# Patient Record
Sex: Male | Born: 1945 | Race: Black or African American | Hispanic: No | Marital: Married | Smoking: Former smoker
Health system: Southern US, Community
[De-identification: ages and names within clinical notes are randomized; demographics above are authoritative.]

## PROBLEM LIST (undated history)

## (undated) DIAGNOSIS — I5189 Other ill-defined heart diseases: Secondary | ICD-10-CM

## (undated) DIAGNOSIS — I4891 Unspecified atrial fibrillation: Secondary | ICD-10-CM

## (undated) DIAGNOSIS — I440 Atrioventricular block, first degree: Secondary | ICD-10-CM

## (undated) DIAGNOSIS — I639 Cerebral infarction, unspecified: Secondary | ICD-10-CM

## (undated) DIAGNOSIS — I10 Essential (primary) hypertension: Secondary | ICD-10-CM

## (undated) DIAGNOSIS — R001 Bradycardia, unspecified: Secondary | ICD-10-CM

## (undated) DIAGNOSIS — I4510 Unspecified right bundle-branch block: Secondary | ICD-10-CM

## (undated) HISTORY — PX: FOOT SURGERY: SHX648

## (undated) HISTORY — PX: COLONOSCOPY: SHX174

## (undated) HISTORY — DX: Cerebral infarction, unspecified: I63.9

## (undated) HISTORY — DX: Other ill-defined heart diseases: I51.89

## (undated) HISTORY — DX: Atrioventricular block, first degree: I44.0

## (undated) HISTORY — DX: Unspecified atrial fibrillation: I48.91

## (undated) HISTORY — DX: Bradycardia, unspecified: R00.1

## (undated) HISTORY — DX: Unspecified right bundle-branch block: I45.10

## (undated) HISTORY — DX: Essential (primary) hypertension: I10

---

## 2002-05-22 ENCOUNTER — Ambulatory Visit (HOSPITAL_COMMUNITY): Admission: RE | Admit: 2002-05-22 | Discharge: 2002-05-22 | Payer: Self-pay | Admitting: Family Medicine

## 2002-05-22 ENCOUNTER — Encounter: Payer: Self-pay | Admitting: Family Medicine

## 2002-05-31 ENCOUNTER — Encounter: Payer: Self-pay | Admitting: Family Medicine

## 2002-05-31 ENCOUNTER — Ambulatory Visit (HOSPITAL_COMMUNITY): Admission: RE | Admit: 2002-05-31 | Discharge: 2002-05-31 | Payer: Self-pay | Admitting: Family Medicine

## 2003-08-28 ENCOUNTER — Ambulatory Visit (HOSPITAL_COMMUNITY): Admission: RE | Admit: 2003-08-28 | Discharge: 2003-08-28 | Payer: Self-pay | Admitting: Internal Medicine

## 2003-09-06 ENCOUNTER — Ambulatory Visit (HOSPITAL_COMMUNITY): Admission: RE | Admit: 2003-09-06 | Discharge: 2003-09-06 | Payer: Self-pay | Admitting: Internal Medicine

## 2003-09-12 ENCOUNTER — Ambulatory Visit (HOSPITAL_COMMUNITY): Admission: RE | Admit: 2003-09-12 | Discharge: 2003-09-12 | Payer: Self-pay | Admitting: Internal Medicine

## 2011-03-03 ENCOUNTER — Other Ambulatory Visit (HOSPITAL_COMMUNITY): Payer: Self-pay | Admitting: Family Medicine

## 2011-03-03 ENCOUNTER — Ambulatory Visit (HOSPITAL_COMMUNITY)
Admission: RE | Admit: 2011-03-03 | Discharge: 2011-03-03 | Disposition: A | Discharge status: Home / Self Care | Payer: BC Managed Care – PPO | Source: Ambulatory Visit | Attending: Family Medicine | Admitting: Family Medicine

## 2011-03-03 DIAGNOSIS — M542 Cervicalgia: Secondary | ICD-10-CM

## 2011-03-06 ENCOUNTER — Other Ambulatory Visit (HOSPITAL_COMMUNITY): Payer: Self-pay | Admitting: Family Medicine

## 2011-03-06 DIAGNOSIS — R200 Anesthesia of skin: Secondary | ICD-10-CM

## 2011-03-09 ENCOUNTER — Ambulatory Visit (HOSPITAL_COMMUNITY)
Admission: RE | Admit: 2011-03-09 | Discharge: 2011-03-09 | Disposition: A | Discharge status: Home / Self Care | Payer: BC Managed Care – PPO | Source: Ambulatory Visit | Attending: Family Medicine | Admitting: Family Medicine

## 2011-03-09 DIAGNOSIS — R209 Unspecified disturbances of skin sensation: Secondary | ICD-10-CM | POA: Insufficient documentation

## 2011-03-09 DIAGNOSIS — M6281 Muscle weakness (generalized): Secondary | ICD-10-CM | POA: Insufficient documentation

## 2011-03-09 DIAGNOSIS — R200 Anesthesia of skin: Secondary | ICD-10-CM

## 2011-03-09 LAB — CREATININE, SERUM
Creatinine, Ser: 1.2 mg/dL (ref 0.50–1.35)
GFR calc Af Amer: >60 mL/min (ref >60)
GFR calc non Af Amer: >60 mL/min (ref >60)

## 2011-03-09 MED ORDER — GADOBENATE DIMEGLUMINE 529 MG/ML IV SOLN
20.0000 mL | Freq: Once | INTRAVENOUS | Status: AC | PRN
  Administered 2011-03-09: 20 mL via INTRAVENOUS

## 2011-09-11 LAB — HEPATIC FUNCTION PANEL: AST: 24 U/L

## 2011-10-12 DIAGNOSIS — I5189 Other ill-defined heart diseases: Secondary | ICD-10-CM

## 2011-10-12 HISTORY — DX: Other ill-defined heart diseases: I51.89

## 2011-10-22 LAB — BASIC METABOLIC PANEL
Chloride: 106 mmol/L
Creat: 0.97
Hemoglobin A - HGBRFX: 97.4
Hemoglobin A2 - HGBRFX: 2.6
Potassium: 4.2 mmol/L

## 2011-10-22 LAB — CBC WITH DIFFERENTIAL/PLATELET
%SAT: 21
TIBC: 289
platelet count: 224

## 2011-12-29 ENCOUNTER — Ambulatory Visit: Payer: BC Managed Care – PPO | Admitting: Internal Medicine

## 2011-12-29 ENCOUNTER — Encounter: Payer: Self-pay | Admitting: Urgent Care

## 2011-12-29 ENCOUNTER — Ambulatory Visit (INDEPENDENT_AMBULATORY_CARE_PROVIDER_SITE_OTHER): Payer: BC Managed Care – PPO | Admitting: Urgent Care

## 2011-12-29 VITALS — BP 128/72 | HR 66 | Temp 98.3°F | Ht 69.0 in | Wt 233.6 lb

## 2011-12-29 DIAGNOSIS — K921 Melena: Secondary | ICD-10-CM

## 2011-12-29 MED ORDER — PEG 3350-KCL-NA BICARB-NACL 420 G PO SOLR: ORAL | Status: AC

## 2011-12-29 NOTE — Patient Instructions (Addendum)
Colonoscopy w/ Dr Jena Gauss To ER if severe bleeding or pain  No Antiinflammatories, or BC powders, Goodys, Ibuprofen, aleve, advil, motrin or aspirin products  Rectal Bleeding Rectal bleeding is when blood passes out of the anus. It is usually a sign that something is wrong. It may not be serious, but it should always be evaluated. Rectal bleeding may present as bright red blood or extremely dark stools. The color may range from dark red or maroon to black (like tar). It is important that the cause of rectal bleeding be identified so treatment can be started and the problem corrected. CAUSES   Hemorrhoids. These are enlarged (dilated) blood vessels or veins in the anal or rectal area.   Fistulas. Theseare abnormal, burrowing channels that usually run from inside the rectum to the skin around the anus. They can bleed.   Anal fissures. This is a tear in the tissue of the anus. Bleeding occurs with bowel movements.   Diverticulosis. This is a condition in which pockets or sacs project from the bowel wall. Occasionally, the sacs can bleed.   Diverticulitis. Thisis an infection involving diverticulosis of the colon.   Proctitis and colitis. These are conditions in which the rectum, colon, or both, can become inflamed and pitted (ulcerated).   Polyps and cancer. Polyps are non-cancerous (benign) growths in the colon that may bleed. Certain types of polyps turn into cancer.   Protrusion of the rectum. Part of the rectum can project from the anus and bleed.   Certain medicines.   Intestinal infections.   Blood vessel abnormalities.  HOME CARE INSTRUCTIONS  Eat a high-fiber diet to keep your stool soft.   Limit activity.   Drink enough fluids to keep your urine clear or pale yellow.   Warm baths may be useful to soothe rectal pain.   Follow up with your caregiver as directed.  SEEK IMMEDIATE MEDICAL CARE IF:  You develop increased bleeding.   You have black or dark red stools.    You vomit blood or material that looks like coffee grounds.   You have abdominal pain or tenderness.   You have a fever.   You feel weak, nauseous, or you faint.   You have severe rectal pain or you are unable to have a bowel movement.  MAKE SURE YOU:  Understand these instructions.   Will watch your condition.   Will get help right away if you are not doing well or get worse.  Document Released: 12/19/2001 Document Revised: 06/18/2011 Document Reviewed: 12/14/2010 [facility] Patient Information 2012 [place], [place].

## 2011-12-29 NOTE — Assessment & Plan Note (Addendum)
Joseph [name] is a pleasant 66 y.o. male with acute onset of abdominal cramps and hematochezia. Symptoms are suspicious for ischemic colitis. He has also been taking anti-inflammatories in the way of ibuprofen the last week and therefore he could have NSAID-induced bleeding as well. Benign anorectal bleeding, diverticular bleeding, colon polyps & carcinoma remain in the differential as well.  Colonoscopy for further evaluation your future with Dr. Jena Gauss.  I have discussed risks & benefits which include, but are not limited to, bleeding, infection, perforation & drug reaction.  The patient agrees with this plan & written consent will be obtained.    CBC now To ER if severe bleeding or pain  No Antiinflammatories, or BC powders, Goodys, Ibuprofen, aleve, advil, motrin or aspirin products

## 2011-12-29 NOTE — Progress Notes (Signed)
Referring Provider: Dr. Thomas Kelly (Cardiologist) Primary Care Physician:  KNOWLTON,STEPHEN D, MD Primary Gastroenterologist:  Dr. Rourk  Chief Complaint  Patient presents with  . Rectal Bleeding    HPI:  Joseph [name] is a 65 y.o. male here as a referral from Dr. Kelly for hematochezia & abdominal pain.   He reports 2 days ago ,he began to have "gas & griping cramps in his stomach".  C/o lots of gas, then he went to the bathroom & noticed a pink tinge to his semi-formed stool.  Yesterday, he looked at tissue after small amt of brown stool & saw a moderate amount of bright red blood on tissue.  He noticed this again this AM.  He had pain in his lower abdomen, grumbling & growling.  4/10 at worst.  Now 0/10.  Pain is intermittent.  Looser BMs earlier, now feels constipated.  He is been taking 200 mg of ibuprofen approximately 8 pills in the last week.   Denies any upper GI symptoms including heartburn, indigestion, nausea, vomiting, dysphagia, odynophagia or anorexia. Last colonoscopy was in 2005 and report is unavailable at this time.  Labs from 10/22/11 show normal iron, TIBC, TIBC, percent saturation, CBC, and ferritin.  TSH is normal.  Past Medical History  Diagnosis Date  . Hypertension     No past surgical history on file.  Current Outpatient Prescriptions  Medication Sig Dispense Refill  . amLODipine (NORVASC) 5 MG tablet Take 5 mg by mouth daily.      . LORazepam (ATIVAN) 1 MG tablet Take 1 mg by mouth every 8 (eight) hours.      . losartan-hydrochlorothiazide (HYZAAR) 50-12.5 MG per tablet Take 1 tablet by mouth daily.      . saw palmetto 160 MG capsule Take 160 mg by mouth 2 (two) times daily.      . tadalafil (CIALIS) 20 MG tablet Take 20 mg by mouth as needed.      . polyethylene glycol-electrolytes (TRILYTE) 420 G solution Use as directed Also buy 1 fleet enema & 4 dulcolax tablets to use as directed  4000 mL  0    Allergies as of 12/29/2011  . (No Known Allergies)     Family history:There is no known family history of colorectal carcinoma , liver disease, or inflammatory bowel disease.  History   Social History  . Marital Status: Single    Spouse Name: N/A    Number of Children: N/A  . Years of Education: N/A   Occupational History  . Not on file.   Social History Main Topics  . Smoking status: Former Smoker -- 0.5 packs/day    Types: Cigarettes  . Smokeless tobacco: Not on file   Comment: quit about 40 yrs ago  . Alcohol Use: Yes     6 pack of beer in a week  . Drug Use: No  . Sexually Active: Not on file  Review of Systems: Gen: Denies any fever, chills, sweats, anorexia, fatigue, weakness, malaise, weight loss, and sleep disorder CV: Denies chest pain, angina, palpitations, syncope, orthopnea, PND, peripheral edema, and claudication. Resp: Denies dyspnea at rest, dyspnea with exercise, cough, sputum, wheezing, coughing up blood, and pleurisy. GI: Denies vomiting blood, jaundice, and fecal incontinence.    GU : Denies urinary burning, blood in urine, urinary frequency, urinary hesitancy, nocturnal urination, and urinary incontinence. MS: Denies joint pain, limitation of movement, and swelling, stiffness, low back pain, extremity pain. Denies muscle weakness, cramps, atrophy.  Derm: Denies rash, itching,   dry skin, hives, moles, warts, or unhealing ulcers.  Psych: Denies depression, anxiety, memory loss, suicidal ideation, hallucinations, paranoia, and confusion. Heme: Denies bruising and enlarged lymph nodes. Neuro:  Denies any headaches, dizziness, paresthesias. Endo:  Denies any problems with DM, thyroid, adrenal function.  Physical Exam: BP 128/72  Pulse 66  Temp 98.3 F (36.8 C) (Temporal)  Ht 5' 9" (1.753 m)  Wt 233 lb 9.6 oz (105.96 kg)  BMI 34.50 kg/m2 General:   Alert,  Well-developed, well-nourished, pleasant and cooperative in NAD. Head:  Normocephalic and atraumatic. Eyes:  Sclera clear, no icterus.   Conjunctiva  pink. Ears:  Normal auditory acuity. Nose:  No deformity, discharge, or lesions. Mouth:  No deformity or lesions,oropharynx pink & moist. Neck:  Supple; no masses or thyromegaly. Lungs:  Clear throughout to auscultation.   No wheezes, crackles, or rhonchi. No acute distress. Heart:  Regular rate and rhythm; no murmurs, clicks, rubs,  or gallops. Abdomen:  Normal bowel sounds.  No bruits.  Soft, non-tender and non-distended without masses, hepatosplenomegaly or hernias noted.  No guarding or rebound tenderness.   Rectal:  No internal or external lesions noted. Msk:  Symmetrical without gross deformities. Normal posture. Pulses:  Normal pulses noted. Extremities:  No clubbing or edema. Neurologic:  Alert and oriented x4;  grossly normal neurologically. Skin:  Intact without significant lesions or rashes. Lymph Nodes:  No significant cervical adenopathy. Psych:  Alert and cooperative. Normal mood and affect.  

## 2011-12-30 ENCOUNTER — Encounter (HOSPITAL_COMMUNITY)
Admission: RE | Disposition: A | Discharge status: Home / Self Care | Payer: Self-pay | Source: Ambulatory Visit | Attending: Internal Medicine

## 2011-12-30 ENCOUNTER — Encounter (HOSPITAL_COMMUNITY): Payer: Self-pay | Admitting: *Deleted

## 2011-12-30 ENCOUNTER — Ambulatory Visit (HOSPITAL_COMMUNITY)
Admission: RE | Admit: 2011-12-30 | Discharge: 2011-12-30 | Disposition: A | Discharge status: Home / Self Care | Payer: BC Managed Care – PPO | Source: Ambulatory Visit | Attending: Internal Medicine | Admitting: Internal Medicine

## 2011-12-30 DIAGNOSIS — R109 Unspecified abdominal pain: Secondary | ICD-10-CM | POA: Insufficient documentation

## 2011-12-30 DIAGNOSIS — K5730 Diverticulosis of large intestine without perforation or abscess without bleeding: Secondary | ICD-10-CM

## 2011-12-30 DIAGNOSIS — K921 Melena: Secondary | ICD-10-CM | POA: Insufficient documentation

## 2011-12-30 DIAGNOSIS — K5289 Other specified noninfective gastroenteritis and colitis: Secondary | ICD-10-CM | POA: Insufficient documentation

## 2011-12-30 DIAGNOSIS — I10 Essential (primary) hypertension: Secondary | ICD-10-CM | POA: Insufficient documentation

## 2011-12-30 DIAGNOSIS — D126 Benign neoplasm of colon, unspecified: Secondary | ICD-10-CM

## 2011-12-30 DIAGNOSIS — Z79899 Other long term (current) drug therapy: Secondary | ICD-10-CM | POA: Insufficient documentation

## 2011-12-30 HISTORY — PX: COLONOSCOPY: SHX5424

## 2011-12-30 LAB — CBC WITH DIFFERENTIAL/PLATELET
Basophils Absolute: 0 10*3/uL (ref 0.0–0.1)
Eosinophils Absolute: 0.2 10*3/uL (ref 0.0–0.7)
Eosinophils Relative: 2 % (ref 0–5)
MCH: 21.2 pg — ABNORMAL LOW (ref 26.0–34.0)
MCHC: 32.6 g/dL (ref 30.0–36.0)
MCV: 65 fL — ABNORMAL LOW (ref 78.0–100.0)
Platelets: 256 10*3/uL (ref 150–400)
RDW: 15.4 % (ref 11.5–15.5)

## 2011-12-30 SURGERY — COLONOSCOPY
Anesthesia: Moderate Sedation

## 2011-12-30 MED ORDER — MEPERIDINE HCL 100 MG/ML IJ SOLN
INTRAMUSCULAR | Status: AC
  Filled 2011-12-30: qty 2

## 2011-12-30 MED ORDER — SODIUM CHLORIDE 0.45 % IV SOLN: Freq: Once | INTRAVENOUS | Status: DC

## 2011-12-30 MED ORDER — MEPERIDINE HCL 100 MG/ML IJ SOLN
INTRAMUSCULAR | Status: DC | PRN
  Administered 2011-12-30: 25 mg via INTRAVENOUS
  Administered 2011-12-30: 50 mg via INTRAVENOUS

## 2011-12-30 MED ORDER — MIDAZOLAM HCL 5 MG/5ML IJ SOLN
INTRAMUSCULAR | Status: AC
  Filled 2011-12-30: qty 10

## 2011-12-30 MED ORDER — MIDAZOLAM HCL 5 MG/5ML IJ SOLN
INTRAMUSCULAR | Status: DC | PRN
  Administered 2011-12-30: 1 mg via INTRAVENOUS
  Administered 2011-12-30 (×2): 2 mg via INTRAVENOUS

## 2011-12-30 NOTE — Discharge Instructions (Addendum)
Colonoscopy Discharge Instructions  Read the instructions outlined below and refer to this sheet in the next few weeks. These discharge instructions provide you with general information on caring for yourself after you leave the hospital. Your doctor may also give you specific instructions. While your treatment has been planned according to the most current medical practices available, unavoidable complications occasionally occur. If you have any problems or questions after discharge, call Dr. Jena Gauss at [phone]. ACTIVITY  You may resume your regular activity, but move at a slower pace for the next 24 hours.   Take frequent rest periods for the next 24 hours.   Walking will help get rid of the air and reduce the bloated feeling in your belly (abdomen).   No driving for 24 hours (because of the medicine (anesthesia) used during the test).    Do not sign any important legal documents or operate any machinery for 24 hours (because of the anesthesia used during the test).  NUTRITION  Drink plenty of fluids.   You may resume your normal diet as instructed by your doctor.   Begin with a light meal and progress to your normal diet. Heavy or fried foods are harder to digest and may make you feel sick to your stomach (nauseated).   Avoid alcoholic beverages for 24 hours or as instructed.  MEDICATIONS  You may resume your normal medications unless your doctor tells you otherwise.  WHAT YOU CAN EXPECT TODAY  Some feelings of bloating in the abdomen.   Passage of more gas than usual.   Spotting of blood in your stool or on the toilet paper.  IF YOU HAD POLYPS REMOVED DURING THE COLONOSCOPY:  No aspirin products for 7 days or as instructed.   No alcohol for 7 days or as instructed.   Eat a soft diet for the next 24 hours.  FINDING OUT THE RESULTS OF YOUR TEST Not all test results are available during your visit. If your test results are not back during the visit, make an appointment  with your caregiver to find out the results. Do not assume everything is normal if you have not heard from your caregiver or the medical facility. It is important for you to follow up on all of your test results.  SEEK IMMEDIATE MEDICAL ATTENTION IF:  You have more than a spotting of blood in your stool.   Your belly is swollen (abdominal distention).   You are nauseated or vomiting.   You have a temperature over 101.   You have abdominal pain or discomfort that is severe or gets worse throughout the day.    Polyp information provided.  You have ischemic colitis. This should resolve without anything else needing to be done.  Diverticulosis information provided.  Further recommendations to follow pending review of pathology report.  Colon Polyps A polyp is extra tissue that grows inside your body. Colon polyps grow in the large intestine. The large intestine, also called the colon, is part of your digestive system. It is a long, hollow tube at the end of your digestive tract where your body makes and stores stool. Most polyps are not dangerous. They are benign. This means they are not cancerous. But over time, some types of polyps can turn into cancer. Polyps that are smaller than a pea are usually not harmful. But larger polyps could someday become or may already be cancerous. To be safe, doctors remove all polyps and test them.  WHO GETS POLYPS? Anyone can get polyps,  but certain people are more likely than others. You may have a greater chance of getting polyps if:  You are over 50.   You have had polyps before.   Someone in your family has had polyps.   Someone in your family has had cancer of the large intestine.   Find out if someone in your family has had polyps. You may also be more likely to get polyps if you:   Eat a lot of fatty foods.   Smoke.   Drink alcohol.   Do not exercise.   Eat too much.  SYMPTOMS  Most small polyps do not cause symptoms. People often  do not know they have one until their caregiver finds it during a regular checkup or while testing them for something else. Some people do have symptoms like these:  Bleeding from the anus. You might notice blood on your underwear or on toilet paper after you have had a bowel movement.   Constipation or diarrhea that lasts more than a week.   Blood in the stool. Blood can make stool look black or it can show up as red streaks in the stool.  If you have any of these symptoms, see your caregiver. HOW DOES THE DOCTOR TEST FOR POLYPS? The doctor can use four tests to check for polyps:  Digital rectal exam. The caregiver wears gloves and checks your rectum (the last part of the large intestine) to see if it feels normal. This test would find polyps only in the rectum. Your caregiver may need to do one of the other tests listed below to find polyps higher up in the intestine.   Barium enema. The caregiver puts a liquid called barium into your rectum before taking x-rays of your large intestine. Barium makes your intestine look white in the pictures. Polyps are dark, so they are easy to see.   Sigmoidoscopy. With this test, the caregiver can see inside your large intestine. A thin flexible tube is placed into your rectum. The device is called a sigmoidoscope, which has a light and a tiny video camera in it. The caregiver uses the sigmoidoscope to look at the last third of your large intestine.   Colonoscopy. This test is like sigmoidoscopy, but the caregiver looks at all of the large intestine. It usually requires sedation. This is the most common method for finding and removing polyps.  TREATMENT   The caregiver will remove the polyp during sigmoidoscopy or colonoscopy. The polyp is then tested for cancer.   If you have had polyps, your caregiver may want you to get tested regularly in the future.  PREVENTION  There is not one sure way to prevent polyps. You might be able to lower your risk of  getting them if you:  Eat more fruits and vegetables and less fatty food.   Do not smoke.   Avoid alcohol.   Exercise every day.   Lose weight if you are overweight.   Eating more calcium and folate can also lower your risk of getting polyps. Some foods that are rich in calcium are milk, cheese, and broccoli. Some foods that are rich in folate are chickpeas, kidney beans, and spinach.   Aspirin might help prevent polyps. Studies are under way.  Document Released: 03/25/2004 Document Revised: 06/18/2011 Document Reviewed: 08/31/2007 [facility] Patient Information 2012 [place], [place].  Diverticulosis Diverticulosis is a common condition that develops when small pouches (diverticula) form in the wall of the colon. The risk of diverticulosis increases with  age. It happens more often in people who eat a low-fiber diet. Most individuals with diverticulosis have no symptoms. Those individuals with symptoms usually experience abdominal pain, constipation, or loose stools (diarrhea). HOME CARE INSTRUCTIONS   Increase the amount of fiber in your diet as directed by your caregiver or dietician. This may reduce symptoms of diverticulosis.   Your caregiver may recommend taking a dietary fiber supplement.   Drink at least 6 to 8 glasses of water each day to prevent constipation.   Try not to strain when you have a bowel movement.   Your caregiver may recommend avoiding nuts and seeds to prevent complications, although this is still an uncertain benefit.   Only take over-the-counter or prescription medicines for pain, discomfort, or fever as directed by your caregiver.  FOODS WITH HIGH FIBER CONTENT INCLUDE:  Fruits. Apple, peach, pear, tangerine, raisins, prunes.   Vegetables. Brussels sprouts, asparagus, broccoli, cabbage, carrot, cauliflower, romaine lettuce, spinach, summer squash, tomato, winter squash, zucchini.   Starchy Vegetables. Baked beans, kidney beans, lima beans, split peas,  lentils, potatoes (with skin).   Grains. Whole wheat bread, brown rice, bran flake cereal, plain oatmeal, white rice, shredded wheat, bran muffins.  SEEK IMMEDIATE MEDICAL CARE IF:   You develop increasing pain or severe bloating.   You have an oral temperature above 102 F (38.9 C), not controlled by medicine.   You develop vomiting or bowel movements that are bloody or black.  Document Released: 03/26/2004 Document Revised: 06/18/2011 Document Reviewed: 11/27/2009 [facility] Patient Information 2012 [place], [place].

## 2011-12-30 NOTE — H&P (View-Only) (Signed)
Referring Provider: Dr. Nicki Guadalajara (Cardiologist) Primary Care Physician:  Milana Obey, MD Primary Gastroenterologist:  Dr. Jena Gauss  Chief Complaint  Patient presents with  . Rectal Bleeding    HPI:  Joseph [name] is a 66 y.o. male here as a referral from Dr. Tresa Endo for hematochezia & abdominal pain.   He reports 2 days ago ,he began to have "gas & griping cramps in his stomach".  C/o lots of gas, then he went to the bathroom & noticed a pink tinge to his semi-formed stool.  Yesterday, he looked at tissue after small amt of brown stool & saw a moderate amount of bright red blood on tissue.  He noticed this again this AM.  He had pain in his lower abdomen, grumbling & growling.  4/10 at worst.  Now 0/10.  Pain is intermittent.  Looser BMs earlier, now feels constipated.  He is been taking 200 mg of ibuprofen approximately 8 pills in the last week.   Denies any upper GI symptoms including heartburn, indigestion, nausea, vomiting, dysphagia, odynophagia or anorexia. Last colonoscopy was in 2005 and report is unavailable at this time.  Labs from 10/22/11 show normal iron, TIBC, TIBC, percent saturation, CBC, and ferritin.  TSH is normal.  Past Medical History  Diagnosis Date  . Hypertension     No past surgical history on file.  Current Outpatient Prescriptions  Medication Sig Dispense Refill  . amLODipine (NORVASC) 5 MG tablet Take 5 mg by mouth daily.      Marland Kitchen LORazepam (ATIVAN) 1 MG tablet Take 1 mg by mouth every 8 (eight) hours.      Marland Kitchen losartan-hydrochlorothiazide (HYZAAR) 50-12.5 MG per tablet Take 1 tablet by mouth daily.      . saw palmetto 160 MG capsule Take 160 mg by mouth 2 (two) times daily.      . tadalafil (CIALIS) 20 MG tablet Take 20 mg by mouth as needed.      . polyethylene glycol-electrolytes (TRILYTE) 420 G solution Use as directed Also buy 1 fleet enema & 4 dulcolax tablets to use as directed  4000 mL  0    Allergies as of 12/29/2011  . (No Known Allergies)     Family history:There is no known family history of colorectal carcinoma , liver disease, or inflammatory bowel disease.  History   Social History  . Marital Status: Single    Spouse Name: N/A    Number of Children: N/A  . Years of Education: N/A   Occupational History  . Not on file.   Social History Main Topics  . Smoking status: Former Smoker -- 0.5 packs/day    Types: Cigarettes  . Smokeless tobacco: Not on file   Comment: quit about 40 yrs ago  . Alcohol Use: Yes     6 pack of beer in a week  . Drug Use: No  . Sexually Active: Not on file  Review of Systems: Gen: Denies any fever, chills, sweats, anorexia, fatigue, weakness, malaise, weight loss, and sleep disorder CV: Denies chest pain, angina, palpitations, syncope, orthopnea, PND, peripheral edema, and claudication. Resp: Denies dyspnea at rest, dyspnea with exercise, cough, sputum, wheezing, coughing up blood, and pleurisy. GI: Denies vomiting blood, jaundice, and fecal incontinence.    GU : Denies urinary burning, blood in urine, urinary frequency, urinary hesitancy, nocturnal urination, and urinary incontinence. MS: Denies joint pain, limitation of movement, and swelling, stiffness, low back pain, extremity pain. Denies muscle weakness, cramps, atrophy.  Derm: Denies rash, itching,  dry skin, hives, moles, warts, or unhealing ulcers.  Psych: Denies depression, anxiety, memory loss, suicidal ideation, hallucinations, paranoia, and confusion. Heme: Denies bruising and enlarged lymph nodes. Neuro:  Denies any headaches, dizziness, paresthesias. Endo:  Denies any problems with DM, thyroid, adrenal function.  Physical Exam: BP 128/72  Pulse 66  Temp 98.3 F (36.8 C) (Temporal)  Ht 5\' 9"  (1.753 m)  Wt 233 lb 9.6 oz (105.96 kg)  BMI 34.50 kg/m2 General:   Alert,  Well-developed, well-nourished, pleasant and cooperative in NAD. Head:  Normocephalic and atraumatic. Eyes:  Sclera clear, no icterus.   Conjunctiva  pink. Ears:  Normal auditory acuity. Nose:  No deformity, discharge, or lesions. Mouth:  No deformity or lesions,oropharynx pink & moist. Neck:  Supple; no masses or thyromegaly. Lungs:  Clear throughout to auscultation.   No wheezes, crackles, or rhonchi. No acute distress. Heart:  Regular rate and rhythm; no murmurs, clicks, rubs,  or gallops. Abdomen:  Normal bowel sounds.  No bruits.  Soft, non-tender and non-distended without masses, hepatosplenomegaly or hernias noted.  No guarding or rebound tenderness.   Rectal:  No internal or external lesions noted. Msk:  Symmetrical without gross deformities. Normal posture. Pulses:  Normal pulses noted. Extremities:  No clubbing or edema. Neurologic:  Alert and oriented x4;  grossly normal neurologically. Skin:  Intact without significant lesions or rashes. Lymph Nodes:  No significant cervical adenopathy. Psych:  Alert and cooperative. Normal mood and affect.

## 2011-12-30 NOTE — Interval H&P Note (Signed)
History and Physical Interval Note:  12/30/2011 2:07 PM  Joseph [name]  has presented today for surgery, with the diagnosis of HEMATOCHEZIA,ABD CRAMPS  The various methods of treatment have been discussed with the patient and family. After consideration of risks, benefits and other options for treatment, the patient has consented to  Procedure(s) (LRB): COLONOSCOPY (N/A) as a surgical intervention .  The patient's history has been reviewed, patient examined, no change in status, stable for surgery.  I have reviewed the patients' chart and labs.  Questions were answered to the patient's satisfaction.     [name]

## 2011-12-30 NOTE — Op Note (Signed)
[facility], [place] [address] [place], [place]  [address]  COLONOSCOPY PROCEDURE REPORT  PATIENT:  Joseph [name], Joseph [name]  MR#:  [id number] BIRTHDATE:  03-14-1946, 65 yrs. old  GENDER:  male ENDOSCOPIST:  R. Roetta Sessions, MD FACP [facility] REF. BY:  Lennette Bihari, M.D. Gareth Morgan, M.D. PROCEDURE DATE:  12/30/2011 PROCEDURE:  Colonoscopy with snare polypectomy and biopsy  INDICATIONS:  Hematochezia- now resolved:  H&H 12.0 and 36.8 yesterday. MCV 65. Iron studies normal in April of this year  INFORMED CONSENT:  The risks, benefits, alternatives and imponderables including but not limited to bleeding, perforation as well as the possibility of a missed lesion have been reviewed. The potential for biopsy, lesion removal, etc. have also been discussed.  Questions have been answered.  All parties agreeable. Please see the history and physical in the medical record for more information.  MEDICATIONS:  Versed 5 mg IV and Demerol 75 mg IV in divided doses.  DESCRIPTION OF PROCEDURE:  After a digital rectal exam was performed, the EC-[id number] ([id number]) colonoscope was advanced from the anus through the rectum and colon to the area of the cecum, ileocecal valve and appendiceal orifice.  The cecum was deeply intubated.  These structures were well-seen and photographed for the record.  From the level of the cecum and ileocecal valve, the scope was slowly and cautiously withdrawn.  The mucosal surfaces were carefully surveyed utilizing scope tip deflection to facilitate fold flattening as needed.  The scope was pulled down into the rectum where a thorough examination including retroflexion was performed. <<PROCEDUREIMAGES>>  FINDINGS: Adequate preparation. Normal rectum. Pancolonic diverticulosis. 20 cm band of markedly inflamed thrombotic ulcerated/eroded colonic mucosa involving the descending segment. See image 7. Remainder of colonic mucosa appeared entirely normal aside from a 6 mm  polyp in the base of the cecum THERAPEUTIC / DIAGNOSTIC MANEUVERS PERFORMED:  The abnormal descending segment was biopsied for histologic study. The cecal polyp was cold snared removed for the pathologist.  COMPLICATIONS:  None  CECAL WITHDRAWAL TIME: None  IMPRESSION:  Ischemic or segmental colitis-status post biopsy. Pancolonic diverticulosis. Cecal polyp-status post polypectomy  RECOMMENDATIONS:  Follow up on pathology. Recent acute illness should be a self-limiting phenomenon. If he has recurrent ischemic colitis,  then a study of his mesenteric vasculature would be need.  ______________________________ R. Roetta Sessions, MD Caleen Essex  CC:  Lennette Bihari, M.D. Gareth Morgan, M.D.  n. eSIGNED:   R. Roetta Sessions at 12/30/2011 02:44 PM  Florina Ou, [phone]

## 2011-12-31 NOTE — Progress Notes (Signed)
Quick Note:  Please call pt & let him know that his blood count is stable but still a bit low. Thanks EA:[id number] D, MD & Dr Tresa Endo  ______

## 2011-12-31 NOTE — Progress Notes (Signed)
Faxed to PCP

## 2012-01-04 ENCOUNTER — Encounter (HOSPITAL_COMMUNITY): Payer: Self-pay | Admitting: Internal Medicine

## 2012-01-05 ENCOUNTER — Encounter: Payer: Self-pay | Admitting: Internal Medicine

## 2012-07-15 ENCOUNTER — Other Ambulatory Visit (HOSPITAL_COMMUNITY): Payer: Self-pay | Admitting: Cardiovascular Disease

## 2012-07-15 DIAGNOSIS — I119 Hypertensive heart disease without heart failure: Secondary | ICD-10-CM

## 2012-07-18 ENCOUNTER — Ambulatory Visit (HOSPITAL_COMMUNITY)
Admission: RE | Admit: 2012-07-18 | Discharge: 2012-07-18 | Disposition: A | Discharge status: Home / Self Care | Payer: BC Managed Care – PPO | Source: Ambulatory Visit | Attending: Cardiovascular Disease | Admitting: Cardiovascular Disease

## 2012-07-18 DIAGNOSIS — I119 Hypertensive heart disease without heart failure: Secondary | ICD-10-CM | POA: Insufficient documentation

## 2012-07-18 NOTE — Progress Notes (Signed)
Renal duplex completed.  

## 2012-08-12 ENCOUNTER — Other Ambulatory Visit (HOSPITAL_COMMUNITY): Payer: Self-pay | Admitting: Cardiovascular Disease

## 2012-08-12 DIAGNOSIS — N281 Cyst of kidney, acquired: Secondary | ICD-10-CM

## 2012-08-16 ENCOUNTER — Ambulatory Visit (HOSPITAL_COMMUNITY)
Admission: RE | Admit: 2012-08-16 | Discharge: 2012-08-16 | Disposition: A | Discharge status: Home / Self Care | Payer: BC Managed Care – PPO | Source: Ambulatory Visit | Attending: Cardiovascular Disease | Admitting: Cardiovascular Disease

## 2012-08-16 ENCOUNTER — Other Ambulatory Visit (HOSPITAL_COMMUNITY): Payer: Self-pay | Admitting: Cardiovascular Disease

## 2012-08-16 DIAGNOSIS — Q619 Cystic kidney disease, unspecified: Secondary | ICD-10-CM | POA: Insufficient documentation

## 2012-08-16 DIAGNOSIS — N281 Cyst of kidney, acquired: Secondary | ICD-10-CM

## 2012-08-16 DIAGNOSIS — Z09 Encounter for follow-up examination after completed treatment for conditions other than malignant neoplasm: Secondary | ICD-10-CM | POA: Insufficient documentation

## 2012-08-16 MED ORDER — GADOBENATE DIMEGLUMINE 529 MG/ML IV SOLN
20.0000 mL | Freq: Once | INTRAVENOUS | Status: AC | PRN
  Administered 2012-08-16: 20 mL via INTRAVENOUS

## 2013-03-31 ENCOUNTER — Encounter: Payer: Self-pay | Admitting: Cardiology

## 2013-03-31 DIAGNOSIS — R001 Bradycardia, unspecified: Secondary | ICD-10-CM | POA: Insufficient documentation

## 2013-03-31 DIAGNOSIS — I5189 Other ill-defined heart diseases: Secondary | ICD-10-CM | POA: Insufficient documentation

## 2013-03-31 DIAGNOSIS — I10 Essential (primary) hypertension: Secondary | ICD-10-CM | POA: Insufficient documentation

## 2013-03-31 DIAGNOSIS — I639 Cerebral infarction, unspecified: Secondary | ICD-10-CM | POA: Insufficient documentation

## 2013-03-31 DIAGNOSIS — I440 Atrioventricular block, first degree: Secondary | ICD-10-CM | POA: Insufficient documentation

## 2013-03-31 DIAGNOSIS — I4510 Unspecified right bundle-branch block: Secondary | ICD-10-CM | POA: Insufficient documentation

## 2013-04-03 ENCOUNTER — Ambulatory Visit: Payer: BC Managed Care – PPO | Admitting: Cardiovascular Disease

## 2013-04-03 ENCOUNTER — Encounter: Payer: Self-pay | Admitting: Cardiovascular Disease

## 2013-04-04 ENCOUNTER — Ambulatory Visit: Payer: BC Managed Care – PPO | Admitting: Cardiovascular Disease

## 2013-04-04 ENCOUNTER — Ambulatory Visit (INDEPENDENT_AMBULATORY_CARE_PROVIDER_SITE_OTHER): Payer: BC Managed Care – PPO | Admitting: Cardiovascular Disease

## 2013-04-04 ENCOUNTER — Encounter: Payer: Self-pay | Admitting: Cardiovascular Disease

## 2013-04-04 VITALS — BP 160/68 | HR 46 | Ht 70.0 in | Wt 224.8 lb

## 2013-04-04 DIAGNOSIS — N281 Cyst of kidney, acquired: Secondary | ICD-10-CM

## 2013-04-04 DIAGNOSIS — I119 Hypertensive heart disease without heart failure: Secondary | ICD-10-CM

## 2013-04-04 DIAGNOSIS — I6350 Cerebral infarction due to unspecified occlusion or stenosis of unspecified cerebral artery: Secondary | ICD-10-CM

## 2013-04-04 DIAGNOSIS — I4510 Unspecified right bundle-branch block: Secondary | ICD-10-CM

## 2013-04-04 DIAGNOSIS — I10 Essential (primary) hypertension: Secondary | ICD-10-CM

## 2013-04-04 DIAGNOSIS — I440 Atrioventricular block, first degree: Secondary | ICD-10-CM

## 2013-04-04 DIAGNOSIS — Q619 Cystic kidney disease, unspecified: Secondary | ICD-10-CM

## 2013-04-04 DIAGNOSIS — I639 Cerebral infarction, unspecified: Secondary | ICD-10-CM

## 2013-04-04 NOTE — Progress Notes (Signed)
Patient ID: TRIG MCBRYAR, male   DOB: 06-05-1946, 67 y.o.   MRN: [id number]     HPI: Joseph [name], is a 67 y.o. male who presents to the office for 6 cardiology evaluation. Joseph [name] is now 89 years old. 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 Has a long-standing history of hypertension. A CT scan has suggested small lacunar infarctions most likely due to hypertension. I initially saw in April 2013 after this CT imaging study was done. He has documented normal systolic function with mild concentric LVH with diastolic dysfunction. His last echo Doppler study was done in April 2013 and at that time his mitral valve E/A ratio is 3.8 suggestive of restrictive physiology per he also has documented renal cysts the left kidney showing a large inferior pole cortical cyst is measured 4.5 x 4.7 x 4.4 cm. In addition there was a second area at the mid pole laterally which appeared multi-septated and measured 2.7 x 2.7 x 1.9. I did recommend that he undergo urologic evaluation which he did at [facility] and he was told that these were benign.  Joseph [name] has lost weight purposely from walking to states his weight had been as high as 250 in the past and now has been reduced to 224. He denies recent chest pain. At times his blood pressure may be in the 140 range when he has checked this appeared he denies significant leg edema but does note some mild swelling around his right knee intermittently for denies chest pressure.  Past Medical History  Diagnosis Date  . Hypertension   . 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 RBBB   . 1St degree AV block   . Sinus bradycardia   . 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 Colonoscopy    . Colonoscopy  12/30/2011    Procedure: COLONOSCOPY;  Surgeon: Corbin Ade, MD;  [facility];  Service: Endoscopy;  Laterality: N/A;  1:45PM    No Known  Allergies  Current Outpatient Prescriptions  Medication Sig Dispense Refill  . amLODipine (NORVASC) 10 MG tablet Take 10 mg by mouth daily.      . diazepam (VALIUM) 5 MG tablet Take 5 mg by mouth as needed for anxiety.      Joseph [name] doxazosin (CARDURA) 4 MG tablet Take 4 mg by mouth at bedtime. Takes 2 tablets      . losartan-hydrochlorothiazide (HYZAAR) 100-25 MG per tablet Take 1 tablet by mouth daily.      . minoxidil (LONITEN) 2.5 MG tablet Take 2.5 mg by mouth daily. Takes 2 tablets      . OVER THE COUNTER MEDICATION Take 1 tablet by mouth 2 (two) times daily. Prastamen      . LORazepam (ATIVAN) 1 MG tablet Take 1 mg by mouth every 8 (eight) hours.      . saw palmetto 160 MG capsule Take 160 mg by mouth 2 (two) times daily.      . tadalafil (CIALIS) 20 MG tablet Take 20 mg by mouth as needed.       No current facility-administered medications for this visit.    History   Social History  . Marital Status: Married    Spouse Name: N/A    Number of Children: N/A  . Years of Education: N/A   Occupational History  . Not on file.   Social History Main Topics  . Smoking  status: Former Smoker -- 0.50 packs/day for 4 years    Types: Cigarettes  . Smokeless tobacco: Not on file     Comment: quit about 40 yrs ago  . Alcohol Use: Yes     Comment: 6 pack of beer in a week  . Drug Use: No  . Sexual Activity: Not on file   Other Topics Concern  . Not on file   Social History Narrative  . No narrative on file    Family History  Problem Relation Age of Onset  . Colon cancer Neg Hx   . Cancer Brother   . CVA Mother   . Hypertension Father     ROS is negative for fevers, chills or night sweats.  He denies headaches. He denies visual symptoms. At times he does note transient facial numbness. He denies wheezing. He denies shortness of breath. There is a chest pressure. He denies change in bowel or bladder habits. He denies blood in the stool or urine. He denies significant ankle edema.  There are no myalgias pretty tells me Dr. Sudie Bailey will be checking laboratory this week Other system review is negative.  PE BP 160/68  Pulse 46  Ht 5\' 10"  (1.778 m)  Wt 224 lb 12.8 oz (101.969 kg)  BMI 32.26 kg/m2  General: Alert, oriented, no distress.  Skin: normal turgor, no rashes HEENT: Normocephalic, atraumatic. Pupils round and reactive; sclera anicteric;no lid lag.  Nose without nasal septal hypertrophy Mouth/Parynx benign; Mallinpatti scale 3 Neck: No JVD, no carotid briuts Lungs: clear to ausculatation and percussion; no wheezing or rales Heart: RRR, s1 s2 normal  Abdomen: No renal bruits. soft, nontender; no hepatosplenomehaly, BS+; abdominal aorta nontender and not dilated by palpation. Pulses 2+ Extremities: no clubbing cyanosis or edema, Homan's sign negative  Neurologic: grossly nonfocal  ECG: Sinus bradycardia with first-degree AV block. Heart rate 46 beats per minute. Right bundle branch block, unchanged.  LABS:  BMET    Component Value Date/Time   NA 141 10/22/2011 1131   K 4.2 10/22/2011 1131   CL 106 10/22/2011 1131   CO2 27 10/22/2011 1131   BUN 18 10/22/2011 1131   CREATININE 0.97 10/22/2011 1131   CREATININE 1.20 03/09/2011 1923   CALCIUM 8.9 10/22/2011 1131   GFRNONAA >60 03/09/2011 1923   GFRAA >60 03/09/2011 1923     Hepatic Function Panel     Component Value Date/Time   AST 24 09/11/2011 1134   ALT 24 09/11/2011 1134   ALKPHOS 97 09/11/2011 1134   BILITOT 0.4 09/11/2011 1134     CBC    Component Value Date/Time   WBC 6.9 12/29/2011 1650   RBC 5.66 12/29/2011 1650   HGB 12.0* 12/29/2011 1650   HCT 36.8* 12/29/2011 1650   HCT 39 10/22/2011 1130   PLT 256 12/29/2011 1650   MCV 65.0* 12/29/2011 1650   MCH 21.2* 12/29/2011 1650   MCHC 32.6 12/29/2011 1650   RDW 15.4 12/29/2011 1650   LYMPHSABS 2.2 12/29/2011 1650   MONOABS 0.5 12/29/2011 1650   EOSABS 0.2 12/29/2011 1650   BASOSABS 0.0 12/29/2011 1650     BNP No results found for this basename: probnp     Lipid Panel  No results found for this basename: chol, trig, hdl, cholhdl, vldl, ldlcalc     RADIOLOGY: No results found.    ASSESSMENT AND PLAN: [name] is now 68 year old African American male who has a history of hypertension and CT evidence for small lacunar infarctions  most likely of hypertensive etiology. There also is a remote history of rheumatic fever age 89. He had developed transient symptoms of right arm weakness with numbness in August 2012 which led to his MRI of his head which showed scattered small white matter hyper intensities bilaterally with also involvement in the right putamen, bilateral thalamus the also he has some additional changes in the pons, right and left inferior cerebellum. Presently, his blood pressure when taken by me was improved at 128/84 in contrast to when he arrived was 160/68. He did not have any orthostatic symptoms. His resting pulse is bradycardic. Presently, he will monitor his B;  if he consistently starts to notice his blood pressures are getting above 140 at that point it may be worthwhile to consider changing his losartan HCT to more potent edarbi chlorthalidone combination. Presently he is on 5 medications for blood pressure control consisting of amlodipine 10 mg, doxazosin 4 mg, Hyzaar 100/25, in addition to his minoxidil 5 mg. He will followup with laboratory with Dr. Sudie Bailey this week. His last echo Doppler study was in April 2013. In April 2015 I'm recommending he undergo a two-year followup echo Doppler study to further evaluate his LVH, systolic as well as diastolic function particularly with his suggestion of restrictive physiology on his last echo Doppler assessment. I'll see him back in the office in May in followup of the above study.     Lennette Bihari, MD, [facility]  04/04/2013 8:50 AM

## 2013-04-04 NOTE — Patient Instructions (Signed)
Your physician has requested that you have an echocardiogram. Echocardiography is a painless test that uses sound waves to create images of your heart. It provides your doctor with information about the size and shape of your heart and how well your heart's chambers and valves are working. This procedure takes approximately one hour. There are no restrictions for this procedure. This will be done in April 2015.  Your physician recommends that you schedule a follow-up appointment in: May 2015.

## 2013-06-29 ENCOUNTER — Other Ambulatory Visit: Payer: Self-pay | Admitting: Cardiovascular Disease

## 2013-06-30 NOTE — Telephone Encounter (Signed)
Rx was sent to pharmacy electronically. 

## 2014-08-19 ENCOUNTER — Other Ambulatory Visit: Payer: Self-pay | Admitting: Cardiovascular Disease

## 2014-08-20 NOTE — Telephone Encounter (Signed)
Rx(s) sent to pharmacy electronically. Message sent to Billie, Dr. Kelly's scheduler, to contact patient for OV 

## 2014-08-21 ENCOUNTER — Telehealth: Payer: Self-pay | Admitting: Cardiovascular Disease

## 2014-08-22 NOTE — Telephone Encounter (Signed)
Closed encounter °

## 2014-10-26 ENCOUNTER — Telehealth: Payer: Self-pay | Admitting: Cardiovascular Disease

## 2014-10-26 ENCOUNTER — Ambulatory Visit (INDEPENDENT_AMBULATORY_CARE_PROVIDER_SITE_OTHER): Payer: BC Managed Care – PPO | Admitting: Cardiovascular Disease

## 2014-10-26 ENCOUNTER — Encounter: Payer: Self-pay | Admitting: Cardiovascular Disease

## 2014-10-26 VITALS — BP 132/76 | HR 54 | Ht 70.0 in | Wt 222.1 lb

## 2014-10-26 DIAGNOSIS — I4510 Unspecified right bundle-branch block: Secondary | ICD-10-CM

## 2014-10-26 DIAGNOSIS — R001 Bradycardia, unspecified: Secondary | ICD-10-CM | POA: Diagnosis not present

## 2014-10-26 DIAGNOSIS — N281 Cyst of kidney, acquired: Secondary | ICD-10-CM

## 2014-10-26 DIAGNOSIS — I5189 Other ill-defined heart diseases: Secondary | ICD-10-CM | POA: Insufficient documentation

## 2014-10-26 DIAGNOSIS — I4430 Unspecified atrioventricular block: Secondary | ICD-10-CM

## 2014-10-26 DIAGNOSIS — I10 Essential (primary) hypertension: Secondary | ICD-10-CM | POA: Diagnosis not present

## 2014-10-26 DIAGNOSIS — Q6100 Congenital renal cyst, unspecified: Secondary | ICD-10-CM

## 2014-10-26 DIAGNOSIS — I519 Heart disease, unspecified: Secondary | ICD-10-CM

## 2014-10-26 NOTE — Telephone Encounter (Signed)
Pt wants to know if you receievd his fax,it was his blood test results.

## 2014-10-26 NOTE — Patient Instructions (Signed)
Your physician has requested that you have an echocardiogram. Echocardiography is a painless test that uses sound waves to create images of your heart. It provides your doctor with information about the size and shape of your heart and how well your heart's chambers and valves are working. This procedure takes approximately one hour. There are no restrictions for this procedure.   Your physician wants you to follow-up in: 1 YEAR OR SOONER IF NEEDED You will receive a reminder letter in the mail two months in advance. If you don't receive a letter, please call our office to schedule the follow-up appointment.

## 2014-10-26 NOTE — Progress Notes (Signed)
Patient ID: Joseph [name], male   DOB: 04/22/1946, 69 y.o.   MRN: [id number]   Primary M.D.: Dr. Steven Knowlton  HPI: Joseph [name] is a 69 y.o. male who presents to the office for an 18 month cardiology evaluation.   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 He has a long-standing history of hypertension. A CT scan in 2013 suggested small lacunar infarctions most likely due to hypertension. I initially saw in April 2013 after this CT imaging study was done. He has documented normal systolic function with mild concentric LVH with diastolic dysfunction. An echo Doppler study in April 2013 revealed his mitral valve E/A ratio is 3.8 suggestive of restrictive physiology. He has documented renal cysts involving the left kidney showing a large inferior pole cortical cyst is measured 4.5 x 4.7 x 4.4 cm. In addition there was a second area at the mid pole laterally which appeared multi-septated and measured 2.7 x 2.7 x 1.9. I  recommended urologic evaluation which he did at [facility] and he was told that these were benign.  [name] has a history of moderate obesity.  In the past.  He had weight as high as 250 pounds.  Over the past several years.  His weight has been in the 220s.  He is walking 3 miles at a time 3 days per week.  He denies any exercise-induced chest pain.  He denies any significant shortness of breath.  He is unaware of any palpitations.  Last week at school.  He had apparently a complete set of laboratory obtained.  He did not bring this with him today.  We'll try to get results of this laboratory to make certain adjustments to his medical therapy did not need to be made.  I have recommended a three-year follow-up echo Doppler study to reassess his previous LV function and particularly prior restrictive physiology to see if this has improved.  He tells me sees Dr. Knowlton for primary care typically sees him at six-month intervals.  Past  Medical History  Diagnosis Date  . Hypertension   . 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 RBBB   . 1St degree AV block   . Sinus bradycardia   . 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 Colonoscopy    . Colonoscopy  12/30/2011    Procedure: COLONOSCOPY;  Surgeon: Robert M Rourk, MD;  Location: [facility];  Service: Endoscopy;  Laterality: N/A;  1:45PM    No Known Allergies  Current Outpatient Prescriptions  Medication Sig Dispense Refill  . amLODipine (NORVASC) 10 MG tablet Take 1 tablet (10 mg total) by mouth daily. <please make appointment for refills> 30 tablet 0  . diazepam (VALIUM) 5 MG tablet Take 5 mg by mouth as needed for anxiety.    . doxazosin (CARDURA) 4 MG tablet Take 4 mg by mouth at bedtime. Takes 2 tablets    . LORazepam (ATIVAN) 1 MG tablet Take 1 mg by mouth every 8 (eight) hours.    . losartan (COZAAR) 100 MG tablet Take 100 mg by mouth daily.    . minoxidil (LONITEN) 2.5 MG tablet Take 2.5 mg by mouth daily. Takes 2 tablets    . OVER THE COUNTER MEDICATION Take 1 tablet by mouth 2 (two) times daily. Prastamen    . saw palmetto 160 MG capsule Take 160 mg by mouth 2 (two) times daily.    .   tadalafil (CIALIS) 20 MG tablet Take 20 mg by mouth as needed.     No current facility-administered medications for this visit.    History   Social History  . Marital Status: Married    Spouse Name: N/A  . Number of Children: N/A  . Years of Education: N/A   Occupational History  . Not on file.   Social History Main Topics  . Smoking status: Former Smoker -- 0.50 packs/day for 4 years    Types: Cigarettes  . Smokeless tobacco: Not on file     Comment: quit about 40 yrs ago  . Alcohol Use: 0.0 oz/week    0 Standard drinks or equivalent per week     Comment: 6 pack of beer in a week  . Drug Use: No  . Sexual Activity: Not on file   Other Topics Concern  . Not on file   Social History Narrative    Family  History  Problem Relation Age of Onset  . Colon cancer Neg Hx   . Cancer Brother   . CVA Mother   . Hypertension Father     ROS General: Negative; No fevers, chills, or night sweats;  HEENT: Negative; No changes in vision or hearing, sinus congestion, difficulty swallowing Pulmonary: Negative; No cough, wheezing, shortness of breath, hemoptysis Cardiovascular: Negative; No chest pain, presyncope, syncope, palpitations GI: Negative; No nausea, vomiting, diarrhea, or abdominal pain GU: History of renal cysts Musculoskeletal: Negative; no myalgias, joint pain, or weakness Hematologic/Oncology: Negative; no easy bruising, bleeding Endocrine: Negative; no heat/cold intolerance; no diabetes Neuro: Negative; no changes in balance, headaches Skin: Negative; No rashes or skin lesions Psychiatric: Negative; No behavioral problems, depression Sleep: Negative; No snoring, daytime sleepiness, hypersomnolence, bruxism, restless legs, hypnogognic hallucinations, no cataplexy Other comprehensive 14 point system review is negative.   PE BP 132/76 mmHg  Pulse 54  Ht 5' 10" (1.778 m)  Wt 222 lb 1.6 oz (100.744 kg)  BMI 31.87 kg/m2  General: Alert, oriented, no distress.  Skin: normal turgor, no rashes HEENT: Normocephalic, atraumatic. Pupils round and reactive; sclera anicteric;no lid lag.  Nose without nasal septal hypertrophy Mouth/Parynx benign; Mallinpatti scale 3 Neck: No JVD, no carotid bruits with normal carotid upstroke Chest wall: Nontender to palpation Lungs: clear to ausculatation and percussion; no wheezing or rales Heart: RRR, s1 s2 normal , 1/6 systolic murmur.  Occasional ectopy.  No rubs thrills or heaves Abdomen: No renal bruits. soft, nontender; no hepatosplenomehaly, BS+; abdominal aorta nontender and not dilated by palpation. Back: No CVA tenderness Pulses 2+ Extremities: no clubbing cyanosis or edema, Homan's sign negative  Neurologic: grossly nonfocal Psychological:  Normal affect and mood  ECG (independently read by me): Sinus bradycardia at 54 bpm with right bundle branch block.  PAC.  QTc interval 479 msec.  PR interval 196 ms.  September 2014 ECG: Sinus bradycardia with first-degree AV block. Heart rate 46 beats per minute. Right bundle branch block, unchanged.  LABS:  BMET  BMP Latest Ref Rng 10/22/2011 03/09/2011  BUN 4 - 21 mg/dL 18 -  Creatinine - 0.97 1.20  Sodium 137 - 147 mmol/L 141 -  Potassium - 4.2 -  Chloride - 106 -  CO2 - 27 -  Calcium - 8.9 -     Hepatic Function Panel   Hepatic Function Latest Ref Rng 09/11/2011  AST - 24  ALT 10 - 40 U/L 24  Alk Phosphatase - 97  Total Bilirubin - 0.4     CBC    CBC Latest Ref Rng 12/29/2011 10/22/2011  WBC 4.0 - 10.5 K/uL 6.9 5.1  Hemoglobin 13.0 - 17.0 g/dL 12.0(L) 12.1(A)  Hematocrit 39.0 - 52.0 % 36.8(L) 39  Platelets 150 - 400 K/uL 256 -   MCV 65 on 12/29/2011  BNP No results found for: PROBNP  Lipid Panel  No results found for: CHOL   RADIOLOGY: No results found.    ASSESSMENT AND PLAN: Mr. Buechler is 68 year old African American male who has a history of hypertension and CT evidence for small lacunar infarctions most likely of hypertensive etiology. There also is a remote history of rheumatic fever age 12. He had developed transient symptoms of right arm weakness with numbness in August 2012 which led to his MRI of his head which showed scattered small white matter hyper intensities bilaterally with also involvement in the right putamen, bilateral thalamus the also he has some additional changes in the pons, right and left inferior cerebellum.  His blood pressure today is fairly well-controlled on multiple medical regimen consisting of amlodipine 10 mg, Doxy Zosyn 8 mg at bedtime, losartan 100 mg daily, and minoxidil 5 mg.  He tells me last week at school.  He had complete set of blood work done.  He is not yet open.  This.  He will fax the results of his lab work to her  office for my review.  He was suppose have an echo Doppler study last year but did not have this done.  3 years ago.  He had restrictive physiology compatible with grade 3 diastolic dysfunction.  I am recommending a follow-up echo Doppler study to reassess systolic, diastolic function and valvular architecture.  I commended him on his weight loss.  However, he still mildly obese with a body mass index of 31.87, grams per meter squared.  I have sent a target weight of 205 pounds all possible, which will get him under the obesity threshold.  He has right bundle branch block, which is stable.  He continues to exercise 3 days per week.  I will contact him regarding his echo results and if additional laboratory is necessary following my review of his lab work done at his school last week.  Of note, in 2013 his MCV was 65.  I suspect this may be compatible with a hemoglobinopathy such as possible thalassemia minor versus sickle cell trait, but I'm not certain if he ever has been evaluated for this.  I will see him in one year for cardiology reevaluation.  He sees Dr. Knowlton at six-month intervals.  Time spent: 25 minutes   Thomas A. Kelly, MD, FACC  10/26/2014 8:44 AM    

## 2014-10-27 ENCOUNTER — Other Ambulatory Visit: Payer: Self-pay | Admitting: Cardiovascular Disease

## 2014-10-29 ENCOUNTER — Other Ambulatory Visit: Payer: Self-pay | Admitting: Cardiovascular Disease

## 2014-10-29 NOTE — Telephone Encounter (Signed)
Rx has been sent to the pharmacy electronically. ° °

## 2014-10-30 ENCOUNTER — Encounter: Payer: Self-pay | Admitting: Cardiovascular Disease

## 2014-11-07 ENCOUNTER — Ambulatory Visit (HOSPITAL_COMMUNITY)
Admission: RE | Admit: 2014-11-07 | Discharge: 2014-11-07 | Disposition: A | Discharge status: Home / Self Care | Payer: BC Managed Care – PPO | Source: Ambulatory Visit | Attending: Cardiology | Admitting: Cardiology

## 2014-11-07 DIAGNOSIS — I10 Essential (primary) hypertension: Secondary | ICD-10-CM | POA: Diagnosis not present

## 2014-11-07 DIAGNOSIS — I4430 Unspecified atrioventricular block: Secondary | ICD-10-CM | POA: Insufficient documentation

## 2014-11-07 NOTE — Progress Notes (Signed)
2D Echo Performed 11/07/2014    Marygrace Drought, RCS

## 2014-11-21 ENCOUNTER — Telehealth: Payer: Self-pay | Admitting: *Deleted

## 2014-11-21 ENCOUNTER — Encounter: Payer: Self-pay | Admitting: *Deleted

## 2014-11-21 NOTE — Telephone Encounter (Signed)
Opened in error

## 2015-06-13 DIAGNOSIS — I4891 Unspecified atrial fibrillation: Secondary | ICD-10-CM

## 2015-06-13 HISTORY — DX: Unspecified atrial fibrillation: I48.91

## 2015-06-27 ENCOUNTER — Encounter (HOSPITAL_COMMUNITY): Payer: Self-pay

## 2015-06-27 ENCOUNTER — Emergency Department (HOSPITAL_COMMUNITY): Payer: Medicare Other

## 2015-06-27 ENCOUNTER — Observation Stay (HOSPITAL_COMMUNITY)
Admission: EM | Admit: 2015-06-27 | Discharge: 2015-06-29 | Disposition: A | Discharge status: Home / Self Care | Payer: Medicare Other | Attending: Internal Medicine | Admitting: Internal Medicine

## 2015-06-27 DIAGNOSIS — Z79899 Other long term (current) drug therapy: Secondary | ICD-10-CM | POA: Diagnosis not present

## 2015-06-27 DIAGNOSIS — N400 Enlarged prostate without lower urinary tract symptoms: Secondary | ICD-10-CM | POA: Diagnosis not present

## 2015-06-27 DIAGNOSIS — N179 Acute kidney failure, unspecified: Secondary | ICD-10-CM | POA: Insufficient documentation

## 2015-06-27 DIAGNOSIS — R001 Bradycardia, unspecified: Secondary | ICD-10-CM | POA: Diagnosis not present

## 2015-06-27 DIAGNOSIS — R7989 Other specified abnormal findings of blood chemistry: Secondary | ICD-10-CM

## 2015-06-27 DIAGNOSIS — I10 Essential (primary) hypertension: Secondary | ICD-10-CM | POA: Diagnosis present

## 2015-06-27 DIAGNOSIS — R55 Syncope and collapse: Secondary | ICD-10-CM | POA: Diagnosis present

## 2015-06-27 DIAGNOSIS — I4891 Unspecified atrial fibrillation: Secondary | ICD-10-CM | POA: Insufficient documentation

## 2015-06-27 DIAGNOSIS — I519 Heart disease, unspecified: Secondary | ICD-10-CM | POA: Diagnosis present

## 2015-06-27 DIAGNOSIS — Z87891 Personal history of nicotine dependence: Secondary | ICD-10-CM | POA: Diagnosis not present

## 2015-06-27 DIAGNOSIS — Z8249 Family history of ischemic heart disease and other diseases of the circulatory system: Secondary | ICD-10-CM | POA: Insufficient documentation

## 2015-06-27 DIAGNOSIS — I5189 Other ill-defined heart diseases: Secondary | ICD-10-CM | POA: Diagnosis present

## 2015-06-27 DIAGNOSIS — I482 Chronic atrial fibrillation, unspecified: Secondary | ICD-10-CM | POA: Diagnosis present

## 2015-06-27 DIAGNOSIS — R778 Other specified abnormalities of plasma proteins: Secondary | ICD-10-CM

## 2015-06-27 DIAGNOSIS — Z8673 Personal history of transient ischemic attack (TIA), and cerebral infarction without residual deficits: Secondary | ICD-10-CM | POA: Insufficient documentation

## 2015-06-27 DIAGNOSIS — I099 Rheumatic heart disease, unspecified: Secondary | ICD-10-CM | POA: Diagnosis not present

## 2015-06-27 HISTORY — DX: Unspecified atrial fibrillation: I48.91

## 2015-06-27 LAB — CBC
HEMATOCRIT: 40.7 % (ref 39.0–52.0)
HEMOGLOBIN: 13.4 g/dL (ref 13.0–17.0)
MCH: 22.5 pg — AB (ref 26.0–34.0)
MCHC: 32.9 g/dL (ref 30.0–36.0)
MCV: 68.3 fL — ABNORMAL LOW (ref 78.0–100.0)
Platelets: 189 10*3/uL (ref 150–400)
RBC: 5.96 MIL/uL — ABNORMAL HIGH (ref 4.22–5.81)
RDW: 14.9 % (ref 11.5–15.5)
WBC: 5.3 10*3/uL (ref 4.0–10.5)

## 2015-06-27 LAB — BASIC METABOLIC PANEL
ANION GAP: 11 (ref 5–15)
BUN: 21 mg/dL — AB (ref 6–20)
CO2: 24 mmol/L (ref 22–32)
Calcium: 9.2 mg/dL (ref 8.9–10.3)
Chloride: 105 mmol/L (ref 101–111)
Creatinine, Ser: 1.41 mg/dL — ABNORMAL HIGH (ref 0.61–1.24)
GFR calc Af Amer: 58 mL/min — ABNORMAL LOW (ref >60)
GFR calc non Af Amer: 50 mL/min — ABNORMAL LOW (ref >60)
GLUCOSE: 96 mg/dL (ref 65–99)
POTASSIUM: 4.3 mmol/L (ref 3.5–5.1)
Sodium: 140 mmol/L (ref 135–145)

## 2015-06-27 LAB — I-STAT TROPONIN, ED: Troponin i, poc: 0.02 ng/mL (ref 0.00–0.08)

## 2015-06-27 LAB — CBG MONITORING, ED: Glucose-Capillary: 89 mg/dL (ref 65–99)

## 2015-06-27 MED ORDER — SODIUM CHLORIDE 0.9 % IV BOLUS (SEPSIS)
1000.0000 mL | Freq: Once | INTRAVENOUS | Status: AC
  Administered 2015-06-27: 1000 mL via INTRAVENOUS

## 2015-06-27 MED ORDER — DILTIAZEM HCL 100 MG IV SOLR
5.0000 mg/h | Freq: Once | INTRAVENOUS | Status: AC
  Administered 2015-06-27: 5 mg/h via INTRAVENOUS
  Filled 2015-06-27: qty 100

## 2015-06-27 NOTE — ED Notes (Signed)
Pt called out, Pt states that he is dizzy, nauseous and pt appears to be diaphoretic. Pt states "I want to eat something" Dr Canary Brim in room and ordered that the pt could eat.

## 2015-06-27 NOTE — ED Provider Notes (Addendum)
CSN: [id number]     Arrival date & time 06/27/15  2034 History   First MD Initiated Contact with Patient 06/27/15 2049     Chief Complaint  Patient presents with  . Atrial Fibrillation  . Near Syncope     (Consider location/radiation/quality/duration/timing/severity/associated sxs/prior Treatment) HPI  Pt presents due to feeling of generalized weakness and near syncope.  He was working on some cabinets in his home- was standing on a step ladder when symptoms began.  He felt hot and devleoped a cold sweat.  No chest pain or palpitations.  No syncope.  No difficulty breathing.  He got down off the step ladder.  On EMS arrival they stated he was clammy.  He states he feels improved at the time of ED evaluation.  Has not had vomiting or diarrhea.  States he did not have as much to eat and drink today as usual since he was working on the cabinet.  There are no other associated systemic symptoms, there are no other alleviating or modifying factors.   Past Medical History  Diagnosis Date  . Hypertension   . 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 RBBB   . 1St degree AV block   . Sinus bradycardia   . Diastolic dysfunction 123456    grade 1   Past Surgical History  Procedure Laterality Date  . Colonoscopy    . Colonoscopy  12/30/2011    Procedure: COLONOSCOPY;  Surgeon: [name], MD;  [facility]: [facility];  Service: Endoscopy;  Laterality: N/A;  1:45PM   Family History  Problem Relation Age of Onset  . Colon cancer Neg Hx   . Cancer Brother   . CVA Mother   . Hypertension Father    Social History  Substance Use Topics  . Smoking status: Former Smoker -- 0.00 packs/day for 4 years  . Smokeless tobacco: None     Comment: quit about 40 yrs ago  . Alcohol Use: 0.0 oz/week    0 Standard drinks or equivalent per week     Comment: 6 pack of beer in a week    Review of Systems  ROS reviewed and all otherwise negative except for mentioned in HPI    Allergies  Review of patient's  allergies indicates no known allergies.  Home Medications   Prior to Admission medications   Medication Sig Start Date End Date Taking? Authorizing Provider  amLODipine (NORVASC) 10 MG tablet TAKE 1 TABLET BY MOUTH DAILY 10/29/14  Yes Troy Sine, MD  doxazosin (CARDURA) 4 MG tablet Take 8 mg by mouth at bedtime.    Yes Historical Provider, MD  losartan (COZAAR) 100 MG tablet Take 100 mg by mouth daily.   Yes Historical Provider, MD  minoxidil (LONITEN) 2.5 MG tablet Take 5 mg by mouth daily.    Yes Historical Provider, MD  saw palmetto 160 MG capsule Take 160 mg by mouth 2 (two) times daily.   Yes Historical Provider, MD   BP 142/74 mmHg  Pulse 49  Temp(Src) 98.7 F (37.1 C) (Oral)  Resp 18  Ht 5\' 9"  (1.753 m)  Wt 223 lb 9.6 oz (101.424 kg)  BMI 33.00 kg/m2  SpO2 97%  Vitals reviewed Physical Exam  Physical Examination: General appearance - alert, well appearing, and in no distress Mental status - alert, oriented to person, place, and time Eyes - PERRL, no scleral icterus Mouth - mucous membranes moist, pharynx normal without lesions Chest - clear to auscultation, no wheezes, rales or  rhonchi, symmetric air entry Heart - normal rate, regular rhythm, normal S1, S2, no murmurs, rubs, clicks or gallops Abdomen - soft, nontender, nondistended, no masses or organomegaly Neurological - alert, oriented, normal speech, cranial nerves grossly intact, strength 5/5 in extremities x 4, sensation intact Extremities - peripheral pulses normal, no pedal edema, no clubbing or cyanosis Skin - normal coloration and turgor, no rashes  ED Course  Procedures (including critical care time)  CRITICAL CARE Performed by: Threasa Beards Total critical care time: 45 minutes Critical care time was exclusive of separately billable procedures and treating other patients. Critical care was necessary to treat or prevent imminent or life-threatening deterioration. Critical care was time spent personally  by me on the following activities: development of treatment plan with patient and/or surrogate as well as nursing, discussions with consultants, evaluation of patient's response to treatment, examination of patient, obtaining history from patient or surrogate, ordering and performing treatments and interventions, ordering and review of laboratory studies, ordering and review of radiographic studies, pulse oximetry and re-evaluation of patient's condition. Labs Review Labs Reviewed  BASIC METABOLIC PANEL - Abnormal; Notable for the following:    BUN 21 (*)    Creatinine, Ser 1.41 (*)    GFR calc non Af Amer 50 (*)    GFR calc Af Amer 58 (*)    All other components within normal limits  CBC - Abnormal; Notable for the following:    RBC 5.96 (*)    MCV 68.3 (*)    MCH 22.5 (*)    All other components within normal limits  TROPONIN I - Abnormal; Notable for the following:    Troponin I 0.21 (*)    All other components within normal limits  TROPONIN I - Abnormal; Notable for the following:    Troponin I 0.26 (*)    All other components within normal limits  TROPONIN I - Abnormal; Notable for the following:    Troponin I 0.18 (*)    All other components within normal limits  COMPREHENSIVE METABOLIC PANEL - Abnormal; Notable for the following:    Glucose, Bld 110 (*)    Calcium 8.2 (*)    Total Protein 5.3 (*)    Albumin 3.0 (*)    All other components within normal limits  CBC WITH DIFFERENTIAL/PLATELET - Abnormal; Notable for the following:    Hemoglobin 11.4 (*)    HCT 35.2 (*)    MCV 68.6 (*)    MCH 22.2 (*)    All other components within normal limits  MRSA PCR SCREENING  TSH  T4, FREE  HEPARIN LEVEL (UNFRACTIONATED)  URINE RAPID DRUG SCREEN, HOSP PERFORMED  HEPARIN LEVEL (UNFRACTIONATED)  T3, FREE  CBG MONITORING, ED  I-STAT TROPOININ, ED    Imaging Review Dg Chest 2 View  06/27/2015  CLINICAL DATA:  Weakness and tachycardia today. Hypertension. Nonsmoker. EXAM: CHEST   2 VIEW COMPARISON:  None. FINDINGS: Low lung volumes. Normal heart size and pulmonary vascularity. No focal airspace disease or consolidation in the lungs. No blunting of costophrenic angles. No pneumothorax. Mediastinal contours appear intact. Degenerative changes in the spine. IMPRESSION: No active cardiopulmonary disease. Electronically Signed   By: Lucienne Capers M.D.   On: 06/27/2015 22:46   I have personally reviewed and evaluated these images and lab results as part of my medical decision-making.   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  Maintaining blood pressure.  RBBB is old no other new ekg changes.  Troponin negative.    10:39 PM d/w Dr. Radford Pax, cardiology, she recommends IV diltiazem overnight, admit to hospitalist service.  Then tomorrow can switch his po norvasc to po diltiazem for rate control.    11:17 PM d/w Dr. Hal Hope, triad for admission.  Pt agreeable with plan.   Alfonzo Beers, MD 06/28/15 [place], MD 06/28/15 [phone]

## 2015-06-27 NOTE — ED Notes (Addendum)
Per EMS, called out for sycnopal episode. Pt alert and oriented x 4 upon ems arrival, pt stated that he "i don't feel right" pt became lethargic, clammy, diaphoretic and lasted for 10 minutes. Initial BP 90/60. 12 lead shows fast a-fib and Right BBB. Pt states he has no cardiac hx of than htn. Pt states that he took his BP medication earlier than he normally does. Pt has been alert and oriented x 4 since episode and is on arrival to the ED. Pt has no complaints of pain, just minor dizziness. EMS reported that pt's HR was very erratic with rate fluctuating from 50s to 150s

## 2015-06-28 ENCOUNTER — Encounter (HOSPITAL_COMMUNITY): Payer: Self-pay | Admitting: Internal Medicine

## 2015-06-28 ENCOUNTER — Inpatient Hospital Stay (HOSPITAL_COMMUNITY): Payer: Medicare Other

## 2015-06-28 DIAGNOSIS — R55 Syncope and collapse: Secondary | ICD-10-CM | POA: Diagnosis present

## 2015-06-28 DIAGNOSIS — R42 Dizziness and giddiness: Secondary | ICD-10-CM

## 2015-06-28 DIAGNOSIS — I4891 Unspecified atrial fibrillation: Secondary | ICD-10-CM

## 2015-06-28 DIAGNOSIS — R718 Other abnormality of red blood cells: Secondary | ICD-10-CM

## 2015-06-28 DIAGNOSIS — I10 Essential (primary) hypertension: Secondary | ICD-10-CM

## 2015-06-28 LAB — CBC WITH DIFFERENTIAL/PLATELET
Basophils Absolute: 0 K/uL (ref 0.0–0.1)
Basophils Relative: 0 %
Eosinophils Absolute: 0.1 K/uL (ref 0.0–0.7)
Eosinophils Relative: 2 %
HCT: 35.2 % — ABNORMAL LOW (ref 39.0–52.0)
Hemoglobin: 11.4 g/dL — ABNORMAL LOW (ref 13.0–17.0)
Lymphocytes Relative: 30 %
Lymphs Abs: 2 K/uL (ref 0.7–4.0)
MCH: 22.2 pg — ABNORMAL LOW (ref 26.0–34.0)
MCHC: 32.4 g/dL (ref 30.0–36.0)
MCV: 68.6 fL — ABNORMAL LOW (ref 78.0–100.0)
Monocytes Absolute: 0.4 K/uL (ref 0.1–1.0)
Monocytes Relative: 6 %
Neutro Abs: 4.2 K/uL (ref 1.7–7.7)
Neutrophils Relative %: 62 %
Platelets: 185 K/uL (ref 150–400)
RBC: 5.13 MIL/uL (ref 4.22–5.81)
RDW: 15.1 % (ref 11.5–15.5)
WBC: 6.7 K/uL (ref 4.0–10.5)

## 2015-06-28 LAB — COMPREHENSIVE METABOLIC PANEL
ALBUMIN: 3 g/dL — AB (ref 3.5–5.0)
ALT: 18 U/L (ref 17–63)
AST: 22 U/L (ref 15–41)
Alkaline Phosphatase: 69 U/L (ref 38–126)
Anion gap: 6 (ref 5–15)
BILIRUBIN TOTAL: 1.1 mg/dL (ref 0.3–1.2)
BUN: 19 mg/dL (ref 6–20)
CO2: 24 mmol/L (ref 22–32)
CREATININE: 1.15 mg/dL (ref 0.61–1.24)
Calcium: 8.2 mg/dL — ABNORMAL LOW (ref 8.9–10.3)
Chloride: 108 mmol/L (ref 101–111)
GFR calc Af Amer: >60 mL/min (ref >60)
GLUCOSE: 110 mg/dL — AB (ref 65–99)
POTASSIUM: 3.6 mmol/L (ref 3.5–5.1)
Sodium: 138 mmol/L (ref 135–145)
TOTAL PROTEIN: 5.3 g/dL — AB (ref 6.5–8.1)

## 2015-06-28 LAB — TROPONIN I
TROPONIN I: 0.21 ng/mL — AB (ref <0.031)
TROPONIN I: 0.26 ng/mL — AB (ref <0.031)
Troponin I: 0.18 ng/mL — ABNORMAL HIGH (ref <0.031)

## 2015-06-28 LAB — T4, FREE: Free T4: 1 ng/dL (ref 0.61–1.12)

## 2015-06-28 LAB — HEPARIN LEVEL (UNFRACTIONATED)
HEPARIN UNFRACTIONATED: 0.51 [IU]/mL (ref 0.30–0.70)
Heparin Unfractionated: 0.54 [IU]/mL (ref 0.30–0.70)

## 2015-06-28 LAB — MRSA PCR SCREENING: MRSA by PCR: NEGATIVE

## 2015-06-28 LAB — RAPID URINE DRUG SCREEN, HOSP PERFORMED
AMPHETAMINES: NOT DETECTED
BARBITURATES: NOT DETECTED
Benzodiazepines: NOT DETECTED
Cocaine: NOT DETECTED
Opiates: NOT DETECTED
TETRAHYDROCANNABINOL: NOT DETECTED

## 2015-06-28 LAB — TSH: TSH: 0.696 u[IU]/mL (ref 0.350–4.500)

## 2015-06-28 MED ORDER — ONDANSETRON HCL 4 MG PO TABS: 4.0000 mg | ORAL_TABLET | Freq: Four times a day (QID) | ORAL | Status: DC | PRN

## 2015-06-28 MED ORDER — DOXAZOSIN MESYLATE 4 MG PO TABS
8.0000 mg | ORAL_TABLET | Freq: Every day | ORAL | Status: DC
  Administered 2015-06-28: 8 mg via ORAL
  Filled 2015-06-28 (×2): qty 2

## 2015-06-28 MED ORDER — HEPARIN BOLUS VIA INFUSION
4000.0000 [IU] | Freq: Once | INTRAVENOUS | Status: AC
  Administered 2015-06-28: 4000 [IU] via INTRAVENOUS
  Filled 2015-06-28: qty 4000

## 2015-06-28 MED ORDER — MINOXIDIL 2.5 MG PO TABS
5.0000 mg | ORAL_TABLET | Freq: Every day | ORAL | Status: DC
  Administered 2015-06-28 – 2015-06-29 (×2): 5 mg via ORAL
  Filled 2015-06-28 (×2): qty 2

## 2015-06-28 MED ORDER — ACETAMINOPHEN 650 MG RE SUPP: 650.0000 mg | Freq: Four times a day (QID) | RECTAL | Status: DC | PRN

## 2015-06-28 MED ORDER — ACETAMINOPHEN 325 MG PO TABS: 650.0000 mg | ORAL_TABLET | Freq: Four times a day (QID) | ORAL | Status: DC | PRN

## 2015-06-28 MED ORDER — SODIUM CHLORIDE 0.9 % IV SOLN
INTRAVENOUS | Status: DC
  Administered 2015-06-28: 02:00:00 via INTRAVENOUS

## 2015-06-28 MED ORDER — ONDANSETRON HCL 4 MG/2ML IJ SOLN: 4.0000 mg | Freq: Four times a day (QID) | INTRAMUSCULAR | Status: DC | PRN

## 2015-06-28 MED ORDER — LOSARTAN POTASSIUM 50 MG PO TABS
100.0000 mg | ORAL_TABLET | Freq: Every day | ORAL | Status: DC
  Administered 2015-06-28 – 2015-06-29 (×2): 100 mg via ORAL
  Filled 2015-06-28 (×2): qty 2

## 2015-06-28 MED ORDER — HEPARIN (PORCINE) IN NACL 100-0.45 UNIT/ML-% IJ SOLN
1400.0000 [IU]/h | INTRAMUSCULAR | Status: AC
  Administered 2015-06-28 (×2): 1400 [IU]/h via INTRAVENOUS
  Filled 2015-06-28 (×3): qty 250

## 2015-06-28 NOTE — Consult Note (Signed)
CARDIOLOGY CONSULT NOTE   Patient ID: Joseph [name] MRN: [id number] DOB/AGE: April 05, 1946 69 y.o.  Admit date: 06/27/2015  Primary Physician   Robert Bellow, MD Primary Cardiologist   Dr. Claiborne Billings Reason for Consultation   afib  Referring physician Louellen Molder, MD  HPI: Joseph [name] is a 69 y.o. male with a history of HTN, ?TIA (A CT scan in 2013 suggested small lacunar infarctions most likely due to hypertension), who presented last night for evaluation of near syncope episode.  He has documented normal systolic function with mild concentric LVH with diastolic dysfunction. An echo Doppler study in April 2013 revealed his mitral valve E/A ratio is 3.8 suggestive of restrictive physiology. He has documented renal cysts involving the left kidney showing a large inferior pole cortical cyst is measured 4.5 x 4.7 x 4.4 cm. In addition there was a second area at the mid pole laterally which appeared multi-septated and measured 2.7 x 2.7 x 1.9.  Last echocardiogram 11/07/2014 showed normal LV size with mild LV hypertrophy. EF 55-60%. Moderate diastolic dysfunction. Normal RV size and systolic function. PA peak pressure: 30 mm Hg (S).  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 No fall or injury. His son helped him to lay down, however, he continues to felt dizzy and EMS was called. He was found to have A. fib with RVR. He was started on Cardizem infusion however discontinued due to bradycardia. Reviewing telemetry indicates that patient was in sinus rhythm initially when he came to the ED. Then went to A. fib RVR around 11 pm and converted to sinus rhythm about midnight. Initially his rate was in the mid 40s then gradually increased to 50s now with transient rate of 70s and PACs. He usually takes his Cardura 11 PM, however, he took it yesterday around 3 PM before going to work. The  patient denies nausea, vomiting, fever, chest pain, palpitations, shortness of breath, orthopnea, PND, syncope, cough, congestion, abdominal pain, hematochezia, melena, lower extremity edema.  EKG shows A. fib with RVR and right bundle branch block, LAFB, PVCs. Troponin trend 0.21->0.26->0.18. TSH normal. Chest x-ray without acute abnormality. Echocardiogram has been done/pending reading.  Past Medical History  Diagnosis Date  . Hypertension   . 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 RBBB   . 1St degree AV block   . Sinus bradycardia   . Diastolic dysfunction 123456    grade 1     Past Surgical History  Procedure Laterality Date  . Colonoscopy    . Colonoscopy  12/30/2011    Procedure: COLONOSCOPY;  Surgeon: [name], MD;  Location: [facility];  Service: Endoscopy;  Laterality: N/A;  1:45PM    No Known Allergies  I have reviewed the patient's current medications . losartan  100 mg Oral Daily  . minoxidil  5 mg Oral Daily   . sodium chloride 10 mL/hr at 06/28/15 0130  . heparin 1,400 Units/hr (06/28/15 0205)   acetaminophen **OR** acetaminophen, ondansetron **OR** ondansetron (ZOFRAN) IV  Prior to Admission medications   Medication Sig Start Date End Date Taking? Authorizing Provider  amLODipine (NORVASC) 10 MG tablet TAKE 1 TABLET BY MOUTH DAILY 10/29/14  Yes Troy Sine, MD  doxazosin (CARDURA) 4 MG tablet Take 8 mg by mouth at bedtime.    Yes Historical Provider, MD  losartan (COZAAR) 100 MG tablet Take 100 mg  by mouth daily.   Yes Historical Provider, MD  minoxidil (LONITEN) 2.5 MG tablet Take 5 mg by mouth daily.    Yes Historical Provider, MD  saw palmetto 160 MG capsule Take 160 mg by mouth 2 (two) times daily.   Yes Historical Provider, MD     Social History   Social History  . Marital Status: Married    Spouse Name: N/A  . Number of Children: N/A  . Years of Education: N/A   Occupational History  . Not on file.   Social History Main Topics  . Smoking  status: Former Smoker -- 0.00 packs/day for 4 years  . Smokeless tobacco: Not on file     Comment: quit about 40 yrs ago  . Alcohol Use: 0.0 oz/week    0 Standard drinks or equivalent per week     Comment: 6 pack of beer in a week  . Drug Use: No  . Sexual Activity: Not on file   Other Topics Concern  . Not on file   Social History Narrative    Family Status  Relation Status Death Age  . Brother Deceased   . Mother Deceased   . Father Deceased   . Sister Alive   . Brother Deceased   . Brother Deceased   . Brother Alive   . Brother Alive   . Brother Alive   . Brother Alive    Family History  Problem Relation Age of Onset  . Colon cancer Neg Hx   . Cancer Brother   . CVA Mother   . Hypertension Father      ROS:  Full 14 point review of systems complete and found to be negative unless listed above.  Physical Exam: Blood pressure 142/74, pulse 49, temperature 98.7 F (37.1 C), temperature source Oral, resp. rate 18, height 5\' 9"  (1.753 m), weight 223 lb 9.6 oz (101.424 kg), SpO2 97 %.  General: Well developed, well nourished, male in no acute distress Head: Eyes PERRLA, No xanthomas. Normocephalic and atraumatic, oropharynx without edema or exudate.  Lungs: Resp regular and unlabored, CTA. Heart: RRR no s3, s4, or murmurs..   Neck: No carotid bruits. No lymphadenopathy. No JVD. Abdomen: Bowel sounds present, abdomen soft and non-tender without masses or hernias noted. Msk:  No spine or cva tenderness. No weakness, no joint deformities or effusions. Extremities: No clubbing, cyanosis or edema. DP/PT/Radials 2+ and equal bilaterally. Neuro: Alert and oriented X 3. No focal deficits noted. Psych:  Good affect, responds appropriately Skin: No rashes or lesions noted.  Labs:   Lab Results  Component Value Date   WBC 6.7 06/28/2015   HGB 11.4* 06/28/2015   HCT 35.2* 06/28/2015   MCV 68.6* 06/28/2015   PLT 185 06/28/2015   No results for input(s): INR in the last 72  hours.  Recent Labs Lab 06/28/15 0715  NA 138  K 3.6  CL 108  CO2 24  BUN 19  CREATININE 1.15  CALCIUM 8.2*  PROT 5.3*  BILITOT 1.1  ALKPHOS 69  ALT 18  AST 22  GLUCOSE 110*  ALBUMIN 3.0*   No results found for: MG  Recent Labs  06/28/15 0316 06/28/15 0715 06/28/15 1318  TROPONINI 0.21* 0.26* 0.18*    Recent Labs  06/27/15 2110  TROPIPOC 0.02    Echo: 11/07/2014 LV EF: 55% -  60%  ------------------------------------------------------------------- Indications:   Hypertension - benign 401.1.  ------------------------------------------------------------------- History:  PMH: CVA, Rheumatic fever.  ------------------------------------------------------------------- Study Conclusions  -  Left ventricle: The cavity size was normal. Wall thickness was increased in a pattern of mild LVH. Systolic function was normal. The estimated ejection fraction was in the range of 55% to 60%. Although no diagnostic regional wall motion abnormality was identified, this possibility cannot be completely excluded on the basis of this study. Features are consistent with a pseudonormal left ventricular filling pattern, with concomitant abnormal relaxation and increased filling pressure (grade 2 diastolic dysfunction). - Aortic valve: There was no stenosis. - Mitral valve: Mildly calcified annulus. There was no significant regurgitation. - Left atrium: The atrium was mildly dilated. - Right ventricle: The cavity size was normal. Systolic function was normal. - Right atrium: The atrium was mildly dilated. - Tricuspid valve: Peak RV-RA gradient (S): 27 mm Hg. - Pulmonary arteries: PA peak pressure: 30 mm Hg (S). - Inferior vena cava: The vessel was normal in size. The respirophasic diameter changes were in the normal range (>= 50%), consistent with normal central venous pressure.  Impressions:  - Normal LV size with mild LV hypertrophy. EF 55-60%.  Moderate diastolic dysfunction. Normal RV size and systolic function.   ECG:   Vent. rate 95 BPM PR interval * ms QRS duration 169 ms QT/QTc 424/533 ms P-R-T axes -1 -87 66  Radiology:  Dg Chest 2 View  06/27/2015  CLINICAL DATA:  Weakness and tachycardia today. Hypertension. Nonsmoker. EXAM: CHEST  2 VIEW COMPARISON:  None. FINDINGS: Low lung volumes. Normal heart size and pulmonary vascularity. No focal airspace disease or consolidation in the lungs. No blunting of costophrenic angles. No pneumothorax. Mediastinal contours appear intact. Degenerative changes in the spine. IMPRESSION: No active cardiopulmonary disease. Electronically Signed   By: Lucienne Capers M.D.   On: 06/27/2015 22:46    ASSESSMENT AND PLAN:     1. Atrial fibrillation with rapid ventricular response (HCC) - New-onset. Unknown etiology. CHADSVASCs score of at least 5 (age, HTN, TIA and diastolic dysfunction). - Recently treated with IV Cardizem which was discontinued due to bradycardia, rate in mid 40s. Currently on IV heparin for anticoagulation. - He will need NOAC.  - TSH normal. -Last echocardiogram 11/07/2014 showed normal LV size with mild LV hypertrophy. EF 55-60%. Moderate diastolic dysfunction. Normal RV size and systolic function. PA peak pressure: 30 mm Hg (S). Ending reading of recent echo. 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 The patient denies melena or blood in his stool.  2. Near syncope - Could be from A. fib versus arrhythmia versus cardura (he took at 3 PM prior to going to work - likely etiology).  3. HTN - Cozaar and minoxidil. - Encouraged to take Cardura at night.  4. History of rheumatic heart disease  5. Elevated troponin -Troponin trend 0.21->0.26->0.18. Likely due to elevated rate. No anginal pain. Patient wants to go home. He will benefit from ischemic evaluation. Will plan for [facility] tomorrow. Continue Iv  heparin for now.   SignedLeanor Kail, [place] 06/28/2015, 2:12 PM Pager [phone]  Co-Sign MD  Patient seen and examined. Agree with assessment and plan.  Mr. Davydov is a 69 year old African-American male who has a long-standing history of hypertension and in 2013 was found to have small cortical infarctions which are felt most likely due to hypertension when he underwent CT imaging.  An echo Doppler study at that time revealed left ventricular hypertrophy with restrictive physiology.  He has a history of moderate obesity.  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  His last echo Doppler study in April 2016 showed mild LVH with normal systolic function, grade 2 diastolic dysfunction, mitral annular calcification, mitral annular calcification and mild biatrial enlargement.  Last evening, the patient developed presyncopal following taking a dose of cardura.  When he arrived, he initially was in sinus rhythm and then developed atrial fibrillation with rapid ventricular response which he was started on Cardizem infusion.  He subsequently developed bradycardia and converted to sinus rhythm.  Troponins have been mildly positive with a plateau curve 3.  An echo Doppler study was recently completed; results pending.  The patient denies exertional precipitation of chest pain.  His ECG earlier today revealed atrial fibrillation at 94 bpm with right bundle branch block and associated repolarization changes.  Will obtain another ECG since he has converted to sinus rhythm.  With his mild troponin elevation  I suspect that this is predominantly due to demand ischemia rather than a non-STEMI MI.  I have recommended the patient undergo a [place] study tomorrow for risk stratification and would not discharge the patient today.  If the nuclear study is low risk, he most likely can be discharged tomorrow; however, if high risk, definitive cardiac catheterization may  be indicated.  Of note, the patient has significant microcytic indices with an MCV 68.6 and may very well have a hemoglobinopathy such as thalassemia minor or sickle cell trait versus other etiologies.   Troy Sine, MD, [facility] 06/28/2015 3:19 PM

## 2015-06-28 NOTE — Progress Notes (Signed)
  Echocardiogram 2D Echocardiogram has been performed.  Joseph [name] 06/28/2015, 10:27 AM

## 2015-06-28 NOTE — Progress Notes (Signed)
ANTICOAGULATION CONSULT NOTE - Initial Consult  Pharmacy Consult for Heparin Indication: atrial fibrillation  No Known Allergies  Patient Measurements: Height: 5\' 9"  (175.3 cm) Weight: 223 lb 9.6 oz (101.424 kg) IBW/kg (Calculated) : 70.7 Heparin Dosing Weight: 92 kg  Vital Signs: Temp: 98 F (36.7 C) (12/15 2032) Temp Source: Oral (12/15 2032) BP: 119/62 mmHg (12/16 0100) Pulse Rate: 64 (12/16 0100)  Labs:  Recent Labs  06/27/15 2104  HGB 13.4  HCT 40.7  PLT 189  CREATININE 1.41*    Estimated Creatinine Clearance: 59.7 mL/min (by C-G formula based on Cr of 1.41).   Medical History: Past Medical History  Diagnosis Date  . Hypertension   . 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 RBBB   . 1St degree AV block   . Sinus bradycardia   . Diastolic dysfunction 123456    grade 1    Medications:  Prescriptions prior to admission  Medication Sig Dispense Refill Last Dose  . amLODipine (NORVASC) 10 MG tablet TAKE 1 TABLET BY MOUTH DAILY 30 tablet 11 06/26/2015 at Unknown time  . doxazosin (CARDURA) 4 MG tablet Take 8 mg by mouth at bedtime.    06/27/2015 at Unknown time  . losartan (COZAAR) 100 MG tablet Take 100 mg by mouth daily.   06/27/2015 at Unknown time  . minoxidil (LONITEN) 2.5 MG tablet Take 5 mg by mouth daily.    06/26/2015 at Unknown time  . saw palmetto 160 MG capsule Take 160 mg by mouth 2 (two) times daily.   06/27/2015 at Unknown time    Assessment: 69 y.o. M presents with afib, syncope. To begin heparin for afib. CBC ok at baseline. No anticoagulants PTA.   Goal of Therapy:  Heparin level 0.3-0.7 units/ml Monitor platelets by anticoagulation protocol: Yes   Plan:  Heparin IV bolus 4000 units Heparin gtt at 1400 units/hr Will f/u heparin level in 6 hours Daily heparin level and CBC  Sherlon Handing, PharmD, BCPS Clinical pharmacist, pager [phone] 06/28/2015,1:29 AM

## 2015-06-28 NOTE — Progress Notes (Signed)
[facility] PROGRESS NOTE  Joseph [name] [id number] DOB: [birth year] DOA: 06/27/2015 PCP: Joseph Bellow, MD  Brief narrative 69 year old male with history of hypertension, rheumatic heart disease, ? TIA in 2012 ( MRI showing chronic ischemic changes and multiple areas of microhemorrhage , patient denies any such history) , diastolic dysfunction, was at work when he felt dizzy and almost passed out. 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 He also felt diaphoretic, was nauseous and vomited. When patient was brought to the ED was found to be in A. fib with RVR. He then had an episode of bradycardia. Patient initially placed on Cardizem drip but given the bradycardia was discontinued and started on IV heparin drip. He had mild acute kidney injury and elevated troponin. Admitted to hospitalist service. Cardiology consulted.  Assessment/Plan: Near syncope Possibly associated with bradycardia/ tachybrady symptoms vs ACS . A denies any chest pain but has mildly elevated troponin. -At on telemetry. Heart rate in the 40s-50s. Discontinued Cardizem drip. -Follow serial cardiac enzymes and EKG. -Recent 2-D echo in April of this year showed EF of 50-60% with grade 2 diastolic dysfunction. Check repeat echo. -Urine drug screen and TSH normal. -Cardiology consulted and will follow up with recommendations.  New-onset A. fib with RVR Currently bradycardic. On IV heparin infusion. His chads vascular score is 5 . Will require anticoagualtion. 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  Essential hypertension Continue Cozaar and minoxidil.  History of rheumatic heart disease Follows with Dr. Claiborne [name]   BPH On cardura at home which is being held.  DVT prophylaxis: IV heparin Diet: Heart  healthy  Code Status: Full code Family Communication: Wife at bedside Disposition Plan: Home once workup completed   Consultants:  Cardiology  Procedures:  2-D echo  Antibiotics:  None  HPI/Subjective: Seen and examined. Admission H&P reviewed. Denies any dizziness, lightheadedness or chest discomfort.  Objective: Filed Vitals:   06/28/15 0132 06/28/15 0518  BP: 148/71 148/70  Pulse: 67 86  Temp: 98.4 F (36.9 C) 98.3 F (36.8 C)  Resp: 18 18    Intake/Output Summary (Last 24 hours) at 06/28/15 1314 Last data filed at 06/28/15 1034  Gross per 24 hour  Intake   1860 ml  Output    425 ml  Net   1435 ml   Filed Weights   06/28/15 0128  Weight: 101.424 kg (223 lb 9.6 oz)    Exam:   General: Elderly male not in distress  HEENT: No pallor, moist mucosa, supple neck  Chest: Clear to auscultation bilaterally  CVS: S1 and S2 bradycardic, no murmurs or gallop  GI: Soft, nondistended, nontender, bowel sounds present  Musculoskeletal: Warm, no edema   Data Reviewed: Basic Metabolic Panel:  Recent Labs Lab 06/27/15 2104 06/28/15 0715  NA 140 138  K 4.3 3.6  CL 105 108  CO2 24 24  GLUCOSE 96 110*  BUN 21* 19  CREATININE 1.41* 1.15  CALCIUM 9.2 8.2*   Liver Function Tests:  Recent Labs Lab 06/28/15 0715  AST 22  ALT 18  ALKPHOS 69  BILITOT 1.1  PROT 5.3*  ALBUMIN 3.0*   No results for input(s): LIPASE, AMYLASE in the last 168 hours. No results for input(s): AMMONIA in the last 168 hours. CBC:  Recent Labs Lab 06/27/15 2104 06/28/15 0715  WBC  5.3 6.7  NEUTROABS  --  4.2  HGB 13.4 11.4*  HCT 40.7 35.2*  MCV 68.3* 68.6*  PLT 189 185   Cardiac Enzymes:  Recent Labs Lab 06/28/15 0316 06/28/15 0715  TROPONINI 0.21* 0.26*   BNP (last 3 results) No results for input(s): BNP in the last 8760 hours.  ProBNP (last 3 results) No results for input(s): PROBNP in the last 8760 hours.  CBG:  Recent Labs Lab 06/27/15 2101   GLUCAP 89    Recent Results (from the past 240 hour(s))  MRSA PCR Screening     Status: None   Collection Time: 06/28/15  1:36 AM  Result Value Ref Range Status   MRSA by PCR NEGATIVE NEGATIVE Final    Comment:        The GeneXpert MRSA Assay (FDA approved for NASAL specimens only), is one component of a comprehensive MRSA colonization surveillance program. It is not intended to diagnose MRSA infection nor to guide or monitor treatment for MRSA infections.      Studies: Dg Chest 2 View  06/27/2015  CLINICAL DATA:  Weakness and tachycardia today. Hypertension. Nonsmoker. EXAM: CHEST  2 VIEW COMPARISON:  None. FINDINGS: Low lung volumes. Normal heart size and pulmonary vascularity. No focal airspace disease or consolidation in the lungs. No blunting of costophrenic angles. No pneumothorax. Mediastinal contours appear intact. Degenerative changes in the spine. IMPRESSION: No active cardiopulmonary disease. Electronically Signed   By: Lucienne Capers M.D.   On: 06/27/2015 22:46    Scheduled Meds: . doxazosin  8 mg Oral QHS  . losartan  100 mg Oral Daily  . minoxidil  5 mg Oral Daily   Continuous Infusions: . sodium chloride 10 mL/hr at 06/28/15 0130  . heparin 1,400 Units/hr (06/28/15 0205)      Time spent: 25 minutes    Joseph [name], Joseph [name]  [facility] Pager [phone]. If 7PM-7AM, please contact night-coverage at [email or web address], password [facility] 06/28/2015, 1:14 PM  LOS: 1 day

## 2015-06-28 NOTE — Progress Notes (Signed)
UR Completed Belvia Gotschall Graves-Bigelow, RN,BSN [phone]  

## 2015-06-28 NOTE — ED Notes (Signed)
Spoke with Dr Hal Hope, he stated to leave the Diltiazem drip off and restarted if the pt's HR goes back up.

## 2015-06-28 NOTE — Progress Notes (Addendum)
ANTICOAGULATION CONSULT NOTE - Initial Consult  Pharmacy Consult for Heparin Indication: atrial fibrillation  No Known Allergies  Patient Measurements: Height: 5\' 9"  (175.3 cm) Weight: 223 lb 9.6 oz (101.424 kg) IBW/kg (Calculated) : 70.7 Heparin Dosing Weight: 92 kg  Vital Signs: Temp: 98.3 F (36.8 C) (12/16 0518) Temp Source: Oral (12/16 0518) BP: 148/70 mmHg (12/16 0518) Pulse Rate: 86 (12/16 0518)  Labs:  Recent Labs  06/27/15 2104 06/28/15 0316 06/28/15 0715  HGB 13.4  --  11.4*  HCT 40.7  --  35.2*  PLT 189  --  185  HEPARINUNFRC  --   --  0.54  CREATININE 1.41*  --   --   TROPONINI  --  0.21*  --     Estimated Creatinine Clearance: 59.7 mL/min (by C-G formula based on Cr of 1.41).   Medical History: Past Medical History  Diagnosis Date  . Hypertension   . 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 RBBB   . 1St degree AV block   . Sinus bradycardia   . Diastolic dysfunction 123456    grade 1    Medications:  Prescriptions prior to admission  Medication Sig Dispense Refill Last Dose  . amLODipine (NORVASC) 10 MG tablet TAKE 1 TABLET BY MOUTH DAILY 30 tablet 11 06/26/2015 at Unknown time  . doxazosin (CARDURA) 4 MG tablet Take 8 mg by mouth at bedtime.    06/27/2015 at Unknown time  . losartan (COZAAR) 100 MG tablet Take 100 mg by mouth daily.   06/27/2015 at Unknown time  . minoxidil (LONITEN) 2.5 MG tablet Take 5 mg by mouth daily.    06/26/2015 at Unknown time  . saw palmetto 160 MG capsule Take 160 mg by mouth 2 (two) times daily.   06/27/2015 at Unknown time    Assessment: 69 y.o. M presents with afib, syncope. To begin heparin for afib. CBC ok at baseline. No anticoagulants PTA. HL came back therapeutic this AM. CBC stable. Will repeat a confirm level.   Goal of Therapy:  Heparin level 0.3-0.7 units/ml Monitor platelets by anticoagulation protocol: Yes   Plan:   Cont heparin gtt at 1400 units/hr Confirm HL in 6 hr Daily heparin level and  CBC  Onnie Boer, PharmD Pager: [phone] 06/28/2015 8:23 AM  Addendum:  Confirm HL is therapeutic.   Cont heparin at current rate.   Onnie Boer, PharmD Pager: [phone] 06/28/2015 1:54 PM

## 2015-06-28 NOTE — H&P (Signed)
[facility] History and Physical  Joseph [name] [id number] DOB: Apr 11, 1946 DOA: 06/27/2015  Referring physician: Dr. Canary Brim. PCP: Robert Bellow, MD  Specialists: Dr. Shelva Majestic cardiologist.  Chief Complaint: Almost passed out.  HPI: Joseph [name] is a 69 y.o. male with history of hypertension, rheumatic heart disease, diastolic dysfunction being followed by Dr. Shelva Majestic presents to the ER after patient felt dizzy and almost passed out. Patient states around 7 PM while at work patient started feeling dizzy and diaphoretic. Patient had nausea and vomiting. Patient was brought to the ER by EMS. Patient was found to be in A. fib with RVR. Patient also had episodes of bradycardia. On-call cardiologist Dr. Golden Hurter was consulted by ER physician over this time is recommended patient be placed on Cardizem infusion. Patient otherwise denies any palpitations chest pain or shortness of breath. Has had episodes of diaphoresis and nausea. Denies any abdominal pain diarrhea fever chills.   Review of systems -  As presented in the history of presenting illness, rest negative.  Past Medical History  Diagnosis Date  . Hypertension   . 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 RBBB   . 1St degree AV block   . Sinus bradycardia   . Diastolic dysfunction 123456    grade 1   Past Surgical History  Procedure Laterality Date  . Colonoscopy    . Colonoscopy  12/30/2011    Procedure: COLONOSCOPY;  Surgeon: [name], MD;  Location: [facility];  Service: Endoscopy;  Laterality: N/A;  1:45PM   Social History:  reports that he has quit smoking. He does not have any smokeless tobacco history on file. He reports that he drinks alcohol. He reports that he does not use illicit drugs. Where does patient live home. Can patient participate in ADLs? Yes.  No Known Allergies  Family History:  Family History  Problem Relation Age of Onset  . Colon cancer Neg Hx   . Cancer Brother    . CVA Mother   . Hypertension Father       Prior to Admission medications   Medication Sig Start Date End Date Taking? Authorizing Provider  amLODipine (NORVASC) 10 MG tablet TAKE 1 TABLET BY MOUTH DAILY 10/29/14  Yes Troy Sine, MD  doxazosin (CARDURA) 4 MG tablet Take 8 mg by mouth at bedtime.    Yes Historical Provider, MD  losartan (COZAAR) 100 MG tablet Take 100 mg by mouth daily.   Yes Historical Provider, MD  minoxidil (LONITEN) 2.5 MG tablet Take 5 mg by mouth daily.    Yes Historical Provider, MD  saw palmetto 160 MG capsule Take 160 mg by mouth 2 (two) times daily.   Yes Historical Provider, MD    Physical Exam: Filed Vitals:   06/28/15 0027 06/28/15 0028 06/28/15 0029 06/28/15 0030  BP:      Pulse: 59 68 57 57  Temp:      TempSrc:      Resp: 15 18 15 15   SpO2: 97% 99% 98% 99%     General:  Moderately built and nourished.  Eyes: Anicteric no pallor.  ENT: No discharge from the ears eyes nose and mouth.  Neck: No mass felt.  Cardiovascular: S1-S2 heard.  Respiratory: No rhonchi or crepitations.  Abdomen: Soft nontender bowel sounds present.  Skin: No rash.  Musculoskeletal: No edema.  Psychiatric: Appears normal.  Neurologic: Alert awake oriented to time place and person. Moves all extremities.  Labs on Admission:  Basic Metabolic  Panel:  Recent Labs Lab 06/27/15 2104  NA 140  K 4.3  CL 105  CO2 24  GLUCOSE 96  BUN 21*  CREATININE 1.41*  CALCIUM 9.2   Liver Function Tests: No results for input(s): AST, ALT, ALKPHOS, BILITOT, PROT, ALBUMIN in the last 168 hours. No results for input(s): LIPASE, AMYLASE in the last 168 hours. No results for input(s): AMMONIA in the last 168 hours. CBC:  Recent Labs Lab 06/27/15 2104  WBC 5.3  HGB 13.4  HCT 40.7  MCV 68.3*  PLT 189   Cardiac Enzymes: No results for input(s): CKTOTAL, CKMB, CKMBINDEX, TROPONINI in the last 168 hours.  BNP (last 3 results) No results for input(s): BNP in the  last 8760 hours.  ProBNP (last 3 results) No results for input(s): PROBNP in the last 8760 hours.  CBG:  Recent Labs Lab 06/27/15 2101  GLUCAP 89    Radiological Exams on Admission: Dg Chest 2 View  06/27/2015  CLINICAL DATA:  Weakness and tachycardia today. Hypertension. Nonsmoker. EXAM: CHEST  2 VIEW COMPARISON:  None. FINDINGS: Low lung volumes. Normal heart size and pulmonary vascularity. No focal airspace disease or consolidation in the lungs. No blunting of costophrenic angles. No pneumothorax. Mediastinal contours appear intact. Degenerative changes in the spine. IMPRESSION: No active cardiopulmonary disease. Electronically Signed   By: Lucienne Capers M.D.   On: 06/27/2015 22:46    EKG: Independently reviewed. A. fib with RVR.  Assessment/Plan Active Problems:   HTN (hypertension)   Atrial fibrillation with rapid ventricular response (HCC)   Atrial fibrillation ([place])   1. A. fib with RVR with episodes of bradycardia - at this time I have placed patient on heparin infusion given the patient's history of hypertension and rheumatic heart disease and diastolic dysfunction and previous CAT scan showing lateral infarcts. Patient was started on Cardizem infusion but did have episodes of bradycardia. 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 I have requested cardiology consult. Check thyroid function tests, urine drug screen. Cycle cardiac markers. Last 2-D echo done in April 2016 was showing EF of 55-60% with grade 2 diastolic dysfunction. 2. Near syncope - see #1. 3. Hypertension - since patient will be on Cardizem and Norvasc will be held as requested by Dr. Golden Hurter. Continue Cozaar and minoxidil. 4. History of rheumatic heart disease - check 2-D echo. 5. BPH.  I have reviewed patient's old charts and labs.   DVT Prophylaxis heparin infusion.  Code Status: Full code.  Family Communication: Discussed with patient's wife.  Disposition Plan:  Admit to inpatient.    KAKRAKANDY,ARSHAD N. [facility] Pager [phone].  If 7PM-7AM, please contact night-coverage [email or web address] Password [email or web address] 06/28/2015, 12:44 AM

## 2015-06-29 ENCOUNTER — Observation Stay (HOSPITAL_COMMUNITY): Payer: Medicare Other

## 2015-06-29 ENCOUNTER — Encounter (HOSPITAL_COMMUNITY): Payer: Self-pay | Admitting: Student

## 2015-06-29 DIAGNOSIS — I10 Essential (primary) hypertension: Secondary | ICD-10-CM | POA: Diagnosis not present

## 2015-06-29 DIAGNOSIS — I519 Heart disease, unspecified: Secondary | ICD-10-CM | POA: Diagnosis not present

## 2015-06-29 DIAGNOSIS — Z5181 Encounter for therapeutic drug level monitoring: Secondary | ICD-10-CM

## 2015-06-29 DIAGNOSIS — R7989 Other specified abnormal findings of blood chemistry: Secondary | ICD-10-CM | POA: Diagnosis not present

## 2015-06-29 DIAGNOSIS — R001 Bradycardia, unspecified: Secondary | ICD-10-CM | POA: Diagnosis present

## 2015-06-29 DIAGNOSIS — I4891 Unspecified atrial fibrillation: Secondary | ICD-10-CM | POA: Diagnosis not present

## 2015-06-29 DIAGNOSIS — R55 Syncope and collapse: Principal | ICD-10-CM

## 2015-06-29 DIAGNOSIS — Z7901 Long term (current) use of anticoagulants: Secondary | ICD-10-CM

## 2015-06-29 LAB — CBC
HEMATOCRIT: 34.6 % — AB (ref 39.0–52.0)
Hemoglobin: 11.4 g/dL — ABNORMAL LOW (ref 13.0–17.0)
MCH: 22.4 pg — ABNORMAL LOW (ref 26.0–34.0)
MCHC: 32.9 g/dL (ref 30.0–36.0)
MCV: 68 fL — ABNORMAL LOW (ref 78.0–100.0)
Platelets: 163 10*3/uL (ref 150–400)
RBC: 5.09 MIL/uL (ref 4.22–5.81)
RDW: 15.1 % (ref 11.5–15.5)
WBC: 5 10*3/uL (ref 4.0–10.5)

## 2015-06-29 LAB — IRON AND TIBC
Iron: 112 ug/dL (ref 45–182)
SATURATION RATIOS: 41 % — AB (ref 17.9–39.5)
TIBC: 273 ug/dL (ref 250–450)
UIBC: 161 ug/dL

## 2015-06-29 LAB — FERRITIN: Ferritin: 78 ng/mL (ref 24–336)

## 2015-06-29 LAB — T3, FREE: T3, Free: 2.8 pg/mL (ref 2.0–4.4)

## 2015-06-29 LAB — HEPARIN LEVEL (UNFRACTIONATED): HEPARIN UNFRACTIONATED: 0.67 [IU]/mL (ref 0.30–0.70)

## 2015-06-29 MED ORDER — TECHNETIUM TC 99M SESTAMIBI - CARDIOLITE
30.0000 | Freq: Once | INTRAVENOUS | Status: AC | PRN
  Administered 2015-06-29: 30 via INTRAVENOUS

## 2015-06-29 MED ORDER — APIXABAN 5 MG PO TABS: 5.0000 mg | ORAL_TABLET | Freq: Two times a day (BID) | ORAL | Status: DC

## 2015-06-29 MED ORDER — APIXABAN 5 MG PO TABS
5.0000 mg | ORAL_TABLET | Freq: Two times a day (BID) | ORAL | Status: DC
  Administered 2015-06-29: 5 mg via ORAL
  Filled 2015-06-29: qty 1

## 2015-06-29 MED ORDER — REGADENOSON 0.4 MG/5ML IV SOLN
0.4000 mg | Freq: Once | INTRAVENOUS | Status: AC
  Administered 2015-06-29: 0.4 mg via INTRAVENOUS
  Filled 2015-06-29: qty 5

## 2015-06-29 MED ORDER — REGADENOSON 0.4 MG/5ML IV SOLN
INTRAVENOUS | Status: AC
  Filled 2015-06-29: qty 5

## 2015-06-29 MED ORDER — TECHNETIUM TC 99M SESTAMIBI GENERIC - CARDIOLITE
10.0000 | Freq: Once | INTRAVENOUS | Status: AC | PRN
  Administered 2015-06-29: 10 via INTRAVENOUS

## 2015-06-29 NOTE — Progress Notes (Signed)
[facility] PROGRESS NOTE  Joseph [name] [id number] DOB: [birth year] DOA: 06/27/2015 PCP: Robert Bellow, MD  Brief narrative 69 year old male with history of hypertension, rheumatic heart disease, ? TIA in 2012 ( MRI showing chronic ischemic changes and multiple areas of microhemorrhage , patient denies any such history) , diastolic dysfunction, was at work when he felt dizzy and almost passed out. 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 He also felt diaphoretic, was nauseous and vomited. When patient was brought to the ED was found to be in A. fib with RVR. He then had an episode of bradycardia. Patient initially placed on Cardizem drip but given the bradycardia was discontinued and started on IV heparin drip. He had mild acute kidney injury and elevated troponin. Admitted to hospitalist service. Cardiology consulted.  Assessment/Plan: Near syncope Possibly associated with bradycardia  vs ACS . Denies any chest pain and troponin peaked at 0.26. -Stable on telemetry. Heart rate in the 40s-50s. Discontinued Cardizem drip. -Recent 2-D echo in April of this year showed EF of 55-60% with grade 2 diastolic dysfunction. Repeat echo shows EF of 50-55% with pseudo-normal left ventricular filling pattern with abnormal relaxation and a 2 diastolic dysfunction. -Urine drug screen and TSH normal. Pt brady to 40s and 50s but asymptomatic.  -underwent Lexiscan Myoview today which showed nonreversible decreased myocardial perfusion of the inferior wall of the left ventricle with EF of 42%. Will discuss results with cardiology. -  New-onset A. fib with RVR Currently bradycardic. On IV heparin infusion. His chads vascular score is 5 . Will require anticoagualtion with newer agent upon discharge. Agrees with using eliquis.  . Essential hypertension Continue  Cozaar and minoxidil.  History of rheumatic heart disease Follows with Dr. Claiborne Billings   BPH On cardura which can be resumed.  DVT prophylaxis: IV heparin Diet: Heart healthy  Code Status: Full code Family Communication: Wife at bedside Disposition Plan: Home today if cardiac cath not needed   Consultants:  Cardiology  Procedures:  2-D echo  Antibiotics:  None  HPI/Subjective: Seen and examined  Objective: Filed Vitals:   06/29/15 1147 06/29/15 1428  BP: 161/73 163/62  Pulse: 60 60  Temp:  98.4 F (36.9 C)  Resp:  18    Intake/Output Summary (Last 24 hours) at 06/29/15 1608 Last data filed at 06/29/15 1300  Gross per 24 hour  Intake 390.83 ml  Output   2200 ml  Net -1809.17 ml   Filed Weights   06/28/15 0128 06/29/15 1428  Weight: 101.424 kg (223 lb 9.6 oz) 103.148 kg (227 lb 6.4 oz)    Exam:   General: Elderly male not in distress  HEENT: No pallor, moist mucosa, supple neck  Chest: Clear to auscultation bilaterally  CVS: S1 and S2 bradycardic, no murmurs or gallop  GI: Soft, nondistended, nontender, bowel sounds present  Musculoskeletal: Warm, no edema   Data Reviewed: Basic Metabolic Panel:  Recent Labs Lab 06/27/15 2104 06/28/15 0715  NA 140 138  K 4.3 3.6  CL 105 108  CO2 24 24  GLUCOSE 96 110*  BUN 21* 19  CREATININE 1.41* 1.15  CALCIUM 9.2 8.2*   Liver Function Tests:  Recent Labs Lab 06/28/15 0715  AST 22  ALT 18  ALKPHOS 69  BILITOT 1.1  PROT 5.3*  ALBUMIN 3.0*   No results for input(s): LIPASE, AMYLASE in the  last 168 hours. No results for input(s): AMMONIA in the last 168 hours. CBC:  Recent Labs Lab 06/27/15 2104 06/28/15 0715 06/29/15 0350  WBC 5.3 6.7 5.0  NEUTROABS  --  4.2  --   HGB 13.4 11.4* 11.4*  HCT 40.7 35.2* 34.6*  MCV 68.3* 68.6* 68.0*  PLT 189 185 163   Cardiac Enzymes:  Recent Labs Lab 06/28/15 0316 06/28/15 0715 06/28/15 1318  TROPONINI 0.21* 0.26* 0.18*   BNP (last 3  results) No results for input(s): BNP in the last 8760 hours.  ProBNP (last 3 results) No results for input(s): PROBNP in the last 8760 hours.  CBG:  Recent Labs Lab 06/27/15 2101  GLUCAP 89    Recent Results (from the past 240 hour(s))  MRSA PCR Screening     Status: None   Collection Time: 06/28/15  1:36 AM  Result Value Ref Range Status   MRSA by PCR NEGATIVE NEGATIVE Final    Comment:        The GeneXpert MRSA Assay (FDA approved for NASAL specimens only), is one component of a comprehensive MRSA colonization surveillance program. It is not intended to diagnose MRSA infection nor to guide or monitor treatment for MRSA infections.      Studies: Dg Chest 2 View  06/27/2015  CLINICAL DATA:  Weakness and tachycardia today. Hypertension. Nonsmoker. EXAM: CHEST  2 VIEW COMPARISON:  None. FINDINGS: Low lung volumes. Normal heart size and pulmonary vascularity. No focal airspace disease or consolidation in the lungs. No blunting of costophrenic angles. No pneumothorax. Mediastinal contours appear intact. Degenerative changes in the spine. IMPRESSION: No active cardiopulmonary disease. Electronically Signed   By: Lucienne Capers M.D.   On: 06/27/2015 22:46   Nm Myocar Multi W/spect W/wall Motion / Ef  06/29/2015  CLINICAL DATA:  Elevated troponins, stroke, hypertension, family history of heart disease, first degree AV block, diastolic dysfunction EXAM: MYOCARDIAL IMAGING WITH SPECT (REST AND PHARMACOLOGIC-STRESS) GATED LEFT VENTRICULAR WALL MOTION STUDY LEFT VENTRICULAR EJECTION FRACTION TECHNIQUE: Standard myocardial SPECT imaging was performed after resting intravenous injection of 10 mCi Tc-70m sestamibi. Subsequently, intravenous infusion of Lexiscan was performed under the supervision of the Cardiology staff. At peak effect of the drug, 30 mCi Tc-3m sestamibi was injected intravenously and standard myocardial SPECT imaging was performed. Quantitative gated imaging was also  performed to evaluate left ventricular wall motion, and estimate left ventricular ejection fraction. COMPARISON:  None. FINDINGS: Perfusion: Non reversible decreased myocardial perfusion inferior wall LEFT ventricle. Wall Motion: Dilated LEFT ventricle. No focal wall motion abnormalities. Left Ventricular Ejection Fraction: 42 % End diastolic volume [id number] ml End systolic volume [id number] ml IMPRESSION: 1. Non reversible decreased myocardial perfusion inferior wall LEFT ventricle. 2. Dilated LEFT ventricle without focal wall motion abnormalities. 3. Left ventricular ejection fraction 42% 4. Intermediate-risk stress test findings*. *2012 Appropriate Use Criteria for Coronary Revascularization Focused Update: J Am Coll Cardiol. [id number]. http://content.[email or web address].aspx?articleid=[email or web address] Electronically Signed   By: Lavonia Dana M.D.   On: 06/29/2015 15:56    Scheduled Meds: . losartan  100 mg Oral Daily  . minoxidil  5 mg Oral Daily  . regadenoson       Continuous Infusions: . sodium chloride 10 mL/hr at 06/28/15 0130  . heparin 1,400 Units/hr (06/29/15 0400)      Time spent: 25 minutes    Tirza Senteno, [place]  [facility] Pager [phone]. If 7PM-7AM, please contact night-coverage at [email or web address], password [facility] 06/29/2015, 4:08 PM  LOS: 2 days

## 2015-06-29 NOTE — Discharge Instructions (Addendum)
Atrial Fibrillation °Atrial fibrillation is a type of irregular or rapid heartbeat (arrhythmia). In atrial fibrillation, the heart quivers continuously in a chaotic pattern. This occurs when parts of the heart receive disorganized signals that make the heart unable to pump blood normally. This can increase the risk for stroke, heart failure, and other heart-related conditions. There are different types of atrial fibrillation, including: °· Paroxysmal atrial fibrillation. This type starts suddenly, and it usually stops on its own shortly after it starts. °· Persistent atrial fibrillation. This type often lasts longer than a week. It may stop on its own or with treatment. °· Long-lasting persistent atrial fibrillation. This type lasts longer than 12 months. °· Permanent atrial fibrillation. This type does not go away. °Talk with your health care provider to learn about the type of atrial fibrillation that you have. °CAUSES °This condition is caused by some heart-related conditions or procedures, including: °· A heart attack. °· Coronary artery disease. °· Heart failure. °· Heart valve conditions. °· High blood pressure. °· Inflammation of the sac that surrounds the heart (pericarditis). °· Heart surgery. °· Certain heart rhythm disorders, such as Wolf-Parkinson-White syndrome. °Other causes include: °· Pneumonia. °· Obstructive sleep apnea. °· Blockage of an artery in the lungs (pulmonary embolism, or PE). °· Lung cancer. °· Chronic lung disease. °· Thyroid problems, especially if the thyroid is overactive (hyperthyroidism). °· Caffeine. °· Excessive alcohol use or illegal drug use. °· Use of some medicines, including certain decongestants and diet pills. °Sometimes, the cause cannot be found. °RISK FACTORS °This condition is more likely to develop in: °· People who are older in age. °· People who smoke. °· People who have diabetes mellitus. °· People who are overweight (obese). °· Athletes who exercise  vigorously. °SYMPTOMS °Symptoms of this condition include: °· A feeling that your heart is beating rapidly or irregularly. °· A feeling of discomfort or pain in your chest. °· Shortness of breath. °· Sudden light-headedness or weakness. °· Getting tired easily during exercise. °In some cases, there are no symptoms. °DIAGNOSIS °Your health care provider may be able to detect atrial fibrillation when taking your pulse. If detected, this condition may be diagnosed with: °· An electrocardiogram (ECG). °· A Holter monitor test that records your heartbeat patterns over a 24-hour period. °· Transthoracic echocardiogram (TTE) to evaluate how blood flows through your heart. °· Transesophageal echocardiogram (TEE) to view more detailed images of your heart. °· A stress test. °· Imaging tests, such as a CT scan or chest X-ray. °· Blood tests. °TREATMENT °The main goals of treatment are to prevent blood clots from forming and to keep your heart beating at a normal rate and rhythm. The type of treatment that you receive depends on many factors, such as your underlying medical conditions and how you feel when you are experiencing atrial fibrillation. °This condition may be treated with: °· Medicine to slow down the heart rate, bring the heart's rhythm back to normal, or prevent clots from forming. °· Electrical cardioversion. This is a procedure that resets your heart's rhythm by delivering a controlled, low-energy shock to the heart through your skin. °· Different types of ablation, such as catheter ablation, catheter ablation with pacemaker, or surgical ablation. These procedures destroy the heart tissues that send abnormal signals. When the pacemaker is used, it is placed under your skin to help your heart beat in a regular rhythm. °HOME CARE INSTRUCTIONS °· Take over-the counter and prescription medicines only as told by your health care provider. °·   If your health care provider prescribed a blood-thinning medicine  (anticoagulant), take it exactly as told. Taking too much blood-thinning medicine can cause bleeding. If you do not take enough blood-thinning medicine, you will not have the protection that you need against stroke and other problems.  Do not use tobacco products, including cigarettes, chewing tobacco, and e-cigarettes. If you need help quitting, ask your health care provider.  If you have obstructive sleep apnea, manage your condition as told by your health care provider.  Do not drink alcohol.  Do not drink beverages that contain caffeine, such as coffee, soda, and tea.  Maintain a healthy weight. Do not use diet pills unless your health care provider approves. Diet pills may make heart problems worse.  Follow diet instructions as told by your health care provider.  Exercise regularly as told by your health care provider.  Keep all follow-up visits as told by your health care provider. This is important. PREVENTION  Avoid drinking beverages that contain caffeine or alcohol.  Avoid certain medicines, especially medicines that are used for breathing problems.  Avoid certain herbs and herbal medicines, such as those that contain ephedra or ginseng.  Do not use illegal drugs, such as cocaine and amphetamines.  Do not smoke.  Manage your high blood pressure. SEEK MEDICAL CARE IF:  You notice a change in the rate, rhythm, or strength of your heartbeat.  You are taking an anticoagulant and you notice increased bruising.  You tire more easily when you exercise or exert yourself. SEEK IMMEDIATE MEDICAL CARE IF:  You have chest pain, abdominal pain, sweating, or weakness.  You feel nauseous.  You notice blood in your vomit, bowel movement, or urine.  You have shortness of breath.  You suddenly have swollen feet and ankles.  You feel dizzy.  You have sudden weakness or numbness of the face, arm, or leg, especially on one side of the body.  You have trouble speaking,  trouble understanding, or both (aphasia).  Your face or your eyelid droops on one side. These symptoms may represent a serious problem that is an emergency. Do not wait to see if the symptoms will go away. Get medical help right away. Call your local emergency services (911 in the [place] Do not drive yourself to the hospital.   This information is not intended to replace advice given to you by your health care provider. Make sure you discuss any questions you have with your health care provider.   Document Released: 06/29/2005 Document Revised: 03/20/2015 Document Reviewed: 10/24/2014 Elsevier Interactive Patient Education 2016 [place].   Apixaban oral tablets What is this medicine? APIXABAN (a PIX a ban) is an anticoagulant (blood thinner). It is used to lower the chance of stroke in people with a medical condition called atrial fibrillation. It is also used to treat or prevent blood clots in the lungs or in the veins. This medicine may be used for other purposes; ask your health care provider or pharmacist if you have questions. What should I tell my health care provider before I take this medicine? They need to know if you have any of these conditions: -bleeding disorders -bleeding in the brain -blood in your stools (black or tarry stools) or if you have blood in your vomit -history of stomach bleeding -kidney disease -liver disease -mechanical heart valve -an unusual or allergic reaction to apixaban, other medicines, foods, dyes, or preservatives -pregnant or trying to get pregnant -breast-feeding How should I use this medicine? Take this  medicine by mouth with a glass of water. Follow the directions on the prescription label. You can take it with or without food. If it upsets your stomach, take it with food. Take your medicine at regular intervals. Do not take it more often than directed. Do not stop taking except on your doctor's advice. Stopping this medicine may increase  your risk of a blot clot. Be sure to refill your prescription before you run out of medicine. Talk to your pediatrician regarding the use of this medicine in children. Special care may be needed. Overdosage: If you think you have taken too much of this medicine contact a poison control center or emergency room at once. NOTE: This medicine is only for you. Do not share this medicine with others. What if I miss a dose? If you miss a dose, take it as soon as you can. If it is almost time for your next dose, take only that dose. Do not take double or extra doses. What may interact with this medicine? This medicine may interact with the following: -aspirin and aspirin-like medicines -certain medicines for fungal infections like ketoconazole and itraconazole -certain medicines for seizures like carbamazepine and phenytoin -certain medicines that treat or prevent blood clots like warfarin, enoxaparin, and dalteparin -clarithromycin -NSAIDs, medicines for pain and inflammation, like ibuprofen or naproxen -rifampin -ritonavir -St. John's wort This list may not describe all possible interactions. Give your health care provider a list of all the medicines, herbs, non-prescription drugs, or dietary supplements you use. Also tell them if you smoke, drink alcohol, or use illegal drugs. Some items may interact with your medicine. What should I watch for while using this medicine? Notify your doctor or health care professional and seek emergency treatment if you develop breathing problems; changes in vision; chest pain; severe, sudden headache; pain, swelling, warmth in the leg; trouble speaking; sudden numbness or weakness of the face, arm, or leg. These can be signs that your condition has gotten worse. If you are going to have surgery, tell your doctor or health care professional that you are taking this medicine. Tell your health care professional that you use this medicine before you have a spinal or  epidural procedure. Sometimes people who take this medicine have bleeding problems around the spine when they have a spinal or epidural procedure. This bleeding is very rare. If you have a spinal or epidural procedure while on this medicine, call your health care professional immediately if you have back pain, numbness or tingling (especially in your legs and feet), muscle weakness, paralysis, or loss of bladder or bowel control. Avoid sports and activities that might cause injury while you are using this medicine. Severe falls or injuries can cause unseen bleeding. Be careful when using sharp tools or knives. Consider using an Copy. Take special care brushing or flossing your teeth. Report any injuries, bruising, or red spots on the skin to your doctor or health care professional. What side effects may I notice from receiving this medicine? Side effects that you should report to your doctor or health care professional as soon as possible: -allergic reactions like skin rash, itching or hives, swelling of the face, lips, or tongue -signs and symptoms of bleeding such as bloody or black, tarry stools; red or dark-brown urine; spitting up blood or brown material that looks like coffee grounds; red spots on the skin; unusual bruising or bleeding from the eye, gums, or nose This list may not describe all possible side effects. Call  your doctor for medical advice about side effects. You may report side effects to FDA at 1-800-FDA-1088. Where should I keep my medicine? Keep out of the reach of children. Store at room temperature between 20 and 25 degrees C (68 and 77 degrees F). Throw away any unused medicine after the expiration date. NOTE: This sheet is a summary. It may not cover all possible information. If you have questions about this medicine, talk to your doctor, pharmacist, or health care provider.    2016, Elsevier/Gold Standard. (2013-03-03 11:59:24)  Information on my medicine -  ELIQUIS (apixaban)  This medication education was reviewed with me or my healthcare representative as part of my discharge preparation.  The pharmacist that spoke with me during my hospital stay was:  [facility], Pharm D Why was Eliquis prescribed for you? Eliquis was prescribed for you to reduce the risk of a blood clot forming that can cause a stroke if you have a medical condition called atrial fibrillation (a type of irregular heartbeat).  What do You need to know about Eliquis ? Take your Eliquis TWICE DAILY - one tablet in the morning and one tablet in the evening with or without food. If you have difficulty swallowing the tablet whole please discuss with your pharmacist how to take the medication safely.  Take Eliquis exactly as prescribed by your doctor and DO NOT stop taking Eliquis without talking to the doctor who prescribed the medication.  Stopping may increase your risk of developing a stroke.  Refill your prescription before you run out.  After discharge, you should have regular check-up appointments with your healthcare provider that is prescribing your Eliquis.  In the future your dose may need to be changed if your kidney function or weight changes by a significant amount or as you get older.  What do you do if you miss a dose? If you miss a dose, take it as soon as you remember on the same day and resume taking twice daily.  Do not take more than one dose of ELIQUIS at the same time to make up a missed dose.  Important Safety Information A possible side effect of Eliquis is bleeding. You should call your healthcare provider right away if you experience any of the following: ? Bleeding from an injury or your nose that does not stop. ? Unusual colored urine (red or dark brown) or unusual colored stools (red or black). ? Unusual bruising for unknown reasons. ? A serious fall or if you hit your head (even if there is no bleeding).  Some medicines may interact with  Eliquis and might increase your risk of bleeding or clotting while on Eliquis. To help avoid this, consult your healthcare provider or pharmacist prior to using any new prescription or non-prescription medications, including herbals, vitamins, non-steroidal anti-inflammatory drugs (NSAIDs) and supplements.  This website has more information on Eliquis (apixaban): http://[email or web address].com/eliquis/home

## 2015-06-29 NOTE — Progress Notes (Signed)
Patient Name: Joseph [name] Date of Encounter: 06/29/2015  Active Problems:   HTN (hypertension)   Atrial fibrillation with rapid ventricular response ([place])   Atrial fibrillation ([place])   Length of Stay: 2  SUBJECTIVE  No chest pain or SOB.   CURRENT MEDS . losartan  100 mg Oral Daily  . minoxidil  5 mg Oral Daily   . sodium chloride 10 mL/hr at 06/28/15 0130  . heparin 1,400 Units/hr (06/29/15 0400)   OBJECTIVE  Filed Vitals:   06/28/15 2030 06/28/15 2110 06/29/15 0013 06/29/15 0458  BP: 171/75 160/77 139/91 148/73  Pulse: 51 53 44 51  Temp: 98.1 F (36.7 C)  98.3 F (36.8 C) 97.7 F (36.5 C)  TempSrc: Oral  Oral Oral  Resp: 20 18 18 18   Height:      Weight:      SpO2: 95% 100% 99% 100%    Intake/Output Summary (Last 24 hours) at 06/29/15 1007 Last data filed at 06/29/15 0900  Gross per 24 hour  Intake 630.83 ml  Output   2225 ml  Net -1594.17 ml   Filed Weights   06/28/15 0128  Weight: 223 lb 9.6 oz (101.424 kg)    PHYSICAL EXAM  General: Pleasant, NAD. Neuro: Alert and oriented X 3. Moves all extremities spontaneously. Psych: Normal affect. HEENT:  Normal  Neck: Supple without bruits or JVD. Lungs:  Resp regular and unlabored, CTA. Heart: RRR no s3, s4, or murmurs. Abdomen: Soft, non-tender, non-distended, BS + x 4.  Extremities: No clubbing, cyanosis or edema. DP/PT/Radials 2+ and equal bilaterally.  Accessory Clinical Findings  CBC  Recent Labs  06/28/15 0715 06/29/15 0350  WBC 6.7 5.0  NEUTROABS 4.2  --   HGB 11.4* 11.4*  HCT 35.2* 34.6*  MCV 68.6* 68.0*  PLT 185 [id number]   Basic Metabolic Panel  Recent Labs  06/27/15 2104 06/28/15 0715  NA 140 138  K 4.3 3.6  CL 105 108  CO2 24 24  GLUCOSE 96 110*  BUN 21* 19  CREATININE 1.41* 1.15  CALCIUM 9.2 8.2*   Liver Function Tests  Recent Labs  06/28/15 0715  AST 22  ALT 18  ALKPHOS 69  BILITOT 1.1  PROT 5.3*  ALBUMIN 3.0*   No results for input(s): LIPASE,  AMYLASE in the last 72 hours. Cardiac Enzymes  Recent Labs  06/28/15 0316 06/28/15 0715 06/28/15 1318  TROPONINI 0.21* 0.26* 0.18*    Recent Labs  06/28/15 0316  TSH 0.696  T3FREE 2.8   Radiology/Studies  Dg Chest 2 View  06/27/2015  CLINICAL DATA:  Weakness and tachycardia today. Hypertension. Nonsmoker. EXAM: CHEST  2 VIEW COMPARISON:  None. FINDINGS: Low lung volumes. Normal heart size and pulmonary vascularity. No focal airspace disease or consolidation in the lungs. No blunting of costophrenic angles. No pneumothorax. Mediastinal contours appear intact. Degenerative changes in the spine. IMPRESSION: No active cardiopulmonary disease. Electronically Signed   By: Lucienne Capers M.D.   On: 06/27/2015 22:46    TELE: SR, PVCs    ASSESSMENT AND PLAN  1. Atrial fibrillation with rapid ventricular response (HCC) - New-onset. Unknown etiology. CHADSVASCs score of at least 5 (age, HTN, TIA and diastolic dysfunction). - Recently treated with IV Cardizem which was discontinued due to bradycardia, rate in mid 40s. Currently on IV heparin for anticoagulation. - He will need NOAC.  - TSH normal. -Last echocardiogram 11/07/2014 showed normal LV size with mild LV hypertrophy. EF 55-60%. Moderate diastolic dysfunction. Normal RV  size and systolic function. PA peak pressure: 30 mm Hg (S). Ending reading of recent echo. 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 The patient denies melena or blood in his stool. - cardioverted to SR overnight, now bradycardic.  - we will start eliquis 5 mg po BID if negative stress test - if his a-fib recurrent we should consider Flecainide 50 mg po BID as he doesn't tolerate AVN blocking agents sec to bradycardia  2. Near syncope - Could be from A. fib versus arrhythmia versus cardura (he took at 3 PM prior to going to work - likely etiology).  3. HTN - Cozaar and minoxidil. - Encouraged  to take Cardura at night.  4. History of rheumatic heart disease  5. Elevated troponin -Troponin trend 0.21->0.26->0.18. Likely due to elevated rate. No anginal pain. Patient wants to go home. He will benefit from ischemic evaluation. Will plan for [facility] tomorrow.  - if negative discharge on Eliquis 5 mg po BID. IF positive cath on Monday.   Signed, Dorothy Spark MD, [facility], [place] 06/29/2015

## 2015-06-29 NOTE — Discharge Summary (Signed)
Physician Discharge Summary  Joseph [name] [id number] DOB: 09-19-1945 DOA: 06/27/2015  PCP: Robert Bellow, MD  Admit date: 06/27/2015 Discharge date: 06/29/2015  Time spent: 30 minutes  Recommendations for Outpatient Follow-up:  Discharge home on oral Elliquis 5 mg twice daily for new onset A. fib. Follow-up with cardiology as outpatient. Will arrange follow-up.   Discharge Diagnoses:   Principle problem Near syncope  Active Problems:   HTN (hypertension)   Left ventricular diastolic dysfunction, NYHA class 3   Atrial fibrillation with rapid ventricular response (HCC)   Near syncope   Bradycardia   BPH   Discharge Condition: Fair  Diet recommendation: Low sodium  Filed Weights   06/28/15 0128 06/29/15 1428  Weight: 101.424 kg (223 lb 9.6 oz) 103.148 kg (227 lb 6.4 oz)    History of present illness:  69 year old male with history of hypertension, rheumatic heart disease, ? TIA in 2012 ( MRI showing chronic ischemic changes and multiple areas of microhemorrhage , patient denies any such history) , diastolic dysfunction, was at work when he felt dizzy and almost passed out. 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 He also felt diaphoretic, was nauseous and vomited. When patient was brought to the ED was found to be in A. fib with RVR. He then had an episode of bradycardia. Patient initially placed on Cardizem drip but given the bradycardia was discontinued and started on IV heparin drip. He had mild acute kidney injury and elevated troponin. Admitted to hospitalist service. Cardiology consulted.  Assessment/Plan:  Hospital Course:  Near syncope Possibly associated with bradycardia and orthostasis with use of Cardura in the morning. . Denies any chest pain and troponin peaked at 0.26. (Possibly associated with A. fib post) -.  Discontinued Cardizem drip given bradycardia. -Recent 2-D echo in April of this year showed EF of 55-60% with grade 2 diastolic dysfunction. Repeat echo shows EF of 50-55% with pseudo-normal left ventricular filling pattern with abnormal relaxation and a 2 diastolic dysfunction. -Urine drug screen and TSH normal. Pt brady to 40s and 50s but asymptomatic.  -underwent Lexiscan Myoview today which showed nonreversible decreased myocardial perfusion of the inferior wall of the left ventricle with EF of 42%. Discussed results with cardiology (Dr. Meda Coffee) who reviewed the images and suggested that he had normal EF and given nonreversible ischemia he was stable to be discharged home and will arrange outpatient follow-up.   New-onset A. fib with RVR Currently bradycardic. On IV heparin infusion. His chads vascular score is 5 . Will require anticoagualtion . Discussed anticoagulation options and agrees with using eliquis. I will discharge him on Eliquis 5 mg bid. Clear instructions provided on use, benefits and risks.  . Essential hypertension Continue Cozaar and minoxidil.  History of rheumatic heart disease Follows with Dr. Claiborne Billings   BPH On cardura which can be resumed. (Instructed to take his medications during evening only to prevent orthostasis)   Diet: Heart healthy  Code Status: Full code Family Communication: Wife at bedside Disposition Plan: Home today if cardiac cath not needed   Consultants:  Cardiology  Procedures:  2-D echo  Antibiotics:  None  Discharge Exam: Filed Vitals:   06/29/15 1147 06/29/15 1428  BP: 161/73 163/62  Pulse: 60 60  Temp:  98.4 F (36.9 C)  Resp:  18     General: Elderly male not in distress  HEENT:  No pallor, moist mucosa, supple neck  Chest: Clear to auscultation bilaterally  CVS: S1 and S2 bradycardic, no murmurs or gallop  GI: Soft, nondistended, nontender, bowel sounds present  Musculoskeletal: Warm, no edema  CNS: alert and  oreinted  Discharge Instructions    Current Discharge Medication List    START taking these medications   Details  apixaban (ELIQUIS) 5 MG TABS tablet Take 1 tablet (5 mg total) by mouth 2 (two) times daily. Qty: 60 tablet, Refills: 0      CONTINUE these medications which have NOT CHANGED   Details  Doxazosin (CARDURA) 4 MG tablet Take 8 mg by mouth at bedtime.     losartan (COZAAR) 100 MG tablet Take 100 mg by mouth daily.    minoxidil (LONITEN) 2.5 MG tablet Take 5 mg by mouth daily.   Amlodipine 5 mg po tablet                    Take 1 tablet ( 5 mg ) daily  saw palmetto 160 MG capsule Take 160 mg by mouth 2 (two) times daily.              STOP taking these medications             No Known Allergies Follow-up Information    Follow up with Robert Bellow, MD. Schedule an appointment as soon as possible for a visit in 1 week.   Specialty:  Family Medicine   Contact information:   [place] [address] [phone]       Follow up with Troy Sine, MD.   Specialty:  Cardiology   Why:  will arrange follow up as outpt   Contact information:   [address] [place]  [phone]        The results of significant diagnostics from this hospitalization (including imaging, microbiology, ancillary and laboratory) are listed below for reference.    Significant Diagnostic Studies: Dg Chest 2 View  06/27/2015  CLINICAL DATA:  Weakness and tachycardia today. Hypertension. Nonsmoker. EXAM: CHEST  2 VIEW COMPARISON:  None. FINDINGS: Low lung volumes. Normal heart size and pulmonary vascularity. No focal airspace disease or consolidation in the lungs. No blunting of costophrenic angles. No pneumothorax. Mediastinal contours appear intact. Degenerative changes in the spine. IMPRESSION: No active cardiopulmonary disease. Electronically Signed   By: Lucienne Capers M.D.   On: 06/27/2015 22:46   Nm Myocar Multi  W/spect W/wall Motion / Ef  06/29/2015  CLINICAL DATA:  Elevated troponins, stroke, hypertension, family history of heart disease, first degree AV block, diastolic dysfunction EXAM: MYOCARDIAL IMAGING WITH SPECT (REST AND PHARMACOLOGIC-STRESS) GATED LEFT VENTRICULAR WALL MOTION STUDY LEFT VENTRICULAR EJECTION FRACTION TECHNIQUE: Standard myocardial SPECT imaging was performed after resting intravenous injection of 10 mCi Tc-8m sestamibi. Subsequently, intravenous infusion of Lexiscan was performed under the supervision of the Cardiology staff. At peak effect of the drug, 30 mCi Tc-45m sestamibi was injected intravenously and standard myocardial SPECT imaging was performed. Quantitative gated imaging was also performed to evaluate left ventricular wall motion, and estimate left ventricular ejection fraction. COMPARISON:  None. FINDINGS: Perfusion: Non reversible decreased myocardial perfusion inferior wall LEFT ventricle. Wall Motion: Dilated LEFT ventricle. No focal wall motion abnormalities. Left Ventricular Ejection Fraction: 42 % End diastolic volume [id number] ml End systolic volume [id number] ml IMPRESSION: 1. Non reversible decreased myocardial perfusion inferior wall LEFT ventricle. 2. Dilated LEFT ventricle without focal wall motion abnormalities. 3. Left ventricular  ejection fraction 42% 4. Intermediate-risk stress test findings*. *2012 Appropriate Use Criteria for Coronary Revascularization Focused Update: J Am Coll Cardiol. N6492421. http://content.airportbarriers.com.aspx?articleid=[email or web address] Electronically Signed   By: Lavonia Dana M.D.   On: 06/29/2015 15:56    Microbiology: Recent Results (from the past 240 hour(s))  MRSA PCR Screening     Status: None   Collection Time: 06/28/15  1:36 AM  Result Value Ref Range Status   MRSA by PCR NEGATIVE NEGATIVE Final    Comment:        The GeneXpert MRSA Assay (FDA approved for NASAL specimens only), is one component of a comprehensive MRSA  colonization surveillance program. It is not intended to diagnose MRSA infection nor to guide or monitor treatment for MRSA infections.      Labs: Basic Metabolic Panel:  Recent Labs Lab 06/27/15 2104 06/28/15 0715  NA 140 138  K 4.3 3.6  CL 105 108  CO2 24 24  GLUCOSE 96 110*  BUN 21* 19  CREATININE 1.41* 1.15  CALCIUM 9.2 8.2*   Liver Function Tests:  Recent Labs Lab 06/28/15 0715  AST 22  ALT 18  ALKPHOS 69  BILITOT 1.1  PROT 5.3*  ALBUMIN 3.0*   No results for input(s): LIPASE, AMYLASE in the last 168 hours. No results for input(s): AMMONIA in the last 168 hours. CBC:  Recent Labs Lab 06/27/15 2104 06/28/15 0715 06/29/15 0350  WBC 5.3 6.7 5.0  NEUTROABS  --  4.2  --   HGB 13.4 11.4* 11.4*  HCT 40.7 35.2* 34.6*  MCV 68.3* 68.6* 68.0*  PLT 189 185 163   Cardiac Enzymes:  Recent Labs Lab 06/28/15 0316 06/28/15 0715 06/28/15 1318  TROPONINI 0.21* 0.26* 0.18*   BNP: BNP (last 3 results) No results for input(s): BNP in the last 8760 hours.  ProBNP (last 3 results) No results for input(s): PROBNP in the last 8760 hours.  CBG:  Recent Labs Lab 06/27/15 2101  GLUCAP 89       Signed:  Miner Koral  [facility] 06/29/2015, 4:54 PM

## 2015-06-29 NOTE — Progress Notes (Signed)
Patient discharging home on Eliquis.  Confirmed with [facility] in [place] on [address]. Medication is available for pick up in am.  Sanda Linger

## 2015-06-29 NOTE — Progress Notes (Signed)
ANTICOAGULATION CONSULT NOTE - Initial Consult  Pharmacy Consult for Heparin Indication: atrial fibrillation  No Known Allergies  Patient Measurements: Height: 5\' 9"  (175.3 cm) Weight: 223 lb 9.6 oz (101.424 kg) IBW/kg (Calculated) : 70.7 Heparin Dosing Weight: 92 kg  Vital Signs: Temp: 97.7 F (36.5 C) (12/17 0458) Temp Source: Oral (12/17 0458) BP: 161/73 mmHg (12/17 1147) Pulse Rate: 60 (12/17 1147)  Labs:  Recent Labs  06/27/15 2104 06/28/15 0316 06/28/15 0715 06/28/15 1318 06/29/15 0350  HGB 13.4  --  11.4*  --  11.4*  HCT 40.7  --  35.2*  --  34.6*  PLT 189  --  185  --  163  HEPARINUNFRC  --   --  0.54 0.51 0.67  CREATININE 1.41*  --  1.15  --   --   TROPONINI  --  0.21* 0.26* 0.18*  --     Estimated Creatinine Clearance: 73.2 mL/min (by C-G formula based on Cr of 1.15).   Medical History: Past Medical History  Diagnosis Date  . Hypertension   . 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 RBBB   . 1St degree AV block   . Sinus bradycardia   . Diastolic dysfunction 123456    grade 1    Medications:  Prescriptions prior to admission  Medication Sig Dispense Refill Last Dose  . amLODipine (NORVASC) 10 MG tablet TAKE 1 TABLET BY MOUTH DAILY 30 tablet 11 06/26/2015 at Unknown time  . doxazosin (CARDURA) 4 MG tablet Take 8 mg by mouth at bedtime.    06/27/2015 at Unknown time  . losartan (COZAAR) 100 MG tablet Take 100 mg by mouth daily.   06/27/2015 at Unknown time  . minoxidil (LONITEN) 2.5 MG tablet Take 5 mg by mouth daily.    06/26/2015 at Unknown time  . saw palmetto 160 MG capsule Take 160 mg by mouth 2 (two) times daily.   06/27/2015 at Unknown time    Assessment: 69 y.o. M presents with afib, syncope. No anticoagulants PTA. HL remains therapeutic on 1400 units/hr. No bleeding reported.  Goal of Therapy:  Heparin level 0.3-0.7 units/ml Monitor platelets by anticoagulation protocol: Yes   Plan:  Cont heparin gtt at 1400 units/hr Daily heparin level  and CBC F/u plans for switch to [place], PharmD Clinical Pharmacy Resident Pager # [phone] 06/29/2015 1:25 PM

## 2015-07-01 ENCOUNTER — Telehealth: Payer: Self-pay | Admitting: Cardiovascular Disease

## 2015-07-01 NOTE — Telephone Encounter (Signed)
Routed for results interpretation.

## 2015-07-01 NOTE — Telephone Encounter (Signed)
New message       Pt was in the hosp over the weekend.  He was stress test results.

## 2015-07-11 NOTE — Telephone Encounter (Signed)
Inferior defect; no ischemia; EF 42%; LV dilated; intermediate risk

## 2015-07-18 ENCOUNTER — Encounter: Payer: Self-pay | Admitting: Physician Assistant

## 2015-07-18 ENCOUNTER — Ambulatory Visit (INDEPENDENT_AMBULATORY_CARE_PROVIDER_SITE_OTHER): Payer: Medicare Other | Admitting: Physician Assistant

## 2015-07-18 VITALS — BP 148/86 | HR 60 | Ht 70.0 in | Wt 226.3 lb

## 2015-07-18 DIAGNOSIS — I5189 Other ill-defined heart diseases: Secondary | ICD-10-CM

## 2015-07-18 DIAGNOSIS — I10 Essential (primary) hypertension: Secondary | ICD-10-CM

## 2015-07-18 DIAGNOSIS — I480 Paroxysmal atrial fibrillation: Secondary | ICD-10-CM | POA: Insufficient documentation

## 2015-07-18 DIAGNOSIS — R001 Bradycardia, unspecified: Secondary | ICD-10-CM

## 2015-07-18 DIAGNOSIS — I519 Heart disease, unspecified: Secondary | ICD-10-CM | POA: Diagnosis not present

## 2015-07-18 MED ORDER — APIXABAN 5 MG PO TABS: 5.0000 mg | ORAL_TABLET | Freq: Two times a day (BID) | ORAL | Status: DC

## 2015-07-18 NOTE — Progress Notes (Signed)
Patient ID: Joseph [name], male   DOB: 06-06-1946, 70 y.o.   MRN: [id number]    Date:  07/18/2015   ID:  Joseph [name], DOB 1946/04/08, MRN [id number]  PCP:  Robert Bellow, MD  Primary Cardiologist:  Claiborne Billings   Chief Complaint  Patient presents with  . Follow-up    no chest pain, no shortness of breath, no edema, no pain in legs, no cramping in legs, no lightheadedness or dizziness     History of Present Illness: Joseph [name] is a 70 y.o. male history of atrial fibrillation, hypertension, rheumatic heart disease, right bundle branch block, bradycardia, diastolic dysfunction. He was admitted in December 2016 with atrial fibrillation with a rapid ventricular response. This was new for him.CHADSVASC 5.  Was started on IV Cardizem which was discontinued due to bradycardia into the 40s. TSH was normal.echocardiogram 06/28/2015 revealing ejection fraction of 50-55%. Image quality was poor. Mild LVH. Grade 2 diastolic dysfunction, left atrium mildly dilated. Right ventricle mildly dilated the right atrium mildly dilated.had a mildly elevated troponin and no angina. He was discharged on an Ellik was 5 mg and amlodipine 10 mg.  He had a stress test before discharge which revealed nonreversible D Priest myocardial perfusion the inferior wall.  Patient presents for follow-up.  Patient has no particular complaints and currently denies nausea, vomiting, fever, chest pain, shortness of breath, orthopnea, dizziness, PND, cough, congestion, abdominal pain, hematochezia, melena, lower extremity edema, claudication.  He reports that he does a fair amount of strenuous activity in the form of cutting wood with a chainsaw and walks up with 3.5 miles 3 times a week. Other than that he does not do any regularly scheduled weightlifting.    Wt Readings from Last 3 Encounters:  07/18/15 226 lb 5 oz (102.655 kg)  06/29/15 227 lb 6.4 oz (103.148 kg)  10/26/14 222 lb 1.6 oz (100.744 kg)     Past Medical  History  Diagnosis Date  . Hypertension   . 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 RBBB   . 1St degree AV block   . Sinus bradycardia   . Diastolic dysfunction 123456    grade 1  . New onset atrial fibrillation ([place]) 06/2015    Current Outpatient Prescriptions  Medication Sig Dispense Refill  . amLODipine (NORVASC) 10 MG tablet TAKE 1 TABLET BY MOUTH DAILY 30 tablet 11  . apixaban (ELIQUIS) 5 MG TABS tablet Take 1 tablet (5 mg total) by mouth 2 (two) times daily. 60 tablet 0  . doxazosin (CARDURA) 4 MG tablet Take 8 mg by mouth at bedtime.     Marland Kitchen losartan (COZAAR) 100 MG tablet Take 100 mg by mouth daily.    . minoxidil (LONITEN) 2.5 MG tablet Take 5 mg by mouth daily.     . saw palmetto 160 MG capsule Take 160 mg by mouth 2 (two) times daily.     No current facility-administered medications for this visit.    Allergies:   No Known Allergies  Social History:  The patient  reports that he has quit smoking. He does not have any smokeless tobacco history on file. He reports that he drinks alcohol. He reports that he does not use illicit drugs.   Family history:   Family History  Problem Relation Age of Onset  . Colon cancer Neg Hx   . Cancer Brother   . CVA Mother   . Hypertension Father     ROS:  Please see the history of present illness.  All other systems reviewed and negative.   PHYSICAL EXAM: VS:  BP 148/86 mmHg  Pulse 60  Ht 5\' 10"  (1.778 m)  Wt 226 lb 5 oz (102.655 kg)  BMI 32.47 kg/m2 Well nourished, well developed, in no acute distress HEENT: Pupils are equal round react to light accommodation extraocular movements are intact.  Neck: no JVDNo cervical lymphadenopathy. Cardiac: Regular rate and rhythm without murmurs rubs or gallops. Lungs:  clear to auscultation bilaterally, no wheezing, rhonchi or rales Abd: soft, nontender, positive bowel sounds all quadrants, no hepatosplenomegaly Ext: no lower extremity edema.  2+ radial and dorsalis pedis pulses. Skin: warm and  dry Neuro:  Grossly normal    ASSESSMENT AND PLAN:  Problem List Items Addressed This Visit    Sinus bradycardia (Chronic)   Paroxysmal atrial fibrillation (HCC)   HTN (hypertension) - Primary (Chronic)   Diastolic dysfunction- grade 2- echo 06/28/15 (Chronic)      Patient is doing well. He is maintaining sinus rhythm rate of 60 bpm. He is on eliquis 5 mg along with Cozaar 100 mg and amlodipine 10 mg.  We discussed diet and exercise in detail. He appears euvolemic. Will follow-up in 3 months with Dr. Claiborne Billings

## 2015-07-18 NOTE — Patient Instructions (Signed)
Your physician recommends that you schedule a follow-up appointment in: 3 months with Dr. Kelly. 

## 2015-11-26 ENCOUNTER — Encounter: Payer: Self-pay | Admitting: Orthopaedic Surgery

## 2015-11-26 ENCOUNTER — Ambulatory Visit (INDEPENDENT_AMBULATORY_CARE_PROVIDER_SITE_OTHER): Payer: BLUE CROSS/BLUE SHIELD | Admitting: Orthopaedic Surgery

## 2015-11-26 VITALS — BP 167/70 | HR 56 | Temp 97.9°F | Ht 69.0 in | Wt 220.0 lb

## 2015-11-26 DIAGNOSIS — M79642 Pain in left hand: Secondary | ICD-10-CM | POA: Diagnosis not present

## 2015-11-26 DIAGNOSIS — M79672 Pain in left foot: Secondary | ICD-10-CM | POA: Diagnosis not present

## 2015-11-26 NOTE — Progress Notes (Signed)
Subjective:  I have a place on my left foot    Patient ID: Joseph [name], male    DOB: Sep 21, 1945, 70 y.o.   MRN: [id number]  Foot Pain This is a chronic problem. The current episode started more than 1 month ago. The problem occurs intermittently. The problem has been waxing and waning. Associated symptoms include arthralgias. Pertinent negatives include no chest pain, congestion or coughing. Nothing aggravates the symptoms. Joseph [name] has tried acetaminophen for the symptoms. The treatment provided mild relief.   Joseph [name] has noted a small swelling of the left foot on the medial side near the head of the great toe metacarpal.  Joseph [name] noticed it six months ago.  It is not growing, it is not red, it does not hurt Joseph [name] is just concerned about it.  Joseph [name] has not changed shoe wear.  Joseph [name] has no trauma.  Joseph [name] also asked about a spot on the left nondominant hand on the palm that is not swollen but it bothers him once in a while.  Joseph [name] has hypertension well controlled. Joseph [name] has had heart problems and heart block.  Review of Systems  HENT: Negative for congestion.   Respiratory: Negative for cough and shortness of breath.   Cardiovascular: Negative for chest pain and leg swelling.  Endocrine: Negative for cold intolerance.  Musculoskeletal: Positive for arthralgias.  Allergic/Immunologic: Negative for environmental allergies.   Past Medical History  Diagnosis Date  . Hypertension   . 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 RBBB   . 1St degree AV block   . Sinus bradycardia   . Diastolic dysfunction 123456    grade 1  . New onset atrial fibrillation ([place]) 06/2015    Past Surgical History  Procedure Laterality Date  . Colonoscopy    . Colonoscopy  12/30/2011    Procedure: COLONOSCOPY;  Surgeon: [name], MD;  Location: [facility];  Service: Endoscopy;  Laterality: N/A;  1:45PM    Current Outpatient Prescriptions on File Prior to Visit  Medication Sig Dispense Refill  . amLODipine (NORVASC) 10 MG tablet TAKE  1 TABLET BY MOUTH DAILY 30 tablet 11  . apixaban (ELIQUIS) 5 MG TABS tablet Take 1 tablet (5 mg total) by mouth 2 (two) times daily. 84 tablet 0  . doxazosin (CARDURA) 4 MG tablet Take 8 mg by mouth at bedtime.     Marland Kitchen losartan (COZAAR) 100 MG tablet Take 100 mg by mouth daily.    . minoxidil (LONITEN) 2.5 MG tablet Take 5 mg by mouth daily.     . saw palmetto 160 MG capsule Take 160 mg by mouth 2 (two) times daily.     No current facility-administered medications on file prior to visit.    Social History   Social History  . Marital Status: Married    Spouse Name: N/A  . Number of Children: N/A  . Years of Education: N/A   Occupational History  . Not on file.   Social History Main Topics  . Smoking status: Former Smoker -- 0.00 packs/day for 4 years  . Smokeless tobacco: Not on file     Comment: quit about 40 yrs ago  . Alcohol Use: 0.0 oz/week    0 Standard drinks or equivalent per week     Comment: 6 pack of beer in a week  . Drug Use: No  . Sexual Activity: Not on file   Other Topics Concern  . Not on file   Social History Narrative  BP 167/70 mmHg  Pulse 56  Temp(Src) 97.9 F (36.6 C)  Ht 5\' 9"  (1.753 m)  Wt 220 lb (99.791 kg)  BMI 32.47 kg/m2     Objective:   Physical Exam  Constitutional: Joseph [name] is oriented to person, place, and time. Joseph [name] appears well-developed and well-nourished.  HENT:  Head: Normocephalic and atraumatic.  Eyes: Conjunctivae and EOM are normal. Pupils are equal, round, and reactive to light.  Neck: Normal range of motion. Neck supple.  Cardiovascular: Normal rate, regular rhythm and intact distal pulses.   Pulmonary/Chest: Effort normal.  Abdominal: Soft.  Musculoskeletal: Joseph [name] exhibits tenderness (the left foot has a 1 cm x 1 cm round flatish lesion on the medial side of the foot near the first metatarsal head but does not affect his gait at all.  NV is intact.  ).  Neurological: Joseph [name] is alert and oriented to person, place, and time. Joseph [name] has  normal reflexes. No cranial nerve deficit. Joseph [name] exhibits normal muscle tone. Coordination normal.  Skin: Skin is warm and dry.  Psychiatric: Joseph [name] has a normal mood and affect. His behavior is normal. Judgment and thought content normal.   His left hand is negative.  Joseph [name] has an area that bothers him now and then but it not tender at all today in the palm area near the fourth metacarpal head area.  NV is intact. ROM is full       Assessment & Plan:   Encounter Diagnoses  Name Primary?  . Left foot pain Yes  . Left hand pain     I told him to watch the foot lesion.  If it gets bigger or bothers him, it can be removed, otherwise do not disturb it.  Call if any problem  I will see him as needed.

## 2015-11-27 ENCOUNTER — Ambulatory Visit: Payer: Medicare Other | Admitting: Orthopaedic Surgery

## 2015-12-05 ENCOUNTER — Other Ambulatory Visit: Payer: Self-pay | Admitting: Cardiovascular Disease

## 2015-12-06 NOTE — Telephone Encounter (Signed)
REFILL 

## 2016-03-19 ENCOUNTER — Other Ambulatory Visit: Payer: Self-pay | Admitting: Cardiovascular Disease

## 2016-05-07 ENCOUNTER — Other Ambulatory Visit: Payer: Self-pay | Admitting: Cardiovascular Disease

## 2016-10-19 ENCOUNTER — Other Ambulatory Visit: Payer: Self-pay | Admitting: Cardiovascular Disease

## 2016-10-20 NOTE — Telephone Encounter (Signed)
REFILL 

## 2016-11-17 ENCOUNTER — Encounter: Payer: Self-pay | Admitting: Internal Medicine

## 2016-11-26 ENCOUNTER — Encounter: Payer: Self-pay | Admitting: Orthopaedic Surgery

## 2016-11-26 ENCOUNTER — Ambulatory Visit (INDEPENDENT_AMBULATORY_CARE_PROVIDER_SITE_OTHER): Payer: Medicare Other | Admitting: Orthopaedic Surgery

## 2016-11-26 VITALS — BP 188/96 | HR 53 | Ht 69.0 in | Wt 222.0 lb

## 2016-11-26 DIAGNOSIS — M79672 Pain in left foot: Secondary | ICD-10-CM

## 2016-11-26 NOTE — Progress Notes (Signed)
Patient Joseph [name], male DOB:[birth year], 71 y.o. FAO:[id number]  Chief Complaint  Patient presents with  . Foot Pain    left    HPI  MONTAE [name] is a 71 y.o. male who has a lesion on the medial side of the left foot on the instep area just proximal to the first metatarsal head area. He feels it has grown in size since last here.  He is considering possible surgery of the lesion.    He says it bothers him a little more but not markedly.  We talked about pros and cons of excision of the lesion.  He asked appropriate questions.  I no longer do surgery and he would need to see Dr. Aline Brochure if he decides to do the elective procedure.  He has pain also of the metatarsal heads, he says he feels at times he has a web type foot as the lateral toes seem to be bunched up together.  I have explained about his loss of the metatarsal arch and have recommended a support orthotic with metatarsal pad built in it.  He has slightly increased callus of the foot over the metatarsal head areas.  He has no trauma, no numbness. HPI  Body mass index is 32.78 kg/m.  ROS  Review of Systems  HENT: Negative for congestion.   Respiratory: Negative for cough and shortness of breath.   Cardiovascular: Negative for chest pain and leg swelling.  Endocrine: Negative for cold intolerance.  Musculoskeletal: Positive for arthralgias.  Allergic/Immunologic: Negative for environmental allergies.    Past Medical History:  Diagnosis Date  . 1st degree AV block   . 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 Diastolic dysfunction 6/96   grade 1  . Hypertension   . New onset atrial fibrillation ([place]) 06/2015  . RBBB   . Sinus bradycardia     Past Surgical History:  Procedure Laterality Date  . COLONOSCOPY    . COLONOSCOPY  12/30/2011   Procedure: COLONOSCOPY;  Surgeon: [name], MD;  Location: [facility];  Service: Endoscopy;  Laterality: N/A;  1:45PM    Family History  Problem Relation Age of  Onset  . Colon cancer Neg Hx   . Cancer Brother   . CVA Mother   . Hypertension Father     Social History Social History  Substance Use Topics  . Smoking status: Former Smoker    Packs/day: 0.00    Years: 4.00  . Smokeless tobacco: Never Used     Comment: quit about 40 yrs ago  . Alcohol use 0.0 oz/week     Comment: 6 pack of beer in a week    No Known Allergies  Current Outpatient Prescriptions  Medication Sig Dispense Refill  . amLODipine (NORVASC) 10 MG tablet Take 1 tablet (10 mg total) by mouth daily. NEED OV. 30 tablet 0  . apixaban (ELIQUIS) 5 MG TABS tablet Take 1 tablet (5 mg total) by mouth 2 (two) times daily. 84 tablet 0  . doxazosin (CARDURA) 4 MG tablet Take 8 mg by mouth at bedtime.     Marland Kitchen losartan (COZAAR) 100 MG tablet Take 100 mg by mouth daily.    . minoxidil (LONITEN) 2.5 MG tablet Take 5 mg by mouth daily.     . saw palmetto 160 MG capsule Take 160 mg by mouth 2 (two) times daily.     No current facility-administered medications for this visit.      Physical Exam  Blood pressure (!) 188/96,  pulse (!) 53, height 5\' 9"  (1.753 m), weight 222 lb (100.7 kg).  Constitutional: overall normal hygiene, normal nutrition, well developed, normal grooming, normal body habitus. Assistive device:none  Musculoskeletal: gait and station Limp none, muscle tone and strength are normal, no tremors or atrophy is present.  .  Neurological: coordination overall normal.  Deep tendon reflex/nerve stretch intact.  Sensation normal.  Cranial nerves II-XII intact.   Skin:   Normal overall no scars, lesions, ulcers or rashes. No psoriasis.  Psychiatric: Alert and oriented x 3.  Recent memory intact, remote memory unclear.  Normal mood and affect. Well groomed.  Good eye contact.  Cardiovascular: overall no swelling, no varicosities, no edema bilaterally, normal temperatures of the legs and arms, no clubbing, cyanosis and good capillary refill.  Lymphatic: palpation is  normal.  The left foot medially just proximal to the first metatarsal head area has a lesion that has increased in size since last visit to about the size of a compressed grape or 2 x 2.5 cm, oval size.  It is not tender and not moveable.  It is not fluctuant.  It is not red.  There is no increased warmth.  NV intact.  ROM of the great toe is normal.  He has loss of the metatarsal arch on the left foot with prominence of the metatarsal head on the sole of the foot and some increased callus formation on the sole.  He has no single area of tenderness.  He has no swelling or redness.  Gait is normal.  NV intact. ROM of the toes is normal and he has no hammertoe deformity.    The patient has been educated about the nature of the problem(s) and counseled on treatment options.  The patient appeared to understand what I have discussed and is in agreement with it.  Encounter Diagnosis  Name Primary?  . Left foot pain Yes    PLAN Call if any problems.  Precautions discussed.  Continue current medications.   Return to clinic prn.  He will decide if he wants the lesion of the left foot excised and contact Dr. Aline Brochure for appointment.  I will see him as needed.   Electronically Signed Sanjuana Kava, MD 5/17/20189:59 AM

## 2016-11-27 ENCOUNTER — Other Ambulatory Visit: Payer: Self-pay | Admitting: Cardiovascular Disease

## 2016-12-21 ENCOUNTER — Telehealth: Payer: Self-pay | Admitting: Cardiovascular Disease

## 2016-12-21 MED ORDER — AMLODIPINE BESYLATE 10 MG PO TABS: 10.0000 mg | ORAL_TABLET | Freq: Every day | ORAL | 0 refills | Status: DC

## 2016-12-21 NOTE — Telephone Encounter (Signed)
°*  STAT* If patient is at the pharmacy, call can be transferred to refill team.   1. Which medications need to be refilled? (please list name of each medication and dose if known) amlodipine 10 mg  2. Which pharmacy/location (including street and city if local pharmacy) is medication to be sent to?[facility] [place], [place]. [address]  3. Do they need a 30 day or 90 day supply? 90  Patient has an appt to see [name] on 01-01-17 @11 :30am

## 2017-01-01 ENCOUNTER — Encounter: Payer: Self-pay | Admitting: Physician Assistant

## 2017-01-01 ENCOUNTER — Ambulatory Visit (INDEPENDENT_AMBULATORY_CARE_PROVIDER_SITE_OTHER): Payer: Medicare Other | Admitting: Physician Assistant

## 2017-01-01 VITALS — BP 164/72 | HR 51 | Ht 70.5 in | Wt 223.2 lb

## 2017-01-01 DIAGNOSIS — I4510 Unspecified right bundle-branch block: Secondary | ICD-10-CM

## 2017-01-01 DIAGNOSIS — I10 Essential (primary) hypertension: Secondary | ICD-10-CM

## 2017-01-01 DIAGNOSIS — R001 Bradycardia, unspecified: Secondary | ICD-10-CM | POA: Diagnosis not present

## 2017-01-01 DIAGNOSIS — I480 Paroxysmal atrial fibrillation: Secondary | ICD-10-CM

## 2017-01-01 DIAGNOSIS — I519 Heart disease, unspecified: Secondary | ICD-10-CM

## 2017-01-01 DIAGNOSIS — I5189 Other ill-defined heart diseases: Secondary | ICD-10-CM

## 2017-01-01 MED ORDER — HYDRALAZINE HCL 25 MG PO TABS: 25.0000 mg | ORAL_TABLET | Freq: Two times a day (BID) | ORAL | 1 refills | Status: DC

## 2017-01-01 NOTE — Progress Notes (Signed)
Cardiology Office Note    Date:  01/02/2017   ID:  Joseph, [name] 1946-06-09, MRN [id number]  PCP:  Lemmie Evens, MD  Cardiologist:  Dr. Claiborne Billings   Chief Complaint  Patient presents with  . Follow-up    seen for Dr. Claiborne Billings    History of Present Illness:  Joseph [name] is a 71 y.o. male with PMH of HTN, rheumatic heart disease, RBBB, bradycardia, diastolic dysfunction and history of atrial fibrillation. He was first diagnosed with atrial fibrillation in December 2016. He was started on IV Cardizem, this was later discontinued as he bradycardia down to the 40s. Echocardiogram obtained on 06/28/2015 revealed EF 57-84%, grade 2 diastolic dysfunction. He was discharged on Eliquis. Myoview obtained on 06/29/2015 showed EF 42%, nonreversible decreased myocardial perfusion in the inferior wall, intermediate risk stress test finding.  He presents today for cardiology office visit. He denies any exertional chest discomfort or shortness breath. His blood pressure has been elevated recently. Initially I recommend a hydrochlorothiazide as he has mild lower extremity edema which has been chronic. However he says he is prone to dehydration and was actually on hydrochlorothiazide in the past. Eventually we decided on hydralazine 25 mg twice a day. His baseline bradycardia does not allow me to add any AV nodal blocking agent. He does have a very mild aortic murmur, given lack of symptom, I wished to hold off on echocardiogram at this point.   Past Medical History:  Diagnosis Date  . 1st degree AV block   . 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 Diastolic dysfunction 6/96   grade 1  . Hypertension   . New onset atrial fibrillation ([place]) 06/2015  . RBBB   . Sinus bradycardia     Past Surgical History:  Procedure Laterality Date  . COLONOSCOPY    . COLONOSCOPY  12/30/2011   Procedure: COLONOSCOPY;  Surgeon: [name], MD;  Location[facility];  Service: Endoscopy;  Laterality: N/A;   1:45PM    Current Medications: Outpatient Medications Prior to Visit  Medication Sig Dispense Refill  . amLODipine (NORVASC) 10 MG tablet Take 1 tablet (10 mg total) by mouth daily. KEEP SCHEDULED APPT FOR REFILLS 30 tablet 0  . apixaban (ELIQUIS) 5 MG TABS tablet Take 1 tablet (5 mg total) by mouth 2 (two) times daily. 84 tablet 0  . doxazosin (CARDURA) 4 MG tablet Take 8 mg by mouth at bedtime.     Marland Kitchen losartan (COZAAR) 100 MG tablet Take 100 mg by mouth daily.    . minoxidil (LONITEN) 2.5 MG tablet Take 5 mg by mouth daily.     . saw palmetto 160 MG capsule Take 160 mg by mouth 2 (two) times daily.     No facility-administered medications prior to visit.      Allergies:   Patient has no known allergies.   Social History   Social History  . Marital status: Married    Spouse name: N/A  . Number of children: N/A  . Years of education: N/A   Social History Main Topics  . Smoking status: Former Smoker    Packs/day: 0.00    Years: 4.00  . Smokeless tobacco: Never Used     Comment: quit about 40 yrs ago  . Alcohol use 0.0 oz/week     Comment: 6 pack of beer in a week  . Drug use: No  . Sexual activity: Not Asked   Other Topics Concern  . None   Social  History Narrative  . None     Family History:  The patient's family history includes CVA in his mother; Cancer in his brother; Hypertension in his father.   ROS:   Please see the history of present illness.    ROS All other systems reviewed and are negative.   PHYSICAL EXAM:   VS:  BP (!) 164/72   Pulse (!) 51   Ht 5' 10.5" (1.791 m)   Wt 223 lb 3.2 oz (101.2 kg)   SpO2 98%   BMI 31.57 kg/m    GEN: Well nourished, well developed, in no acute distress  HEENT: normal  Neck: no JVD, carotid bruits, or masses Cardiac: RRR; no rubs, or gallops,no edema  1/6 systolic murmur Respiratory:  clear to auscultation bilaterally, normal work of breathing GI: soft, nontender, nondistended, + BS MS: no deformity or atrophy    Skin: warm and dry, no rash Neuro:  Alert and Oriented x 3, Strength and sensation are intact Psych: euthymic mood, full affect  Wt Readings from Last 3 Encounters:  01/01/17 223 lb 3.2 oz (101.2 kg)  11/26/16 222 lb (100.7 kg)  11/26/15 220 lb (99.8 kg)      Studies/Labs Reviewed:   EKG:  EKG is ordered today.  The ekg ordered today demonstrates Normal sinus rhythm, right bundle branch block. Unchanged from previous EKG.  Recent Labs: No results found for requested labs within last 8760 hours.   Lipid Panel No results found for: CHOL, TRIG, HDL, CHOLHDL, VLDL, LDLCALC, LDLDIRECT  Additional studies/ records that were reviewed today include:    Echo 06/28/2015 LV EF: 50% -   55%  ------------------------------------------------------------------- Indications:      Atrial fibrillation - 427.31.  ------------------------------------------------------------------- History:   PMH:  LBBB.  Risk factors:  Hypertension.  ------------------------------------------------------------------- Study Conclusions  - Procedure narrative: Transthoracic echocardiography. Image   quality was poor. The study was technically difficult, as a   result of poor acoustic windows and poor sound wave transmission. - Left ventricle: Distal lateral wall and distal posterior wall   hypokinesis / akinesis. The cavity size was normal. Wall   thickness was increased in a pattern of mild LVH. Systolic   function was normal. The estimated ejection fraction was in the   range of 50% to 55%. Features are consistent with a pseudonormal   left ventricular filling pattern, with concomitant abnormal   relaxation and increased filling pressure (grade 2 diastolic   dysfunction). - Left atrium: The atrium was mildly dilated. - Right ventricle: The cavity size was mildly dilated. - Right atrium: The atrium was mildly dilated.  Impressions:  - Poor acoustic windows limit study   Myoview  06/29/2015 IMPRESSION: 1. Non reversible decreased myocardial perfusion inferior wall LEFT ventricle.  2. Dilated LEFT ventricle without focal wall motion abnormalities.  3. Left ventricular ejection fraction 42%  4. Intermediate-risk stress test findings*.   ASSESSMENT:    1. Essential hypertension   2. Diastolic dysfunction- grade 1- echo 4/13   3. Sinus bradycardia   4. Paroxysmal atrial fibrillation (H[facility])   5. RBBB      PLAN:  In order of problems listed above:  1. Uncontrolled hypertension: Blood pressure has been in the 160 to 180s recently. Initially I wanted to add hydrochlorothiazide, however patient says he has a history of dehydration on hydrochlorothiazide. He is a slight bradycardia does not allow me to add any beta blocker, I recommend adding hydralazine 25 mg twice a day. I did not use  3 times a day dosing for hydralazine due to concern of compliance. I will refer him to our hypertension clinic. He can follow-up with Dr. Claiborne Billings in 6 month.  2. Baseline bradycardia: Heart rate in the 50s. Does not allow any beta blockers this time.  3. Paroxysmal atrial fibrillation, no obvious recurrence. Continue with eliquis.    Medication Adjustments/Labs and Tests Ordered: Current medicines are reviewed at length with the patient today.  Concerns regarding medicines are outlined above.  Medication changes, Labs and Tests ordered today are listed in the Patient Instructions below. Patient Instructions  Medication Instructions:  Your physician has recommended you make the following change in your medication:  1.  START Hydralazine 25 mg taking 1 tablet twice a day   Labwork: None ordered  Testing/Procedures: None ordered  Follow-Up: Your physician recommends that you schedule a follow-up appointment in: 2-3 [place] BP CHECK   Your physician wants you to follow-up in: [place] DR. Claiborne Billings   You will receive a reminder letter in the  mail two months in advance. If you don't receive a letter, please call our office to schedule the follow-up appointment.   Any Other Special Instructions Will Be Listed Below (If Applicable). Your physician has requested that you regularly monitor and record your blood pressure readings at home. Please use the same machine at the same time of day to check your readings and record them to bring to your follow-up visit. Bring your blood pressure machine with you to your next visit.   Blood Pressure Record Sheet Your blood pressure on this visit to the emergency department or clinic is elevated. This does not necessarily mean you have high blood pressure (hypertension), but it does mean that your blood pressure needs to be rechecked. Many times your blood pressure can increase due to illness, pain, anxiety, or other factors. We recommend that you get a series of blood pressure readings done over a period of 5 days. It is best to get a reading in the morning and one in the evening. You should make sure to sit and relax for 1-5 minutes before the reading is taken. Write the readings down and make a follow-up appointment with your health care provider to discuss the results. If there is not a free clinic or a drug store with a blood-pressure-taking machine near you, you can purchase blood-pressure-taking equipment from a drug store. Having one in the home allows you the convenience of taking your blood pressure while you are home and relaxed. This information is not intended to replace advice given to you by your health care provider. Make sure you discuss any questions you have with your health care provider. Document Released: 03/28/2003 Document Revised: 06/12/2016 Document Reviewed: 08/22/2013 Elsevier Interactive Patient Education  [employer].    If you need a refill on your cardiac medications before your next appointment, please call your pharmacy.     Hilbert Corrigan, [place]  01/02/2017  11:02 AM    [address], [place], [place]  [address] Phone: [phone]; Fax: [phone]

## 2017-01-01 NOTE — Patient Instructions (Addendum)
Medication Instructions:  Your physician has recommended you make the following change in your medication:  1.  START Hydralazine 25 mg taking 1 tablet twice a day   Labwork: None ordered  Testing/Procedures: None ordered  Follow-Up: Your physician recommends that you schedule a follow-up appointment in: 2-3 [place] BP CHECK   Your physician wants you to follow-up in: [place] DR. Claiborne Billings   You will receive a reminder letter in the mail two months in advance. If you don't receive a letter, please call our office to schedule the follow-up appointment.   Any Other Special Instructions Will Be Listed Below (If Applicable). Your physician has requested that you regularly monitor and record your blood pressure readings at home. Please use the same machine at the same time of day to check your readings and record them to bring to your follow-up visit. Bring your blood pressure machine with you to your next visit.   Blood Pressure Record Sheet Your blood pressure on this visit to the emergency department or clinic is elevated. This does not necessarily mean you have high blood pressure (hypertension), but it does mean that your blood pressure needs to be rechecked. Many times your blood pressure can increase due to illness, pain, anxiety, or other factors. We recommend that you get a series of blood pressure readings done over a period of 5 days. It is best to get a reading in the morning and one in the evening. You should make sure to sit and relax for 1-5 minutes before the reading is taken. Write the readings down and make a follow-up appointment with your health care provider to discuss the results. If there is not a free clinic or a drug store with a blood-pressure-taking machine near you, you can purchase blood-pressure-taking equipment from a drug store. Having one in the home allows you the convenience of taking your blood pressure while you are home and  relaxed. This information is not intended to replace advice given to you by your health care provider. Make sure you discuss any questions you have with your health care provider. Document Released: 03/28/2003 Document Revised: 06/12/2016 Document Reviewed: 08/22/2013 Elsevier Interactive Patient Education  [employer].    If you need a refill on your cardiac medications before your next appointment, please call your pharmacy.

## 2017-01-02 ENCOUNTER — Encounter: Payer: Self-pay | Admitting: Physician Assistant

## 2017-01-26 ENCOUNTER — Ambulatory Visit: Payer: Medicare Other

## 2017-01-28 ENCOUNTER — Encounter: Payer: Self-pay | Admitting: Pharmacist Clinician (PhC)/ Clinical Pharmacy Specialist

## 2017-01-28 ENCOUNTER — Ambulatory Visit (INDEPENDENT_AMBULATORY_CARE_PROVIDER_SITE_OTHER): Payer: Medicare Other | Admitting: Pharmacist Clinician (PhC)/ Clinical Pharmacy Specialist

## 2017-01-28 DIAGNOSIS — I10 Essential (primary) hypertension: Secondary | ICD-10-CM

## 2017-01-28 MED ORDER — IRBESARTAN 300 MG PO TABS: ORAL_TABLET | ORAL | 5 refills | Status: DC

## 2017-01-28 NOTE — Assessment & Plan Note (Signed)
Patient with essential hypertension, currently on 4 medications, but not with any consistency.  He is quite hesitant about adding any medications, and was hoping we would discontinue some today.  Explained that we need to get his pressure down to closer to 161 systolic.  Instead of adding other medications, will instead switch his losartan to irbesartan and have him take it each night with doxazosin.  He will take the amlodipine and minoxidil each morning.  Explained that taking a different medication every 6 hours was not the most efficient way to control his pressure.  He will finish off his current supply of losartan, so we will see him back in 6 weeks for follow up.

## 2017-01-28 NOTE — Patient Instructions (Addendum)
Return for a a follow up appointment Thursday Sept 6 at 9:30 am  Stop losartan 100 mg when this bottle is gone and start irbesartan 300 mg  Your blood pressure today is 150/68  (goal is < 130/80)  Check your blood pressure at home daily and keep record of the readings.  Take your BP meds as follows:  AM:  Amlodipine 10 mg, minoxidil 2.5 mg  PM: Irbesartan 300 mg, doxazosin 8 mg  Bring all of your meds, your BP cuff and your record of home blood pressures to your next appointment.  Exercise as you're able, try to walk approximately 30 minutes per day.  Keep salt intake to a minimum, especially watch canned and prepared boxed foods.  Eat more fresh fruits and vegetables and fewer canned items.  Avoid eating in fast food restaurants.    HOW TO TAKE YOUR BLOOD PRESSURE: . Rest 5 minutes before taking your blood pressure. .  Don't smoke or drink caffeinated beverages for at least 30 minutes before. . Take your blood pressure before (not after) you eat. . Sit comfortably with your back supported and both feet on the floor (don't cross your legs). . Elevate your arm to heart level on a table or a desk. . Use the proper sized cuff. It should fit smoothly and snugly around your bare upper arm. There should be enough room to slip a fingertip under the cuff. The bottom edge of the cuff should be 1 inch above the crease of the elbow. . Ideally, take 3 measurements at one sitting and record the average.

## 2017-01-28 NOTE — Progress Notes (Signed)
01/28/2017 Joseph [name] 01-30-1946 [id number]   HPI:  Joseph [name] is a 71 y.o. male patient of [name], with a PMH below who presents today for hypertension clinic evaluation.  His cardiac history is significant for hypertension, rheumatic heart disease, RBBB, bradycardia, diastolic dysfunction and atrial fibrillation.  At his last visit with [name] last month his BP was elevated at 164/72.  They discussed multiple options for treatment, as patient was hesitant to try any type of diuretic and his bradycardia would discourage beta blockers.  They finally agreed upon hydralazine 25 mg twice daily.  The patient returns today for follow up.    He has not started the hydralazine at this time, because of a line in the patient information from the pharmacy that recommends avoiding the medication if you have rheumatic valve disease.  The patient had rheumatic heart disease as a child, but his mitral valve is functioning normally based on last ECHO.    When asked about what times of day he takes his current medications, he notes that because he has 4 medications, he tries to take a different one every 6 hours.  He is not always on time, nor does he take them in the same order each day.  He also admits to missing a dose a few times per week.  We don't have any labs in our system since 2016, so will call his PCP for current metabolic panel.  [name] [phone])  Blood Pressure Goal:  130/80  Current Medications:  Amlodipine 10 mg qd  Doxazosin 8 mg qhs  Losartan 100 mg qd  Minoxidil 2.5 mg qd  Family Hx:  Mother and multiple siblings with hypertension  Father and 1 brother died from MI   Social Hx:  No tobacco, quit after only 3 months when he was young; occasional etoh, rare caffeine  Diet:  Eats most meals at home, no added salt at the table, admits that his wife likes overly salted foods, but is learning to cook with less, does eat occasional fried foods   Exercise:  Walk 3  miles per day 3 days per week  Home BP readings:  Home curf 7-61 years old - [employer] manual pump, read same as office cuff.  Checked twice daily at home, with average of 158/71 in the mornings and 155/70 in evenings.    Wt Readings from Last 3 Encounters:  01/01/17 223 lb 3.2 oz (101.2 kg)  11/26/16 222 lb (100.7 kg)  11/26/15 220 lb (99.8 kg)   BP Readings from Last 3 Encounters:  01/28/17 (!) 150/68  01/01/17 (!) 164/72  11/26/16 (!) 188/96   Pulse Readings from Last 3 Encounters:  01/28/17 (!) 52  01/01/17 (!) 51  11/26/16 (!) 53    Current Outpatient Prescriptions  Medication Sig Dispense Refill  . amLODipine (NORVASC) 10 MG tablet Take 1 tablet (10 mg total) by mouth daily. KEEP SCHEDULED APPT FOR REFILLS 30 tablet 0  . apixaban (ELIQUIS) 5 MG TABS tablet Take 1 tablet (5 mg total) by mouth 2 (two) times daily. 84 tablet 0  . doxazosin (CARDURA) 4 MG tablet Take 8 mg by mouth at bedtime.     . hydrALAZINE (APRESOLINE) 25 MG tablet Take 1 tablet (25 mg total) by mouth 2 (two) times daily. 60 tablet 1  . irbesartan (AVAPRO) 300 MG tablet Take 1 tablet by mouth daily, start after last dose of losartan 30 tablet 5  . losartan (COZAAR) 100 MG tablet  Take 100 mg by mouth daily.    . metoprolol succinate (TOPROL-XL) 25 MG 24 hr tablet Take 25 mg by mouth as needed. For Blood pressure and A-fib    . minoxidil (LONITEN) 2.5 MG tablet Take 5 mg by mouth daily.     . saw palmetto 160 MG capsule Take 160 mg by mouth 2 (two) times daily.     No current facility-administered medications for this visit.     No Known Allergies  Past Medical History:  Diagnosis Date  . 1st degree AV block   . 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 Diastolic dysfunction 1/09   grade 1  . Hypertension   . New onset atrial fibrillation ([place]) 06/2015  . RBBB   . Sinus bradycardia     Blood pressure (!) 150/68, pulse (!) 52.  HTN (hypertension) Patient with essential hypertension, currently on 4  medications, but not with any consistency.  He is quite hesitant about adding any medications, and was hoping we would discontinue some today.  Explained that we need to get his pressure down to closer to 323 systolic.  Instead of adding other medications, will instead switch his losartan to irbesartan and have him take it each night with doxazosin.  He will take the amlodipine and minoxidil each morning.  Explained that taking a different medication every 6 hours was not the most efficient way to control his pressure.  He will finish off his current supply of losartan, so we will see him back in 6 weeks for follow up.     Tommy Medal PharmD CPP [facility]

## 2017-02-19 ENCOUNTER — Other Ambulatory Visit: Payer: Self-pay | Admitting: Cardiovascular Disease

## 2017-03-18 ENCOUNTER — Ambulatory Visit (INDEPENDENT_AMBULATORY_CARE_PROVIDER_SITE_OTHER): Payer: Medicare Other | Admitting: Pharmacist Clinician (PhC)/ Clinical Pharmacy Specialist

## 2017-03-18 DIAGNOSIS — I10 Essential (primary) hypertension: Secondary | ICD-10-CM

## 2017-03-18 NOTE — Progress Notes (Signed)
03/18/2017 [name] 12/06/1945 [id number]   HPI:  Joseph [name] is a 71 y.o. male patient of Joseph [name], with a PMH below who presents today for hypertension clinic follow up.  His cardiac history is significant for hypertension, rheumatic heart disease, RBBB, bradycardia, diastolic dysfunction and atrial fibrillation.  When he saw Joseph [name] his BP was elevated at 164/72.  They discussed multiple options for treatment, as patient was hesitant to try any type of diuretic and his bradycardia would discourage beta blockers.  They finally agreed upon hydralazine 25 mg twice daily.  Patient chose not to take this medication as he noted the drug information had a warning about patients with rheumatic valve disease.  The patient had rheumatic heart disease as a child, and therefore refused to start, but did not call to inform our office.   Based on his last ECHO, the mitral valve is functioning normally so there is no concern.     At his last visit, he had been taking different BP meds at different times of day.  There was no consistency in the order of what he took, he would just take a different med about every 5-6 hours.  Also there was no guarantee that he got all four BP meds in each day.  We gave him a scheduled, whereby he would take the amlodipine and minoxidil each morning and the losartan and doxazosin each night.  He thought he had about 2-3 weeks of losartan, and when those were done, he was to switch to irbesartan 300 mg.    Today he notes that he actually had a 3 month supply of the losartan and has not made the change to irbesartan.  He did, however, move his medications to scheduled times and has improved compliance.    Blood Pressure Goal:  130/80  Current Medications:  Amlodipine 10 mg qd  Doxazosin 8 mg qhs  Losartan 100 mg qd  Minoxidil 2.5 mg qd  Family Hx:  Mother and multiple siblings with hypertension  Father and 1 brother died from MI   Social Hx:  No tobacco,  quit after only 3 months when he was young; occasional etoh, rare caffeine  Diet:  Eats most meals at home, no added salt at the table, admits that his wife likes overly salted foods, but is learning to cook with less, does eat occasional fried foods   Exercise:  Walk 3 miles per day 3 days per week  Home BP readings:  Home curf 73-73 years old - Om[employer] manual pump, read same as office cuff.  Checked twice daily at home, with average of 157/68 in the mornings and 151/69 in evenings.    Wt Readings from Last 3 Encounters:  03/18/17 226 lb (102.5 kg)  01/01/17 223 lb 3.2 oz (101.2 kg)  11/26/16 222 lb (100.7 kg)   BP Readings from Last 3 Encounters:  03/18/17 (!) 148/78  01/28/17 (!) 150/68  01/01/17 (!) 164/72   Pulse Readings from Last 3 Encounters:  03/18/17 (!) 52  01/28/17 (!) 52  01/01/17 (!) 51    Current Outpatient Prescriptions  Medication Sig Dispense Refill  . amLODipine (NORVASC) 10 MG tablet TAKE 1 TABLET(10 MG) BY MOUTH DAILY 90 tablet 3  . apixaban (ELIQUIS) 5 MG TABS tablet Take 1 tablet (5 mg total) by mouth 2 (two) times daily. 84 tablet 0  . doxazosin (CARDURA) 4 MG tablet Take 8 mg by mouth at bedtime.     Marland Kitchen  hydrALAZINE (APRESOLINE) 25 MG tablet Take 1 tablet (25 mg total) by mouth 2 (two) times daily. 60 tablet 1  . irbesartan (AVAPRO) 300 MG tablet Take 1 tablet by mouth daily, start after last dose of losartan 30 tablet 5  . losartan (COZAAR) 100 MG tablet Take 100 mg by mouth daily.    . metoprolol succinate (TOPROL-XL) 25 MG 24 hr tablet Take 25 mg by mouth as needed. For Blood pressure and A-fib    . minoxidil (LONITEN) 2.5 MG tablet Take 5 mg by mouth daily.     . saw palmetto 160 MG capsule Take 160 mg by mouth 2 (two) times daily.     No current facility-administered medications for this visit.     No Known Allergies  Past Medical History:  Diagnosis Date  . 1st degree AV block   . 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 Diastolic dysfunction 0/62    grade 1  . Hypertension   . New onset atrial fibrillation ([place]) 06/2015  . RBBB   . Sinus bradycardia     Blood pressure (!) 148/78, pulse (!) 52, height 5' 10.5" (1.791 m), weight 226 lb (102.5 kg).  HTN (hypertension) Patient with hypertension, now on more regimented schedule and with increased compliance.  Small drop in home BP average.  Will have him switch losartan to irbesartan today, rather than finish off another 6 weeks of losartan.  He is to continue with up to twice daily blood pressure checks and he has an appointment with Joseph. Claiborne [name] in 1 month   Joseph [name] PharmD CPP [place]

## 2017-03-18 NOTE — Assessment & Plan Note (Signed)
Patient with hypertension, now on more regimented schedule and with increased compliance.  Small drop in home BP average.  Will have him switch losartan to irbesartan today, rather than finish off another 6 weeks of losartan.  He is to continue with up to twice daily blood pressure checks and he has an appointment with Dr. Claiborne Billings in 1 month

## 2017-03-18 NOTE — Patient Instructions (Addendum)
Return for a a follow up appointment in 6 weeks  Your blood pressure today is 148/78  (goal is < 130/80)  Check your blood pressure at home once to twice daily and keep record of the readings.  Take your BP meds as follows:  Switch losartan to irbesartan.  Continue with all other medications  Consider looking at information about DASH diet - you can find information on line.  Bring all of your meds, your BP cuff and your record of home blood pressures to your next appointment.  Exercise as you're able, try to walk approximately 30 minutes per day.  Keep salt intake to a minimum, especially watch canned and prepared boxed foods.  Eat more fresh fruits and vegetables and fewer canned items.  Avoid eating in fast food restaurants.    HOW TO TAKE YOUR BLOOD PRESSURE: . Rest 5 minutes before taking your blood pressure. .  Don't smoke or drink caffeinated beverages for at least 30 minutes before. . Take your blood pressure before (not after) you eat. . Sit comfortably with your back supported and both feet on the floor (don't cross your legs). . Elevate your arm to heart level on a table or a desk. . Use the proper sized cuff. It should fit smoothly and snugly around your bare upper arm. There should be enough room to slip a fingertip under the cuff. The bottom edge of the cuff should be 1 inch above the crease of the elbow. . Ideally, take 3 measurements at one sitting and record the average.

## 2017-04-20 ENCOUNTER — Ambulatory Visit (INDEPENDENT_AMBULATORY_CARE_PROVIDER_SITE_OTHER): Payer: Medicare Other | Admitting: Cardiovascular Disease

## 2017-04-20 ENCOUNTER — Encounter: Payer: Self-pay | Admitting: Cardiovascular Disease

## 2017-04-20 VITALS — BP 178/80 | HR 47 | Ht 70.0 in | Wt 220.2 lb

## 2017-04-20 DIAGNOSIS — Z79899 Other long term (current) drug therapy: Secondary | ICD-10-CM | POA: Diagnosis not present

## 2017-04-20 DIAGNOSIS — I5189 Other ill-defined heart diseases: Secondary | ICD-10-CM

## 2017-04-20 DIAGNOSIS — I519 Heart disease, unspecified: Secondary | ICD-10-CM | POA: Diagnosis not present

## 2017-04-20 DIAGNOSIS — I4510 Unspecified right bundle-branch block: Secondary | ICD-10-CM | POA: Diagnosis not present

## 2017-04-20 DIAGNOSIS — I480 Paroxysmal atrial fibrillation: Secondary | ICD-10-CM | POA: Diagnosis not present

## 2017-04-20 DIAGNOSIS — I10 Essential (primary) hypertension: Secondary | ICD-10-CM | POA: Diagnosis not present

## 2017-04-20 DIAGNOSIS — E669 Obesity, unspecified: Secondary | ICD-10-CM

## 2017-04-20 DIAGNOSIS — Z13220 Encounter for screening for lipoid disorders: Secondary | ICD-10-CM

## 2017-04-20 DIAGNOSIS — R001 Bradycardia, unspecified: Secondary | ICD-10-CM

## 2017-04-20 LAB — LIPID PANEL
CHOL/HDL RATIO: 2.2 ratio (ref 0.0–5.0)
CHOLESTEROL TOTAL: 137 mg/dL (ref 100–199)
HDL: 62 mg/dL (ref >39)
LDL CALC: 65 mg/dL (ref 0–99)
TRIGLYCERIDES: 52 mg/dL (ref 0–149)
VLDL Cholesterol Cal: 10 mg/dL (ref 5–40)

## 2017-04-20 LAB — COMPREHENSIVE METABOLIC PANEL
A/G RATIO: 1.3 (ref 1.2–2.2)
ALBUMIN: 4.1 g/dL (ref 3.6–4.8)
ALT: 19 IU/L (ref 0–44)
AST: 26 IU/L (ref 0–40)
Alkaline Phosphatase: 109 IU/L (ref 39–117)
BILIRUBIN TOTAL: 1 mg/dL (ref 0.0–1.2)
BUN / CREAT RATIO: 13 (ref 10–24)
BUN: 14 mg/dL (ref 8–27)
CHLORIDE: 106 mmol/L (ref 96–106)
CO2: 24 mmol/L (ref 20–29)
Calcium: 9.1 mg/dL (ref 8.6–10.2)
Creatinine, Ser: 1.04 mg/dL (ref 0.76–1.27)
GFR calc non Af Amer: 73 mL/min/{1.73_m2} (ref >59)
GFR, EST AFRICAN AMERICAN: 84 mL/min/{1.73_m2} (ref >59)
Globulin, Total: 3.1 g/dL (ref 1.5–4.5)
Glucose: 96 mg/dL (ref 65–99)
POTASSIUM: 4.6 mmol/L (ref 3.5–5.2)
SODIUM: 142 mmol/L (ref 134–144)
TOTAL PROTEIN: 7.2 g/dL (ref 6.0–8.5)

## 2017-04-20 LAB — CBC
Hematocrit: 38.1 % (ref 37.5–51.0)
Hemoglobin: 12.7 g/dL — ABNORMAL LOW (ref 13.0–17.7)
MCH: 21.9 pg — ABNORMAL LOW (ref 26.6–33.0)
MCHC: 33.3 g/dL (ref 31.5–35.7)
MCV: 66 fL — ABNORMAL LOW (ref 79–97)
Platelets: 242 10*3/uL (ref 150–379)
RBC: 5.8 x10E6/uL (ref 4.14–5.80)
RDW: 15.6 % — ABNORMAL HIGH (ref 12.3–15.4)
WBC: 4.3 10*3/uL (ref 3.4–10.8)

## 2017-04-20 LAB — TSH: TSH: 1.12 u[IU]/mL (ref 0.450–4.500)

## 2017-04-20 MED ORDER — HYDRALAZINE HCL 25 MG PO TABS: 25.0000 mg | ORAL_TABLET | Freq: Two times a day (BID) | ORAL | 3 refills | Status: DC

## 2017-04-20 NOTE — Patient Instructions (Signed)
Medication Instructions:  START hydralazine 25 mg (1 tablet) two times daily-prescription has been sent to your pharmacy.  Labwork: TODAY (CMET, CBC, Lipid, TSH)  Testing/Procedures: Your physician has requested that you have a renal artery duplex. During this test, an ultrasound is used to evaluate blood flow to the kidneys. Allow one hour for this exam. Do not eat after midnight the day before and avoid carbonated beverages. Take your medications as you usually do.  Follow-Up: Your physician recommends that you schedule a follow-up appointment in: 3 MONTHS with Dr. Claiborne Billings.    Any Other Special Instructions Will Be Listed Below (If Applicable).     If you need a refill on your cardiac medications before your next appointment, please call your pharmacy.

## 2017-04-20 NOTE — Progress Notes (Signed)
Patient ID: Joseph [name], male   DOB: July 16, 1945, 71 y.o.   MRN: [id number]   Primary M.D.: Dr. Newt Minion  HPI: Joseph [name] is a 71 y.o. male who presents to the office for an 80 month cardiology evaluation.  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 He has a long-standing history of hypertension. A CT scan in 2013 suggested small lacunar infarctions most likely due to hypertension. I initially saw in April 2013 after this CT imaging study was done. He has documented normal systolic function with mild concentric LVH with diastolic dysfunction. An echo Doppler study in April 2013 revealed his mitral valve E/A ratio is 3.8 suggestive of restrictive physiology. He has documented renal cysts involving the left kidney showing a large inferior pole cortical cyst is measured 4.5 x 4.7 x 4.4 cm. In addition there was a second area at the mid pole laterally which appeared multi-septated and measured 2.7 x 2.7 x 1.9. I  recommended urologic evaluation which he did at [facility] and he was told that these were benign.  Joseph [name] has a history of moderate obesity.  In the past.  He had weight as high as 250 pounds.  Over the past several years.  His weight has been in the 220s.  When I last saw him, he was walking 3 miles at a time 3 days per week and denied any exertional chest pain or palpitations.   Over the past several years, he has been seen by [name] and recently was seen by [name], [facility] for blood pressure management.  He last saw [name] on 03/18/2017, who switched losartan to irbesartan.  His blood pressure medications now include amlodipine 10 mg, irbesartan 300 mg, doxazosin 8 mg,minoxidil 5 mg, and Toprol, which she has been taking 25 mg as needed, but has not been taking this regularly.  He denies any episodes of chest pressure.  His last echo Doppler study was in December 2016 which showed mild LVH, EF 50-55% with grade 2 diastolic  dysfunction.  Presently, he admits to his slow pulse and fatigue.  He also has been having issues with possible carpal tunnel of his left hand.  He presents for reevaluation.  Past Medical History:  Diagnosis Date  . 1st degree AV block   . 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 Diastolic dysfunction 3/74   grade 1  . Hypertension   . New onset atrial fibrillation ([place]) 06/2015  . RBBB   . Sinus bradycardia     Past Surgical History:  Procedure Laterality Date  . COLONOSCOPY    . COLONOSCOPY  12/30/2011   Procedure: COLONOSCOPY;  Surgeon: [name], MD;  Location: [facility];  Service: Endoscopy;  Laterality: N/A;  1:45PM    No Known Allergies  Current Outpatient Prescriptions  Medication Sig Dispense Refill  . amLODipine (NORVASC) 10 MG tablet TAKE 1 TABLET(10 MG) BY MOUTH DAILY 90 tablet 3  . apixaban (ELIQUIS) 5 MG TABS tablet Take 1 tablet (5 mg total) by mouth 2 (two) times daily. 84 tablet 0  . doxazosin (CARDURA) 4 MG tablet Take 8 mg by mouth at bedtime.     . irbesartan (AVAPRO) 300 MG tablet Take 1 tablet by mouth daily, start after last dose of losartan 30 tablet 5  . metoprolol succinate (TOPROL-XL) 25 MG 24 hr tablet Take 25 mg by mouth as needed. For Blood pressure and A-fib    . minoxidil (LONITEN) 2.5  MG tablet Take 5 mg by mouth daily.     . saw palmetto 160 MG capsule Take 160 mg by mouth 2 (two) times daily.    . hydrALAZINE (APRESOLINE) 25 MG tablet Take 1 tablet (25 mg total) by mouth 2 (two) times daily. 180 tablet 3   No current facility-administered medications for this visit.     Social History   Social History  . Marital status: Married    Spouse name: N/A  . Number of children: N/A  . Years of education: N/A   Occupational History  . Not on file.   Social History Main Topics  . Smoking status: Former Smoker    Packs/day: 0.00    Years: 4.00  . Smokeless tobacco: Never Used     Comment: quit about 40 yrs ago  . Alcohol use 0.0 oz/week       Comment: 6 pack of beer in a week  . Drug use: No  . Sexual activity: Not on file   Other Topics Concern  . Not on file   Social History Narrative  . No narrative on file    Family History  Problem Relation Age of Onset  . Cancer Brother   . CVA Mother   . Hypertension Father   . Colon cancer Neg Hx     ROS General: Negative; No fevers, chills, or night sweats;  HEENT: Negative; No changes in vision or hearing, sinus congestion, difficulty swallowing Pulmonary: Negative; No cough, wheezing, shortness of breath, hemoptysis Cardiovascular: Negative; No chest pain, presyncope, syncope, palpitations GI: Negative; No nausea, vomiting, diarrhea, or abdominal pain GU: History of renal cysts Musculoskeletal: Negative; no myalgias, joint pain, or weakness Hematologic/Oncology: Negative; no easy bruising, bleeding Endocrine: Negative; no heat/cold intolerance; no diabetes Neuro: Negative; no changes in balance, headaches Skin: Negative; No rashes or skin lesions Psychiatric: Negative; No behavioral problems, depression Sleep: Negative; No snoring, daytime sleepiness, hypersomnolence, bruxism, restless legs, hypnogognic hallucinations, no cataplexy Other comprehensive 14 point system review is negative.   PE BP (!) 178/80   Pulse (!) 47   Ht _0  (1.778 m)   Wt 220 lb 3.2 oz (99.9 kg)   BMI 31.60 kg/m    Repeat blood pressure by me was 154/84.  Wt Readings from Last 3 Encounters:  04/20/17 220 lb 3.2 oz (99.9 kg)  03/18/17 226 lb (102.5 kg)  01/01/17 223 lb 3.2 oz (101.2 kg)   General: Alert, oriented, no distress.  Skin: normal turgor, no rashes HEENT: Normocephalic, atraumatic. Pupils round and reactive; sclera anicteric;no lid lag.  Nose without nasal septal hypertrophy Mouth/Parynx benign; Mallinpatti scale 3 Neck: No JVD, no carotid bruits with normal carotid upstroke Chest wall: Nontender to palpation Lungs: clear to ausculatation and percussion; no  wheezing or rales Heart: RRR, s1 s2 normal , 1/6 systolic murmur.  No diastolic murmur.  No ectopy.  No rubs thrills or heaves Abdomen: No renal bruits. soft, nontender; no hepatosplenomehaly, BS+; abdominal aorta nontender and not dilated by palpation. Back: No CVA tenderness Pulses 2+ Extremities: no clubbing cyanosis or edema, Homan's sign negative  Neurologic: grossly nonfocal Psychological: Normal affect and mood  ECG (independently read by me): Sinus bradycardia at 47 bpm. Accelerated AV conduction with a PR interval at 88 ms.  Right bundle-branch block with repolarization changes.  However, question blocked PACs every other beat.  April 2016 ECG (independently read by me): Sinus bradycardia at 54 bpm with right bundle branch block.  PAC.  QTc interval 479  msec.  PR interval 196 ms.  September 2014 ECG: Sinus bradycardia with first-degree AV block. Heart rate 46 beats per minute. Right bundle branch block, unchanged.  LABS: BMP Latest Ref Rng & Units 04/20/2017 06/28/2015 06/27/2015  Glucose 65 - 99 mg/dL 96 110(H) 96  BUN 8 - 27 mg/dL 14 19 21(H)  Creatinine 0.76 - 1.27 mg/dL 1.04 1.15 1.41(H)  BUN/Creat Ratio 10 - 24 13 - -  Sodium 134 - 144 mmol/L 142 138 140  Potassium 3.5 - 5.2 mmol/L 4.6 3.6 4.3  Chloride 96 - 106 mmol/L 106 108 105  CO2 20 - 29 mmol/L _0 Calcium 8.6 - 10.2 mg/dL 9.1 8.2(L) 9.2   Hepatic Function Latest Ref Rng & Units 04/20/2017 06/28/2015 09/11/2011  Total Protein 6.0 - 8.5 g/dL 7.2 5.3(L) -  Albumin 3.6 - 4.8 g/dL 4.1 3.0(L) -  AST 0 - 40 IU/L _1 ALT 0 - 44 IU/L _2 Alk Phosphatase 39 - 117 IU/L 109 69 97  Total Bilirubin 0.0 - 1.2 mg/dL 1.0 1.1 0.4   CBC Latest Ref Rng & Units 04/20/2017 06/29/2015 06/28/2015  WBC 3.4 - 10.8 x10E3/uL 4.3 5.0 6.7  Hemoglobin 13.0 - 17.7 g/dL 12.7(L) 11.4(L) 11.4(L)  Hematocrit 37.5 - 51.0 % 38.1 34.6(L) 35.2(L)  Platelets 150 - 379 x10E3/uL 242 163 185   Lab Results  Component Value Date   MCV  66 (L) 04/20/2017   MCV 68.0 (L) 06/29/2015   MCV 68.6 (L) 06/28/2015   Lab Results  Component Value Date   TSH 1.120 04/20/2017   No results found for: HGBA1C   Lipid Panel     Component Value Date/Time   CHOL 137 04/20/2017 1102   TRIG 52 04/20/2017 1102   HDL 62 04/20/2017 1102   CHOLHDL 2.2 04/20/2017 1102   LDLCALC 65 04/20/2017 1102    RADIOLOGY: No results found.  IMPRESSION:  1. Essential hypertension   2. Paroxysmal atrial fibrillation (HCC)   3. Medication management   4. Lipid screening   5. Diastolic dysfunction- grade 2 on echo 2016   6. RBBB   7. Mild obesity   8. Bradycardia     ASSESSMENT AND PLAN: Joseph [name] is 71 year old African American male who has a history of hypertension and CT evidence for small lacunar infarctions most likely of hypertensive etiology. There also is a remote history of rheumatic fever age 21. He had developed transient symptoms of right arm weakness with numbness in August 2012 which led to his MRI of his head which showed scattered small white matter hyper intensities bilaterally with also involvement in the right putamen, bilateral thalamus the also he has some additional changes in the pons, right and left inferior cerebellum.  Prior echo Doppler study has shown restrictive physiology.  Since I last saw him over 22 years ago, a follow-up echo Doppler study showed an EF of 50-55%, LVH, and grade 2 diastolic dysfunction.  There was mild biatrial enlargement.  Acoustical window was poor quality.  He is now on a multiple drug regimen consisting of amlodipine 10 mg, doxazosin 4 mg, irbesartan 3 mg, minoxidil 5 mg, and has intermittently been taking Toprol-XL 25 mg.  His blood pressure today is elevated.  This has improved on recheck by me.  I discussed with him in hypertensive guidelines.  He has sinus bradycardia with heart rates in the 40s and complains of significant fatigue.  I will discontinue Toprol.  I  am recommending initiation of  hydralazine to start at 25 mg twice a day, but I suspect this may need to be further titrated.  I'm checking renal Doppler studies to reassess for potential renal vascular etiology of his difficult to control hypertension.  I also discussed potential sleep apnea which may play a role in refractory hypertension.  He states he is sleeping well and has only one episode of nocturia per night.  He does not feel as though he has sleep apnea.  He is maintaining sinus rhythm, but continues to be on eliquis with a remote history of PAF.  Complete laboratory will be obtained.  I will see him in 2-3 months for reevaluation. Time spent: 25 minutes   Troy Sine, MD, [facility]  04/22/2017 6:20 PM

## 2017-05-07 ENCOUNTER — Ambulatory Visit (HOSPITAL_COMMUNITY)
Admission: RE | Admit: 2017-05-07 | Discharge: 2017-05-07 | Disposition: A | Discharge status: Home / Self Care | Payer: Medicare Other | Source: Ambulatory Visit | Attending: Cardiology | Admitting: Cardiology

## 2017-05-07 DIAGNOSIS — Z8673 Personal history of transient ischemic attack (TIA), and cerebral infarction without residual deficits: Secondary | ICD-10-CM | POA: Diagnosis not present

## 2017-05-07 DIAGNOSIS — N281 Cyst of kidney, acquired: Secondary | ICD-10-CM | POA: Insufficient documentation

## 2017-05-07 DIAGNOSIS — I10 Essential (primary) hypertension: Secondary | ICD-10-CM | POA: Diagnosis present

## 2017-05-10 ENCOUNTER — Inpatient Hospital Stay (HOSPITAL_COMMUNITY): Admission: RE | Admit: 2017-05-10 | Payer: Medicare Other | Source: Ambulatory Visit

## 2017-06-14 ENCOUNTER — Telehealth: Payer: Self-pay | Admitting: Student

## 2017-06-14 NOTE — Telephone Encounter (Signed)
     Patient reports he stopped to Hydralazine due to feeling like he was having lower extremity edema due to the medication as the edema improved with him discontinuing it. He has not checked blood pressure regularly but reports it was in the 160's/90's the other day. Recommended he restart the medication at a lower dose of 12.5mg  BID and monitor his symptoms and BP with this adjustment. We also reviewed sodium and fluid restriction.  He will back if BP remains elevated or edema does not improve.   Signed, Erma Heritage, PA-C 06/14/2017, 5:24 PM Pager: [phone]

## 2017-07-21 ENCOUNTER — Encounter: Payer: Self-pay | Admitting: Cardiovascular Disease

## 2017-07-21 ENCOUNTER — Ambulatory Visit (INDEPENDENT_AMBULATORY_CARE_PROVIDER_SITE_OTHER): Payer: Medicare Other | Admitting: Cardiovascular Disease

## 2017-07-21 VITALS — BP 162/64 | HR 47 | Ht 70.0 in | Wt 224.2 lb

## 2017-07-21 DIAGNOSIS — I10 Essential (primary) hypertension: Secondary | ICD-10-CM | POA: Diagnosis not present

## 2017-07-21 DIAGNOSIS — I4510 Unspecified right bundle-branch block: Secondary | ICD-10-CM

## 2017-07-21 DIAGNOSIS — I480 Paroxysmal atrial fibrillation: Secondary | ICD-10-CM | POA: Diagnosis not present

## 2017-07-21 DIAGNOSIS — R001 Bradycardia, unspecified: Secondary | ICD-10-CM

## 2017-07-21 DIAGNOSIS — E669 Obesity, unspecified: Secondary | ICD-10-CM

## 2017-07-21 DIAGNOSIS — I5189 Other ill-defined heart diseases: Secondary | ICD-10-CM

## 2017-07-21 DIAGNOSIS — I519 Heart disease, unspecified: Secondary | ICD-10-CM | POA: Diagnosis not present

## 2017-07-21 DIAGNOSIS — N281 Cyst of kidney, acquired: Secondary | ICD-10-CM

## 2017-07-21 NOTE — Progress Notes (Signed)
Patient ID: Joseph [name], male   DOB: [birth year], 72 y.o.   MRN: [id number]   Primary M.D.: Dr. Newt Minion  HPI: Joseph [name] is a 72 y.o. male who presents to the office for an 3 month cardiology evaluation.  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 He has a long-standing history of hypertension. A CT scan in 2013 suggested small lacunar infarctions most likely due to hypertension. I initially saw in April 2013 after this CT imaging study was done. He has documented normal systolic function with mild concentric LVH with diastolic dysfunction. An echo Doppler study in April 2013 revealed his mitral valve E/A ratio is 3.8 suggestive of restrictive physiology. He has documented renal cysts involving the left kidney showing a large inferior pole cortical cyst is measured 4.5 x 4.7 x 4.4 cm. In addition there was a second area at the mid pole laterally which appeared multi-septated and measured 2.7 x 2.7 x 1.9. I  recommended urologic evaluation which he did at [facility] and he was told that these were benign.  Joseph [name] has a history of moderate obesity.  In the past.  He had weight as high as 250 pounds.  Over the past several years.  His weight has been in the 220s.  When I last saw him, he was walking 3 miles at a time 3 days per week and denied any exertional chest pain or palpitations.   Over the past several years, he has been seen by [name] and also saw [name], [facility] for blood pressure management.  He last saw Joseph [name] on 03/18/2017, who switched losartan to irbesartan.  His blood pressure medications now include amlodipine 10 mg, irbesartan 300 mg, doxazosin 8 mg,minoxidil 5 mg, and Toprol, which she has been taking 25 mg as needed, but has not been taking this regularly.  He denies any episodes of chest pressure.  His last echo Doppler study was in December 2016 which showed mild LVH, EF 50-55% with grade 2 diastolic dysfunction.    When I last saw him in October 2018.  He complained of fatigue and had noted a slow pulse.  He was bradycardic with heart rates in the 40s and I discontinued Toprol.  He was hypertensive and I recommended initiation of hydralazine 25 mg twice a day.  I scheduled him for renal Doppler studies to make certain there was no renal vascular etiology to his hypertension on multiple medical drug regimen.  Renal Doppler study did not demonstrate any evidence for renal artery stenosis.  He again was noted to have previously documented renal cysts with the right upper pole cyst at 1.5 x 1.7 cm, and a left exophytic lower pole cyst at 4.5 x 5.4 cm and left upper pole striated cyst at 3.6 x 2.5 cm.  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  He denies any abdominal complaints.  He had stopped taking hydralazine when he called the office in December due to feeling that he was having lower extremity edema.  He also felt that the hydralazine was contributing to his fatigue.  His edema has improved with diabetic socks.  He presents for follow-up evaluation.   Past Medical History:  Diagnosis Date  . 1st degree AV block   . 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 Diastolic dysfunction 9/92   grade 1  . Hypertension   . New onset  atrial fibrillation ([place]) 06/2015  . RBBB   . Sinus bradycardia     Past Surgical History:  Procedure Laterality Date  . COLONOSCOPY    . COLONOSCOPY  12/30/2011   Procedure: COLONOSCOPY;  Surgeon: [name], MD;  Location: [facility];  Service: Endoscopy;  Laterality: N/A;  1:45PM    No Known Allergies  Current Outpatient Medications  Medication Sig Dispense Refill  . amLODipine (NORVASC) 10 MG tablet TAKE 1 TABLET(10 MG) BY MOUTH DAILY 90 tablet 3  . apixaban (ELIQUIS) 5 MG TABS tablet Take 1 tablet (5 mg total) by mouth 2 (two) times daily. 84 tablet 0  . doxazosin (CARDURA) 4 MG tablet Take 8 mg by mouth at  bedtime.     . irbesartan (AVAPRO) 300 MG tablet Take 1 tablet by mouth daily, start after last dose of losartan 30 tablet 5  . metoprolol succinate (TOPROL-XL) 25 MG 24 hr tablet Take 25 mg by mouth as needed. For Blood pressure and A-fib    . minoxidil (LONITEN) 2.5 MG tablet Take 5 mg by mouth daily.     . saw palmetto 160 MG capsule Take 160 mg by mouth 2 (two) times daily.     No current facility-administered medications for this visit.     Social History   Socioeconomic History  . Marital status: Married    Spouse name: Not on file  . Number of children: Not on file  . Years of education: Not on file  . Highest education level: Not on file  Social Needs  . Financial resource strain: Not on file  . Food insecurity - worry: Not on file  . Food insecurity - inability: Not on file  . Transportation needs - medical: Not on file  . Transportation needs - non-medical: Not on file  Occupational History  . Not on file  Tobacco Use  . Smoking status: Former Smoker    Packs/day: 0.00    Years: 4.00    Pack years: 0.00  . Smokeless tobacco: Never Used  . Tobacco comment: quit about 40 yrs ago  Substance and Sexual Activity  . Alcohol use: Yes    Alcohol/week: 0.0 oz    Comment: 6 pack of beer in a week  . Drug use: No  . Sexual activity: Not on file  Other Topics Concern  . Not on file  Social History Narrative  . Not on file    Family History  Problem Relation Age of Onset  . Cancer Brother   . CVA Mother   . Hypertension Father   . Colon cancer Neg Hx     ROS General: Negative; No fevers, chills, or night sweats;  HEENT: Negative; No changes in vision or hearing, sinus congestion, difficulty swallowing Pulmonary: Negative; No cough, wheezing, shortness of breath, hemoptysis Cardiovascular: Negative; No chest pain, presyncope, syncope, palpitations GI: Negative; No nausea, vomiting, diarrhea, or abdominal pain GU: History of renal cysts Musculoskeletal: Negative;  no myalgias, joint pain, or weakness Hematologic/Oncology: Negative; no easy bruising, bleeding Endocrine: Negative; no heat/cold intolerance; no diabetes Neuro: Negative; no changes in balance, headaches Skin: Negative; No rashes or skin lesions Psychiatric: Negative; No behavioral problems, depression Sleep: Negative; No snoring, daytime sleepiness, hypersomnolence, bruxism, restless legs, hypnogognic hallucinations, no cataplexy Other comprehensive 14 point system review is negative.   PE BP (!) 162/64   Pulse (!) 47   Ht _0  (1.778 m)   Wt 224 lb 3.2 oz (101.7 kg)   BMI  32.17 kg/m    Repeat blood pressure by me was 132/72, supine and 130/70 standing.  Wt Readings from Last 3 Encounters:  07/21/17 224 lb 3.2 oz (101.7 kg)  04/20/17 220 lb 3.2 oz (99.9 kg)  03/18/17 226 lb (102.5 kg)   General: Alert, oriented, no distress.  Skin: normal turgor, no rashes, warm and dry HEENT: Normocephalic, atraumatic. Pupils equal round and reactive to light; sclera anicteric; extraocular muscles intact;  Nose without nasal septal hypertrophy Mouth/Parynx benign; Mallinpatti scale 3 Neck: No JVD, no carotid bruits; normal carotid upstroke Lungs: clear to ausculatation and percussion; no wheezing or rales Chest wall: without tenderness to palpitation Heart: PMI not displaced, RRR, s1 s2 normal, 1/6 systolic murmur, no diastolic murmur, no rubs, gallops, thrills, or heaves Abdomen: No renal artery or abdominal bruits.  soft, nontender; no hepatosplenomehaly, BS+; abdominal aorta nontender and not dilated by palpation. Back: no CVA tenderness Pulses 2+ Musculoskeletal: full range of motion, normal strength, no joint deformities Extremities: no clubbing cyanosis or edema, Homan's sign negative  Neurologic: grossly nonfocal; Cranial nerves grossly wnl Psychologic: Normal mood and affect   ECG (independently read by me): Sinus bradycardia at 47 bpm.  First-degree AV block with a PR interval  of 222 ms.  Right bundle branch block with repolarization changes.  PAC.  October 2018 ECG (independently read by me): Sinus bradycardia at 47 bpm. Accelerated AV conduction with a PR interval at 88 ms.  Right bundle-branch block with repolarization changes.  However, question blocked PACs every other beat.  April 2016 ECG (independently read by me): Sinus bradycardia at 54 bpm with right bundle branch block.  PAC.  QTc interval 479 msec.  PR interval 196 ms.  September 2014 ECG: Sinus bradycardia with first-degree AV block. Heart rate 46 beats per minute. Right bundle branch block, unchanged.  LABS: BMP Latest Ref Rng & Units 04/20/2017 06/28/2015 06/27/2015  Glucose 65 - 99 mg/dL 96 110(H) 96  BUN 8 - 27 mg/dL 14 19 21(H)  Creatinine 0.76 - 1.27 mg/dL 1.04 1.15 1.41(H)  BUN/Creat Ratio 10 - 24 13 - -  Sodium 134 - 144 mmol/L 142 138 140  Potassium 3.5 - 5.2 mmol/L 4.6 3.6 4.3  Chloride 96 - 106 mmol/L 106 108 105  CO2 20 - 29 mmol/L _0 Calcium 8.6 - 10.2 mg/dL 9.1 8.2(L) 9.2   Hepatic Function Latest Ref Rng & Units 04/20/2017 06/28/2015 09/11/2011  Total Protein 6.0 - 8.5 g/dL 7.2 5.3(L) -  Albumin 3.6 - 4.8 g/dL 4.1 3.0(L) -  AST 0 - 40 IU/L _1 ALT 0 - 44 IU/L _2 Alk Phosphatase 39 - 117 IU/L 109 69 97  Total Bilirubin 0.0 - 1.2 mg/dL 1.0 1.1 0.4   CBC Latest Ref Rng & Units 04/20/2017 06/29/2015 06/28/2015  WBC 3.4 - 10.8 x10E3/uL 4.3 5.0 6.7  Hemoglobin 13.0 - 17.7 g/dL 12.7(L) 11.4(L) 11.4(L)  Hematocrit 37.5 - 51.0 % 38.1 34.6(L) 35.2(L)  Platelets 150 - 379 x10E3/uL 242 163 185   Lab Results  Component Value Date   MCV 66 (L) 04/20/2017   MCV 68.0 (L) 06/29/2015   MCV 68.6 (L) 06/28/2015   Lab Results  Component Value Date   TSH 1.120 04/20/2017   No results found for: HGBA1C   Lipid Panel     Component Value Date/Time   CHOL 137 04/20/2017 1102   TRIG 52 04/20/2017 1102   HDL 62 04/20/2017 1102   CHOLHDL  2.2 04/20/2017 1102   LDLCALC 65  04/20/2017 1102    RADIOLOGY: No results found.  IMPRESSION:  1. Essential hypertension   2. Diastolic dysfunction- grade 2 on echo 2016   3. RBBB   4. Paroxysmal atrial fibrillation (HCC)   5. Renal cysts, acquired, bilateral   6. Mild obesity   7. Bradycardia     ASSESSMENT AND PLAN: Joseph [name] is 72 year old African American male who has a history of hypertension and CT evidence for small lacunar infarctions most likely of hypertensive etiology. There also is a remote history of rheumatic fever age 48. He had developed transient symptoms of right arm weakness with numbness in August 2012 which led to his MRI of his head which showed scattered small white matter hyper intensities bilaterally with also involvement in the right putamen, bilateral thalamus the also he has some additional changes in the pons, right and left inferior cerebellum.  Prior echo Doppler study has shown restrictive physiology.  His last echo Doppler study in December 2016 showed an EF of 50-55%, LVH, and grade 2 diastolic dysfunction.  There was mild biatrial enlargement.  Acoustical window was poor quality.  He has had issues in the past with bradycardia leading to discontinuance of his metoprolol.  He has a prescription to take on an as-needed basis for PAF.  He is unaware of any recurrent atrial fibrillation episodes.  He continues to be on eliquis 5 mg twice a day.  His blood pressure today is controlled on amlodipine 10 mg, Cardura 4 mg at bedtime, irbesartan 300 mg, and minoxidil 5 mg daily..  He apparently felt he did not tolerate hydralazine.  I'm doubtful that this contributed to his fatigue.  Most likely the fatigue is the result of him being bradycardic.  He does not have significant edema on exam today, but is wearing support socks.  I reviewed his renal duplex study which does not demonstrate renal artery stenosis.  He does have greater than 70% narrowing at the celiac axis and SMA and denies any difficulty  with digestion or abdominal complaints.  He will continue to monitor his blood pressure since there may be some lability.  If his blood pressure continues to increase.  I have recommended resumption of hydralazine 12.5 twice.  He will be seeing Dr. [name] in May and additional laboratory will be obtained.  I reviewed his most recent lipid studies from October 2018, and LDL was excellent at 24 with a total cholesterol of 137.  His BMI is 32.17, and weight loss and increased exercise was recommended.  I will see him in 8-9 months for cardiology reevaluation.  Troy Sine, MD, [facility]  07/23/2017 8:06 AM

## 2017-07-21 NOTE — Patient Instructions (Signed)
Medication Instructions:  Your physician recommends that you continue on your current medications as directed. Please refer to the Current Medication list given to you today.  Follow-Up: Your physician wants you to follow-up in: 8-9 months with Dr. Dow Adolph will receive a reminder letter in the mail two months in advance. If you don't receive a letter, please call our office to schedule the follow-up appointment.   Any Other Special Instructions Will Be Listed Below (If Applicable).     If you need a refill on your cardiac medications before your next appointment, please call your pharmacy.

## 2017-07-23 ENCOUNTER — Encounter: Payer: Self-pay | Admitting: Cardiovascular Disease

## 2017-10-28 ENCOUNTER — Other Ambulatory Visit: Payer: Self-pay | Admitting: Cardiovascular Disease

## 2017-10-29 NOTE — Telephone Encounter (Signed)
REFILL 

## 2018-01-14 ENCOUNTER — Telehealth: Payer: Self-pay | Admitting: *Deleted

## 2018-01-14 NOTE — Telephone Encounter (Signed)
   [facility] Pre-operative Risk Assessment    Request for surgical clearance:  1. What type of surgery is being performed? Surgical excision of plantar fibroma -left foot  2. When is this surgery scheduled? TBD   3. What type of clearance is required (medical clearance vs. Pharmacy clearance to hold med vs. Both)?  both  4. Are there any medications that need to be held prior to surgery and how long?Eliquis   5. Practice name and name of physician performing surgery?  [facility] Dr. Doran Durand   6. What is your office phone number [phone]    7.   What is your office fax number [phone]  8.   Anesthesia type (None, local, MAC, general) ?    Laiken Sandy A Omarr Hann 01/14/2018, 11:53 AM  _________________________________________________________________   (provider comments below)

## 2018-01-14 NOTE — Telephone Encounter (Signed)
   Primary Cardiologist: Shelva Majestic, MD  Chart reviewed and patient contacted by phone as part of pre-operative protocol coverage. Given past medical history, my conversation with Mr Dominic today, and time since last visit, based on ACC/AHA guidelines, JONTRELL BUSHONG would be at acceptable risk for the planned procedure without further cardiovascular testing.   OK to hold Eliquis 24 hours pre op.   I will route this recommendation to the requesting party via Epic fax function and remove from pre-op pool.  Please call with questions.  Kerin Ransom, PA-C 01/14/2018, 4:21 PM

## 2018-01-14 NOTE — Telephone Encounter (Signed)
Patient with diagnosis of atrial fibrillation/prior CVA on Eliquis for anticoagulation.    Procedure: plantar fibroma Date of procedure: TBD  CHADS2-VASc score of  4 (, HTN, AGE, , stroke/tia x 2, )  CrCl 96.4 Platelet count 242  Per office protocol, patient can hold Eliquis for 1 day prior to procedure.    Patient should restart Eliquis evening of procedure or day after, at discretion of surgeon.

## 2018-01-14 NOTE — Telephone Encounter (Signed)
I called patient- cleared from cardiac standpoint, waiting on official pharmacy recommendations before sending clearance.  Kerin Ransom PA-C 01/14/2018 2:18 PM

## 2018-02-14 ENCOUNTER — Other Ambulatory Visit (HOSPITAL_COMMUNITY): Payer: Self-pay | Admitting: Orthopedic Surgery

## 2018-02-22 ENCOUNTER — Other Ambulatory Visit: Payer: Self-pay | Admitting: Physician Assistant

## 2018-05-05 ENCOUNTER — Encounter (HOSPITAL_BASED_OUTPATIENT_CLINIC_OR_DEPARTMENT_OTHER): Payer: Self-pay | Admitting: *Deleted

## 2018-05-05 ENCOUNTER — Other Ambulatory Visit: Payer: Self-pay

## 2018-05-12 ENCOUNTER — Ambulatory Visit (HOSPITAL_BASED_OUTPATIENT_CLINIC_OR_DEPARTMENT_OTHER): Payer: Medicare Other | Admitting: Certified Registered"

## 2018-05-12 ENCOUNTER — Encounter (HOSPITAL_BASED_OUTPATIENT_CLINIC_OR_DEPARTMENT_OTHER): Payer: Self-pay | Admitting: *Deleted

## 2018-05-12 ENCOUNTER — Other Ambulatory Visit: Payer: Self-pay

## 2018-05-12 ENCOUNTER — Encounter (HOSPITAL_BASED_OUTPATIENT_CLINIC_OR_DEPARTMENT_OTHER)
Admission: RE | Disposition: A | Discharge status: Home / Self Care | Payer: Self-pay | Source: Ambulatory Visit | Attending: Orthopedic Surgery

## 2018-05-12 ENCOUNTER — Ambulatory Visit (HOSPITAL_BASED_OUTPATIENT_CLINIC_OR_DEPARTMENT_OTHER)
Admission: RE | Admit: 2018-05-12 | Discharge: 2018-05-12 | Disposition: A | Discharge status: Home / Self Care | Payer: Medicare Other | Source: Ambulatory Visit | Attending: Orthopedic Surgery | Admitting: Orthopedic Surgery

## 2018-05-12 DIAGNOSIS — Z7901 Long term (current) use of anticoagulants: Secondary | ICD-10-CM | POA: Diagnosis not present

## 2018-05-12 DIAGNOSIS — Z79899 Other long term (current) drug therapy: Secondary | ICD-10-CM | POA: Insufficient documentation

## 2018-05-12 DIAGNOSIS — I4891 Unspecified atrial fibrillation: Secondary | ICD-10-CM | POA: Diagnosis not present

## 2018-05-12 DIAGNOSIS — M799 Soft tissue disorder, unspecified: Secondary | ICD-10-CM | POA: Diagnosis not present

## 2018-05-12 DIAGNOSIS — Z87891 Personal history of nicotine dependence: Secondary | ICD-10-CM | POA: Diagnosis not present

## 2018-05-12 DIAGNOSIS — I10 Essential (primary) hypertension: Secondary | ICD-10-CM | POA: Diagnosis not present

## 2018-05-12 DIAGNOSIS — R2242 Localized swelling, mass and lump, left lower limb: Secondary | ICD-10-CM

## 2018-05-12 HISTORY — DX: Cerebral infarction, unspecified: I63.9

## 2018-05-12 HISTORY — PX: EXCISION MASS LOWER EXTREMETIES: SHX6705

## 2018-05-12 SURGERY — EXCISION MASS LOWER EXTREMITIES
Anesthesia: General | Site: Foot | Laterality: Left

## 2018-05-12 MED ORDER — LACTATED RINGERS IV SOLN
INTRAVENOUS | Status: DC
  Administered 2018-05-12: 07:00:00 via INTRAVENOUS

## 2018-05-12 MED ORDER — CEFAZOLIN SODIUM-DEXTROSE 2-4 GM/100ML-% IV SOLN
INTRAVENOUS | Status: AC
  Filled 2018-05-12: qty 100

## 2018-05-12 MED ORDER — FENTANYL CITRATE (PF) 100 MCG/2ML IJ SOLN: 25.0000 ug | INTRAMUSCULAR | Status: DC | PRN

## 2018-05-12 MED ORDER — LIDOCAINE HCL (CARDIAC) PF 100 MG/5ML IV SOSY
PREFILLED_SYRINGE | INTRAVENOUS | Status: DC | PRN
  Administered 2018-05-12: 80 mg via INTRAVENOUS

## 2018-05-12 MED ORDER — TRAMADOL HCL 50 MG PO TABS: 50.0000 mg | ORAL_TABLET | Freq: Four times a day (QID) | ORAL | 0 refills | Status: AC | PRN

## 2018-05-12 MED ORDER — CEFAZOLIN SODIUM-DEXTROSE 2-4 GM/100ML-% IV SOLN
2.0000 g | INTRAVENOUS | Status: AC
  Administered 2018-05-12: 2 g via INTRAVENOUS

## 2018-05-12 MED ORDER — BUPIVACAINE HCL (PF) 0.5 % IJ SOLN
INTRAMUSCULAR | Status: AC
  Filled 2018-05-12: qty 60

## 2018-05-12 MED ORDER — MIDAZOLAM HCL 2 MG/2ML IJ SOLN
INTRAMUSCULAR | Status: AC
  Filled 2018-05-12: qty 2

## 2018-05-12 MED ORDER — DEXAMETHASONE SODIUM PHOSPHATE 4 MG/ML IJ SOLN
INTRAMUSCULAR | Status: DC | PRN
  Administered 2018-05-12: 4 mg via INTRAVENOUS

## 2018-05-12 MED ORDER — FENTANYL CITRATE (PF) 100 MCG/2ML IJ SOLN
50.0000 ug | INTRAMUSCULAR | Status: DC | PRN
  Administered 2018-05-12: 50 ug via INTRAVENOUS

## 2018-05-12 MED ORDER — MIDAZOLAM HCL 2 MG/2ML IJ SOLN: 1.0000 mg | INTRAMUSCULAR | Status: DC | PRN

## 2018-05-12 MED ORDER — SODIUM CHLORIDE 0.9 % IV SOLN: INTRAVENOUS | Status: DC

## 2018-05-12 MED ORDER — SCOPOLAMINE 1 MG/3DAYS TD PT72: 1.0000 | MEDICATED_PATCH | Freq: Once | TRANSDERMAL | Status: DC | PRN

## 2018-05-12 MED ORDER — MEPERIDINE HCL 25 MG/ML IJ SOLN: 6.2500 mg | INTRAMUSCULAR | Status: DC | PRN

## 2018-05-12 MED ORDER — FENTANYL CITRATE (PF) 100 MCG/2ML IJ SOLN
INTRAMUSCULAR | Status: AC
  Filled 2018-05-12: qty 2

## 2018-05-12 MED ORDER — PROPOFOL 10 MG/ML IV BOLUS
INTRAVENOUS | Status: DC | PRN
  Administered 2018-05-12: 50 mg via INTRAVENOUS
  Administered 2018-05-12: 150 mg via INTRAVENOUS

## 2018-05-12 MED ORDER — BUPIVACAINE-EPINEPHRINE (PF) 0.25% -1:200000 IJ SOLN
INTRAMUSCULAR | Status: AC
  Filled 2018-05-12: qty 60

## 2018-05-12 MED ORDER — ONDANSETRON HCL 4 MG/2ML IJ SOLN
INTRAMUSCULAR | Status: DC | PRN
  Administered 2018-05-12: 4 mg via INTRAVENOUS

## 2018-05-12 MED ORDER — BUPIVACAINE HCL (PF) 0.25 % IJ SOLN
INTRAMUSCULAR | Status: AC
  Filled 2018-05-12: qty 60

## 2018-05-12 MED ORDER — BUPIVACAINE-EPINEPHRINE (PF) 0.25% -1:200000 IJ SOLN
INTRAMUSCULAR | Status: DC | PRN
  Administered 2018-05-12: 10 mL

## 2018-05-12 MED ORDER — METOCLOPRAMIDE HCL 5 MG/ML IJ SOLN: 10.0000 mg | Freq: Once | INTRAMUSCULAR | Status: DC | PRN

## 2018-05-12 MED ORDER — CHLORHEXIDINE GLUCONATE 4 % EX LIQD: 60.0000 mL | Freq: Once | CUTANEOUS | Status: DC

## 2018-05-12 SURGICAL SUPPLY — 68 items
BANDAGE ACE 4X5 VEL STRL LF (GAUZE/BANDAGES/DRESSINGS) IMPLANT
BANDAGE ESMARK 6X9 LF (GAUZE/BANDAGES/DRESSINGS) IMPLANT
BLADE SURG 15 STRL LF DISP TIS (BLADE) ×1 IMPLANT
BLADE SURG 15 STRL SS (BLADE) ×2
BNDG COHESIVE 4X5 TAN STRL (GAUZE/BANDAGES/DRESSINGS) ×3 IMPLANT
BNDG COHESIVE 6X5 TAN STRL LF (GAUZE/BANDAGES/DRESSINGS) IMPLANT
BNDG CONFORM 3 STRL LF (GAUZE/BANDAGES/DRESSINGS) IMPLANT
BNDG ESMARK 4X9 LF (GAUZE/BANDAGES/DRESSINGS) IMPLANT
BNDG ESMARK 6X9 LF (GAUZE/BANDAGES/DRESSINGS)
CHLORAPREP W/TINT 26ML (MISCELLANEOUS) ×3 IMPLANT
CLOSURE WOUND 1/2 X4 (GAUZE/BANDAGES/DRESSINGS)
CORD BIPOLAR FORCEPS 12FT (ELECTRODE) ×3 IMPLANT
COVER BACK TABLE 60X90IN (DRAPES) ×3 IMPLANT
COVER WAND RF STERILE (DRAPES) IMPLANT
CUFF TOURNIQUET SINGLE 24IN (TOURNIQUET CUFF) IMPLANT
CUFF TOURNIQUET SINGLE 34IN LL (TOURNIQUET CUFF) IMPLANT
DRAPE EXTREMITY T 121X128X90 (DRAPE) ×3 IMPLANT
DRAPE OEC MINIVIEW 54X84 (DRAPES) IMPLANT
DRAPE SURG 17X23 STRL (DRAPES) IMPLANT
DRAPE U-SHAPE 47X51 STRL (DRAPES) ×3 IMPLANT
DRSG MEPITEL 4X7.2 (GAUZE/BANDAGES/DRESSINGS) ×3 IMPLANT
DRSG PAD ABDOMINAL 8X10 ST (GAUZE/BANDAGES/DRESSINGS) ×3 IMPLANT
ELECT REM PT RETURN 9FT ADLT (ELECTROSURGICAL) ×3
ELECTRODE REM PT RTRN 9FT ADLT (ELECTROSURGICAL) ×1 IMPLANT
GAUZE SPONGE 4X4 12PLY STRL (GAUZE/BANDAGES/DRESSINGS) ×3 IMPLANT
GLOVE BIO SURGEON STRL SZ8 (GLOVE) ×3 IMPLANT
GLOVE BIOGEL M STRL SZ7.5 (GLOVE) ×3 IMPLANT
GLOVE BIOGEL PI IND STRL 7.0 (GLOVE) ×1 IMPLANT
GLOVE BIOGEL PI IND STRL 8 (GLOVE) ×3 IMPLANT
GLOVE BIOGEL PI INDICATOR 7.0 (GLOVE) ×2
GLOVE BIOGEL PI INDICATOR 8 (GLOVE) ×6
GLOVE ECLIPSE 8.0 STRL XLNG CF (GLOVE) ×3 IMPLANT
GOWN STRL REUS W/ TWL LRG LVL3 (GOWN DISPOSABLE) ×1 IMPLANT
GOWN STRL REUS W/ TWL XL LVL3 (GOWN DISPOSABLE) ×2 IMPLANT
GOWN STRL REUS W/TWL LRG LVL3 (GOWN DISPOSABLE) ×2
GOWN STRL REUS W/TWL XL LVL3 (GOWN DISPOSABLE) ×4
NEEDLE HYPO 22GX1.5 SAFETY (NEEDLE) IMPLANT
NEEDLE HYPO 25X1 1.5 SAFETY (NEEDLE) ×3 IMPLANT
NS IRRIG 1000ML POUR BTL (IV SOLUTION) ×3 IMPLANT
PACK BASIN DAY SURGERY FS (CUSTOM PROCEDURE TRAY) ×3 IMPLANT
PAD CAST 4YDX4 CTTN HI CHSV (CAST SUPPLIES) ×1 IMPLANT
PADDING CAST ABS 4INX4YD NS (CAST SUPPLIES)
PADDING CAST ABS COTTON 4X4 ST (CAST SUPPLIES) IMPLANT
PADDING CAST COTTON 4X4 STRL (CAST SUPPLIES) ×2
PADDING CAST COTTON 6X4 STRL (CAST SUPPLIES) IMPLANT
PENCIL BUTTON HOLSTER BLD 10FT (ELECTRODE) ×3 IMPLANT
SANITIZER HAND PURELL 535ML FO (MISCELLANEOUS) ×3 IMPLANT
SHEET MEDIUM DRAPE 40X70 STRL (DRAPES) ×3 IMPLANT
SLEEVE SCD COMPRESS KNEE MED (MISCELLANEOUS) ×3 IMPLANT
SPONGE LAP 18X18 RF (DISPOSABLE) ×3 IMPLANT
STOCKINETTE 6  STRL (DRAPES) ×2
STOCKINETTE 6 STRL (DRAPES) ×1 IMPLANT
STRIP CLOSURE SKIN 1/2X4 (GAUZE/BANDAGES/DRESSINGS) IMPLANT
SUCTION FRAZIER HANDLE 10FR (MISCELLANEOUS)
SUCTION TUBE FRAZIER 10FR DISP (MISCELLANEOUS) IMPLANT
SUT ETHILON 3 0 PS 1 (SUTURE) ×3 IMPLANT
SUT MNCRL AB 3-0 PS2 18 (SUTURE) IMPLANT
SUT VIC AB 0 SH 27 (SUTURE) IMPLANT
SUT VIC AB 2-0 SH 27 (SUTURE)
SUT VIC AB 2-0 SH 27XBRD (SUTURE) IMPLANT
SYR BULB 3OZ (MISCELLANEOUS) ×3 IMPLANT
SYR CONTROL 10ML LL (SYRINGE) ×3 IMPLANT
SYSTEM CHEST DRAIN TLS 7FR (DRAIN) ×3 IMPLANT
TOWEL GREEN STERILE FF (TOWEL DISPOSABLE) ×3 IMPLANT
TUBE CONNECTING 20'X1/4 (TUBING)
TUBE CONNECTING 20X1/4 (TUBING) IMPLANT
UNDERPAD 30X30 (UNDERPADS AND DIAPERS) ×3 IMPLANT
YANKAUER SUCT BULB TIP NO VENT (SUCTIONS) ×3 IMPLANT

## 2018-05-12 NOTE — Anesthesia Postprocedure Evaluation (Signed)
Anesthesia Post Note  Patient: DYLLEN MENNING  Procedure(s) Performed: Left foot plantar fibroma excision (Left Foot)     Patient location during evaluation: PACU Anesthesia Type: General Level of consciousness: awake and alert Pain management: pain level controlled Vital Signs Assessment: post-procedure vital signs reviewed and stable Respiratory status: spontaneous breathing, nonlabored ventilation, respiratory function stable and patient connected to nasal cannula oxygen Cardiovascular status: blood pressure returned to baseline and stable Postop Assessment: no apparent nausea or vomiting Anesthetic complications: no    Last Vitals:  Vitals:   05/12/18 0900 05/12/18 1000  BP: 132/60 (!) 153/62  Pulse: (!) 50 (!) 59  Resp: 13 16  Temp:  36.4 C  SpO2: 98% 97%    Last Pain:  Vitals:   05/12/18 1000  TempSrc:   PainSc: 0-No pain                 Montez Hageman

## 2018-05-12 NOTE — Anesthesia Preprocedure Evaluation (Signed)
Anesthesia Evaluation  Patient identified by MRN, date of birth, ID band Patient awake    Reviewed: Allergy & Precautions, NPO status , Patient's Chart, lab work & pertinent test results  Airway Mallampati: II  TM Distance: >3 FB Neck ROM: Full    Dental no notable dental hx.    Pulmonary former smoker,    Pulmonary exam normal breath sounds clear to auscultation       Cardiovascular hypertension, Pt. on medications Normal cardiovascular exam+ dysrhythmias Atrial Fibrillation  Rhythm:Regular Rate:Normal     Neuro/Psych CVA, No Residual Symptoms negative psych ROS   GI/Hepatic negative GI ROS, Neg liver ROS,   Endo/Other  negative endocrine ROS  Renal/GU negative Renal ROS  negative genitourinary   Musculoskeletal negative musculoskeletal ROS (+)   Abdominal   Peds negative pediatric ROS (+)  Hematology negative hematology ROS (+)   Anesthesia Other Findings   Reproductive/Obstetrics negative OB ROS                             Anesthesia Physical Anesthesia Plan  ASA: II  Anesthesia Plan: General   Post-op Pain Management:    Induction: Intravenous  PONV Risk Score and Plan: 2 and Ondansetron  Airway Management Planned: LMA  Additional Equipment:   Intra-op Plan:   Post-operative Plan: Extubation in OR  Informed Consent: I have reviewed the patients History and Physical, chart, labs and discussed the procedure including the risks, benefits and alternatives for the proposed anesthesia with the patient or authorized representative who has indicated his/her understanding and acceptance.   Dental advisory given  Plan Discussed with: CRNA  Anesthesia Plan Comments:         Anesthesia Quick Evaluation

## 2018-05-12 NOTE — Anesthesia Procedure Notes (Signed)
Procedure Name: LMA Insertion Date/Time: 05/12/2018 7:19 AM Performed by: Willa Frater, CRNA Pre-anesthesia Checklist: Patient identified, Emergency Drugs available, Suction available and Patient being monitored Patient Re-evaluated:Patient Re-evaluated prior to induction Oxygen Delivery Method: Circle system utilized Preoxygenation: Pre-oxygenation with 100% oxygen Induction Type: IV induction Ventilation: Mask ventilation without difficulty LMA: LMA with gastric port inserted LMA Size: 4.0 Number of attempts: 1 Airway Equipment and Method: Bite block Placement Confirmation: positive ETCO2 Tube secured with: Tape Dental Injury: Teeth and Oropharynx as per pre-operative assessment

## 2018-05-12 NOTE — Discharge Instructions (Addendum)
Joseph Simmer, MD [place]  Please read the following information regarding your care after surgery.  Medications  You only need a prescription for the narcotic pain medicine (ex. oxycodone, Percocet, Norco).  All of the other medicines listed below are available over the counter. X Aleve 2 pills twice a day for the first 3 days after surgery. X acetominophen (Tylenol) 650 mg every 4-6 hours as you need for minor to moderate pain X tramadol as prescribed for severe pain  X To help prevent blood clots resume Eliquis  Weight Bearing X Bear weight only on your operated foot in the post-op shoe.   Cast / Splint / Dressing  X Remove your dressing 3 days after surgery and cover the incisions with dry dressings.    After your dressing, cast or splint is removed; you may shower, but do not soak or scrub the wound.  Allow the water to run over it, and then gently pat it dry.  Swelling It is normal for you to have swelling where you had surgery.  To reduce swelling and pain, keep your toes above your nose for at least 3 days after surgery.  It may be necessary to keep your foot or leg elevated for several weeks.  If it hurts, it should be elevated.  Follow Up Call my office at [phone] when you are discharged from the hospital or surgery center to schedule an appointment to be seen two weeks after surgery.  Call my office at [phone] if you develop a fever >101.5 F, nausea, vomiting, bleeding from the surgical site or severe pain.      Post Anesthesia Home Care Instructions  Activity: Get plenty of rest for the remainder of the day. A responsible individual must stay with you for 24 hours following the procedure.  For the next 24 hours, DO NOT: -Drive a car -Paediatric nurse -Drink alcoholic beverages -Take any medication unless instructed by your physician -Make any legal decisions or sign important papers.  Meals: Start with liquid foods such as gelatin or  soup. Progress to regular foods as tolerated. Avoid greasy, spicy, heavy foods. If nausea and/or vomiting occur, drink only clear liquids until the nausea and/or vomiting subsides. Call your physician if vomiting continues.  Special Instructions/Symptoms: Your throat may feel dry or sore from the anesthesia or the breathing tube placed in your throat during surgery. If this causes discomfort, gargle with warm salt water. The discomfort should disappear within 24 hours.  If you had a scopolamine patch placed behind your ear for the management of post- operative nausea and/or vomiting:  1. The medication in the patch is effective for 72 hours, after which it should be removed.  Wrap patch in a tissue and discard in the trash. Wash hands thoroughly with soap and water. 2. You may remove the patch earlier than 72 hours if you experience unpleasant side effects which may include dry mouth, dizziness or visual disturbances. 3. Avoid touching the patch. Wash your hands with soap and water after contact with the patch.   Call your surgeon if you experience:   1.  Fever over 101.0. 2.  Inability to urinate. 3.  Nausea and/or vomiting. 4.  Extreme swelling or bruising at the surgical site. 5.  Continued bleeding from the incision. 6.  Increased pain, redness or drainage from the incision. 7.  Problems related to your pain medication. 8.  Any problems and/or concerns

## 2018-05-12 NOTE — H&P (Signed)
Joseph [name] is an 72 y.o. male.   Chief Complaint: left foot pain HPI: The patient is a 72 year old male with a history of a painful left plantar foot mass.  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  It is limiting his shoewear and and activity.  He has failed nonoperative treatment to date and presents today for surgical excision of this mass.  Past Medical History:  Diagnosis Date  . 1st degree AV block   . 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 Diastolic dysfunction 6/28   grade 1  . Hypertension   . New onset atrial fibrillation ([place]) 06/2015  . RBBB   . Sinus bradycardia   . Stroke [facility])    no defecits, pt denies having a stroke, says [name] diagnosed this    Past Surgical History:  Procedure Laterality Date  . COLONOSCOPY    . COLONOSCOPY  12/30/2011   Procedure: COLONOSCOPY;  Surgeon: [name], MD;  [facility];  Service: Endoscopy;  Laterality: N/A;  1:45PM    Family History  Problem Relation Age of Onset  . Cancer Brother   . CVA Mother   . Hypertension Father   . Colon cancer Neg Hx    Social History:  reports that he has quit smoking. He smoked 0.00 packs per day for 4.00 years. He has never used smokeless tobacco. He reports that he drinks alcohol. He reports that he does not use drugs.  Allergies: No Known Allergies  Medications Prior to Admission  Medication Sig Dispense Refill  . amLODipine (NORVASC) 10 MG tablet TAKE 1 TABLET(10 MG) BY MOUTH DAILY 90 tablet 1  . apixaban (ELIQUIS) 5 MG TABS tablet Take 1 tablet (5 mg total) by mouth 2 (two) times daily. 84 tablet 0  . doxazosin (CARDURA) 4 MG tablet Take 8 mg by mouth at bedtime.     . irbesartan (AVAPRO) 300 MG tablet TAKE 1 TABLET BY MOUTH DAILY. START AFTER LAST DOSE OF LOSARTAN 90 tablet 3  . minoxidil (LONITEN) 2.5 MG tablet Take 5 mg by mouth daily.     . saw palmetto 160 MG capsule Take 160 mg by mouth 2 (two) times daily.      No results found for  this or any previous visit (from the past 48 hour(s)). No results found.  ROS no recent fever, chills, nausea, vomiting or changes in his appetite  Blood pressure (!) 144/73, pulse (!) 48, temperature 98.1 F (36.7 C), temperature source Oral, resp. rate 18, height 5\' 10"  (1.778 m), weight 97.4 kg, SpO2 100 %. Physical Exam  Well-nourished well-developed man in no apparent distress.  Alert and oriented x4.  Mood and affect are normal.  Extraocular motions are intact.  Respirations are unlabored.  Gait is normal.  The left foot has a tender mass at the medial plantar fascia at the distal arch.  Skin is otherwise healthy and intact.  No lymphadenopathy.  Sensibility to light touch is intact at the saphenous nerve distribution.  5 out of 5 strength in plantar flexion and dorsiflexion of the ankle and toes.  Assessment/Plan Painful left plantar foot mass -to the operating room today for surgical excision.  The risks and benefits of the alternative treatment options have been discussed in detail.  The patient wishes to proceed with surgery and specifically understands risks of bleeding, infection, nerve damage, blood clots, need for additional surgery, amputation and death.   [name], MD 05/28/2018, 7:21  AM

## 2018-05-12 NOTE — Anesthesia Procedure Notes (Signed)
Procedure Name: LMA Insertion Date/Time: 05/12/2018 7:30 AM Performed by: Willa Frater, CRNA Pre-anesthesia Checklist: Patient identified, Emergency Drugs available, Suction available and Patient being monitored Patient Re-evaluated:Patient Re-evaluated prior to induction Oxygen Delivery Method: Circle system utilized Preoxygenation: Pre-oxygenation with 100% oxygen Induction Type: IV induction Ventilation: Mask ventilation without difficulty LMA: LMA inserted LMA Size: 5.0 Number of attempts: 1 Airway Equipment and Method: Bite block Placement Confirmation: positive ETCO2 Tube secured with: Tape Dental Injury: Teeth and Oropharynx as per pre-operative assessment

## 2018-05-12 NOTE — Transfer of Care (Signed)
Immediate Anesthesia Transfer of Care Note  Patient: Joseph [name]  Procedure(s) Performed: Left foot plantar fibroma excision (Left Foot)  Patient Location: PACU  Anesthesia Type:General  Level of Consciousness: awake and drowsy  Airway & Oxygen Therapy: Patient Spontanous Breathing and Patient connected to face mask oxygen  Post-op Assessment: Report given to RN and Post -op Vital signs reviewed and stable  Post vital signs: Reviewed and stable  Last Vitals:  Vitals Value Taken Time  BP    Temp    Pulse 108 05/12/2018  8:14 AM  Resp 12 05/12/2018  8:14 AM  SpO2 98 % 05/12/2018  8:14 AM  Vitals shown include unvalidated device data.  Last Pain:  Vitals:   05/12/18 0653  TempSrc: Oral  PainSc: 0-No pain         Complications: No apparent anesthesia complications

## 2018-05-12 NOTE — Op Note (Signed)
05/12/2018  8:13 AM  PATIENT:  Joseph [name]  72 y.o. male  PRE-OPERATIVE DIAGNOSIS: Left plantar foot mass  POST-OPERATIVE DIAGNOSIS: Same   Procedure(s): Deep excision of left plantar foot mass measuring 2 cm x 2 cm x 1 cm  SURGEON:  [name], MD  ASSISTANT: Mechele Claude, PA-C  ANESTHESIA:   General  EBL:  minimal   TOURNIQUET:   Total Tourniquet Time Documented: Calf (Left) - 15 minutes Total: Calf (Left) - 15 minutes  COMPLICATIONS:  None apparent  DISPOSITION:  Extubated, awake and stable to recovery.  INDICATION FOR PROCEDURE: The patient is a 72 year old male with past medical history significant for atrial fibrillation.  He has a painful mass on the plantar aspect of his left foot that has been bothersome for many months.  He has failed nonoperative treatment to date and presents today for surgical excision of this painful and limiting mass.  The risks and benefits of the alternative treatment options have been discussed in detail.  The patient wishes to proceed with surgery and specifically understands risks of bleeding, infection, nerve damage, blood clots, need for additional surgery, amputation and death.  PROCEDURE IN DETAIL:  After pre operative consent was obtained, and the correct operative site was identified, the patient was brought to the operating room and placed supine on the OR table.  Anesthesia was administered.  Pre-operative antibiotics were administered.  A surgical timeout was taken.  The left lower extremity was prepped and draped in standard sterile fashion.  The foot was exsanguinated and Esmarch tourniquet was wrapped around the ankle.  An incision was made medial to the mass at the distal portion of the arch along the glabrous border.  Dissection was carried down through the subcutaneous tissues to the medial border of the plantar fascia.  The mass was noted to be within the plantar fascia.  Dissection was carried deep and superficial to the  mass.  There is no evidence of adherence or invasion to any dorsal or plantar structures.  Fibers of the plantar fascia were then divided distal and proximal to the mass and it was dissected free from its lateral border.  It was passed off the field as a specimen to pathology and measured approximately 2 cm x 2 cm x 1 cm.  The wound was then irrigated copiously.  Hemostasis was achieved after releasing the tourniquet.  A TLS drain was placed in the wound.  The incision was closed with horizontal mattress sutures of 3-0 nylon.  Quarter percent Marcaine with epinephrine was infiltrated into the surgical site for postoperative pain control.  Sterile dressings were applied followed by compression wrap.  The patient was awakened from anesthesia and transported to the recovery room in stable condition.  FOLLOW UP PLAN: The patient will be weightbearing as tolerated on his left foot in a flat postop shoe.  He may remove the dressing in 3 days and use Band-Aids over it.  The drain will be removed before he leaves the surgery center.  Follow-up in 2 weeks for pathology results and suture removal.   Mechele Claude PA-C was present and scrubbed for the duration of the operative case. His assistance was essential in positioning the patient, prepping and draping, gaining and maintaining exposure, performing the operation, closing and dressing the wounds and applying the splint.

## 2018-05-13 ENCOUNTER — Encounter (HOSPITAL_BASED_OUTPATIENT_CLINIC_OR_DEPARTMENT_OTHER): Payer: Self-pay | Admitting: Orthopedic Surgery

## 2018-08-09 ENCOUNTER — Ambulatory Visit (INDEPENDENT_AMBULATORY_CARE_PROVIDER_SITE_OTHER): Payer: Medicare Other | Admitting: Cardiovascular Disease

## 2018-08-09 ENCOUNTER — Encounter: Payer: Self-pay | Admitting: Cardiovascular Disease

## 2018-08-09 VITALS — Ht 70.0 in | Wt 222.0 lb

## 2018-08-09 DIAGNOSIS — I5189 Other ill-defined heart diseases: Secondary | ICD-10-CM

## 2018-08-09 DIAGNOSIS — I10 Essential (primary) hypertension: Secondary | ICD-10-CM | POA: Diagnosis not present

## 2018-08-09 DIAGNOSIS — Z13220 Encounter for screening for lipoid disorders: Secondary | ICD-10-CM | POA: Diagnosis not present

## 2018-08-09 DIAGNOSIS — I519 Heart disease, unspecified: Secondary | ICD-10-CM | POA: Diagnosis not present

## 2018-08-09 DIAGNOSIS — I480 Paroxysmal atrial fibrillation: Secondary | ICD-10-CM

## 2018-08-09 DIAGNOSIS — N281 Cyst of kidney, acquired: Secondary | ICD-10-CM

## 2018-08-09 DIAGNOSIS — E669 Obesity, unspecified: Secondary | ICD-10-CM

## 2018-08-09 MED ORDER — SPIRONOLACTONE 25 MG PO TABS: 12.5000 mg | ORAL_TABLET | Freq: Every day | ORAL | 1 refills | Status: DC

## 2018-08-09 NOTE — Patient Instructions (Addendum)
Medication Instructions:  Start Spironolactone 12.5 daily If you need a refill on your cardiac medications before your next appointment, please call your pharmacy.   Lab work: Fasting labs If you have labs (blood work) drawn today and your tests are completely normal, you will receive your results only by: Marland Kitchen MyChart Message (if you have MyChart) OR . A paper copy in the mail If you have any lab test that is abnormal or we need to change your treatment, we will call you to review the results.  Testing/Procedures: Echocardiogram - Your physician has requested that you have an echocardiogram. Echocardiography is a painless test that uses sound waves to create images of your heart. It provides your doctor with information about the size and shape of your heart and how well your heart's chambers and valves are working. This procedure takes approximately one hour. There are no restrictions for this procedure. This will be performed at our [facility] location - [address].   Follow-Up: At [facility], you and your health needs are our priority.  As part of our continuing mission to provide you with exceptional heart care, we have created designated Provider Care Teams.  These Care Teams include your primary Cardiologist (physician) and Advanced Practice Providers (APPs -  Physician Assistants and Nurse Practitioners) who all work together to provide you with the care you need, when you need it. You will need a follow up appointment in 3 months.  You may see Shelva Majestic, MD or one of the following Advanced Practice Providers on your designated Care Team: [place], [place] . Fabian Sharp, PA-C

## 2018-08-09 NOTE — Progress Notes (Signed)
Patient ID: Joseph [name], male   DOB: [birth year], 73 y.o.   MRN: [id number]   Primary M.D.: Dr. Newt Minion  HPI: Joseph [name] is a 73 y.o. male who presents to the office for a 12 month cardiology evaluation.  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 He has a long-standing history of hypertension. A CT scan in 2013 suggested small lacunar infarctions most likely due to hypertension. I initially saw in April 2013 after this CT imaging study was done. He has documented normal systolic function with mild concentric LVH with diastolic dysfunction. An echo Doppler study in April 2013 revealed his mitral valve E/A ratio is 3.8 suggestive of restrictive physiology. He has documented renal cysts involving the left kidney showing a large inferior pole cortical cyst is measured 4.5 x 4.7 x 4.4 cm. In addition there was a second area at the mid pole laterally which appeared multi-septated and measured 2.7 x 2.7 x 1.9. I  recommended urologic evaluation which he did at [facility] and he was told that these were benign.  Mr. Joseph [name] has a history of moderate obesity.  In the past.  He had weight as high as 250 pounds.  Over the past several years.  His weight has been in the 220s.  When I last saw him, he was walking 3 miles at a time 3 days per week and denied any exertional chest pain or palpitations.   Over the past several years, he has been seen by [name] and also saw [name], [facility] for blood pressure management.  He last saw Joseph [name] on 03/18/2017, who switched losartan to irbesartan.  His blood pressure medications now include amlodipine 10 mg, irbesartan 300 mg, doxazosin 8 mg,minoxidil 5 mg, and Toprol, which she has been taking 25 mg as needed, but has not been taking this regularly.  He denies any episodes of chest pressure.  His last echo Doppler study was in December 2016 which showed mild LVH, EF 50-55% with grade 2 diastolic dysfunction.    When I last saw him in October 2018 he complained of fatigue and had noted a slow pulse.  He was bradycardic with heart rates in the 40s and I discontinued Toprol.  He was hypertensive and I recommended initiation of hydralazine 25 mg twice a day.  I scheduled him for renal Doppler studies to make certain there was no renal vascular etiology to his hypertension on multiple medical drug regimen.  Renal Doppler study did not demonstrate any evidence for renal artery stenosis.  He again was noted to have previously documented renal cysts with the right upper pole cyst at 1.5 x 1.7 cm, and a left exophytic lower pole cyst at 4.5 x 5.4 cm and left upper pole striated cyst at 3.6 x 2.5 cm.  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  He denies any abdominal complaints.  He had stopped taking hydralazine when he called the office in December 2018 due to feeling that he was having lower extremity edema.  He also felt that the hydralazine was contributing to his fatigue.  His edema has improved with diabetic socks.    I last saw him in January 2019 at which time he was bradycardic with a heart rate at 47 bpm.  He was unaware of any recurrent atrial fibrillation episodes and continue to be on Eliquis 5 mg twice a day.  His blood pressure was controlled on amlodipine 10 mg, Cardura 4 mg at bedtime, Erbe Sartain 300 mg, in addition to minoxidil 5 mg daily.  Over the past year, he has continued to be followed by Dr. [name].  He denies any recurrent arrhythmia.  He denies chest pressure or palpitations.  He has continued to be successful with his previous weight loss.  He presents for reevaluation.  Past Medical History:  Diagnosis Date  . 1st degree AV block   . 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 Diastolic dysfunction 7/65   grade 1  . Hypertension   . New onset atrial fibrillation ([place]) 06/2015  . RBBB   . Sinus bradycardia   . Stroke [facility])    no  defecits, pt denies having a stroke, says [name] diagnosed this    Past Surgical History:  Procedure Laterality Date  . COLONOSCOPY    . COLONOSCOPY  12/30/2011   Procedure: COLONOSCOPY;  Surgeon: [name], MD;  Location[facility];  Service: Endoscopy;  Laterality: N/A;  1:45PM  . EXCISION MASS LOWER EXTREMETIES Left 05/12/2018   Procedure: Left foot plantar fibroma excision;  Surgeon: [name], MD;  Location: [place];  Service: Orthopedics;  Laterality: Left;    No Known Allergies  Current Outpatient Medications  Medication Sig Dispense Refill  . amLODipine (NORVASC) 10 MG tablet TAKE 1 TABLET(10 MG) BY MOUTH DAILY 90 tablet 1  . apixaban (ELIQUIS) 5 MG TABS tablet Take 1 tablet (5 mg total) by mouth 2 (two) times daily. 84 tablet 0  . doxazosin (CARDURA) 4 MG tablet Take 8 mg by mouth at bedtime.     . irbesartan (AVAPRO) 300 MG tablet TAKE 1 TABLET BY MOUTH DAILY. START AFTER LAST DOSE OF LOSARTAN 90 tablet 3  . minoxidil (LONITEN) 2.5 MG tablet Take 5 mg by mouth daily.     . saw palmetto 160 MG capsule Take 160 mg by mouth 2 (two) times daily.    Marland Kitchen UNABLE TO FIND Take 2 tablets by mouth daily. Med Name: Super Beta Prostate    . spironolactone (ALDACTONE) 25 MG tablet Take 0.5 tablets (12.5 mg total) by mouth daily. 60 tablet 1   No current facility-administered medications for this visit.     Social History   Socioeconomic History  . Marital status: Married    Spouse name: Not on file  . Number of children: Not on file  . Years of education: Not on file  . Highest education level: Not on file  Occupational History  . Not on file  Social Needs  . Financial resource strain: Not on file  . Food insecurity:    Worry: Not on file    Inability: Not on file  . Transportation needs:    Medical: Not on file    Non-medical: Not on file  Tobacco Use  . Smoking status: Former Smoker    Packs/day: 0.00    Years: 4.00    Pack years: 0.00  .  Smokeless tobacco: Never Used  . Tobacco comment: quit about 40 yrs ago  Substance and Sexual Activity  . Alcohol use: Yes    Alcohol/week: 0.0 standard drinks    Comment: 6 pack of beer in a week  . Drug use: No  . Sexual activity: Not on file  Lifestyle  . Physical activity:    Days per week: Not on file    Minutes per session: Not on file  . Stress: Not  on file  Relationships  . Social connections:    Talks on phone: Not on file    Gets together: Not on file    Attends religious service: Not on file    Active member of club or organization: Not on file    Attends meetings of clubs or organizations: Not on file    Relationship status: Not on file  . Intimate partner violence:    Fear of current or ex partner: Not on file    Emotionally abused: Not on file    Physically abused: Not on file    Forced sexual activity: Not on file  Other Topics Concern  . Not on file  Social History Narrative  . Not on file    Family History  Problem Relation Age of Onset  . Cancer Brother   . CVA Mother   . Hypertension Father   . Colon cancer Neg Hx     ROS General: Negative; No fevers, chills, or night sweats;  HEENT: Negative; No changes in vision or hearing, sinus congestion, difficulty swallowing Pulmonary: Negative; No cough, wheezing, shortness of breath, hemoptysis Cardiovascular: Negative; No chest pain, presyncope, syncope, palpitations GI: Negative; No nausea, vomiting, diarrhea, or abdominal pain GU: History of renal cysts Musculoskeletal: Negative; no myalgias, joint pain, or weakness Hematologic/Oncology: Negative; no easy bruising, bleeding Endocrine: Negative; no heat/cold intolerance; no diabetes Neuro: Negative; no changes in balance, headaches Skin: Negative; No rashes or skin lesions Psychiatric: Negative; No behavioral problems, depression Sleep: Negative; No snoring, daytime sleepiness, hypersomnolence, bruxism, restless legs, hypnogognic hallucinations, no  cataplexy Other comprehensive 14 point system review is negative.   PE Ht _0  (1.778 m)   Wt 222 lb (100.7 kg)   BMI 31.85 kg/m    Repeat blood pressure by me was 158/80  Wt Readings from Last 3 Encounters:  08/09/18 222 lb (100.7 kg)  05/12/18 214 lb 11.7 oz (97.4 kg)  07/21/17 224 lb 3.2 oz (101.7 kg)    General: Alert, oriented, no distress.  Skin: normal turgor, no rashes, warm and dry HEENT: Normocephalic, atraumatic. Pupils equal round and reactive to light; sclera anicteric; extraocular muscles intact;  Nose without nasal septal hypertrophy Mouth/Parynx benign; Mallinpatti scale 3 Neck: No JVD, no carotid bruits; normal carotid upstroke Lungs: clear to ausculatation and percussion; no wheezing or rales Chest wall: without tenderness to palpitation Heart: PMI not displaced, RRR, s1 s2 normal, 1/6 systolic murmur, no diastolic murmur, no rubs, gallops, thrills, or heaves Abdomen: No renal artery or abdominal bruits; soft, nontender; no hepatosplenomehaly, BS+; abdominal aorta nontender and not dilated by palpation. Back: no CVA tenderness Pulses 2+ Musculoskeletal: full range of motion, normal strength, no joint deformities Extremities: no clubbing cyanosis or edema, Homan's sign negative  Neurologic: grossly nonfocal; Cranial nerves grossly wnl Psychologic: Normal mood and affect   ECG (independently read by me): Sinus bradycardia at 65 bpm with sinus arrhythmia, PACs, first-degree AV block with a PR interval _1 ms, right bundle branch block with repolarization changes, left anterior hemiblock.  Septal Q wave V1.  July 21, 2017 ECG (independently read by me): Sinus bradycardia at 47 bpm.  First-degree AV block with a PR interval of 222 ms.  Right bundle branch block with repolarization changes.  PAC.  October 2018 ECG (independently read by me): Sinus bradycardia at 47 bpm. Accelerated AV conduction with a PR interval at 88 ms.  Right bundle-branch block with  repolarization changes.  However, question blocked PACs every other beat.  April 2016 ECG (independently read by me): Sinus bradycardia at 54 bpm with right bundle branch block.  PAC.  QTc interval 479 msec.  PR interval 196 ms.  September 2014 ECG: Sinus bradycardia with first-degree AV block. Heart rate 46 beats per minute. Right bundle branch block, unchanged.  LABS: BMP Latest Ref Rng & Units 04/20/2017 06/28/2015 06/27/2015  Glucose 65 - 99 mg/dL 96 110(H) 96  BUN 8 - 27 mg/dL 14 19 21(H)  Creatinine 0.76 - 1.27 mg/dL 1.04 1.15 1.41(H)  BUN/Creat Ratio 10 - 24 13 - -  Sodium 134 - 144 mmol/L 142 138 140  Potassium 3.5 - 5.2 mmol/L 4.6 3.6 4.3  Chloride 96 - 106 mmol/L 106 108 105  CO2 20 - 29 mmol/L _0 Calcium 8.6 - 10.2 mg/dL 9.1 8.2(L) 9.2   Hepatic Function Latest Ref Rng & Units 04/20/2017 06/28/2015 09/11/2011  Total Protein 6.0 - 8.5 g/dL 7.2 5.3(L) -  Albumin 3.6 - 4.8 g/dL 4.1 3.0(L) -  AST 0 - 40 IU/L _1 ALT 0 - 44 IU/L _2 Alk Phosphatase 39 - 117 IU/L 109 69 97  Total Bilirubin 0.0 - 1.2 mg/dL 1.0 1.1 0.4   CBC Latest Ref Rng & Units 04/20/2017 06/29/2015 06/28/2015  WBC 3.4 - 10.8 x10E3/uL 4.3 5.0 6.7  Hemoglobin 13.0 - 17.7 g/dL 12.7(L) 11.4(L) 11.4(L)  Hematocrit 37.5 - 51.0 % 38.1 34.6(L) 35.2(L)  Platelets 150 - 379 x10E3/uL 242 163 185   Lab Results  Component Value Date   MCV 66 (L) 04/20/2017   MCV 68.0 (L) 06/29/2015   MCV 68.6 (L) 06/28/2015   Lab Results  Component Value Date   TSH 1.120 04/20/2017   No results found for: HGBA1C   Lipid Panel     Component Value Date/Time   CHOL 137 04/20/2017 1102   TRIG 52 04/20/2017 1102   HDL 62 04/20/2017 1102   CHOLHDL 2.2 04/20/2017 1102   LDLCALC 65 04/20/2017 1102    RADIOLOGY: No results found.  IMPRESSION:  1. Essential hypertension   2. Grade II diastolic dysfunction   3. Lipid screening   4. Paroxysmal atrial fibrillation (HCC)   5. Renal cysts, acquired, bilateral    6. Mild obesity    ASSESSMENT AND PLAN: Mr. Staggs is 73 year old African American male who has a history of hypertension and CT evidence for small lacunar infarctions most likely of hypertensive etiology. There also is a remote history of rheumatic fever age 57. He had developed transient symptoms of right arm weakness with numbness in August 2012 which led to his MRI of his head which showed scattered small white matter hyper intensities bilaterally with also involvement in the right putamen, bilateral thalamus the also he has some additional changes in the pons, right and left inferior cerebellum.  A prior echo Doppler study has shown restrictive physiology.  His last echo Doppler study in December 2016 showed an EF of 50-55%, LVH, and grade 2 diastolic dysfunction.  There was mild biatrial enlargement.  Acoustical window was poor quality.  He has had issues in the past with bradycardia leading to discontinuance of his metoprolol.  He has a prescription to take on an as-needed basis for PAF.  Over the past year, he is unaware of any recurrent episodes of atrial fibrillation.  His blood pressure today is elevated despite taking 4 drug regimen consisting of amlodipine 10 mg, doxazosin 4 mg, Erbe Sartain 300 mg, and  minoxidil 5 mg daily.  With his previous documentation of at least grade 2 diastolic dysfunction I have elected to add spironolactone and will initiate this at 12.5 mg daily.  I am recommending laboratory be obtained in the fasting state consisting of a comprehensive metabolic panel, CBC, lipid panel and TSH.  His last echo Doppler study was in 2016.  I have recommended a follow-up echo Doppler assessment.  Remotely, hydralazine was used but he was unable to tolerate this and stopped taking this medication.  He continues to be mildly obese with a BMI of 31.9.  I discussed the importance of exercise at least 150 minutes/week with at least 2 days/week of some strength training if at all possible.  I  will see him in 3 months for reevaluation or sooner if problems arise.  Troy Sine, MD, [facility]  08/10/2018 5:41 PM

## 2018-08-10 ENCOUNTER — Encounter: Payer: Self-pay | Admitting: Cardiovascular Disease

## 2018-08-11 ENCOUNTER — Telehealth: Payer: Self-pay | Admitting: Cardiovascular Disease

## 2018-08-11 NOTE — Telephone Encounter (Signed)
New Message   Patient wants to know if the doctor could order PSA labs on him as well.

## 2018-08-11 NOTE — Telephone Encounter (Signed)
Routed to [facility] LPN

## 2018-08-13 LAB — COMPREHENSIVE METABOLIC PANEL
A/G RATIO: 1.6 (ref 1.2–2.2)
ALK PHOS: 99 IU/L (ref 39–117)
ALT: 19 IU/L (ref 0–44)
AST: 25 IU/L (ref 0–40)
Albumin: 4.2 g/dL (ref 3.8–4.8)
BILIRUBIN TOTAL: 1.2 mg/dL (ref 0.0–1.2)
BUN/Creatinine Ratio: 12 (ref 10–24)
BUN: 13 mg/dL (ref 8–27)
CHLORIDE: 102 mmol/L (ref 96–106)
CO2: 23 mmol/L (ref 20–29)
Calcium: 9.4 mg/dL (ref 8.6–10.2)
Creatinine, Ser: 1.06 mg/dL (ref 0.76–1.27)
GFR calc non Af Amer: 71 mL/min/{1.73_m2} (ref >59)
GFR, EST AFRICAN AMERICAN: 82 mL/min/{1.73_m2} (ref >59)
Globulin, Total: 2.6 g/dL (ref 1.5–4.5)
Glucose: 95 mg/dL (ref 65–99)
POTASSIUM: 4.7 mmol/L (ref 3.5–5.2)
Sodium: 140 mmol/L (ref 134–144)
TOTAL PROTEIN: 6.8 g/dL (ref 6.0–8.5)

## 2018-08-13 LAB — CBC
HEMOGLOBIN: 12.4 g/dL — AB (ref 13.0–17.7)
Hematocrit: 40.3 % (ref 37.5–51.0)
MCH: 21.6 pg — ABNORMAL LOW (ref 26.6–33.0)
MCHC: 30.8 g/dL — AB (ref 31.5–35.7)
MCV: 70 fL — ABNORMAL LOW (ref 79–97)
Platelets: 236 10*3/uL (ref 150–450)
RBC: 5.75 x10E6/uL (ref 4.14–5.80)
RDW: 15.2 % (ref 11.6–15.4)
WBC: 3.6 10*3/uL (ref 3.4–10.8)

## 2018-08-13 LAB — LIPID PANEL
CHOLESTEROL TOTAL: 158 mg/dL (ref 100–199)
Chol/HDL Ratio: 2 ratio (ref 0.0–5.0)
HDL: 79 mg/dL (ref >39)
LDL CALC: 70 mg/dL (ref 0–99)
TRIGLYCERIDES: 44 mg/dL (ref 0–149)
VLDL Cholesterol Cal: 9 mg/dL (ref 5–40)

## 2018-08-13 LAB — TSH: TSH: 1.53 u[IU]/mL (ref 0.450–4.500)

## 2018-08-15 NOTE — Telephone Encounter (Signed)
Ok to add PSA to lab draw

## 2018-08-15 NOTE — Telephone Encounter (Signed)
Added PSA labs, called to notify patient, also gave recent lab work results.

## 2018-08-17 ENCOUNTER — Other Ambulatory Visit (HOSPITAL_COMMUNITY): Payer: Medicare Other

## 2018-08-19 LAB — PSA: PROSTATE SPECIFIC AG, SERUM: 0.6 ng/mL (ref 0.0–4.0)

## 2018-08-19 LAB — SPECIMEN STATUS REPORT

## 2018-08-29 ENCOUNTER — Ambulatory Visit (HOSPITAL_COMMUNITY): Payer: Medicare Other | Attending: Cardiology

## 2018-08-29 DIAGNOSIS — I480 Paroxysmal atrial fibrillation: Secondary | ICD-10-CM | POA: Diagnosis present

## 2018-08-29 DIAGNOSIS — I10 Essential (primary) hypertension: Secondary | ICD-10-CM | POA: Insufficient documentation

## 2018-09-26 ENCOUNTER — Other Ambulatory Visit: Payer: Self-pay | Admitting: *Deleted

## 2018-09-26 MED ORDER — AMLODIPINE BESYLATE 10 MG PO TABS: 10.0000 mg | ORAL_TABLET | Freq: Every day | ORAL | 1 refills | Status: DC

## 2018-11-07 ENCOUNTER — Telehealth: Payer: Self-pay | Admitting: Cardiovascular Disease

## 2018-11-07 NOTE — Telephone Encounter (Signed)
Smart phone/my chart/pre reg completed 11/07/2018/ttf

## 2018-11-07 NOTE — Telephone Encounter (Signed)
10:20 spoke with patient VX:[id number] for 11/08/18

## 2018-11-07 NOTE — Telephone Encounter (Signed)
Virtual Visit Pre-Appointment Phone Call  "(Name), I am calling you today to discuss your upcoming appointment. We are currently trying to limit exposure to the virus that causes COVID-19 by seeing patients at home rather than in the office."  1. "What is the BEST phone number to call the day of the visit?" - include this in appointment notes  2. Do you have or have access to (through a family member/friend) a smartphone with video capability that we can use for your visit?" a. If yes - list this number in appt notes as cell (if different from BEST phone #) and list the appointment type as a VIDEO visit in appointment notes b. If no - list the appointment type as a PHONE visit in appointment notes  3. Confirm consent - "In the setting of the current Covid19 crisis, you are scheduled for a (phone or video) visit with your provider on (date) at (time).  Just as we do with many in-office visits, in order for you to participate in this visit, we must obtain consent.  If you'd like, I can send this to your mychart (if signed up) or email for you to review.  Otherwise, I can obtain your verbal consent now.  All virtual visits are billed to your insurance company just like a normal visit would be.  By agreeing to a virtual visit, we'd like you to understand that the technology does not allow for your provider to perform an examination, and thus may limit your provider's ability to fully assess your condition. If your provider identifies any concerns that need to be evaluated in person, we will make arrangements to do so.  Finally, though the technology is pretty good, we cannot assure that it will always work on either your or our end, and in the setting of a video visit, we may have to convert it to a phone-only visit.  In either situation, we cannot ensure that we have a secure connection.  Are you willing to proceed?" STAFF: Did the patient verbally acknowledge consent to telehealth visit? Document  YES/NO here: Yes  4. Advise patient to be prepared - "Two hours prior to your appointment, go ahead and check your blood pressure, pulse, oxygen saturation, and your weight (if you have the equipment to check those) and write them all down. When your visit starts, your provider will ask you for this information. If you have an Apple Watch or Kardia device, please plan to have heart rate information ready on the day of your appointment. Please have a pen and paper handy nearby the day of the visit as well."  5. Give patient instructions for MyChart download to smartphone OR Doximity/Doxy.me as below if video visit (depending on what platform provider is using)  6. Inform patient they will receive a phone call 15 minutes prior to their appointment time (may be from unknown caller ID) so they should be prepared to answer    Joseph [name] has been deemed a candidate for a follow-up tele-health visit to limit community exposure during the Covid-19 pandemic. I spoke with the patient via phone to ensure availability of phone/video source, confirm preferred email & phone number, and discuss instructions and expectations.  I reminded Joseph [name] to be prepared with any vital sign and/or heart rhythm information that could potentially be obtained via home monitoring, at the time of his visit. I reminded Joseph [name] to expect a phone call prior to  his visit.  Therisa Doyne 11/07/2018 1:37 PM   Pt stated he is able to do video visit and is going to sign up for MyChart. He also stated he has a bp cuff and weight scale at home.  IF USING DOXIMITY or DOXY.ME - The patient will receive a link just prior to their visit by text.     FULL LENGTH CONSENT FOR TELE-HEALTH VISIT   I hereby voluntarily request, consent and authorize [facility] and its employed or contracted physicians, physician assistants, nurse practitioners or other licensed health care professionals  (the Practitioner), to provide me with telemedicine health care services (the Services") as deemed necessary by the treating Practitioner. I acknowledge and consent to receive the Services by the Practitioner via telemedicine. I understand that the telemedicine visit will involve communicating with the Practitioner through live audiovisual communication technology and the disclosure of certain medical information by electronic transmission. I acknowledge that I have been given the opportunity to request an in-person assessment or other available alternative prior to the telemedicine visit and am voluntarily participating in the telemedicine visit.  I understand that I have the right to withhold or withdraw my consent to the use of telemedicine in the course of my care at any time, without affecting my right to future care or treatment, and that the Practitioner or I may terminate the telemedicine visit at any time. I understand that I have the right to inspect all information obtained and/or recorded in the course of the telemedicine visit and may receive copies of available information for a reasonable fee.  I understand that some of the potential risks of receiving the Services via telemedicine include:   Delay or interruption in medical evaluation due to technological equipment failure or disruption;  Information transmitted may not be sufficient (e.g. poor resolution of images) to allow for appropriate medical decision making by the Practitioner; and/or   In rare instances, security protocols could fail, causing a breach of personal health information.  Furthermore, I acknowledge that it is my responsibility to provide information about my medical history, conditions and care that is complete and accurate to the best of my ability. I acknowledge that Practitioner's advice, recommendations, and/or decision may be based on factors not within their control, such as incomplete or inaccurate data provided  by me or distortions of diagnostic images or specimens that may result from electronic transmissions. I understand that the practice of medicine is not an exact science and that Practitioner makes no warranties or guarantees regarding treatment outcomes. I acknowledge that I will receive a copy of this consent concurrently upon execution via email to the email address I last provided but may also request a printed copy by calling the office of [facility].    I understand that my insurance will be billed for this visit.   I have read or had this consent read to me.  I understand the contents of this consent, which adequately explains the benefits and risks of the Services being provided via telemedicine.   I have been provided ample opportunity to ask questions regarding this consent and the Services and have had my questions answered to my satisfaction.  I give my informed consent for the services to be provided through the use of telemedicine in my medical care  By participating in this telemedicine visit I agree to the above.

## 2018-11-08 ENCOUNTER — Telehealth (INDEPENDENT_AMBULATORY_CARE_PROVIDER_SITE_OTHER): Payer: Medicare Other | Admitting: Cardiovascular Disease

## 2018-11-08 ENCOUNTER — Encounter: Payer: Self-pay | Admitting: Cardiovascular Disease

## 2018-11-08 VITALS — BP 146/70 | HR 62 | Ht 70.0 in

## 2018-11-08 DIAGNOSIS — I10 Essential (primary) hypertension: Secondary | ICD-10-CM

## 2018-11-08 DIAGNOSIS — E669 Obesity, unspecified: Secondary | ICD-10-CM

## 2018-11-08 DIAGNOSIS — I519 Heart disease, unspecified: Secondary | ICD-10-CM | POA: Diagnosis not present

## 2018-11-08 DIAGNOSIS — N281 Cyst of kidney, acquired: Secondary | ICD-10-CM | POA: Diagnosis not present

## 2018-11-08 DIAGNOSIS — I5189 Other ill-defined heart diseases: Secondary | ICD-10-CM

## 2018-11-08 DIAGNOSIS — I480 Paroxysmal atrial fibrillation: Secondary | ICD-10-CM | POA: Diagnosis not present

## 2018-11-08 DIAGNOSIS — Z7901 Long term (current) use of anticoagulants: Secondary | ICD-10-CM

## 2018-11-08 DIAGNOSIS — R001 Bradycardia, unspecified: Secondary | ICD-10-CM

## 2018-11-08 NOTE — Progress Notes (Signed)
Virtual Visit via Video Note   This visit type was conducted due to national recommendations for restrictions regarding the COVID-19 Pandemic (e.g. social distancing) in an effort to limit this patient's exposure and mitigate transmission in our community.  Due to his co-morbid illnesses, this patient is at least at moderate risk for complications without adequate follow up.  This format is felt to be most appropriate for this patient at this time.  All issues noted in this document were discussed and addressed.  A limited physical exam was performed with this format.  Please refer to the patient's chart for his consent to telehealth for [facility].   Evaluation Performed:  Follow-up visit  Date:  11/08/2018   ID:  Joseph [name], Joseph [name] [birth year], MRN [id number]  Patient Location: Home Provider Location: Office  PCP:  Lemmie Evens, MD  Cardiologist:  Shelva Majestic, MD  Electrophysiologist:  None   Chief Complaint:  3 month F/U  History of Present Illness:    Joseph [name] is a 73 y.o. male who by with Saturday has a long-standing history of hypertension. A CT scan in 2013 suggested small lacunar infarctions most likely due to hypertension. I initially saw in April 2013 after this CT imaging study was done. He has documented normal systolic function with mild concentric LVH with diastolic dysfunction. An echo Doppler study in April 2013 revealed his mitral valve E/A ratio is 3.8 suggestive of restrictive physiology. He has documented renal cysts involving the left kidney showing a large inferior pole cortical cyst is measured 4.5 x 4.7 x 4.4 cm. In addition there was a second area at the mid pole laterally which appeared multi-septated and measured 2.7 x 2.7 x 1.9. I  recommended urologic evaluation which he did at [facility] and he was told that these were benign.  Joseph [name] has a history of moderate obesity.  In the past.  He had weight as high as 250 pounds.  Over the  past several years.  His weight has been in the 220s.  When I last saw him, he was walking 3 miles at a time 3 days per week and denied any exertional chest pain or palpitations.   Over the past several years, he has been seen by [name] and also saw [name], [facility] for blood pressure management.  He last saw [name] on 03/18/2017, who switched losartan to irbesartan.  His blood pressure medications now include amlodipine 10 mg, irbesartan 300 mg, doxazosin 8 mg,minoxidil 5 mg, and Toprol, which she has been taking 25 mg as needed, but has not been taking this regularly.  He denies any episodes of chest pressure.  His last echo Doppler study was in December 2016 which showed mild LVH, EF 50-55% with grade 2 diastolic dysfunction.   When I saw him in October 2018 he complained of fatigue and had noted a slow pulse.  He was bradycardic with heart rates in the 40s and I discontinued Toprol.  He was hypertensive and I recommended initiation of hydralazine 25 mg twice a day.  I scheduled him for renal Doppler studies to make certain there was no renal vascular etiology to his hypertension on multiple medical drug regimen.  Renal Doppler study did not demonstrate any evidence for renal artery stenosis.  He again was noted to have previously documented renal cysts with the right upper pole cyst at 1.5 x 1.7 cm, and a left exophytic lower pole cyst at 4.5 x 5.4 cm and  left upper pole striated cyst at 3.6 x 2.5 cm.  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  He denies any abdominal complaints.  He had stopped taking hydralazine when he called the office in December 2018 due to feeling that he was having lower extremity edema.  He also felt that the hydralazine was contributing to his fatigue.  His edema has improved with diabetic socks.    I  saw him in January 2019 at which time he was bradycardic with a heart rate at 47 bpm.  He was unaware of any  recurrent atrial fibrillation episodes and continue to be on Eliquis 5 mg twice a day.  His blood pressure was controlled on amlodipine 10 mg, Cardura 4 mg at bedtime, irbesartan 300 mg, in addition to minoxidil 5 mg daily.  Over the past year, he has continued to be followed by Dr. [name].  He denies any recurrent arrhythmia.  He denies chest pressure or palpitations.  He has continued to be successful with his previous weight loss.  I last saw him in January 2020 and at that time his blood pressure was elevated at 158/80.  With his previous history of grade 2 diastolic dysfunction I elected to add Spironolactone 12.5 mg to his regimen.  Remotely I had placed him on hydralazine which he did not tolerate.  Presently, he states his blood pressure at times is up and down.  He apparently has only been taking the Spironolactone every other day.  At times his blood pressure can reach 782 systolically.  Pulse 50.  He recently began to exercise more than he had previously and has been walking 3 miles at least 3 days/week.  He underwent a follow-up echo Doppler study in February 2020 which showed mild concentric LVH with EF 55 to 60%.  Diastolic function could not be evaluated due to poor image quality  The patient does not have symptoms concerning for COVID-19 infection (fever, chills, cough, or new shortness of breath).    Past Medical History:  Diagnosis Date  . 1st degree AV block   . 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 Diastolic dysfunction 9/56   grade 1  . Hypertension   . New onset atrial fibrillation ([place]) 06/2015  . RBBB   . Sinus bradycardia   . Stroke [facility])    no defecits, pt denies having a stroke, says [name] diagnosed this   Past Surgical History:  Procedure Laterality Date  . COLONOSCOPY    . COLONOSCOPY  12/30/2011   Procedure: COLONOSCOPY;  Surgeon: [name], MD;  [facility];  Service: Endoscopy;  Laterality: N/A;  1:45PM  . EXCISION MASS LOWER EXTREMETIES  Left 05/12/2018   Procedure: Left foot plantar fibroma excision;  Surgeon: [name], MD;  Location: [place];  Service: Orthopedics;  Laterality: Left;     Current Meds  Medication Sig  . amLODipine (NORVASC) 10 MG tablet Take 1 tablet (10 mg total) by mouth daily.  Marland Kitchen apixaban (ELIQUIS) 5 MG TABS tablet Take 1 tablet (5 mg total) by mouth 2 (two) times daily.  Marland Kitchen doxazosin (CARDURA) 4 MG tablet Take 8 mg by mouth at bedtime.   . irbesartan (AVAPRO) 300 MG tablet TAKE 1 TABLET BY MOUTH DAILY. START AFTER LAST DOSE OF LOSARTAN  . minoxidil (LONITEN) 2.5 MG tablet Take 5 mg by mouth daily.   . saw palmetto 160 MG  capsule Take 160 mg by mouth 2 (two) times daily.  Marland Kitchen spironolactone (ALDACTONE) 25 MG tablet Take 0.5 tablets (12.5 mg total) by mouth daily. (Patient taking differently: Take 12.5 mg by mouth every other day. )  . UNABLE TO FIND Take 2 tablets by mouth daily. Med Name: Super Beta Prostate     Allergies:   Patient has no known allergies.   Social History   Tobacco Use  . Smoking status: Former Smoker    Packs/day: 0.00    Years: 4.00    Pack years: 0.00  . Smokeless tobacco: Never Used  . Tobacco comment: quit about 40 yrs ago  Substance Use Topics  . Alcohol use: Yes    Alcohol/week: 0.0 standard drinks    Comment: 6 pack of beer in a week  . 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  Family Hx: The patient's family history includes CVA in his mother; Cancer in his brother; Hypertension in his father. There is no history of Colon cancer.  ROS:   Please see the history of present illness.    No fevers chills night sweats, cough, loss of taste or smell He is not short of breath and feels he is breathing well He is unaware of any recurrent atrial fibrillation No Wheezing No chest pain No dyspnea No bleeding No leg swelling No rashes No myalgias He believes he is sleeping well.  He is unaware of any  snoring  All other systems reviewed and are negative.   Prior CV studies:   The following studies were reviewed today:  ECHO IMPRESSIONS 08/29/2018  1. The left ventricle has normal systolic function, with an ejection fraction of 55-60%. The cavity size was normal. There is mild concentric left ventricular hypertrophy. Left ventricular diastology could not be evaluated due to nondiagnostic images.  2. The right ventricle has normal systolic function. The cavity was mildly enlarged. There is no increase in right ventricular wall thickness. Right ventricular systolic pressure normal with an estimated pressure of 18.1 mmHg.  3. Left atrial size was mildly dilated.  4. The mitral valve is normal in structure.  5. The tricuspid valve is normal in structure.  6. The aortic valve is tricuspid Mild thickening of the aortic valve Sclerosis without any evidence of stenosis of the aortic valve.  7. Right atrial pressure is estimated at 3 mmHg.  SUMMARY   Normal LV EF, limited sensitivity for minor wall motion abnormalities due to technical limitations. Of note, the patient seemed to vary between sinus bradycardia and slow regular rhythm in the 40s. Based on limited echo ECG, this may be very prolonged 1st degree heart block, slow atrial fibrillation, or possibly complete heart block. Discussed with Dr. Claiborne Billings, recommend clinical correlation.  Labs/Other Tests and Data Reviewed:    EKG:  An ECG dated 08/09/2018 was personally reviewed today and demonstrated:  Sinus bradycardia at 65 bpm with sinus arrhythmia, PACs, first-degree AV block with a PR interval 2 6 6  ms, right bundle branch block with repolarization changes, left anterior hemiblock.  Septal Q wave V1.  Recent Labs: 08/12/2018: ALT 19; BUN 13; Creatinine, Ser 1.06; Hemoglobin 12.4; Platelets 236; Potassium 4.7; Sodium 140; TSH 1.530   Recent Lipid Panel Lab Results  Component Value Date/Time   CHOL 158 08/12/2018 11:36 AM   TRIG 44  08/12/2018 11:36 AM   HDL 79 08/12/2018 11:36 AM   CHOLHDL 2.0 08/12/2018 11:36 AM   LDLCALC 70  08/12/2018 11:36 AM    Wt Readings from Last 3 Encounters:  08/09/18 222 lb (100.7 kg)  05/12/18 214 lb 11.7 oz (97.4 kg)  07/21/17 224 lb 3.2 oz (101.7 kg)     Objective:    Vital Signs:  BP (!) 146/70 Comment: taken by pt at 9 A M  Pulse 62   Ht 5\' 10"  (1.778 m)   BMI 31.85 kg/m    He appears well-developed and well-nourished in no acute distress Breathing was normal and not labored HEENT appeared unremarkable He had a thick neck.  I did not appear to be any JVD. Appears euvolemic on exam Did not appreciate any audible wheezing He denied any chest pressure palpitations or abdominal discomfort Denies significant leg swelling Neurologically he appeared intact He had normal cognition, affect and mood  ASSESSMENT & PLAN:    1. Essential hypertension: He admits to recent blood pressure lability.  Apparently has only been taking the Spironolactone 12.5 mg every other day.  He continues to take irbesartan 300 mg daily, amlodipine 10 mg, doxazosin 8 mg, and minoxidil 5 mg.  His recent blood pressure lability, I have recommended he resume spironolactone at 12.5 mg daily.  He will monitor his blood pressure, blood pressure consistently stays 40s or above he will further titrate the Spironolactone to 12.5 mg twice a day. 2. Grade 2 diastolic dysfunction: Contributed by his hypertension.  His most recent echo Doppler study poor image quality and as result, diastology  could not be assessed. 3. PAF: He states his heart rate is regular typically around 50.  He continues to be on Eliquis for anticoagulation. 4. Anticoagulation: Adequate on Eliquis without bleeding 5. 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 Mild obesity: We discussed improved exercise and weight loss.  He has been walking 3 miles at least 3 days/week.  I have suggested if possible please exercise to 5 days/week.  We  discussed optimal diet cardiovascular perspective discussed Mediterranean diet.  COVID-19 Education: The signs and symptoms of COVID-19 were discussed with the patient and how to seek care for testing (follow up with PCP or arrange E-visit).  The importance of social distancing was discussed today.  Time:   Today, I have spent 25 minutes with the patient with telehealth technology discussing the above problems.     Medication Adjustments/Labs and Tests Ordered: Current medicines are reviewed at length with the patient today.  Concerns regarding medicines are outlined above.   Tests Ordered: No orders of the defined types were placed in this encounter.   Medication Changes: No orders of the defined types were placed in this encounter.   Disposition:  Follow up 6 months Signed, Shelva Majestic, MD  11/08/2018 12:28 PM    [place]

## 2018-11-08 NOTE — Patient Instructions (Signed)

## 2018-12-30 ENCOUNTER — Other Ambulatory Visit: Payer: Self-pay | Admitting: Cardiovascular Disease

## 2019-02-15 ENCOUNTER — Telehealth: Payer: Self-pay | Admitting: Internal Medicine

## 2019-02-15 NOTE — Telephone Encounter (Signed)
Patient wife had procedure with rmr and he thinks is overdue for a procedure, does he need a nurse or an office visit? Please advise

## 2019-02-15 NOTE — Telephone Encounter (Signed)
Pt is on eliquis. He will need an ov.

## 2019-02-16 NOTE — Telephone Encounter (Signed)
Called patient and scheduled.

## 2019-02-27 ENCOUNTER — Encounter: Payer: Self-pay | Admitting: Nurse Practitioner

## 2019-02-27 ENCOUNTER — Other Ambulatory Visit: Payer: Self-pay

## 2019-02-27 ENCOUNTER — Ambulatory Visit (INDEPENDENT_AMBULATORY_CARE_PROVIDER_SITE_OTHER): Payer: Medicare Other | Admitting: Nurse Practitioner

## 2019-02-27 DIAGNOSIS — Z86010 Personal history of colonic polyps: Secondary | ICD-10-CM | POA: Diagnosis not present

## 2019-02-27 DIAGNOSIS — Z860101 Personal history of adenomatous and serrated colon polyps: Secondary | ICD-10-CM

## 2019-02-27 NOTE — Progress Notes (Signed)
Primary Care Physician:  Lemmie Evens, MD Primary Gastroenterologist:  Dr. Gala Romney  Chief Complaint  Patient presents with  . Consult    TCS last done 2013    HPI:   Joseph [name] is a 73 y.o. male who presents for scheduling of colonoscopy.  Nurse/phone triage was deferred office visit due to Eliquis anticoagulation.  The patient's last colonoscopy was 12/30/2011 which found ischemic or segmental colitis status post biopsy, pancolonic diverticulosis, cecal polyp.  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  Recommended repeat colonoscopy in 5 years.  Today he states he's doing ok overall. He is a little apprehensive about his colonoscopy. Thinks he feels a knott in his right groin. Denies abdominal pain, N/V, hematochezia, melena, fever, chills, unintentional weight loss. Denies URI or flu-like symptoms. Denies loss of sense of taste or smell. Denies chest pain, dyspnea, dizziness, lightheadedness, syncope, near syncope. Denies any other upper or lower GI symptoms.  Dr. Claiborne Billings with Cardiology manages his Eliquis.  Past Medical History:  Diagnosis Date  . 1st degree AV block   . 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 Hypertension   . New onset atrial fibrillation ([place]) 06/2015  . RBBB   . Sinus bradycardia   . Stroke [facility])    no defecits, pt denies having a stroke, says [name] diagnosed this    Past Surgical History:  Procedure Laterality Date  . COLONOSCOPY    . COLONOSCOPY  12/30/2011   Procedure: COLONOSCOPY;  Surgeon: [name], MD;  [facility];  Service: Endoscopy;  Laterality: N/A;  1:45PM  . EXCISION MASS LOWER EXTREMETIES Left 05/12/2018   Procedure: Left foot plantar fibroma excision;  Surgeon: [name], MD;  Location: [place];  Service: Orthopedics;  Laterality: Left;    Current Outpatient Medications   Medication Sig Dispense Refill  . amLODipine (NORVASC) 10 MG tablet Take 1 tablet (10 mg total) by mouth daily. 90 tablet 1  . apixaban (ELIQUIS) 5 MG TABS tablet Take 1 tablet (5 mg total) by mouth 2 (two) times daily. 84 tablet 0  . cholecalciferol (VITAMIN D3) 25 MCG (1000 UT) tablet Take 1,000 Units by mouth daily.    Marland Kitchen doxazosin (CARDURA) 8 MG tablet Take 8 mg by mouth at bedtime.     . irbesartan (AVAPRO) 300 MG tablet TAKE 1 TABLET BY MOUTH DAILY 90 tablet 3  . minoxidil (LONITEN) 2.5 MG tablet Take 5 mg by mouth daily.     . saw palmetto 160 MG capsule Take 160 mg by mouth 2 (two) times daily.    Marland Kitchen spironolactone (ALDACTONE) 25 MG tablet Take 0.5 tablets (12.5 mg total) by mouth daily. (Patient taking differently: Take 12.5 mg by mouth every other day. ) 60 tablet 1  . UNABLE TO FIND Take 2 tablets by mouth daily. Med Name: Super Beta Prostate     No current facility-administered medications for this visit.     Allergies as of 02/27/2019  . (No Known Allergies)    Family History  Problem Relation Age of Onset  . Cancer Brother   . CVA Mother   . Hypertension Father   . Colon cancer Neg Hx     Social History   Socioeconomic History  . Marital status: Married    Spouse name: Not on file  . Number of children: Not  on file  . Years of education: Not on file  . Highest education level: Not on file  Occupational History  . Not on file  Social Needs  . Financial resource strain: Not on file  . Food insecurity    Worry: Not on file    Inability: Not on file  . Transportation needs    Medical: Not on file    Non-medical: Not on file  Tobacco Use  . Smoking status: Former Smoker    Packs/day: 0.00    Years: 4.00    Pack years: 0.00  . Smokeless tobacco: Never Used  . Tobacco comment: quit about 40 yrs ago  Substance and Sexual Activity  . Alcohol use: Yes    Alcohol/week: 0.0 standard drinks    Comment: 6 pack of beer in a week  . Drug use: No  . Sexual  activity: Not on file  Lifestyle  . Physical activity    Days per week: Not on file    Minutes per session: Not on file  . Stress: Not on file  Relationships  . Social Herbalist on phone: Not on file    Gets together: Not on file    Attends religious service: Not on file    Active member of club or organization: Not on file    Attends meetings of clubs or organizations: Not on file    Relationship status: Not on file  . Intimate partner violence    Fear of current or ex partner: Not on file    Emotionally abused: Not on file    Physically abused: Not on file    Forced sexual activity: Not on file  Other Topics Concern  . Not on file  Social History Narrative  . Not on file    Review of Systems: General: Negative for anorexia, weight loss, fever, chills, fatigue, weakness. ENT: Negative for hoarseness, difficulty swallowing. CV: Negative for chest pain, angina, palpitations, peripheral edema.  Respiratory: Negative for dyspnea at rest, cough, sputum, wheezing.  GI: See history of present illness.  MS: Negative for joint pain, low back pain.  Derm: Negative for rash or itching.  Endo: Negative for unusual weight change.  Heme: Negative for bruising or bleeding. Allergy: Negative for rash or hives.    Physical Exam: BP (!) 175/83   Pulse (!) 50   Temp (!) 97 F (36.1 C) (Oral)   Ht 5\' 10"  (1.778 m)   Wt 208 lb (94.3 kg)   BMI 29.84 kg/m  General:   Alert and oriented. Pleasant and cooperative. Well-nourished and well-developed.  Head:  Normocephalic and atraumatic. Eyes:  Without icterus, sclera clear and conjunctiva pink.  Ears:  Normal auditory acuity. Cardiovascular:  S1, S2 present without murmurs appreciated. Extremities without clubbing or edema. Respiratory:  Clear to auscultation bilaterally. No wheezes, rales, or rhonchi. No distress.  Gastrointestinal:  +BS, soft, non-tender and non-distended. No HSM noted. No guarding or rebound. No masses  appreciated.  Rectal:  Deferred  Musculoskalatal:  Symmetrical without gross deformities. Neurologic:  Alert and oriented x4;  grossly normal neurologically. Psych:  Alert and cooperative. Normal mood and affect. Heme/Lymph/Immune: No excessive bruising noted.    02/27/2019 8:20 AM   Disclaimer: This note was dictated with voice recognition software. Similar sounding words can inadvertently be transcribed and may not be corrected upon review.

## 2019-02-27 NOTE — Assessment & Plan Note (Addendum)
The patient is currently overdue for repeat colonoscopy.  His last one was in 2013 which found tubular adenoma and recommended 5-year repeat (2018).  He is about 2 years overdue.  Denies any overt GI symptoms other than a "small knot" in his right groin consistent with inguinal hernia.  It is nontender, not hard, not particularly prominent.  Recommend he monitor and notify a care provider if it changes such as more prominent, hard, painful, nonreducible.  At this point proceed with colonoscopy.  Proceed with TCS with Dr. Gala Romney in near future: the risks, benefits, and alternatives have been discussed with the patient in detail. The patient states understanding and desires to proceed.  The patient is currently on Eliquis.  He is not on any other anticoagulants, anxiolytics, chronic pain medications, antidepressants, antidiabetic medications, or iron supplements.  Conscious sedation should be adequate for his procedure as it was per his last.  We will reach out to cardiology for their okay to hold his Eliquis for 3 days prior to his exam.

## 2019-02-27 NOTE — Patient Instructions (Signed)
Your health issues we discussed today were:   Need for colonoscopy: 1. We will schedule your colonoscopy for you 2. We will call your cardiologist and get the okay to hold your Eliquis for 2 days prior to your exam 3. Further recommendations will be made based on results of your colonoscopy.  Overall I recommend:  1. Continue your other current medications 2. Follow-up based on recommendations made after your colonoscopy, or as needed for GI symptoms 3. Call us if you have any questions or concerns.   Because of recent events of COVID-19 ("Coronavirus"), follow CDC recommendations:  1. Wash your hand frequently 2. Avoid touching your face 3. Stay away from people who are sick 4. If you have symptoms such as fever, cough, shortness of breath then call your healthcare provider for further guidance 5. If you are sick, STAY AT HOME unless otherwise directed by your healthcare provider. 6. Follow directions from state and national officials regarding staying safe   At [facility] we value your feedback. You may receive a survey about your visit today. Please share your experience as we strive to create trusting relationships with our patients to provide genuine, compassionate, quality care.  We appreciate your understanding and patience as we review any laboratory studies, imaging, and other diagnostic tests that are ordered as we care for you. Our office policy is 5 business days for review of these results, and any emergent or urgent results are addressed in a timely manner for your best interest. If you do not hear from our office in 1 week, please contact us.   We also encourage the use of MyChart, which contains your medical information for your review as well. If you are not enrolled in this feature, an access code is on this after visit summary for your convenience. Thank you for allowing [place] to be involved in your care.  It was great to see you today!  I hope you have a  great summer!!

## 2019-02-28 ENCOUNTER — Other Ambulatory Visit: Payer: Self-pay

## 2019-02-28 ENCOUNTER — Telehealth: Payer: Self-pay | Admitting: Gastroenterology

## 2019-02-28 ENCOUNTER — Telehealth: Payer: Self-pay

## 2019-02-28 DIAGNOSIS — Z86010 Personal history of colonic polyps: Secondary | ICD-10-CM

## 2019-02-28 DIAGNOSIS — Z860101 Personal history of adenomatous and serrated colon polyps: Secondary | ICD-10-CM

## 2019-02-28 MED ORDER — PEG 3350-KCL-NA BICARB-NACL 420 G PO SOLR: 4000.0000 mL | ORAL | 0 refills | Status: DC

## 2019-02-28 NOTE — Telephone Encounter (Signed)
Tried to call pt, no answer, LMOVM for return call.  

## 2019-02-28 NOTE — Telephone Encounter (Signed)
Dr. Claiborne Billings,   This mutual patient was seen in our office by Walden Field, NP on 02/27/2019.  [name] would like to have him scheduled for a colonoscopy with Dr. Gala Romney in the very near future.  Please advise if it is OK for him to [facility] for 72 hours prior to procedure.   Thanks so much!   [name], LPN

## 2019-02-28 NOTE — Addendum Note (Signed)
Addended by: Hassan Rowan on: 02/28/2019 12:53 PM   Modules accepted: Orders

## 2019-02-28 NOTE — Telephone Encounter (Signed)
Pt was returning a call. [phone]

## 2019-02-28 NOTE — Telephone Encounter (Signed)
Sending FYI to [name] and [place].

## 2019-02-28 NOTE — Telephone Encounter (Signed)
See other phone note for today. 

## 2019-02-28 NOTE — Telephone Encounter (Signed)
Ok to hold eliquis for 72 hrs

## 2019-02-28 NOTE — Telephone Encounter (Signed)
Spoke to pt, TCS w/RMR scheduled for 04/19/19 at 2:00pm. He is aware to hold Eliquis for 72 hours before TCS. COVID test appt 04/17/19 at 9:00am. He is aware to quarantine at home after the test until procedure. Rx for prep sent to pharmacy. Orders entered.

## 2019-02-28 NOTE — Telephone Encounter (Signed)
Noted  

## 2019-02-28 NOTE — Telephone Encounter (Signed)
Per [facility] website, no PA needed for TCS:  BLOCKING - Product does not have notification requirements.

## 2019-03-30 ENCOUNTER — Other Ambulatory Visit: Payer: Self-pay

## 2019-03-30 MED ORDER — AMLODIPINE BESYLATE 10 MG PO TABS: 10.0000 mg | ORAL_TABLET | Freq: Every day | ORAL | 1 refills | Status: DC

## 2019-04-17 ENCOUNTER — Other Ambulatory Visit: Payer: Self-pay

## 2019-04-17 ENCOUNTER — Other Ambulatory Visit (HOSPITAL_COMMUNITY)
Admission: RE | Admit: 2019-04-17 | Discharge: 2019-04-17 | Disposition: A | Discharge status: Home / Self Care | Payer: Medicare Other | Source: Ambulatory Visit | Attending: Internal Medicine | Admitting: Internal Medicine

## 2019-04-17 DIAGNOSIS — Z01812 Encounter for preprocedural laboratory examination: Secondary | ICD-10-CM | POA: Diagnosis present

## 2019-04-17 DIAGNOSIS — Z20828 Contact with and (suspected) exposure to other viral communicable diseases: Secondary | ICD-10-CM | POA: Diagnosis not present

## 2019-04-17 LAB — SARS CORONAVIRUS 2 (TAT 6-24 HRS): SARS Coronavirus 2: NEGATIVE

## 2019-04-19 ENCOUNTER — Encounter (HOSPITAL_COMMUNITY): Payer: Self-pay | Admitting: *Deleted

## 2019-04-19 ENCOUNTER — Encounter (HOSPITAL_COMMUNITY)
Admission: RE | Disposition: A | Discharge status: Home / Self Care | Payer: Self-pay | Source: Home / Self Care | Attending: Internal Medicine

## 2019-04-19 ENCOUNTER — Ambulatory Visit (HOSPITAL_COMMUNITY)
Admission: RE | Admit: 2019-04-19 | Discharge: 2019-04-19 | Disposition: A | Discharge status: Home / Self Care | Payer: Medicare Other | Attending: Internal Medicine | Admitting: Internal Medicine

## 2019-04-19 ENCOUNTER — Other Ambulatory Visit: Payer: Self-pay

## 2019-04-19 DIAGNOSIS — Z79899 Other long term (current) drug therapy: Secondary | ICD-10-CM | POA: Diagnosis not present

## 2019-04-19 DIAGNOSIS — I4510 Unspecified right bundle-branch block: Secondary | ICD-10-CM | POA: Insufficient documentation

## 2019-04-19 DIAGNOSIS — I440 Atrioventricular block, first degree: Secondary | ICD-10-CM | POA: Diagnosis not present

## 2019-04-19 DIAGNOSIS — I4891 Unspecified atrial fibrillation: Secondary | ICD-10-CM | POA: Diagnosis not present

## 2019-04-19 DIAGNOSIS — Z7901 Long term (current) use of anticoagulants: Secondary | ICD-10-CM | POA: Insufficient documentation

## 2019-04-19 DIAGNOSIS — Z1211 Encounter for screening for malignant neoplasm of colon: Secondary | ICD-10-CM | POA: Diagnosis present

## 2019-04-19 DIAGNOSIS — Z86010 Personal history of colonic polyps: Secondary | ICD-10-CM | POA: Diagnosis not present

## 2019-04-19 DIAGNOSIS — K5730 Diverticulosis of large intestine without perforation or abscess without bleeding: Secondary | ICD-10-CM | POA: Diagnosis not present

## 2019-04-19 DIAGNOSIS — Z8673 Personal history of transient ischemic attack (TIA), and cerebral infarction without residual deficits: Secondary | ICD-10-CM | POA: Insufficient documentation

## 2019-04-19 DIAGNOSIS — Z8249 Family history of ischemic heart disease and other diseases of the circulatory system: Secondary | ICD-10-CM | POA: Diagnosis not present

## 2019-04-19 DIAGNOSIS — Z809 Family history of malignant neoplasm, unspecified: Secondary | ICD-10-CM | POA: Insufficient documentation

## 2019-04-19 DIAGNOSIS — I10 Essential (primary) hypertension: Secondary | ICD-10-CM | POA: Insufficient documentation

## 2019-04-19 DIAGNOSIS — Z823 Family history of stroke: Secondary | ICD-10-CM | POA: Diagnosis not present

## 2019-04-19 HISTORY — PX: COLONOSCOPY: SHX5424

## 2019-04-19 SURGERY — COLONOSCOPY
Anesthesia: Moderate Sedation

## 2019-04-19 MED ORDER — ONDANSETRON HCL 4 MG/2ML IJ SOLN
INTRAMUSCULAR | Status: AC
  Filled 2019-04-19: qty 2

## 2019-04-19 MED ORDER — MEPERIDINE HCL 50 MG/ML IJ SOLN
INTRAMUSCULAR | Status: AC
  Filled 2019-04-19: qty 1

## 2019-04-19 MED ORDER — STERILE WATER FOR IRRIGATION IR SOLN
Status: DC | PRN
  Administered 2019-04-19: 14:00:00 4 mL

## 2019-04-19 MED ORDER — SODIUM CHLORIDE 0.9 % IV SOLN
INTRAVENOUS | Status: DC
  Administered 2019-04-19: 13:00:00 via INTRAVENOUS

## 2019-04-19 MED ORDER — MEPERIDINE HCL 100 MG/ML IJ SOLN
INTRAMUSCULAR | Status: DC | PRN
  Administered 2019-04-19: 25 mg via INTRAVENOUS
  Administered 2019-04-19: 10 mg via INTRAVENOUS

## 2019-04-19 MED ORDER — MIDAZOLAM HCL 5 MG/5ML IJ SOLN
INTRAMUSCULAR | Status: AC
  Filled 2019-04-19: qty 10

## 2019-04-19 MED ORDER — ONDANSETRON HCL 4 MG/2ML IJ SOLN
INTRAMUSCULAR | Status: DC | PRN
  Administered 2019-04-19: 4 mg via INTRAVENOUS

## 2019-04-19 MED ORDER — MIDAZOLAM HCL 5 MG/5ML IJ SOLN
INTRAMUSCULAR | Status: DC | PRN
  Administered 2019-04-19: 1 mg via INTRAVENOUS
  Administered 2019-04-19: 2 mg via INTRAVENOUS

## 2019-04-19 NOTE — H&P (Signed)
@LOGO @   Primary Care Physician:  Lemmie Evens, MD Primary Gastroenterologist:  Dr. Gala Romney   Pre-Procedure History & Physical: HPI:  Joseph [name] is a 73 y.o. male here for surveillance colonoscopy.  History of colonic adenoma removed 7 years ago.  No GI symptoms currently.  Eliquis held 3 days ago.  Past Medical History:  Diagnosis Date  . 1st degree AV block   . Diastolic dysfunction 123456   grade 1  . Hypertension   . New onset atrial fibrillation ([place]) 06/2015  . RBBB   . Sinus bradycardia   . Stroke [facility])    no defecits, pt denies having a stroke, says [name] diagnosed this    Past Surgical History:  Procedure Laterality Date  . COLONOSCOPY    . COLONOSCOPY  12/30/2011   Procedure: COLONOSCOPY;  Surgeon: [name], MD;  [facility];  Service: Endoscopy;  Laterality: N/A;  1:45PM  . EXCISION MASS LOWER EXTREMETIES Left 05/12/2018   Procedure: Left foot plantar fibroma excision;  Surgeon: [name], MD;  Location: [place];  Service: Orthopedics;  Laterality: Left;    Prior to Admission medications   Medication Sig Start Date End Date Taking? Authorizing Provider  amLODipine (NORVASC) 10 MG tablet Take 1 tablet (10 mg total) by mouth daily. Patient taking differently: Take 10 mg by mouth every evening.  03/30/19  Yes Troy Sine, MD  apixaban (ELIQUIS) 5 MG TABS tablet Take 1 tablet (5 mg total) by mouth 2 (two) times daily. 07/18/15  Yes Brett Canales, PA-C  cholecalciferol (VITAMIN D) 25 MCG (1000 UT) tablet Take 1,000 Units by mouth daily.   Yes [provider]  Coenzyme Q10 (COQ10) 200 MG CAPS Take 200 mg by mouth daily.   Yes [provider]  doxazosin (CARDURA) 8 MG tablet Take 8 mg by mouth at bedtime.    Yes [provider]  irbesartan (AVAPRO) 300 MG tablet TAKE 1 TABLET BY MOUTH DAILY Patient taking differently: Take 300 mg by mouth daily.  12/30/18  Yes Troy Sine, MD  minoxidil  (LONITEN) 2.5 MG tablet Take 5 mg by mouth daily.    Yes [provider]  Misc Natural Products (PROSTATE HEALTH PO) Take 1 capsule by mouth daily. Super Beta Prostate   Yes [provider]  Multiple Vitamin (MULTIVITAMIN WITH MINERALS) TABS tablet Take 1 tablet by mouth daily.   Yes [provider]  Polyethyl Glycol-Propyl Glycol (LUBRICANT EYE DROPS) 0.4-0.3 % SOLN Place 1 drop into both eyes 3 (three) times daily as needed (dry/irritated eyes.).   Yes [provider]  polyethylene glycol-electrolytes (TRILYTE) 420 g solution Take 4,000 mLs by mouth as directed. 02/28/19  Yes Rourk, Cristopher Estimable, MD  saw palmetto 500 MG capsule Take 500 mg by mouth every evening.   Yes [provider]  spironolactone (ALDACTONE) 25 MG tablet Take 0.5 tablets (12.5 mg total) by mouth daily. 08/09/18  Yes Troy Sine, MD  Turmeric 500 MG CAPS Take 500 mg by mouth daily.   Yes [provider]    Allergies as of 02/28/2019  . (No Known Allergies)    Family History  Problem Relation Age of Onset  . Cancer Brother   . CVA Mother   . Hypertension Father   . Colon cancer Neg Hx     Social History   Socioeconomic History  . Marital status: Married    Spouse name: Not on file  .  Number of children: Not on file  . Years of education: Not on file  . Highest education level: Not on file  Occupational History  . Not on file  Social Needs  . Financial resource strain: Not on file  . Food insecurity    Worry: Not on file    Inability: Not on file  . Transportation needs    Medical: Not on file    Non-medical: Not on file  Tobacco Use  . Smoking status: Former Smoker    Packs/day: 0.00    Years: 4.00    Pack years: 0.00  . Smokeless tobacco: Never Used  . Tobacco comment: quit about 40 yrs ago  Substance and Sexual Activity  . Alcohol use: Yes    Alcohol/week: 0.0 standard drinks    Comment: 6 pack of beer or less in a week  . Drug use: No  .  Sexual activity: Not on file  Lifestyle  . Physical activity    Days per week: Not on file    Minutes per session: Not on file  . Stress: Not on file  Relationships  . Social Herbalist on phone: Not on file    Gets together: Not on file    Attends religious service: Not on file    Active member of club or organization: Not on file    Attends meetings of clubs or organizations: Not on file    Relationship status: Not on file  . Intimate partner violence    Fear of current or ex partner: Not on file    Emotionally abused: Not on file    Physically abused: Not on file    Forced sexual activity: Not on file  Other Topics Concern  . Not on file  Social History Narrative  . Not on file    Review of Systems: See HPI, otherwise negative ROS  Physical Exam: BP (!) 155/76   Pulse 66   Resp 12   Ht 5\' 9"  (1.753 m)   Wt 99.3 kg   SpO2 100%   BMI 32.34 kg/m  General:   Alert,  Well-developed, well-nourished, pleasant and cooperative in NAD Mouth:  No deformity or lesions. Neck:  Supple; no masses or thyromegaly. No significant cervical adenopathy. Lungs:  Clear throughout to auscultation.   No wheezes, crackles, or rhonchi. No acute distress. Heart:  Regular rate and rhythm; no murmurs, clicks, rubs,  or gallops. Abdomen: Non-distended, normal bowel sounds.  Soft and nontender without appreciable mass or hepatosplenomegaly.  Pulses:  Normal pulses noted. Extremities:  Without clubbing or edema.  Impression/Plan: 73 year old gentleman history colonic adenoma; here for surveillance colonoscopy.  Eliquis held 3 days ago per plan.  The risks, benefits, limitations, alternatives and imponderables have been reviewed with the patient. Questions have been answered. All parties are agreeable.      Notice: This dictation was prepared with Dragon dictation along with smaller phrase technology. Any transcriptional errors that result from this process are unintentional and may not  be corrected upon review.

## 2019-04-19 NOTE — Op Note (Signed)
[facility] Patient Name: Joseph [name] Procedure Date: 04/19/2019 12:43 PM MRN: [id number] Date of Birth: [birth year] Attending MD: Norvel Richards , MD CSN: [id number] Age: 73 Admit Type: Outpatient Procedure:                Colonoscopy Indications:              High risk colon cancer surveillance: Personal                            history of colonic polyps Providers:                Norvel Richards, MD, Hinton Rao, RN, Aram Candela Referring MD:              Medicines:                Midazolam 3 mg IV, Meperidine 35 mg IV Complications:            No immediate complications. Estimated Blood Loss:     Estimated blood loss: none. Procedure:                Pre-Anesthesia Assessment:                           - Prior to the procedure, a History and Physical                            was performed, and patient medications and                            allergies were reviewed. The patient's tolerance of                            previous anesthesia was also reviewed. The risks                            and benefits of the procedure and the sedation                            options and risks were discussed with the patient.                            All questions were answered, and informed consent                            was obtained. Prior Anticoagulants: The patient                            last took Eliquis (apixaban) 3 days prior to the                            procedure. ASA Grade Assessment: III - A patient  with severe systemic disease. After reviewing the                            risks and benefits, the patient was deemed in                            satisfactory condition to undergo the procedure.                           After obtaining informed consent, the colonoscope                            was passed under direct vision. Throughout the                            procedure, the  patient's blood pressure, pulse, and                            oxygen saturations were monitored continuously. The                            [id number] scope was introduced through                            the anus and advanced to the the cecum, identified                            by appendiceal orifice and ileocecal valve. The                            colonoscopy was performed without difficulty. The                            patient tolerated the procedure well. The quality                            of the bowel preparation was adequate. Scope In: 1:38:56 PM Scope Out: 1:52:49 PM Scope Withdrawal Time: 0 hours 6 minutes 46 seconds  Total Procedure Duration: 0 hours 13 minutes 53 seconds  Findings:      The perianal and digital rectal examinations were normal.      Many small and large-mouthed diverticula were found in the entire colon.      The exam was otherwise without abnormality on direct and retroflexion       views. Impression:               - Diverticulosis in the entire examined colon.                           - The examination was otherwise normal on direct                            and retroflexion views.                           -  No specimens collected. Moderate Sedation:      Moderate (conscious) sedation was administered by the endoscopy nurse       and supervised by the endoscopist. The following parameters were       monitored: oxygen saturation, heart rate, blood pressure, respiratory       rate, EKG, adequacy of pulmonary ventilation, and response to care.       Total physician intraservice time was 17 minutes. Recommendation:           - Patient has a contact number available for                            emergencies. The signs and symptoms of potential                            delayed complications were discussed with the                            patient. Return to normal activities tomorrow.                            Written discharge  instructions were provided to the                            patient.                           - Resume previous diet.                           - Continue present medications. Resume Eliquis today                           - No repeat colonoscopy due to age.                           - Return to GI clinic PRN. Procedure Code(s):        --- Professional ---                           [phone], Colonoscopy, flexible; diagnostic, including                            collection of specimen(s) by brushing or washing,                            when performed (separate procedure)                           G0500, Moderate sedation services provided by the                            same physician or other qualified health care                            professional performing a gastrointestinal  endoscopic service that sedation supports,                            requiring the presence of an independent trained                            observer to assist in the monitoring of the                            patient's level of consciousness and physiological                            status; initial 15 minutes of intra-service time;                            patient age 81 years or older (additional time may                            be reported with [phone], as appropriate) Diagnosis Code(s):        --- Professional ---                           Z86.010, Personal history of colonic polyps                           K57.30, Diverticulosis of large intestine without                            perforation or abscess without bleeding CPT copyright 2019 American Medical Association. All rights reserved. The codes documented in this report are preliminary and upon coder review may  be revised to meet current compliance requirements. Cristopher Estimable. Deborah Dondero, MD Norvel Richards, MD 04/19/2019 2:00:59 PM This report has been signed electronically. Number of Addenda: 0

## 2019-04-19 NOTE — Discharge Instructions (Signed)
Colonoscopy Discharge Instructions  Read the instructions outlined below and refer to this sheet in the next few weeks. These discharge instructions provide you with general information on caring for yourself after you leave the hospital. Your doctor may also give you specific instructions. While your treatment has been planned according to the most current medical practices available, unavoidable complications occasionally occur. If you have any problems or questions after discharge, call Dr. Gala Romney at [phone]. ACTIVITY  You may resume your regular activity, but move at a slower pace for the next 24 hours.   Take frequent rest periods for the next 24 hours.   Walking will help get rid of the air and reduce the bloated feeling in your belly (abdomen).   No driving for 24 hours (because of the medicine (anesthesia) used during the test).    Do not sign any important legal documents or operate any machinery for 24 hours (because of the anesthesia used during the test).  NUTRITION  Drink plenty of fluids.   You may resume your normal diet as instructed by your doctor.   Begin with a light meal and progress to your normal diet. Heavy or fried foods are harder to digest and may make you feel sick to your stomach (nauseated).   Avoid alcoholic beverages for 24 hours or as instructed.  MEDICATIONS  You may resume your normal medications unless your doctor tells you otherwise.  WHAT YOU CAN EXPECT TODAY  Some feelings of bloating in the abdomen.   Passage of more gas than usual.   Spotting of blood in your stool or on the toilet paper.  IF YOU HAD POLYPS REMOVED DURING THE COLONOSCOPY:  No aspirin products for 7 days or as instructed.   No alcohol for 7 days or as instructed.   Eat a soft diet for the next 24 hours.  FINDING OUT THE RESULTS OF YOUR TEST Not all test results are available during your visit. If your test results are not back during the visit, make an appointment  with your caregiver to find out the results. Do not assume everything is normal if you have not heard from your caregiver or the medical facility. It is important for you to follow up on all of your test results.  SEEK IMMEDIATE MEDICAL ATTENTION IF:  You have more than a spotting of blood in your stool.   Your belly is swollen (abdominal distention).   You are nauseated or vomiting.   You have a temperature over 101.   You have abdominal pain or discomfort that is severe or gets worse throughout the day.   Colon diverticulosis information provided  I do not recommend a future colonoscopy unless new symptoms develop  At patient's request, I called his wife, Zayde Laessig, at [phone], I discussed my findings and recommendation  Resume Eliquis today     Diverticulosis  Diverticulosis is a condition that develops when small pouches (diverticula) form in the wall of the large intestine (colon). The colon is where water is absorbed and stool is formed. The pouches form when the inside layer of the colon pushes through weak spots in the outer layers of the colon. You may have a few pouches or many of them. What are the causes? The cause of this condition is not known. What increases the risk? The following factors may make you more likely to develop this condition:  Being older than age 29. Your risk for this condition increases with age. Diverticulosis is rare among  people younger than age 57. By age 76, many people have it.  Eating a low-fiber diet.  Having frequent constipation.  Being overweight.  Not getting enough exercise.  Smoking.  Taking over-the-counter pain medicines, like aspirin and ibuprofen.  Having a family history of diverticulosis. What are the signs or symptoms? In most people, there are no symptoms of this condition. If you do have symptoms, they may include:  Bloating.  Cramps in the abdomen.  Constipation or diarrhea.  Pain in the lower  left side of the abdomen. How is this diagnosed? This condition is most often diagnosed during an exam for other colon problems. Because diverticulosis usually has no symptoms, it often cannot be diagnosed independently. This condition may be diagnosed by:  Using a flexible scope to examine the colon (colonoscopy).  Taking an X-ray of the colon after dye has been put into the colon (barium enema).  Doing a CT scan. How is this treated? You may not need treatment for this condition if you have never developed an infection related to diverticulosis. If you have had an infection before, treatment may include:  Eating a high-fiber diet. This may include eating more fruits, vegetables, and grains.  Taking a fiber supplement.  Taking a live bacteria supplement (probiotic).  Taking medicine to relax your colon.  Taking antibiotic medicines. Follow these instructions at home:  Drink 6-8 glasses of water or more each day to prevent constipation.  Try not to strain when you have a bowel movement.  If you have had an infection before: ? Eat more fiber as directed by your health care provider or your diet and nutrition specialist (dietitian). ? Take a fiber supplement or probiotic, if your health care provider approves.  Take over-the-counter and prescription medicines only as told by your health care provider.  If you were prescribed an antibiotic, take it as told by your health care provider. Do not stop taking the antibiotic even if you start to feel better.  Keep all follow-up visits as told by your health care provider. This is important. Contact a health care provider if:  You have pain in your abdomen.  You have bloating.  You have cramps.  You have not had a bowel movement in 3 days. Get help right away if:  Your pain gets worse.  Your bloating becomes very bad.  You have a fever or chills, and your symptoms suddenly get worse.  You vomit.  You have bowel movements  that are bloody or black.  You have bleeding from your rectum. Summary  Diverticulosis is a condition that develops when small pouches (diverticula) form in the wall of the large intestine (colon).  You may have a few pouches or many of them.  This condition is most often diagnosed during an exam for other colon problems.  If you have had an infection related to diverticulosis, treatment may include increasing the fiber in your diet, taking supplements, or taking medicines. This information is not intended to replace advice given to you by your health care provider. Make sure you discuss any questions you have with your health care provider. Document Released: 03/26/2004 Document Revised: 06/11/2017 Document Reviewed: 05/18/2016 Elsevier Patient Education  2020 [employer].

## 2019-04-26 ENCOUNTER — Encounter (HOSPITAL_COMMUNITY): Payer: Self-pay | Admitting: Internal Medicine

## 2019-05-15 ENCOUNTER — Other Ambulatory Visit: Payer: Self-pay

## 2019-05-15 ENCOUNTER — Ambulatory Visit (HOSPITAL_COMMUNITY)
Admission: RE | Admit: 2019-05-15 | Discharge: 2019-05-15 | Disposition: A | Discharge status: Home / Self Care | Payer: Medicare Other | Source: Ambulatory Visit | Attending: Nurse Practitioner | Admitting: Nurse Practitioner

## 2019-05-15 ENCOUNTER — Other Ambulatory Visit (HOSPITAL_COMMUNITY): Payer: Self-pay | Admitting: Nurse Practitioner

## 2019-05-15 DIAGNOSIS — M542 Cervicalgia: Secondary | ICD-10-CM | POA: Insufficient documentation

## 2019-05-15 DIAGNOSIS — M25511 Pain in right shoulder: Secondary | ICD-10-CM

## 2019-06-01 ENCOUNTER — Ambulatory Visit (HOSPITAL_COMMUNITY): Payer: Medicare Other

## 2019-06-01 ENCOUNTER — Encounter (HOSPITAL_COMMUNITY): Payer: Self-pay

## 2019-06-02 ENCOUNTER — Ambulatory Visit (HOSPITAL_COMMUNITY): Payer: Medicare Other | Attending: Nurse Practitioner

## 2019-06-02 ENCOUNTER — Encounter (HOSPITAL_COMMUNITY): Payer: Self-pay

## 2019-06-02 ENCOUNTER — Other Ambulatory Visit: Payer: Self-pay

## 2019-06-02 DIAGNOSIS — M25511 Pain in right shoulder: Secondary | ICD-10-CM | POA: Diagnosis present

## 2019-06-02 DIAGNOSIS — G8929 Other chronic pain: Secondary | ICD-10-CM | POA: Diagnosis present

## 2019-06-02 DIAGNOSIS — M25611 Stiffness of right shoulder, not elsewhere classified: Secondary | ICD-10-CM | POA: Insufficient documentation

## 2019-06-02 DIAGNOSIS — R29898 Other symptoms and signs involving the musculoskeletal system: Secondary | ICD-10-CM | POA: Diagnosis present

## 2019-06-02 NOTE — Patient Instructions (Signed)
Complete the following exercises 2-3 times a day.  Doorway Stretch  Place each hand opposite each other on the doorway. (You can change where you feel the stretch by moving arms higher or lower.) Step through with one foot and bend front knee until a stretch is felt and hold. Step through with the opposite foot on the next rep. Hold for __15-20___ seconds. Repeat __2__times.     Scapular Retraction (Standing)   With arms at sides, pinch shoulder blades together. Repeat __10__ times per set. Do __1__ sets per session. Do __2__ sessions per day.  http://orth.exer.us/944   Copyright  VHI. All rights reserved.   Internal Rotation Across Back  Grab the end of a towel with your affected side, palm facing backwards. Grab the towel with your unaffected side and pull your affected hand across your back until you feel a stretch in the front of your shoulder. If you feel pain, pull just to the pain, do not pull through the pain. Hold. Return your affected arm to your side. Try to keep your hand/arm close to your body during the entire movement.     Hold for 15-20 seconds. Complete 2 times.        Posterior Capsule Stretch   Stand or sit, one arm across body so hand rests over opposite shoulder. Gently push on crossed elbow with other hand until stretch is felt in shoulder of crossed arm. Hold _15-20__ seconds.  Repeat _2__ times per session. Do ___ sessions per day.   Wall Flexion  Slide your arm up the wall or door frame until a stretch is felt in your shoulder . Hold for 15-20 seconds. Complete 2 times     Shoulder Abduction Stretch  Stand side ways by a wall with affected up on wall. Gently step in toward wall to feel stretch. Hold for 15-20 seconds. Complete 2 times.

## 2019-06-05 NOTE — Therapy (Signed)
[facility] [address] [place], [place], [address] Phone: [phone]   Fax:  [phone]  Occupational Therapy Evaluation  Patient Details  Name: Joseph [name] MRN: [id number] Date of Birth: 02-07-1946 Referring Provider (OT): Carlis Abbott, NP   Encounter Date: 06/02/2019  OT End of Session - 06/05/19 0942    Visit Number  1    Number of Visits  8    Date for OT Re-Evaluation  06/30/19    Authorization Type  Double coverage: [place] No visit limit or authorization needed.    Authorization Time Period  Complete progress note on visit 10    Authorization - Visit Number  1    Authorization - Number of Visits  10    OT Start Time  [phone]    OT Stop Time  1026    OT Time Calculation (min)  39 min    Activity Tolerance  Patient tolerated treatment well    Behavior During Therapy  WFL for tasks assessed/performed       Past Medical History:  Diagnosis Date  . 1st degree AV block   . Diastolic dysfunction 123456   grade 1  . Hypertension   . New onset atrial fibrillation ([place]) 06/2015  . RBBB   . Sinus bradycardia   . Stroke [facility])    no defecits, pt denies having a stroke, says [name] diagnosed this    Past Surgical History:  Procedure Laterality Date  . COLONOSCOPY    . COLONOSCOPY  12/30/2011   Procedure: COLONOSCOPY;  Surgeon: [name], MD;  [facility];  Service: Endoscopy;  Laterality: N/A;  1:45PM  . COLONOSCOPY N/A 04/19/2019   Procedure: COLONOSCOPY;  Surgeon: [name], MD;  [facility];  Service: Endoscopy;  Laterality: N/A;  2:00pm  . EXCISION MASS LOWER EXTREMETIES Left 05/12/2018   Procedure: Left foot plantar fibroma excision;  Surgeon: [name], MD;  Location: [facility];  Service: Orthopedics;  Laterality: Left;    There were no vitals filed for this visit.      06/02/19 0955  Assessment  Medical Diagnosis right shoulder  pain  Onset Date/Surgical Date  (Started around 6 months ago)  Hand Dominance Right  Next MD Visit  (in 3 months)  Prior Therapy None for this condition  Precautions  Precautions None  Restrictions  Weight Bearing Restrictions No  Balance Screen  Has the patient fallen in the past 6 months No  Home  Environment  Family/patient expects to be discharged to: Private residence  Living Arrangements Spouse/significant other  Prior Function  Level of [place] Retired  ADL  ADL comments Difficulty reaching above shoulder level.   Mobility  Mobility Status Independent  Written Expression  Dominant Hand Right  Vision - History  Baseline Vision No visual deficits  Cognition  Overall Cognitive Status Within Functional Limits for tasks assessed  Observation/Other Assessments  Focus on Therapeutic Outcomes (FOTO)  Complete at next session  ROM / Strength  AROM / PROM / Strength PROM;Strength;AROM  Palpation  Palpation comment Moderate fascial restrictions in the right upper arm, trapezius, and scapularis region.  AROM  AROM Assessment Site Shoulder  Overall AROM Comments Assessed seated. IR/er adducted  Right/Left Shoulder Right  Right Shoulder Flexion 125 Degrees  Right Shoulder ABduction 135 Degrees  Right Shoulder Internal Rotation 90 Degrees  Right Shoulder External Rotation 55 Degrees  PROM  Overall  PROM Comments Assessed supine. IR/er adducted  PROM Assessment Site Shoulder  Right/Left Shoulder Right  Right Shoulder Flexion 142 Degrees  Right Shoulder ABduction 111 Degrees  Right Shoulder Internal Rotation 90 Degrees  Right Shoulder External Rotation 45 Degrees  Strength  Overall Strength Comments Assessed seated. IR/er adducted  Strength Assessment Site Shoulder  Right/Left Shoulder Right  Right Shoulder Flexion 4-/5  Right Shoulder ABduction 4-/5  Right Shoulder Internal Rotation 5/5  Right Shoulder External Rotation 4+/5                      OT Education - 06/05/19 0942    Education Details  shoulder stretches    Person(s) Educated  Patient    Methods  Explanation;Demonstration;Handout;Verbal cues    Comprehension  Verbalized understanding       OT Short Term Goals - 06/05/19 0946      OT SHORT TERM GOAL #1   Title  Patient will be educated and independent with HEP in order to faciliate his progress in therapy and allow him to return to using his right UE more functionally and with less pain when reaching.    Time  1    Period  Weeks    Status  New    Target Date  06/30/19      OT SHORT TERM GOAL #2   Title  Patient will increase RUE A/ROM to [facility] in order to increase the ability to reach above shoulder for items as needed.    Time  4    Period  Weeks    Status  New      OT SHORT TERM GOAL #3   Title  Patient will increase RUE shoulder strength to 5/5 to allow him greater stability in the shoulder and scapular region when lifting and carrying items.    Time  4    Period  Weeks    Status  New      OT SHORT TERM GOAL #4   Title  Patient will decrease fascial restrictions to trace amount or less in order to increase functional mobility needed to complete reaching tasks using his RUE.    Time  4    Period  Weeks    Status  New      OT SHORT TERM GOAL #5   Title  Patient will report a pain level of no greater than 2/10 when completing basic daily tasks using his RUE.    Time  4    Period  Weeks    Status  New               Plan - 06/05/19 0944    Clinical Impression Statement  A: Patient is a 73 y/o male S/P right chronic shoulder pain causing increased fascial restrictions, pain, and decreased ROM and strength resulting in difficulty completing daily tasks using his Right UE for basic daily tasks.    OT Occupational Profile and History  Problem Focused Assessment - Including review of records relating to presenting problem    Occupational performance deficits  (Please refer to evaluation for details):  ADL's;Rest and Sleep;IADL's    Body Structure / Function / Physical Skills  ADL;UE functional use;Fascial restriction;Pain;ROM;Decreased knowledge of use of DME;Strength    Rehab Potential  Excellent    Clinical Decision Making  Limited treatment options, no task modification necessary    Comorbidities Affecting Occupational Performance:  May have comorbidities impacting occupational performance    Modification or Assistance to  Complete Evaluation   No modification of tasks or assist necessary to complete eval    OT Frequency  2x / week    OT Duration  4 weeks    OT Treatment/Interventions  Self-care/ADL training;Ultrasound;DME and/or AE instruction;Patient/family education;Passive range of motion;Cryotherapy;Electrical Stimulation;Moist Heat;Neuromuscular education;Therapeutic exercise;Manual Therapy;Therapeutic activities    Plan  P: Patient will benefit from skilled OT services to increase functional performance and allow him to use hir RUE with less difficulty during ADL tasks. Treatment Plan: Myofascial release, manual stretching, AA/ROM, A/ROM, general shoulder and scapular strengthening. Next session: Complete FOTO.    Consulted and Agree with Plan of Care  Patient       Patient will benefit from skilled therapeutic intervention in order to improve the following deficits and impairments:   Body Structure / Function / Physical Skills: ADL, UE functional use, Fascial restriction, Pain, ROM, Decreased knowledge of use of DME, Strength       Visit Diagnosis: Chronic right shoulder pain - Plan: Ot plan of care cert/re-cert  Stiffness of right shoulder, not elsewhere classified - Plan: Ot plan of care cert/re-cert  Other symptoms and signs involving the musculoskeletal system - Plan: Ot plan of care cert/re-cert    Problem List Patient Active Problem List   Diagnosis Date Noted  . History of adenomatous polyp of colon 02/27/2019  .  Paroxysmal atrial fibrillation ([place]) 07/18/2015  . Bradycardia 06/29/2015  . Near syncope   . Atrial fibrillation with rapid ventricular response ([place]) 06/27/2015  . Left ventricular diastolic dysfunction, NYHA class 3 10/26/2014  . Kidney cysts 04/04/2013  . HTN (hypertension) 03/31/2013  .  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 Diastolic dysfunction- grade 2- echo 06/28/15 03/31/2013  . RBBB 03/31/2013  . AV block, 1st degree 03/31/2013  . Sinus bradycardia 03/31/2013  . Hematochezia 12/29/2011   Ailene Ravel, OTR/L,CBIS  [phone]  06/05/2019, 9:55 AM  [facility] [address] [place], [place], [address] Phone: [phone]   Fax:  [phone]  Name: Joseph [name] MRN: [id number] Date of Birth: [birth year]

## 2019-06-06 ENCOUNTER — Encounter (HOSPITAL_COMMUNITY): Payer: Self-pay

## 2019-06-06 ENCOUNTER — Other Ambulatory Visit: Payer: Self-pay

## 2019-06-06 ENCOUNTER — Ambulatory Visit (HOSPITAL_COMMUNITY): Payer: Medicare Other

## 2019-06-06 DIAGNOSIS — M25511 Pain in right shoulder: Secondary | ICD-10-CM | POA: Diagnosis not present

## 2019-06-06 DIAGNOSIS — M25611 Stiffness of right shoulder, not elsewhere classified: Secondary | ICD-10-CM

## 2019-06-06 DIAGNOSIS — R29898 Other symptoms and signs involving the musculoskeletal system: Secondary | ICD-10-CM

## 2019-06-06 DIAGNOSIS — G8929 Other chronic pain: Secondary | ICD-10-CM

## 2019-06-06 NOTE — Therapy (Signed)
[facility] [place], [place], [address] Phone: [phone]   Fax:  [phone]  Occupational Therapy Treatment  Patient Details  Name: Joseph [name] MRN: [id number] Date of Birth: 04-06-1946 Referring Provider (OT): Carlis Abbott, NP   Encounter Date: 06/06/2019  OT End of Session - 06/06/19 1243    Visit Number  2    Number of Visits  8    Date for OT Re-Evaluation  06/30/19    Authorization Type  Double coverage: [place] No visit limit or authorization needed.    Authorization Time Period  Complete progress note on visit 10    Authorization - Visit Number  2    Authorization - Number of Visits  10    OT Start Time  0957   Pt arrived late   OT Stop Time  1028    OT Time Calculation (min)  31 min    Activity Tolerance  Patient tolerated treatment well    Behavior During Therapy  WFL for tasks assessed/performed       Past Medical History:  Diagnosis Date  . 1st degree AV block   . Diastolic dysfunction 123456   grade 1  . Hypertension   . New onset atrial fibrillation ([place]) 06/2015  . RBBB   . Sinus bradycardia   . Stroke [facility])    no defecits, pt denies having a stroke, says [name] diagnosed this    Past Surgical History:  Procedure Laterality Date  . COLONOSCOPY    . COLONOSCOPY  12/30/2011   Procedure: COLONOSCOPY;  Surgeon: [name], MD;  [facility];  Service: Endoscopy;  Laterality: N/A;  1:45PM  . COLONOSCOPY N/A 04/19/2019   Procedure: COLONOSCOPY;  Surgeon: [name], MD;  [facility];  Service: Endoscopy;  Laterality: N/A;  2:00pm  . EXCISION MASS LOWER EXTREMETIES Left 05/12/2018   Procedure: Left foot plantar fibroma excision;  Surgeon: [name], MD;  Location: [facility];  Service: Orthopedics;  Laterality: Left;    There were no vitals filed for this visit.  Subjective Assessment - 06/06/19 1226    Subjective   S: What exactly am I being treated for?    Currently in Pain?  No/denies         [facility] OT Assessment - 06/06/19 1227      Assessment   Medical Diagnosis  right shoulder pain      Precautions   Precautions  None      Observation/Other Assessments   Focus on Therapeutic Outcomes (FOTO)   70/100               OT Treatments/Exercises (OP) - 06/06/19 1228      Exercises   Exercises  Shoulder      Shoulder Exercises: Supine   Protraction  PROM;5 reps    Horizontal ABduction  PROM;5 reps;AAROM;10 reps    External Rotation  PROM;5 reps    Internal Rotation  PROM;5 reps    Flexion  PROM;5 reps;AAROM;10 reps    ABduction  PROM;5 reps      Manual Therapy   Manual Therapy  Myofascial release    Manual therapy comments  Manual techniques completed prior to exercises.     Myofascial Release  Myofascial release and manual stretching completed to right upper arm, trapezius, and scapularis region.              OT Education -  06/06/19 1235    Education Details  Completed FOTO, reviewed therapy goals. Discussed plan of care and basis of treatment focus.    Person(s) Educated  Patient    Methods  Explanation    Comprehension  Verbalized understanding       OT Short Term Goals - 06/06/19 1251      OT SHORT TERM GOAL #1   Title  Patient will be educated and independent with HEP in order to faciliate his progress in therapy and allow him to return to using his right UE more functionally and with less pain when reaching.    Time  1    Period  Weeks    Status  On-going    Target Date  06/30/19      OT SHORT TERM GOAL #2   Title  Patient will increase RUE A/ROM to [facility] in order to increase the ability to reach above shoulder for items as needed.    Time  4    Period  Weeks    Status  On-going      OT SHORT TERM GOAL #3   Title  Patient will increase RUE shoulder strength to 5/5 to allow him greater stability in the shoulder and scapular region when  lifting and carrying items.    Time  4    Period  Weeks    Status  On-going      OT SHORT TERM GOAL #4   Title  Patient will decrease fascial restrictions to trace amount or less in order to increase functional mobility needed to complete reaching tasks using his RUE.    Time  4    Period  Weeks    Status  On-going      OT SHORT TERM GOAL #5   Title  Patient will report a pain level of no greater than 2/10 when completing basic daily tasks using his RUE.    Time  4    Period  Weeks    Status  On-going               Plan - 06/06/19 1243    Clinical Impression Statement  A: Completed FOTO, reviewed goals and discussed focus of treatment. Inititated myofascial release and passive stretching. Able to complete some A/ROM shoulder exercises although unable to complete all due to time constraint. Manual techniques completed to address fascial restrictions. VC for form and technique were provided.    Body Structure / Function / Physical Skills  ADL;UE functional use;Fascial restriction;Pain;ROM;Decreased knowledge of use of DME;Strength    Plan  P: Remind patient to count repetitions unless you do it. Complete A/ROM supine and standing. Update HEP if needed.    Consulted and Agree with Plan of Care  Patient       Patient will benefit from skilled therapeutic intervention in order to improve the following deficits and impairments:   Body Structure / Function / Physical Skills: ADL, UE functional use, Fascial restriction, Pain, ROM, Decreased knowledge of use of DME, Strength       Visit Diagnosis: Stiffness of right shoulder, not elsewhere classified  Other symptoms and signs involving the musculoskeletal system  Chronic right shoulder pain    Problem List Patient Active Problem List   Diagnosis Date Noted  . History of adenomatous polyp of colon 02/27/2019  . Paroxysmal atrial fibrillation ([place]) 07/18/2015  . Bradycardia 06/29/2015  . Near syncope   . Atrial  fibrillation with rapid ventricular response ([place]) 06/27/2015  . Left  ventricular diastolic dysfunction, NYHA class 3 10/26/2014  . Kidney cysts 04/04/2013  . HTN (hypertension) 03/31/2013  .  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 Diastolic dysfunction- grade 2- echo 06/28/15 03/31/2013  . RBBB 03/31/2013  . AV block, 1st degree 03/31/2013  . Sinus bradycardia 03/31/2013  . Hematochezia 12/29/2011   Ailene Ravel, OTR/L,CBIS  [phone]  06/06/2019, 12:51 PM  [facility] [address] [place], [place], [address] Phone: [phone]   Fax:  [phone]  Name: Joseph [name] MRN: [id number] Date of Birth: [birth year]

## 2019-06-13 ENCOUNTER — Encounter (HOSPITAL_COMMUNITY): Payer: Self-pay | Admitting: Specialist

## 2019-06-13 ENCOUNTER — Other Ambulatory Visit: Payer: Self-pay

## 2019-06-13 ENCOUNTER — Ambulatory Visit (HOSPITAL_COMMUNITY): Payer: Medicare Other | Attending: Nurse Practitioner | Admitting: Specialist

## 2019-06-13 DIAGNOSIS — M25511 Pain in right shoulder: Secondary | ICD-10-CM | POA: Insufficient documentation

## 2019-06-13 DIAGNOSIS — G8929 Other chronic pain: Secondary | ICD-10-CM | POA: Diagnosis present

## 2019-06-13 DIAGNOSIS — R29898 Other symptoms and signs involving the musculoskeletal system: Secondary | ICD-10-CM

## 2019-06-13 DIAGNOSIS — M25611 Stiffness of right shoulder, not elsewhere classified: Secondary | ICD-10-CM | POA: Insufficient documentation

## 2019-06-13 NOTE — Therapy (Signed)
[place], [place], [address] Phone: [phone]   Fax:  [phone]  Occupational Therapy Treatment  Patient Details  Name: Joseph [name] MRN: [id number] Date of Birth: 1946/04/15 Referring Provider (OT): Carlis Abbott, NP   Encounter Date: 06/13/2019  OT End of Session - 06/13/19 1127    Visit Number  3    Number of Visits  8    Date for OT Re-Evaluation  06/30/19    Authorization Type  Double coverage: [place] No visit limit or authorization needed.    Authorization Time Period  Complete progress note on visit 10    Authorization - Visit Number  3    Authorization - Number of Visits  10    OT Start Time  [phone]    OT Stop Time  0950    OT Time Calculation (min)  45 min    Activity Tolerance  Patient tolerated treatment well    Behavior During Therapy  [facility] for tasks assessed/performed       Past Medical History:  Diagnosis Date  . 1st degree AV block   . Diastolic dysfunction 123456   grade 1  . Hypertension   . New onset atrial fibrillation ([place]) 06/2015  . RBBB   . Sinus bradycardia   . Stroke [facility])    no defecits, pt denies having a stroke, says [name] diagnosed this    Past Surgical History:  Procedure Laterality Date  . COLONOSCOPY    . COLONOSCOPY  12/30/2011   Procedure: COLONOSCOPY;  Surgeon: [name], MD;  [facility];  Service: Endoscopy;  Laterality: N/A;  1:45PM  . COLONOSCOPY N/A 04/19/2019   Procedure: COLONOSCOPY;  Surgeon: [name], MD;  [facility];  Service: Endoscopy;  Laterality: N/A;  2:00pm  . EXCISION MASS LOWER EXTREMETIES Left 05/12/2018   Procedure: Left foot plantar fibroma excision;  Surgeon: [name], MD;  Location: [place];  Service: Orthopedics;  Laterality: Left;    There were no vitals filed for this visit.  Subjective Assessment - 06/13/19 1106    Subjective   S:  I am not  sure what caused my shoulder to hurt, I thought it was a tick bite.    Currently in Pain?  Yes    Pain Score  3     Pain Location  Shoulder    Pain Orientation  Right;Anterior;Other (Comment)   subscapular region   Pain Descriptors / Indicators  Aching    Pain Type  Acute pain         [facility] OT Assessment - 06/13/19 0001      Assessment   Medical Diagnosis  right shoulder pain      Precautions   Precautions  None               OT Treatments/Exercises (OP) - 06/13/19 0001      Exercises   Exercises  Shoulder      Shoulder Exercises: Supine   Protraction  PROM;5 reps;AAROM;12 reps    Horizontal ABduction  PROM;5 reps;AAROM;12 reps    External Rotation  PROM;5 reps;AAROM;12 reps    Internal Rotation  PROM;5 reps;AAROM;12 reps    Flexion  PROM;5 reps;AAROM;12 reps    ABduction  PROM;5 reps;AAROM;12 reps      Shoulder Exercises: Seated   Elevation  AROM;10 reps    Extension  AROM;10 reps    Extension Limitations  tactile cuing for upright posture    Row  AROM;10 reps    Row Limitations  tactile cuing for upright posture       Shoulder Exercises: Therapy Ball   Flexion  10 reps    ABduction  10 reps      Manual Therapy   Manual Therapy  Myofascial release    Manual therapy comments  Manual techniques completed prior to exercises.     Myofascial Release  Myofascial release and manual stretching completed to right upper arm, trapezius, and scapularis region.              OT Education - 06/13/19 1125    Education Details  Patient had several questions regarding his pain, possible causes, and techniques to use to relieve pain.  Therapist educated patient on fascia, posture, tension as possible causes of pain and tightness in right shoulder region, and methods to decease pain, such as postural exercises, heat, stretches.    Person(s) Educated  Patient    Methods  Explanation    Comprehension  Verbalized understanding       OT Short Term Goals - 06/13/19  1130      OT SHORT TERM GOAL #1   Title  Patient will be educated and independent with HEP in order to faciliate his progress in therapy and allow him to return to using his right UE more functionally and with less pain when reaching.    Time  1    Period  Weeks    Status  On-going    Target Date  06/30/19      OT SHORT TERM GOAL #2   Title  Patient will increase RUE A/ROM to [facility] in order to increase the ability to reach above shoulder for items as needed.    Time  4    Period  Weeks    Status  On-going      OT SHORT TERM GOAL #3   Title  Patient will increase RUE shoulder strength to 5/5 to allow him greater stability in the shoulder and scapular region when lifting and carrying items.    Time  4    Period  Weeks    Status  On-going      OT SHORT TERM GOAL #4   Title  Patient will decrease fascial restrictions to trace amount or less in order to increase functional mobility needed to complete reaching tasks using his RUE.    Time  4    Period  Weeks    Status  On-going      OT SHORT TERM GOAL #5   Title  Patient will report a pain level of no greater than 2/10 when completing basic daily tasks using his RUE.    Time  4    Period  Weeks    Status  On-going               Plan - 06/13/19 1128    Clinical Impression Statement  A:  Patient presenting with pain in anterior shoulder as well as axillary area (referring to subscapularis region), with tightness and fascial restrictions noted in supraspinatus and upper trapezius region.  Patient improved accuracy with counting of repetitions of exercise this date.  Able to tolerate P/ROM and AA/ROM through 80% range this date in supine.    Body Structure / Function / Physical Skills  ADL;UE functional use;Fascial restriction;Pain;ROM;Decreased knowledge of use of DME;Strength    Plan  P:  Attempt AA/ROM in standing, add  A/ROM in supine, add proximal shoulder strengthening in supine and seated, as well as exercises to improve  posture and shoulder alignment.    Consulted and Agree with Plan of Care  Patient       Patient will benefit from skilled therapeutic intervention in order to improve the following deficits and impairments:   Body Structure / Function / Physical Skills: ADL, UE functional use, Fascial restriction, Pain, ROM, Decreased knowledge of use of DME, Strength       Visit Diagnosis: Stiffness of right shoulder, not elsewhere classified  Other symptoms and signs involving the musculoskeletal system  Chronic right shoulder pain    Problem List Patient Active Problem List   Diagnosis Date Noted  . History of adenomatous polyp of colon 02/27/2019  . Paroxysmal atrial fibrillation ([place]) 07/18/2015  . Bradycardia 06/29/2015  . Near syncope   . Atrial fibrillation with rapid ventricular response ([place]) 06/27/2015  . Left ventricular diastolic dysfunction, NYHA class 3 10/26/2014  . Kidney cysts 04/04/2013  . HTN (hypertension) 03/31/2013  .  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 Diastolic dysfunction- grade 2- echo 06/28/15 03/31/2013  . RBBB 03/31/2013  . AV block, 1st degree 03/31/2013  . Sinus bradycardia 03/31/2013  . Hematochezia 12/29/2011    Vangie Bicker, [place], OTR/L [phone]  06/13/2019, 11:43 AM  [place], [place], [address] Phone: [phone]   Fax:  [phone]  Name: Joseph [name] MRN: [id number] Date of Birth: [birth year]

## 2019-06-15 ENCOUNTER — Ambulatory Visit (HOSPITAL_COMMUNITY): Payer: Medicare Other

## 2019-06-15 ENCOUNTER — Encounter (HOSPITAL_COMMUNITY): Payer: Self-pay

## 2019-06-15 ENCOUNTER — Other Ambulatory Visit: Payer: Self-pay

## 2019-06-15 DIAGNOSIS — R29898 Other symptoms and signs involving the musculoskeletal system: Secondary | ICD-10-CM

## 2019-06-15 DIAGNOSIS — M25611 Stiffness of right shoulder, not elsewhere classified: Secondary | ICD-10-CM | POA: Diagnosis not present

## 2019-06-15 DIAGNOSIS — M25511 Pain in right shoulder: Secondary | ICD-10-CM

## 2019-06-15 DIAGNOSIS — G8929 Other chronic pain: Secondary | ICD-10-CM

## 2019-06-15 NOTE — Patient Instructions (Signed)

## 2019-06-15 NOTE — Therapy (Signed)
[place], [place], [address] Phone: [phone]   Fax:  [phone]  Occupational Therapy Treatment  Patient Details  Name: Joseph [name] MRN: [id number] Date of Birth: 24-Dec-1945 Referring Provider (OT): Carlis Abbott, NP   Encounter Date: 06/15/2019  OT End of Session - 06/15/19 1014    Visit Number  4    Number of Visits  8    Date for OT Re-Evaluation  06/30/19    Authorization Type  Double coverage: [place] No visit limit or authorization needed.    Authorization Time Period  Complete progress note on visit 10    Authorization - Visit Number  4    Authorization - Number of Visits  10    OT Start Time  [phone]   pt arrived late   OT Stop Time  0856    OT Time Calculation (min)  34 min    Activity Tolerance  Patient tolerated treatment well    Behavior During Therapy  [facility] for tasks assessed/performed       Past Medical History:  Diagnosis Date  . 1st degree AV block   . Diastolic dysfunction 123456   grade 1  . Hypertension   . New onset atrial fibrillation ([place]) 06/2015  . RBBB   . Sinus bradycardia   . Stroke [facility])    no defecits, pt denies having a stroke, says [name] diagnosed this    Past Surgical History:  Procedure Laterality Date  . COLONOSCOPY    . COLONOSCOPY  12/30/2011   Procedure: COLONOSCOPY;  Surgeon: [name], MD;  [facility];  Service: Endoscopy;  Laterality: N/A;  1:45PM  . COLONOSCOPY N/A 04/19/2019   Procedure: COLONOSCOPY;  Surgeon: [name], MD;  [facility];  Service: Endoscopy;  Laterality: N/A;  2:00pm  . EXCISION MASS LOWER EXTREMETIES Left 05/12/2018   Procedure: Left foot plantar fibroma excision;  Surgeon: [name], MD;  Location: [place];  Service: Orthopedics;  Laterality: Left;    There were no vitals filed for this visit.  Subjective Assessment - 06/15/19 0832    Subjective   S: The pain is gettting better but the ROM isn't changing yet.    Currently in Pain?  Yes    Pain Score  5     Pain Location  Shoulder    Pain Orientation  Right    Pain Descriptors / Indicators  Aching    Pain Type  Acute pain    Pain Radiating Towards  N/A    Pain Onset  More than a month ago    Pain Frequency  Constant    Aggravating Factors   increased use and movement    Pain Relieving Factors  positioning in comfortable position    Effect of Pain on Daily Activities  Pushes through the pain    Multiple Pain Sites  No         [facility] OT Assessment - 06/15/19 0833      Assessment   Medical Diagnosis  right shoulder pain      Precautions   Precautions  None               OT Treatments/Exercises (OP) - 06/15/19 0833      Exercises   Exercises  Shoulder      Shoulder Exercises: Supine   Protraction  PROM;5 reps;AROM;10 reps    Horizontal ABduction  PROM;5  reps;AROM;10 reps    External Rotation  PROM;5 reps;AROM;10 reps    Internal Rotation  PROM;5 reps;AROM;10 reps    Flexion  PROM;5 reps;AROM;10 reps    ABduction  PROM;5 reps;AROM;10 reps      Shoulder Exercises: Standing   Protraction  AAROM;12 reps    Horizontal ABduction  AAROM;12 reps    External Rotation  AAROM;12 reps    Internal Rotation  AAROM;12 reps    Flexion  AAROM;12 reps    ABduction  AAROM;12 reps             OT Education - 06/15/19 0845    Education Details  A/ROM shoulder exercises to complete supine.    Person(s) Educated  Patient    Methods  Explanation;Demonstration;Handout;Verbal cues    Comprehension  Verbalized understanding;Returned demonstration       OT Short Term Goals - 06/13/19 1130      OT SHORT TERM GOAL #1   Title  Patient will be educated and independent with HEP in order to faciliate his progress in therapy and allow him to return to using his right UE more functionally and with less pain when reaching.    Time  1    Period  Weeks    Status   On-going    Target Date  06/30/19      OT SHORT TERM GOAL #2   Title  Patient will increase RUE A/ROM to [facility] in order to increase the ability to reach above shoulder for items as needed.    Time  4    Period  Weeks    Status  On-going      OT SHORT TERM GOAL #3   Title  Patient will increase RUE shoulder strength to 5/5 to allow him greater stability in the shoulder and scapular region when lifting and carrying items.    Time  4    Period  Weeks    Status  On-going      OT SHORT TERM GOAL #4   Title  Patient will decrease fascial restrictions to trace amount or less in order to increase functional mobility needed to complete reaching tasks using his RUE.    Time  4    Period  Weeks    Status  On-going      OT SHORT TERM GOAL #5   Title  Patient will report a pain level of no greater than 2/10 when completing basic daily tasks using his RUE.    Time  4    Period  Weeks    Status  On-going               Plan - 06/15/19 1016    Clinical Impression Statement  A: Completed A/ROM supine and AA/ROM standing this session with VC for form and technique. Updated HEP to include A/ROM supine while progressing to standing. Manual techniques were not completed this date due to late arrival and time constraint. Patient continues to be limited with ROM although it is functional passively. He reports that his pain has decreased since starting therapy.    Body Structure / Function / Physical Skills  ADL;UE functional use;Fascial restriction;Pain;ROM;Decreased knowledge of use of DME;Strength    Plan  P: Progress towards completing A/ROM standing as able. PVC pipe slide and UBE bike.    Consulted and Agree with Plan of Care  Patient       Patient will benefit from skilled therapeutic intervention in order to improve the following deficits and  impairments:   Body Structure / Function / Physical Skills: ADL, UE functional use, Fascial restriction, Pain, ROM, Decreased knowledge of use of  DME, Strength       Visit Diagnosis: Chronic right shoulder pain  Other symptoms and signs involving the musculoskeletal system  Stiffness of right shoulder, not elsewhere classified    Problem List Patient Active Problem List   Diagnosis Date Noted  . History of adenomatous polyp of colon 02/27/2019  . Paroxysmal atrial fibrillation ([place]) 07/18/2015  . Bradycardia 06/29/2015  . Near syncope   . Atrial fibrillation with rapid ventricular response ([place]) 06/27/2015  . Left ventricular diastolic dysfunction, NYHA class 3 10/26/2014  . Kidney cysts 04/04/2013  . HTN (hypertension) 03/31/2013  .  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 Diastolic dysfunction- grade 2- echo 06/28/15 03/31/2013  . RBBB 03/31/2013  . AV block, 1st degree 03/31/2013  . Sinus bradycardia 03/31/2013  . Hematochezia 12/29/2011   Ailene Ravel, OTR/L,CBIS  [phone]  06/15/2019, 10:44 AM  [place] [address] [place], [place], [address] Phone: [phone]   Fax:  [phone]  Name: Joseph [name] MRN: [id number] Date of Birth: 11-03-1945

## 2019-06-17 ENCOUNTER — Other Ambulatory Visit: Payer: Self-pay | Admitting: Cardiovascular Disease

## 2019-06-20 ENCOUNTER — Ambulatory Visit (HOSPITAL_COMMUNITY): Payer: Medicare Other

## 2019-06-20 ENCOUNTER — Encounter (HOSPITAL_COMMUNITY): Payer: Self-pay

## 2019-06-20 ENCOUNTER — Other Ambulatory Visit: Payer: Self-pay

## 2019-06-20 DIAGNOSIS — G8929 Other chronic pain: Secondary | ICD-10-CM

## 2019-06-20 DIAGNOSIS — M25611 Stiffness of right shoulder, not elsewhere classified: Secondary | ICD-10-CM | POA: Diagnosis not present

## 2019-06-20 DIAGNOSIS — R29898 Other symptoms and signs involving the musculoskeletal system: Secondary | ICD-10-CM

## 2019-06-20 NOTE — Therapy (Signed)
[place], [place], [address] Phone: [phone]   Fax:  [phone]  Occupational Therapy Treatment  Patient Details  Name: Joseph [name] MRN: [id number] Date of Birth: 1946/06/27 Referring Provider (OT): Carlis Abbott, NP   Encounter Date: 06/20/2019  OT End of Session - 06/20/19 0928    Visit Number  5    Number of Visits  8    Date for OT Re-Evaluation  06/30/19    Authorization Type  Double coverage: [place] No visit limit or authorization needed.    Authorization Time Period  Complete progress note on visit 10    Authorization - Visit Number  5    Authorization - Number of Visits  10    OT Start Time  [phone]   Pt arrived late   OT Stop Time  0941    OT Time Calculation (min)  36 min    Activity Tolerance  Patient tolerated treatment well    Behavior During Therapy  [facility] for tasks assessed/performed       Past Medical History:  Diagnosis Date  . 1st degree AV block   . Diastolic dysfunction 123456   grade 1  . Hypertension   . New onset atrial fibrillation ([place]) 06/2015  . RBBB   . Sinus bradycardia   . Stroke [facility])    no defecits, pt denies having a stroke, says [name] diagnosed this    Past Surgical History:  Procedure Laterality Date  . COLONOSCOPY    . COLONOSCOPY  12/30/2011   Procedure: COLONOSCOPY;  Surgeon: [name], MD;  [facility];  Service: Endoscopy;  Laterality: N/A;  1:45PM  . COLONOSCOPY N/A 04/19/2019   Procedure: COLONOSCOPY;  Surgeon: [name], MD;  [facility];  Service: Endoscopy;  Laterality: N/A;  2:00pm  . EXCISION MASS LOWER EXTREMETIES Left 05/12/2018   Procedure: Left foot plantar fibroma excision;  Surgeon: [name], MD;  Location: [place];  Service: Orthopedics;  Laterality: Left;    There were no vitals filed for this visit.  Subjective Assessment - 06/20/19 0925    Subjective   S: It's a smidge better.    Currently in Pain?  Yes    Pain Score  4     Pain Location  Shoulder    Pain Orientation  Right    Pain Descriptors / Indicators  Aching;Sore    Pain Type  Acute pain         [facility] OT Assessment - 06/20/19 0926      Assessment   Medical Diagnosis  right shoulder pain      Precautions   Precautions  None               OT Treatments/Exercises (OP) - 06/20/19 0926      Exercises   Exercises  Shoulder      Shoulder Exercises: Supine   Protraction  PROM;5 reps    Horizontal ABduction  PROM;5 reps    External Rotation  PROM;5 reps    Internal Rotation  PROM;5 reps    Flexion  PROM;5 reps    ABduction  PROM;5 reps      Shoulder Exercises: Standing   Protraction  AROM;12 reps    Horizontal ABduction  AROM;12 reps    External Rotation  AROM;12 reps    Internal Rotation  AROM;12 reps    Flexion  AROM;12 reps  ABduction  AROM;12 reps      Shoulder Exercises: ROM/Strengthening   UBE (Upper Arm Bike)  Level 2 2' reverse   pace: 7.0-9.0   Other ROM/Strengthening Exercises  PVC pipe slide 12X      Manual Therapy   Manual Therapy  Myofascial release    Manual therapy comments  Manual techniques completed prior to exercises.     Myofascial Release  Myofascial release and manual stretching completed to right upper arm, trapezius, and scapularis region.              OT Education - 06/20/19 0941    Education Details  Complete A/ROM shoulder exercises standing.    Person(s) Educated  Patient    Methods  Explanation    Comprehension  Verbalized understanding       OT Short Term Goals - 06/13/19 1130      OT SHORT TERM GOAL #1   Title  Patient will be educated and independent with HEP in order to faciliate his progress in therapy and allow him to return to using his right UE more functionally and with less pain when reaching.    Time  1    Period  Weeks    Status  On-going    Target Date  06/30/19      OT SHORT  TERM GOAL #2   Title  Patient will increase RUE A/ROM to [facility] in order to increase the ability to reach above shoulder for items as needed.    Time  4    Period  Weeks    Status  On-going      OT SHORT TERM GOAL #3   Title  Patient will increase RUE shoulder strength to 5/5 to allow him greater stability in the shoulder and scapular region when lifting and carrying items.    Time  4    Period  Weeks    Status  On-going      OT SHORT TERM GOAL #4   Title  Patient will decrease fascial restrictions to trace amount or less in order to increase functional mobility needed to complete reaching tasks using his RUE.    Time  4    Period  Weeks    Status  On-going      OT SHORT TERM GOAL #5   Title  Patient will report a pain level of no greater than 2/10 when completing basic daily tasks using his RUE.    Time  4    Period  Weeks    Status  On-going               Plan - 06/20/19 1001    Clinical Impression Statement  A: Pt able to complete all exercises standing during A/ROM. Added pvc pipe slide and UBE bike in reverse. Pt continues to have min-mod fascial restrictions in the right upper arm and upper trapezius region. VC for form and technique.    Body Structure / Function / Physical Skills  ADL;UE functional use;Fascial restriction;Pain;ROM;Decreased knowledge of use of DME;Strength    Plan  P: Increase UBE bike 2' forward 2' reverse at level 3. Add proximal shoulder strengthening on door.    Consulted and Agree with Plan of Care  Patient       Patient will benefit from skilled therapeutic intervention in order to improve the following deficits and impairments:   Body Structure / Function / Physical Skills: ADL, UE functional use, Fascial restriction, Pain, ROM, Decreased knowledge of use of  DME, Strength       Visit Diagnosis: Stiffness of right shoulder, not elsewhere classified  Other symptoms and signs involving the musculoskeletal system  Chronic right shoulder  pain    Problem List Patient Active Problem List   Diagnosis Date Noted  . History of adenomatous polyp of colon 02/27/2019  . Paroxysmal atrial fibrillation ([place]) 07/18/2015  . Bradycardia 06/29/2015  . Near syncope   . Atrial fibrillation with rapid ventricular response ([place]) 06/27/2015  . Left ventricular diastolic dysfunction, NYHA class 3 10/26/2014  . Kidney cysts 04/04/2013  . HTN (hypertension) 03/31/2013  .  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 Diastolic dysfunction- grade 2- echo 06/28/15 03/31/2013  . RBBB 03/31/2013  . AV block, 1st degree 03/31/2013  . Sinus bradycardia 03/31/2013  . Hematochezia 12/29/2011   Ailene Ravel, OTR/L,CBIS  [phone]  06/20/2019, 10:04 AM  [place] [address] [place], [place], [address] Phone: [phone]   Fax:  [phone]  Name: Joseph [name] MRN: [id number] Date of Birth: 12-Dec-1945

## 2019-06-22 ENCOUNTER — Ambulatory Visit (HOSPITAL_COMMUNITY): Payer: Medicare Other

## 2019-06-22 ENCOUNTER — Encounter (HOSPITAL_COMMUNITY): Payer: Self-pay

## 2019-06-22 ENCOUNTER — Other Ambulatory Visit: Payer: Self-pay

## 2019-06-22 DIAGNOSIS — M25611 Stiffness of right shoulder, not elsewhere classified: Secondary | ICD-10-CM

## 2019-06-22 DIAGNOSIS — G8929 Other chronic pain: Secondary | ICD-10-CM

## 2019-06-22 DIAGNOSIS — R29898 Other symptoms and signs involving the musculoskeletal system: Secondary | ICD-10-CM

## 2019-06-22 NOTE — Therapy (Signed)
[facility] [place], [place], [address] Phone: [phone]   Fax:  [phone]  Occupational Therapy Treatment  Patient Details  Name: Joseph [name] MRN: [id number] Date of Birth: 01/13/1946 Referring Provider (OT): Carlis Abbott, NP   Encounter Date: 06/22/2019  OT End of Session - 06/22/19 0854    Visit Number  6    Number of Visits  8    Date for OT Re-Evaluation  06/30/19    Authorization Type  Double coverage: [place] No visit limit or authorization needed.    Authorization Time Period  Complete progress note on visit 10    Authorization - Visit Number  6    Authorization - Number of Visits  10    OT Start Time  [phone]   pt arrived late   OT Stop Time  0855    OT Time Calculation (min)  33 min    Activity Tolerance  Patient tolerated treatment well    Behavior During Therapy  [facility] for tasks assessed/performed       Past Medical History:  Diagnosis Date  . 1st degree AV block   . Diastolic dysfunction 123456   grade 1  . Hypertension   . New onset atrial fibrillation ([place]) 06/2015  . RBBB   . Sinus bradycardia   . Stroke [facility])    no defecits, pt denies having a stroke, says [name] diagnosed this    Past Surgical History:  Procedure Laterality Date  . COLONOSCOPY    . COLONOSCOPY  12/30/2011   Procedure: COLONOSCOPY;  Surgeon: [name], MD;  [facility];  Service: Endoscopy;  Laterality: N/A;  1:45PM  . COLONOSCOPY N/A 04/19/2019   Procedure: COLONOSCOPY;  Surgeon: [name], MD;  [facility];  Service: Endoscopy;  Laterality: N/A;  2:00pm  . EXCISION MASS LOWER EXTREMETIES Left 05/12/2018   Procedure: Left foot plantar fibroma excision;  Surgeon: [name], MD;  Location: [place];  Service: Orthopedics;  Laterality: Left;    There were no vitals filed for this visit.  Subjective Assessment - 06/22/19 0839    Subjective   S: It seems to be getting better.    Currently in Pain?  Yes    Pain Score  4     Pain Location  Shoulder    Pain Orientation  Right    Pain Descriptors / Indicators  Aching;Sore    Pain Type  Acute pain    Pain Radiating Towards  N/A    Pain Onset  More than a month ago    Pain Frequency  Constant    Aggravating Factors   increased use and movement    Pain Relieving Factors  positioning in comfortable position    Effect of Pain on Daily Activities  pushes through the pain    Multiple Pain Sites  No         [facility] OT Assessment - 06/22/19 0840      Assessment   Medical Diagnosis  right shoulder pain      Precautions   Precautions  None               OT Treatments/Exercises (OP) - 06/22/19 0840      Exercises   Exercises  Shoulder      Shoulder Exercises: Supine   Protraction  PROM;5 reps    Horizontal ABduction  PROM;5 reps    External Rotation  PROM;5 reps    Internal Rotation  PROM;5 reps    Flexion  PROM;5 reps    ABduction  PROM;5 reps      Shoulder Exercises: Standing   Protraction  AROM;15 reps    Horizontal ABduction  AROM;15 reps    External Rotation  AROM;15 reps    Internal Rotation  AROM;15 reps    Flexion  AROM;15 reps    ABduction  AROM;15 reps      Shoulder Exercises: ROM/Strengthening   UBE (Upper Arm Bike)  Level 3 2' reverse 2' forward    pace: 9.0-11.00   Other ROM/Strengthening Exercises  Proximal shoulder strengthening using washcloth on door 1' flexion 1' abduction      Manual Therapy   Manual Therapy  Myofascial release    Manual therapy comments  Manual techniques completed prior to exercises.     Myofascial Release  Myofascial release and manual stretching completed to right upper arm, trapezius, and scapularis region.                OT Short Term Goals - 06/13/19 1130      OT SHORT TERM GOAL #1   Title  Patient will be educated and independent with HEP in order to faciliate his progress in therapy and  allow him to return to using his right UE more functionally and with less pain when reaching.    Time  1    Period  Weeks    Status  On-going    Target Date  06/30/19      OT SHORT TERM GOAL #2   Title  Patient will increase RUE A/ROM to [facility] in order to increase the ability to reach above shoulder for items as needed.    Time  4    Period  Weeks    Status  On-going      OT SHORT TERM GOAL #3   Title  Patient will increase RUE shoulder strength to 5/5 to allow him greater stability in the shoulder and scapular region when lifting and carrying items.    Time  4    Period  Weeks    Status  On-going      OT SHORT TERM GOAL #4   Title  Patient will decrease fascial restrictions to trace amount or less in order to increase functional mobility needed to complete reaching tasks using his RUE.    Time  4    Period  Weeks    Status  On-going      OT SHORT TERM GOAL #5   Title  Patient will report a pain level of no greater than 2/10 when completing basic daily tasks using his RUE.    Time  4    Period  Weeks    Status  On-going               Plan - 06/22/19 [id number]    Clinical Impression Statement  A: Increased repetitions this date to 15 versus 12 while providing verbal cues for form and technique. Increased to level 3 on UBE bike to focus on shoulder stability and strength. Able to complete proximal shoulder strengthening on door with some muscle fatigue. Manual techniques used to focus on fascial restrictions in RUE.    Body Structure / Function / Physical Skills  ADL;UE functional use;Fascial restriction;Pain;ROM;Decreased knowledge of use of DME;Strength    Plan  P: Attempt strengthening supine with 1# handweight. Update HEP with scapular strengthening if able to complete with correct  form and technique.    Consulted and Agree with Plan of Care  Patient       Patient will benefit from skilled therapeutic intervention in order to improve the following deficits and impairments:    Body Structure / Function / Physical Skills: ADL, UE functional use, Fascial restriction, Pain, ROM, Decreased knowledge of use of DME, Strength       Visit Diagnosis: Other symptoms and signs involving the musculoskeletal system  Stiffness of right shoulder, not elsewhere classified  Chronic right shoulder pain    Problem List Patient Active Problem List   Diagnosis Date Noted  . History of adenomatous polyp of colon 02/27/2019  . Paroxysmal atrial fibrillation ([place]) 07/18/2015  . Bradycardia 06/29/2015  . Near syncope   . Atrial fibrillation with rapid ventricular response ([place]) 06/27/2015  . Left ventricular diastolic dysfunction, NYHA class 3 10/26/2014  . Kidney cysts 04/04/2013  . HTN (hypertension) 03/31/2013  .  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 Diastolic dysfunction- grade 2- echo 06/28/15 03/31/2013  . RBBB 03/31/2013  . AV block, 1st degree 03/31/2013  . Sinus bradycardia 03/31/2013  . Hematochezia 12/29/2011   Ailene Ravel, OTR/L,CBIS  [phone]  06/22/2019, 9:28 AM  [facility] [address] [place], [place], [address] Phone: [phone]   Fax:  [phone]  Name: TYMIR ONEAL MRN: [id number] Date of Birth: [birth year]

## 2019-06-26 ENCOUNTER — Encounter (HOSPITAL_COMMUNITY): Payer: Self-pay

## 2019-06-26 ENCOUNTER — Other Ambulatory Visit: Payer: Self-pay

## 2019-06-26 ENCOUNTER — Ambulatory Visit (HOSPITAL_COMMUNITY): Payer: Medicare Other

## 2019-06-26 DIAGNOSIS — M25611 Stiffness of right shoulder, not elsewhere classified: Secondary | ICD-10-CM

## 2019-06-26 DIAGNOSIS — G8929 Other chronic pain: Secondary | ICD-10-CM

## 2019-06-26 DIAGNOSIS — R29898 Other symptoms and signs involving the musculoskeletal system: Secondary | ICD-10-CM

## 2019-06-26 NOTE — Therapy (Signed)
[facility], [place], [address] Phone: [phone]   Fax:  [phone]  Occupational Therapy Treatment  Patient Details  Name: Joseph [name] MRN: [id number] Date of Birth: 03/13/1946 Referring Provider (OT): Carlis Abbott, NP   Encounter Date: 06/26/2019  OT End of Session - 06/26/19 1206    Visit Number  7    Number of Visits  8    Date for OT Re-Evaluation  06/30/19    Authorization Type  Double coverage: [place] No visit limit or authorization needed.    Authorization Time Period  Complete progress note on visit 10    Authorization - Visit Number  7    Authorization - Number of Visits  10    OT Start Time  1117    OT Stop Time  1157    OT Time Calculation (min)  40 min    Activity Tolerance  Patient tolerated treatment well    Behavior During Therapy  WFL for tasks assessed/performed       Past Medical History:  Diagnosis Date  . 1st degree AV block   . Diastolic dysfunction 123456   grade 1  . Hypertension   . New onset atrial fibrillation ([place]) 06/2015  . RBBB   . Sinus bradycardia   . Stroke [facility])    no defecits, pt denies having a stroke, says [name] diagnosed this    Past Surgical History:  Procedure Laterality Date  . COLONOSCOPY    . COLONOSCOPY  12/30/2011   Procedure: COLONOSCOPY;  Surgeon: [name], MD;  [facility];  Service: Endoscopy;  Laterality: N/A;  1:45PM  . COLONOSCOPY N/A 04/19/2019   Procedure: COLONOSCOPY;  Surgeon: [name], MD;  [facility];  Service: Endoscopy;  Laterality: N/A;  2:00pm  . EXCISION MASS LOWER EXTREMETIES Left 05/12/2018   Procedure: Left foot plantar fibroma excision;  Surgeon: [name], MD;  Location: [facility];  Service: Orthopedics;  Laterality: Left;    There were no vitals filed for this visit.  Subjective Assessment - 06/26/19 1137    Subjective   S: It's a  little better.    Currently in Pain?  Yes    Pain Score  4     Pain Location  Shoulder    Pain Orientation  Right    Pain Descriptors / Indicators  Aching;Sore    Pain Type  Acute pain         [facility] OT Assessment - 06/26/19 1137      Assessment   Medical Diagnosis  right shoulder pain      Precautions   Precautions  None               OT Treatments/Exercises (OP) - 06/26/19 1137      Exercises   Exercises  Shoulder      Shoulder Exercises: Supine   Protraction  PROM;5 reps    Horizontal ABduction  PROM;5 reps;Strengthening;12 reps    Horizontal ABduction Weight (lbs)  1    External Rotation  PROM;5 reps;Strengthening;12 reps    External Rotation Weight (lbs)  1    Internal Rotation  PROM;5 reps;Strengthening;12 reps    Internal Rotation Weight (lbs)  1    Flexion  PROM;5 reps;Strengthening;12 reps    Shoulder Flexion Weight (lbs)  1    ABduction  PROM;5 reps;Strengthening;12 reps    Shoulder ABduction Weight (lbs)  1  Shoulder Exercises: Standing   Horizontal ABduction  Strengthening;10 reps    Horizontal ABduction Weight (lbs)  1    External Rotation  Strengthening;10 reps    External Rotation Weight (lbs)  1    Internal Rotation  Strengthening;10 reps    Internal Rotation Weight (lbs)  1    Flexion  Strengthening;10 reps    Shoulder Flexion Weight (lbs)  1    ABduction  AROM;15 reps      Shoulder Exercises: ROM/Strengthening   Other ROM/Strengthening Exercises  Proximal shoulder strengthening using washcloth on door 1' flexion 1' abduction      Manual Therapy   Manual Therapy  Myofascial release    Manual therapy comments  Manual techniques completed prior to exercises.     Myofascial Release  Myofascial release and manual stretching completed to right upper arm, trapezius, and scapularis region.              OT Education - 06/26/19 1223    Education Details  Pt educated that he may begin strengthening with a 1# hand weight/water  bottle. Supine exercises will be more comfortable than standing.    Person(s) Educated  Patient    Methods  Explanation    Comprehension  Verbalized understanding       OT Short Term Goals - 06/13/19 1130      OT SHORT TERM GOAL #1   Title  Patient will be educated and independent with HEP in order to faciliate his progress in therapy and allow him to return to using his right UE more functionally and with less pain when reaching.    Time  1    Period  Weeks    Status  On-going    Target Date  06/30/19      OT SHORT TERM GOAL #2   Title  Patient will increase RUE A/ROM to [facility] in order to increase the ability to reach above shoulder for items as needed.    Time  4    Period  Weeks    Status  On-going      OT SHORT TERM GOAL #3   Title  Patient will increase RUE shoulder strength to 5/5 to allow him greater stability in the shoulder and scapular region when lifting and carrying items.    Time  4    Period  Weeks    Status  On-going      OT SHORT TERM GOAL #4   Title  Patient will decrease fascial restrictions to trace amount or less in order to increase functional mobility needed to complete reaching tasks using his RUE.    Time  4    Period  Weeks    Status  On-going      OT SHORT TERM GOAL #5   Title  Patient will report a pain level of no greater than 2/10 when completing basic daily tasks using his RUE.    Time  4    Period  Weeks    Status  On-going               Plan - 06/26/19 1207    Clinical Impression Statement  A: Progressed to strengthening supine and some strengthening standing. Patient reports that majority of pain/soreness felt in his shoulder is anteriorly although no fascial restrictions were palpated in this area during session. Manual techniques were completed to address fascial restrictions in the anterior and medial portion of the shoulder. VC for form and technique were provided during session.  Difficulty with comfort and ROM when attempting  standing abduction with weight. Modified exercise to continue with less discomfort and greater ROM.    Body Structure / Function / Physical Skills  ADL;UE functional use;Fascial restriction;Pain;ROM;Decreased knowledge of use of DME;Strength    Plan  P: Reassessment, FOTO. Review progress and goals. Determine if patient would like to continue with therapy or wait to follow up with MD. He is supposed to find out when his follow up visit is and let us know at next session.    Consulted and Agree with Plan of Care  Patient       Patient will benefit from skilled therapeutic intervention in order to improve the following deficits and impairments:   Body Structure / Function / Physical Skills: ADL, UE functional use, Fascial restriction, Pain, ROM, Decreased knowledge of use of DME, Strength       Visit Diagnosis: Stiffness of right shoulder, not elsewhere classified  Chronic right shoulder pain  Other symptoms and signs involving the musculoskeletal system    Problem List Patient Active Problem List   Diagnosis Date Noted  . History of adenomatous polyp of colon 02/27/2019  . Paroxysmal atrial fibrillation ([place]) 07/18/2015  . Bradycardia 06/29/2015  . Near syncope   . Atrial fibrillation with rapid ventricular response ([place]) 06/27/2015  . Left ventricular diastolic dysfunction, NYHA class 3 10/26/2014  . Kidney cysts 04/04/2013  . HTN (hypertension) 03/31/2013  .  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 Diastolic dysfunction- grade 2- echo 06/28/15 03/31/2013  . RBBB 03/31/2013  . AV block, 1st degree 03/31/2013  . Sinus bradycardia 03/31/2013  . Hematochezia 12/29/2011   Ailene Ravel, OTR/L,CBIS  [phone]  06/26/2019, 12:25 PM  [facility] [address] [place], [place], [address] Phone: [phone]   Fax:  [phone]  Name: Joseph [name] MRN: [id number] Date of Birth: Jan 17, 1946

## 2019-06-28 ENCOUNTER — Other Ambulatory Visit: Payer: Self-pay

## 2019-06-28 ENCOUNTER — Encounter (HOSPITAL_COMMUNITY): Payer: Self-pay | Admitting: Occupational Therapy

## 2019-06-28 ENCOUNTER — Ambulatory Visit (HOSPITAL_COMMUNITY): Payer: Medicare Other | Admitting: Occupational Therapy

## 2019-06-28 DIAGNOSIS — M25611 Stiffness of right shoulder, not elsewhere classified: Secondary | ICD-10-CM | POA: Diagnosis not present

## 2019-06-28 DIAGNOSIS — G8929 Other chronic pain: Secondary | ICD-10-CM

## 2019-06-28 DIAGNOSIS — R29898 Other symptoms and signs involving the musculoskeletal system: Secondary | ICD-10-CM

## 2019-06-28 NOTE — Therapy (Addendum)
[place], [place], [address] Phone: [phone]   Fax:  [phone]  Occupational Therapy Reassessment, Treatment (recertification)  Patient Details  Name: Joseph [name] MRN: [id number] Date of Birth: 04/16/1946 Referring Provider (OT): Carlis Abbott, NP   Progress Note Reporting Period 06/02/2019 to 06/28/2019  See note below for Objective Data and Assessment of Progress/Goals.       Encounter Date: 06/28/2019  OT End of Session - 06/28/19 1338    Visit Number  8    Number of Visits  14    Date for OT Re-Evaluation  07/28/19    Authorization Type  Double coverage: [place] No visit limit or authorization needed.    Authorization Time Period  Complete progress note on visit 23    Authorization - Visit Number  8    Authorization - Number of Visits  18    OT Start Time  1302    OT Stop Time  1340    OT Time Calculation (min)  38 min    Activity Tolerance  Patient tolerated treatment well    Behavior During Therapy  WFL for tasks assessed/performed       Past Medical History:  Diagnosis Date  . 1st degree AV block   . Diastolic dysfunction 9/38   grade 1  . Hypertension   . New onset atrial fibrillation ([place]) 06/2015  . RBBB   . Sinus bradycardia   . Stroke [facility])    no defecits, pt denies having a stroke, says [name] diagnosed this    Past Surgical History:  Procedure Laterality Date  . COLONOSCOPY    . COLONOSCOPY  12/30/2011   Procedure: COLONOSCOPY;  Surgeon: [name], MD;  [facility];  Service: Endoscopy;  Laterality: N/A;  1:45PM  . COLONOSCOPY N/A 04/19/2019   Procedure: COLONOSCOPY;  Surgeon: [name], MD;  [facility];  Service: Endoscopy;  Laterality: N/A;  2:00pm  . EXCISION MASS LOWER EXTREMETIES Left 05/12/2018   Procedure: Left foot plantar fibroma excision;  Surgeon: [name], MD;  Location: [facility];  Service: Orthopedics;  Laterality: Left;    There were no vitals filed for this visit.  Subjective Assessment - 06/28/19 1302    Subjective   S: It might be a little less painful than when I began coming.    Currently in Pain?  Yes    Pain Score  4     Pain Location  Shoulder    Pain Orientation  Right    Pain Descriptors / Indicators  Aching;Sore    Pain Type  Chronic pain    Pain Radiating Towards  N/A    Pain Onset  More than a month ago    Pain Frequency  Constant    Aggravating Factors   increased use and movement    Pain Relieving Factors  positioning in comfortable position    Effect of Pain on Daily Activities  pushes through the pain    Multiple Pain Sites  No         [facility] OT Assessment - 06/28/19 1302      Assessment   Medical Diagnosis  right shoulder pain      Precautions   Precautions  None      Observation/Other Assessments   Focus on Therapeutic Outcomes (FOTO)   60/100   70/100 previous     Palpation   Palpation  comment  Min fascial restrictions in the right upper arm, trapezius, and scapularis region.      AROM   Overall AROM Comments  Assessed seated. IR/er adducted    AROM Assessment Site  Shoulder    Right/Left Shoulder  Right    Right Shoulder Flexion  155 Degrees   125 previous   Right Shoulder ABduction  144 Degrees   135 previous   Right Shoulder Internal Rotation  90 Degrees   same as previous   Right Shoulder External Rotation  55 Degrees   same as previous     PROM   Overall PROM Comments  Assessed supine. IR/er adducted    PROM Assessment Site  Shoulder    Right/Left Shoulder  Right    Right Shoulder Flexion  145 Degrees   142 previous   Right Shoulder ABduction  180 Degrees   111 previous   Right Shoulder Internal Rotation  90 Degrees   same as previous   Right Shoulder External Rotation  60 Degrees   45 previous     Strength   Overall Strength Comments  Assessed seated. IR/er adducted    Strength  Assessment Site  Shoulder    Right/Left Shoulder  Right    Right Shoulder Flexion  4/5   4-/5 previous   Right Shoulder ABduction  4-/5   same as previous   Right Shoulder Internal Rotation  5/5   same as previous   Right Shoulder External Rotation  4+/5   4+/5 previous              OT Treatments/Exercises (OP) - 06/28/19 1305      Exercises   Exercises  Shoulder      Shoulder Exercises: Supine   Protraction  PROM;5 reps;Strengthening;12 reps    Protraction Weight (lbs)  1    Horizontal ABduction  PROM;5 reps;Strengthening;12 reps    Horizontal ABduction Weight (lbs)  1    External Rotation  PROM;5 reps;Strengthening;12 reps    External Rotation Weight (lbs)  1    Internal Rotation  PROM;5 reps;Strengthening;12 reps    Internal Rotation Weight (lbs)  1    Flexion  PROM;5 reps;Strengthening;12 reps    Shoulder Flexion Weight (lbs)  1    ABduction  PROM;5 reps;Strengthening;12 reps    Shoulder ABduction Weight (lbs)  1      Shoulder Exercises: Standing   Horizontal ABduction  Strengthening;10 reps    Horizontal ABduction Weight (lbs)  1    External Rotation  Strengthening;10 reps    External Rotation Weight (lbs)  1    Internal Rotation  Strengthening;10 reps    Internal Rotation Weight (lbs)  1    Flexion  Strengthening;10 reps    Shoulder Flexion Weight (lbs)  1    ABduction  AROM;15 reps      Manual Therapy   Manual Therapy  Myofascial release    Manual therapy comments  Manual techniques completed prior to exercises.     Myofascial Release  Myofascial release and manual stretching completed to right upper arm, trapezius, and scapularis region.                OT Short Term Goals - 06/28/19 1329      OT SHORT TERM GOAL #1   Title  Patient will be educated and independent with HEP in order to faciliate his progress in therapy and allow him to return to using his right UE more functionally and with less pain when reaching.  Time  1    Period   Weeks    Status  Achieved    Target Date  06/30/19      OT SHORT TERM GOAL #2   Title  Patient will increase RUE A/ROM to [facility] in order to increase the ability to reach above shoulder for items as needed.    Time  4    Period  Weeks    Status  Achieved      OT SHORT TERM GOAL #3   Title  Patient will increase RUE shoulder strength to 5/5 to allow him greater stability in the shoulder and scapular region when lifting and carrying items.    Time  4    Period  Weeks    Status  Partially Met      OT SHORT TERM GOAL #4   Title  Patient will decrease fascial restrictions to trace amount or less in order to increase functional mobility needed to complete reaching tasks using his RUE.    Time  4    Period  Weeks    Status  On-going      OT SHORT TERM GOAL #5   Title  Patient will report a pain level of no greater than 2/10 when completing basic daily tasks using his RUE.    Time  4    Period  Weeks    Status  On-going               Plan - 06/28/19 1332    Clinical Impression Statement  A: Reassessment completed this session, pt has met 2/5 goals and has partially met an additional goal. Pt has improved his ROM to [facility] in all planes and has improved strength as well. Pt continues to report pain with use of RUE during functional tasks. Discussed sleeping on opposite side to relieve pressure on RUE at night. Continued with strengthening this session, using 1# hand weights. Verbal cuing for form and technique.    OT Occupational Profile and History  Problem Focused Assessment - Including review of records relating to presenting problem    Occupational performance deficits (Please refer to evaluation for details):  ADL's;Rest and Sleep;IADL's    Body Structure / Function / Physical Skills  ADL;UE functional use;Fascial restriction;Pain;ROM;Decreased knowledge of use of DME;Strength    Rehab Potential  Good    Clinical Decision Making  Limited treatment options, no task modification  necessary    Comorbidities Affecting Occupational Performance:  May have comorbidities impacting occupational performance    Modification or Assistance to Complete Evaluation   No modification of tasks or assist necessary to complete eval    OT Frequency  2x / week   1x/week   OT Duration  2 weeks   2 more weeks   OT Treatment/Interventions  Self-care/ADL training;Ultrasound;DME and/or AE instruction;Patient/family education;Passive range of motion;Cryotherapy;Electrical Stimulation;Moist Heat;Neuromuscular education;Therapeutic exercise;Manual Therapy;Therapeutic activities    Plan  P: Continue with skilled OT services focusing on reducing pain and improving strength and functional use of RUE with plan to progress to HEP at end of next 4 weeks. Next session: continue with strengthening and add therapy ball tasks. Add x to v arms    Consulted and Agree with Plan of Care  Patient       Patient will benefit from skilled therapeutic intervention in order to improve the following deficits and impairments:   Body Structure / Function / Physical Skills: ADL, UE functional use, Fascial restriction, Pain, ROM, Decreased knowledge of use  of DME, Strength       Visit Diagnosis: Stiffness of right shoulder, not elsewhere classified  Chronic right shoulder pain  Other symptoms and signs involving the musculoskeletal system    Problem List Patient Active Problem List   Diagnosis Date Noted  . History of adenomatous polyp of colon 02/27/2019  . Paroxysmal atrial fibrillation ([place]) 07/18/2015  . Bradycardia 06/29/2015  . Near syncope   . Atrial fibrillation with rapid ventricular response ([place]) 06/27/2015  . Left ventricular diastolic dysfunction, NYHA class 3 10/26/2014  . Kidney cysts 04/04/2013  . HTN (hypertension) 03/31/2013  .  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 Diastolic dysfunction- grade 2- echo 06/28/15 03/31/2013  . RBBB 03/31/2013  . AV block, 1st degree 03/31/2013  .  Sinus bradycardia 03/31/2013  . Hematochezia 12/29/2011   Guadelupe Sabin, OTR/L  [phone] 06/28/2019, 1:43 PM  [facility] [address] [place], [place], [address] Phone: [phone]   Fax:  [phone]  Name: JULIUS MATUS MRN: [id number] Date of Birth: 04/16/1946

## 2019-07-03 ENCOUNTER — Ambulatory Visit (HOSPITAL_COMMUNITY): Payer: Medicare Other | Admitting: Occupational Therapy

## 2019-07-03 ENCOUNTER — Encounter (HOSPITAL_COMMUNITY): Payer: Self-pay | Admitting: Occupational Therapy

## 2019-07-03 DIAGNOSIS — R29898 Other symptoms and signs involving the musculoskeletal system: Secondary | ICD-10-CM

## 2019-07-03 DIAGNOSIS — M25611 Stiffness of right shoulder, not elsewhere classified: Secondary | ICD-10-CM | POA: Diagnosis not present

## 2019-07-03 DIAGNOSIS — G8929 Other chronic pain: Secondary | ICD-10-CM

## 2019-07-03 NOTE — Therapy (Signed)
[place], [place], [address] Phone: [phone]   Fax:  [phone]  Occupational Therapy Treatment  Patient Details  Name: Joseph [name] MRN: [id number] Date of Birth: [birth year] Referring Provider (OT): Carlis Abbott, NP   Encounter Date: 07/03/2019  OT End of Session - 07/03/19 1659    Visit Number  9    Number of Visits  14    Date for OT Re-Evaluation  07/28/19    Authorization Type  Double coverage: [place] No visit limit or authorization needed.    Authorization Time Period  Complete progress note on visit 15    Authorization - Visit Number  9    Authorization - Number of Visits  18    OT Start Time  1648   pt time started at 4:54 due to pt needing to make a phone call   OT Stop Time  1728    OT Time Calculation (min)  40 min    Activity Tolerance  Patient tolerated treatment well    Behavior During Therapy  [facility] for tasks assessed/performed       Past Medical History:  Diagnosis Date  . 1st degree AV block   . Diastolic dysfunction 0/62   grade 1  . Hypertension   . New onset atrial fibrillation ([place]) 06/2015  . RBBB   . Sinus bradycardia   . Stroke [facility])    no defecits, pt denies having a stroke, says [name] diagnosed this    Past Surgical History:  Procedure Laterality Date  . COLONOSCOPY    . COLONOSCOPY  12/30/2011   Procedure: COLONOSCOPY;  Surgeon: [name], MD;  [facility];  Service: Endoscopy;  Laterality: N/A;  1:45PM  . COLONOSCOPY N/A 04/19/2019   Procedure: COLONOSCOPY;  Surgeon: [name], MD;  [facility];  Service: Endoscopy;  Laterality: N/A;  2:00pm  . EXCISION MASS LOWER EXTREMETIES Left 05/12/2018   Procedure: Left foot plantar fibroma excision;  Surgeon: [name], MD;  Location: [facility];  Service: Orthopedics;  Laterality: Left;    There were no vitals filed for this  visit.  Subjective Assessment - 07/03/19 1649    Subjective   S: It seems to be a little more painful than it was.    Currently in Pain?  Yes    Pain Score  5     Pain Location  Shoulder    Pain Orientation  Right    Pain Descriptors / Indicators  Aching;Sore    Pain Type  Chronic pain    Pain Radiating Towards  N/A    Pain Onset  More than a month ago    Pain Frequency  Constant    Aggravating Factors   increased use and movement    Pain Relieving Factors  positioning in comfortable position    Effect of Pain on Daily Activities  pushes through the pain    Multiple Pain Sites  No         [facility] OT Assessment - 07/03/19 1648      Assessment   Medical Diagnosis  right shoulder pain      Precautions   Precautions  None               OT Treatments/Exercises (OP) - 07/03/19 1653      Exercises   Exercises  Shoulder      Shoulder Exercises: Supine  Protraction  PROM;5 reps;Strengthening;12 reps    Protraction Weight (lbs)  1    Horizontal ABduction  PROM;5 reps;Strengthening;12 reps    Horizontal ABduction Weight (lbs)  1    External Rotation  PROM;5 reps;Strengthening;12 reps    External Rotation Weight (lbs)  1    Internal Rotation  PROM;5 reps;Strengthening;12 reps    Internal Rotation Weight (lbs)  1    Flexion  PROM;5 reps;Strengthening;12 reps    Shoulder Flexion Weight (lbs)  1    ABduction  PROM;5 reps;Strengthening;12 reps    Shoulder ABduction Weight (lbs)  1      Shoulder Exercises: Standing   Horizontal ABduction  AROM;12 reps    External Rotation  Strengthening;10 reps    External Rotation Weight (lbs)  1    Internal Rotation  Strengthening;10 reps    Internal Rotation Weight (lbs)  1    Flexion  Strengthening;10 reps    Shoulder Flexion Weight (lbs)  1    ABduction  AROM;12 reps      Shoulder Exercises: Therapy Ball   Other Therapy Ball Exercises  green therapy ball: chest press, overhead press, flexion, circles each direction, 12X each       Shoulder Exercises: ROM/Strengthening   UBE (Upper Arm Bike)  Level 3 1' reverse 1' forward    pace: 8.0   X to V Arms  10X    Ball on Wall  1' flexion 1' abduction               OT Short Term Goals - 06/28/19 1329      OT SHORT TERM GOAL #1   Title  Patient will be educated and independent with HEP in order to faciliate his progress in therapy and allow him to return to using his right UE more functionally and with less pain when reaching.    Time  1    Period  Weeks    Status  Achieved    Target Date  06/30/19      OT SHORT TERM GOAL #2   Title  Patient will increase RUE A/ROM to [facility] in order to increase the ability to reach above shoulder for items as needed.    Time  4    Period  Weeks    Status  Achieved      OT SHORT TERM GOAL #3   Title  Patient will increase RUE shoulder strength to 5/5 to allow him greater stability in the shoulder and scapular region when lifting and carrying items.    Time  4    Period  Weeks    Status  Partially Met      OT SHORT TERM GOAL #4   Title  Patient will decrease fascial restrictions to trace amount or less in order to increase functional mobility needed to complete reaching tasks using his RUE.    Time  4    Period  Weeks    Status  On-going      OT SHORT TERM GOAL #5   Title  Patient will report a pain level of no greater than 2/10 when completing basic daily tasks using his RUE.    Time  4    Period  Weeks    Status  On-going               Plan - 07/03/19 1700    Clinical Impression Statement  A: Pt reporting less pain at the end of last week but began having increased pain  on Saturday, unknown cause. No manual therapy completed today due to time constraints. Pt with guarding during passive stretching, verbal cuing to relax arm. Continued with strengthening using 1# weights, therapy ball tasks and x to v arms. Verbal cuing for form and technique, occasional reports of mild pain.    Body Structure / Function /  Physical Skills  ADL;UE functional use;Fascial restriction;Pain;ROM;Decreased knowledge of use of DME;Strength    Plan  P: manual therapy as needed, continue with therapy ball tasks       Patient will benefit from skilled therapeutic intervention in order to improve the following deficits and impairments:   Body Structure / Function / Physical Skills: ADL, UE functional use, Fascial restriction, Pain, ROM, Decreased knowledge of use of DME, Strength       Visit Diagnosis: Stiffness of right shoulder, not elsewhere classified  Chronic right shoulder pain  Other symptoms and signs involving the musculoskeletal system    Problem List Patient Active Problem List   Diagnosis Date Noted  . History of adenomatous polyp of colon 02/27/2019  . Paroxysmal atrial fibrillation ([place]) 07/18/2015  . Bradycardia 06/29/2015  . Near syncope   . Atrial fibrillation with rapid ventricular response ([place]) 06/27/2015  . Left ventricular diastolic dysfunction, NYHA class 3 10/26/2014  . Kidney cysts 04/04/2013  . HTN (hypertension) 03/31/2013  .  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 Diastolic dysfunction- grade 2- echo 06/28/15 03/31/2013  . RBBB 03/31/2013  . AV block, 1st degree 03/31/2013  . Sinus bradycardia 03/31/2013  . Hematochezia 12/29/2011   Guadelupe Sabin, OTR/L  [phone] 07/03/2019, 5:29 PM  [facility] [address] [place], [place], [address] Phone: [phone]   Fax:  [phone]  Name: Joseph [name] MRN: [id number] Date of Birth: 27-Jul-1945

## 2019-07-05 ENCOUNTER — Telehealth (HOSPITAL_COMMUNITY): Payer: Self-pay | Admitting: Specialist

## 2019-07-05 ENCOUNTER — Encounter (HOSPITAL_COMMUNITY): Payer: Self-pay

## 2019-07-05 ENCOUNTER — Ambulatory Visit (HOSPITAL_COMMUNITY): Payer: Medicare Other | Admitting: Specialist

## 2019-07-05 ENCOUNTER — Other Ambulatory Visit: Payer: Self-pay

## 2019-07-05 NOTE — Telephone Encounter (Signed)
Pt appt was cancelled because he was late

## 2019-07-06 ENCOUNTER — Encounter

## 2019-07-11 ENCOUNTER — Other Ambulatory Visit: Payer: Self-pay

## 2019-07-11 ENCOUNTER — Ambulatory Visit (HOSPITAL_COMMUNITY): Payer: Medicare Other

## 2019-07-11 DIAGNOSIS — M25511 Pain in right shoulder: Secondary | ICD-10-CM

## 2019-07-11 DIAGNOSIS — G8929 Other chronic pain: Secondary | ICD-10-CM

## 2019-07-11 DIAGNOSIS — M25611 Stiffness of right shoulder, not elsewhere classified: Secondary | ICD-10-CM

## 2019-07-11 DIAGNOSIS — R29898 Other symptoms and signs involving the musculoskeletal system: Secondary | ICD-10-CM

## 2019-07-11 NOTE — Therapy (Signed)
[place], [place], [address] Phone: [phone]   Fax:  [phone]  Occupational Therapy Treatment  Patient Details  Name: Joseph [name] MRN: [id number] Date of Birth: 17-Jan-1946 Referring Provider (OT): Carlis Abbott, NP   Encounter Date: 07/11/2019  OT End of Session - 07/11/19 1824    Visit Number  10    Number of Visits  14    Date for OT Re-Evaluation  07/28/19    Authorization Type  Double coverage: [place] No visit limit or authorization needed.    Authorization Time Period  Complete progress note on visit 25    Authorization - Visit Number  10    Authorization - Number of Visits  18    OT Start Time  0076    OT Stop Time  1725    OT Time Calculation (min)  35 min    Activity Tolerance  Patient tolerated treatment well    Behavior During Therapy  WFL for tasks assessed/performed       Past Medical History:  Diagnosis Date  . 1st degree AV block   . Diastolic dysfunction 2/26   grade 1  . Hypertension   . New onset atrial fibrillation ([place]) 06/2015  . RBBB   . Sinus bradycardia   . Stroke [facility])    no defecits, pt denies having a stroke, says [name] diagnosed this    Past Surgical History:  Procedure Laterality Date  . COLONOSCOPY    . COLONOSCOPY  12/30/2011   Procedure: COLONOSCOPY;  Surgeon: [name], MD;  [facility];  Service: Endoscopy;  Laterality: N/A;  1:45PM  . COLONOSCOPY N/A 04/19/2019   Procedure: COLONOSCOPY;  Surgeon: [name], MD;  [facility];  Service: Endoscopy;  Laterality: N/A;  2:00pm  . EXCISION MASS LOWER EXTREMETIES Left 05/12/2018   Procedure: Left foot plantar fibroma excision;  Surgeon: [name], MD;  Location: [facility];  Service: Orthopedics;  Laterality: Left;    There were no vitals filed for this visit.      [facility] OT Assessment - 07/11/19 1828      Assessment   Medical Diagnosis  right shoulder pain      Precautions   Precautions  None               OT Treatments/Exercises (OP) - 07/11/19 0300      Exercises   Exercises  Shoulder      Shoulder Exercises: Supine   Protraction  PROM;5 reps;Strengthening;12 reps    Protraction Weight (lbs)  2    Horizontal ABduction  PROM;5 reps;Strengthening;12 reps    Horizontal ABduction Weight (lbs)  2    External Rotation  PROM;5 reps;Strengthening;12 reps    External Rotation Weight (lbs)  2    Internal Rotation  PROM;5 reps;Strengthening;12 reps    Internal Rotation Weight (lbs)  2    Flexion  PROM;5 reps;Strengthening;12 reps    Shoulder Flexion Weight (lbs)  2    ABduction  PROM;5 reps;Strengthening;12 reps    Shoulder ABduction Weight (lbs)  2      Shoulder Exercises: Standing   Protraction  Strengthening;10 reps    Protraction Weight (lbs)  2    Horizontal ABduction  Strengthening;10 reps    Horizontal ABduction Weight (lbs)  2    External Rotation  Strengthening;10 reps    External Rotation Weight (lbs)  2  Internal Rotation  Strengthening;10 reps    Internal Rotation Weight (lbs)  2    Flexion  Strengthening;10 reps    Shoulder Flexion Weight (lbs)  2    ABduction  Strengthening;10 reps    Shoulder ABduction Weight (lbs)  2      Shoulder Exercises: ROM/Strengthening   X to V Arms  10X               OT Short Term Goals - 06/28/19 1329      OT SHORT TERM GOAL #1   Title  Patient will be educated and independent with HEP in order to faciliate his progress in therapy and allow him to return to using his right UE more functionally and with less pain when reaching.    Time  1    Period  Weeks    Status  Achieved    Target Date  06/30/19      OT SHORT TERM GOAL #2   Title  Patient will increase RUE A/ROM to [facility] in order to increase the ability to reach above shoulder for items as needed.    Time  4    Period  Weeks    Status  Achieved      OT SHORT TERM GOAL  #3   Title  Patient will increase RUE shoulder strength to 5/5 to allow him greater stability in the shoulder and scapular region when lifting and carrying items.    Time  4    Period  Weeks    Status  Partially Met      OT SHORT TERM GOAL #4   Title  Patient will decrease fascial restrictions to trace amount or less in order to increase functional mobility needed to complete reaching tasks using his RUE.    Time  4    Period  Weeks    Status  On-going      OT SHORT TERM GOAL #5   Title  Patient will report a pain level of no greater than 2/10 when completing basic daily tasks using his RUE.    Time  4    Period  Weeks    Status  On-going               Plan - 07/11/19 1824    Clinical Impression Statement  A: Progressed to 2# hand weights as patient reports that 1# weights were beginning to feel easier during previous session. VC for form and technique were provided throughout session. Rest breaks were encouraged as needed.    Body Structure / Function / Physical Skills  ADL;UE functional use;Fascial restriction;Pain;ROM;Decreased knowledge of use of DME;Strength    Plan  P: Continue with 2# hand weights for strengthening. Continue with therapy ball tasks.    Consulted and Agree with Plan of Care  Patient       Patient will benefit from skilled therapeutic intervention in order to improve the following deficits and impairments:   Body Structure / Function / Physical Skills: ADL, UE functional use, Fascial restriction, Pain, ROM, Decreased knowledge of use of DME, Strength       Visit Diagnosis: Other symptoms and signs involving the musculoskeletal system  Chronic right shoulder pain  Stiffness of right shoulder, not elsewhere classified    Problem List Patient Active Problem List   Diagnosis Date Noted  . History of adenomatous polyp of colon 02/27/2019  . Paroxysmal atrial fibrillation ([place]) 07/18/2015  . Bradycardia 06/29/2015  . Near syncope   .  Atrial  fibrillation with rapid ventricular response ([place]) 06/27/2015  . Left ventricular diastolic dysfunction, NYHA class 3 10/26/2014  . Kidney cysts 04/04/2013  . HTN (hypertension) 03/31/2013  .  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 Diastolic dysfunction- grade 2- echo 06/28/15 03/31/2013  . RBBB 03/31/2013  . AV block, 1st degree 03/31/2013  . Sinus bradycardia 03/31/2013  . Hematochezia 12/29/2011   Ailene Ravel, OTR/L,CBIS  [phone]  07/11/2019, 6:29 PM  [facility] [address] [place], [place], [address] Phone: [phone]   Fax:  [phone]  Name: Joseph [name] MRN: [id number] Date of Birth: May 01, 1946

## 2019-07-12 ENCOUNTER — Ambulatory Visit (HOSPITAL_COMMUNITY): Payer: Medicare Other

## 2019-07-12 ENCOUNTER — Encounter (HOSPITAL_COMMUNITY): Payer: Self-pay

## 2019-07-12 DIAGNOSIS — M25611 Stiffness of right shoulder, not elsewhere classified: Secondary | ICD-10-CM | POA: Diagnosis not present

## 2019-07-12 DIAGNOSIS — R29898 Other symptoms and signs involving the musculoskeletal system: Secondary | ICD-10-CM

## 2019-07-12 DIAGNOSIS — G8929 Other chronic pain: Secondary | ICD-10-CM

## 2019-07-12 NOTE — Therapy (Signed)
[place], [place], [address] Phone: [phone]   Fax:  [phone]  Occupational Therapy Treatment  Patient Details  Name: Joseph [name] MRN: [id number] Date of Birth: June 24, 1946 Referring Provider (OT): Carlis Abbott, NP   Encounter Date: 07/12/2019  OT End of Session - 07/12/19 1144    Visit Number  11    Number of Visits  14    Date for OT Re-Evaluation  07/28/19    Authorization Type  Double coverage: [place] No visit limit or authorization needed.    Authorization Time Period  Complete progress note on visit 18    Authorization - Visit Number  11    Authorization - Number of Visits  18    OT Start Time  [phone]    OT Stop Time  0941    OT Time Calculation (min)  38 min    Activity Tolerance  Patient tolerated treatment well    Behavior During Therapy  WFL for tasks assessed/performed       Past Medical History:  Diagnosis Date  . 1st degree AV block   . Diastolic dysfunction 0/99   grade 1  . Hypertension   . New onset atrial fibrillation ([place]) 06/2015  . RBBB   . Sinus bradycardia   . Stroke [facility])    no defecits, pt denies having a stroke, says [name] diagnosed this    Past Surgical History:  Procedure Laterality Date  . COLONOSCOPY    . COLONOSCOPY  12/30/2011   Procedure: COLONOSCOPY;  Surgeon: [name], MD;  [facility];  Service: Endoscopy;  Laterality: N/A;  1:45PM  . COLONOSCOPY N/A 04/19/2019   Procedure: COLONOSCOPY;  Surgeon: [name], MD;  [facility];  Service: Endoscopy;  Laterality: N/A;  2:00pm  . EXCISION MASS LOWER EXTREMETIES Left 05/12/2018   Procedure: Left foot plantar fibroma excision;  Surgeon: [name], MD;  Location: [place];  Service: Orthopedics;  Laterality: Left;    There were no vitals filed for this visit.  Subjective Assessment - 07/12/19 0915    Subjective   S: It  feels pretty good today.    Currently in Pain?  Yes    Pain Score  3     Pain Location  Shoulder    Pain Orientation  Right    Pain Descriptors / Indicators  Sore    Pain Type  Chronic pain    Pain Radiating Towards  N/A    Pain Onset  More than a month ago    Pain Frequency  Constant    Aggravating Factors   increased use and movement    Pain Relieving Factors  positionng in comfortable position    Effect of Pain on Daily Activities  no effect    Multiple Pain Sites  No         [facility] OT Assessment - 07/12/19 0917      Assessment   Medical Diagnosis  right shoulder pain      Precautions   Precautions  None               OT Treatments/Exercises (OP) - 07/12/19 0917      Exercises   Exercises  Shoulder      Shoulder Exercises: Supine   Protraction  PROM;5 reps    Horizontal ABduction  PROM;5 reps    External Rotation  PROM;5 reps  Internal Rotation  PROM;5 reps    Flexion  PROM;5 reps    ABduction  PROM;5 reps      Shoulder Exercises: Therapy Ball   Other Therapy Ball Exercises  green therapy ball: chest press, overhead press, flexion, circles each direction, 12X each      Shoulder Exercises: ROM/Strengthening   UBE (Upper Arm Bike)  Level 3 2' forward 2' reverse   pace: 8.0-10.0   Over Head Lace  2' seated    X to V Arms  10X    Ball on Wall  1' flexion 1' abduction               OT Short Term Goals - 07/12/19 0915      OT SHORT TERM GOAL #1   Title  Patient will be educated and independent with HEP in order to faciliate his progress in therapy and allow him to return to using his right UE more functionally and with less pain when reaching.    Time  1    Period  Weeks    Target Date  06/30/19      OT SHORT TERM GOAL #2   Title  Patient will increase RUE A/ROM to [facility] in order to increase the ability to reach above shoulder for items as needed.    Time  4    Period  Weeks      OT SHORT TERM GOAL #3   Title  Patient will increase RUE  shoulder strength to 5/5 to allow him greater stability in the shoulder and scapular region when lifting and carrying items.    Time  4    Period  Weeks    Status  Partially Met      OT SHORT TERM GOAL #4   Title  Patient will decrease fascial restrictions to trace amount or less in order to increase functional mobility needed to complete reaching tasks using his RUE.    Time  4    Period  Weeks    Status  On-going      OT SHORT TERM GOAL #5   Title  Patient will report a pain level of no greater than 2/10 when completing basic daily tasks using his RUE.    Time  4    Period  Weeks    Status  On-going               Plan - 07/12/19 1145    Clinical Impression Statement  A: Eliminated supine strengthening this session to have enough time to resume missed exercises from previous session. Focused on shoulder and scapular strengthening during session. provided VC for form and technique.    Body Structure / Function / Physical Skills  ADL;UE functional use;Fascial restriction;Pain;ROM;Decreased knowledge of use of DME;Strength    Plan  P: Attempt small wrist weight while completing functional reaching tasks. Focus on tasks/movements that patient reports are the most challenging at home.    Consulted and Agree with Plan of Care  Patient       Patient will benefit from skilled therapeutic intervention in order to improve the following deficits and impairments:   Body Structure / Function / Physical Skills: ADL, UE functional use, Fascial restriction, Pain, ROM, Decreased knowledge of use of DME, Strength       Visit Diagnosis: Other symptoms and signs involving the musculoskeletal system  Chronic right shoulder pain  Stiffness of right shoulder, not elsewhere classified    Problem List Patient Active Problem List  Diagnosis Date Noted  . History of adenomatous polyp of colon 02/27/2019  . Paroxysmal atrial fibrillation ([place]) 07/18/2015  . Bradycardia 06/29/2015  .  Near syncope   . Atrial fibrillation with rapid ventricular response ([place]) 06/27/2015  . Left ventricular diastolic dysfunction, NYHA class 3 10/26/2014  . Kidney cysts 04/04/2013  . HTN (hypertension) 03/31/2013  .  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 Diastolic dysfunction- grade 2- echo 06/28/15 03/31/2013  . RBBB 03/31/2013  . AV block, 1st degree 03/31/2013  . Sinus bradycardia 03/31/2013  . Hematochezia 12/29/2011   Ailene Ravel, OTR/L,CBIS  [phone]  07/12/2019, 11:52 AM  [place] [address] [place], [place], [address] Phone: [phone]   Fax:  [phone]  Name: Joseph [name] MRN: [id number] Date of Birth: 04/21/1946

## 2019-07-19 ENCOUNTER — Other Ambulatory Visit: Payer: Self-pay

## 2019-07-19 ENCOUNTER — Ambulatory Visit (HOSPITAL_COMMUNITY): Payer: Medicare PPO | Attending: Nurse Practitioner | Admitting: Occupational Therapy

## 2019-07-19 ENCOUNTER — Encounter (HOSPITAL_COMMUNITY): Payer: Self-pay | Admitting: Occupational Therapy

## 2019-07-19 DIAGNOSIS — R29898 Other symptoms and signs involving the musculoskeletal system: Secondary | ICD-10-CM | POA: Diagnosis present

## 2019-07-19 DIAGNOSIS — M25611 Stiffness of right shoulder, not elsewhere classified: Secondary | ICD-10-CM

## 2019-07-19 DIAGNOSIS — G8929 Other chronic pain: Secondary | ICD-10-CM

## 2019-07-19 DIAGNOSIS — M25511 Pain in right shoulder: Secondary | ICD-10-CM | POA: Insufficient documentation

## 2019-07-19 NOTE — Therapy (Signed)
[place], [place], [address] Phone: [phone]   Fax:  [phone]  Occupational Therapy Treatment  Patient Details  Name: Joseph [name] MRN: [id number] Date of Birth: [birth year] Referring Provider (OT): Carlis Abbott, NP   Encounter Date: 07/19/2019  OT End of Session - 07/19/19 1115    Visit Number  12    Number of Visits  14    Date for OT Re-Evaluation  07/28/19    Authorization Type  Double coverage: [place] No visit limit or authorization needed.    Authorization Time Period  Complete progress note on visit 62    Authorization - Visit Number  12    Authorization - Number of Visits  18    OT Start Time  3009   pt arrived late   OT Stop Time  1115    OT Time Calculation (min)  36 min    Activity Tolerance  Patient tolerated treatment well    Behavior During Therapy  WFL for tasks assessed/performed       Past Medical History:  Diagnosis Date  . 1st degree AV block   . Diastolic dysfunction 2/33   grade 1  . Hypertension   . New onset atrial fibrillation ([place]) 06/2015  . RBBB   . Sinus bradycardia   . Stroke [facility])    no defecits, pt denies having a stroke, says [name] diagnosed this    Past Surgical History:  Procedure Laterality Date  . COLONOSCOPY    . COLONOSCOPY  12/30/2011   Procedure: COLONOSCOPY;  Surgeon: [name], MD;  [facility];  Service: Endoscopy;  Laterality: N/A;  1:45PM  . COLONOSCOPY N/A 04/19/2019   Procedure: COLONOSCOPY;  Surgeon: [name], MD;  [facility];  Service: Endoscopy;  Laterality: N/A;  2:00pm  . EXCISION MASS LOWER EXTREMETIES Left 05/12/2018   Procedure: Left foot plantar fibroma excision;  Surgeon: [name], MD;  Location: [place];  Service: Orthopedics;  Laterality: Left;    There were no vitals filed for this visit.  Subjective Assessment - 07/19/19 1039    Subjective   S: I'm only sore when I reach back.    Currently in Pain?  Yes    Pain Score  2     Pain Location  Shoulder    Pain Orientation  Right    Pain Descriptors / Indicators  Sore    Pain Type  Chronic pain    Pain Radiating Towards  n/a    Pain Onset  More than a month ago    Pain Frequency  Constant    Aggravating Factors   increased use and movement    Pain Relieving Factors  resting in a comfortable position    Effect of Pain on Daily Activities  none    Multiple Pain Sites  No         [facility] OT Assessment - 07/19/19 1039      Assessment   Medical Diagnosis  right shoulder pain      Precautions   Precautions  None               OT Treatments/Exercises (OP) - 07/19/19 1040      Exercises   Exercises  Shoulder      Shoulder Exercises: Supine   Protraction  PROM;5 reps    Horizontal ABduction  PROM;5 reps    External Rotation  PROM;5 reps    Internal Rotation  PROM;5 reps    Flexion  PROM;5 reps    ABduction  PROM;5 reps      Shoulder Exercises: Prone   Other Prone Exercises  bent over at waist: tricep extension, bent over row, 10X, 2#       Shoulder Exercises: Standing   Protraction  Strengthening;10 reps    Protraction Weight (lbs)  2    Horizontal ABduction  Strengthening;10 reps    Horizontal ABduction Weight (lbs)  2    External Rotation  Strengthening;10 reps    External Rotation Weight (lbs)  2    Internal Rotation  Strengthening;10 reps    Internal Rotation Weight (lbs)  2    Flexion  Strengthening;10 reps    Shoulder Flexion Weight (lbs)  2    ABduction  Strengthening;10 reps    Shoulder ABduction Weight (lbs)  2      Shoulder Exercises: Therapy Ball   Other Therapy Ball Exercises  green therapy ball: chest press, overhead press, flexion, circles each direction, 12X each      Shoulder Exercises: ROM/Strengthening   X to V Arms  10X, 1#     Ball on Wall  1' flexion 1' abduction      Functional Reaching Activities   High Level   Pt wearing 1# wrist weight, placing (flexion) and removing (abduction) 10 cones from top shelf of cabinet. No difficulty.                OT Short Term Goals - 07/12/19 0915      OT SHORT TERM GOAL #1   Title  Patient will be educated and independent with HEP in order to faciliate his progress in therapy and allow him to return to using his right UE more functionally and with less pain when reaching.    Time  1    Period  Weeks    Target Date  06/30/19      OT SHORT TERM GOAL #2   Title  Patient will increase RUE A/ROM to [facility] in order to increase the ability to reach above shoulder for items as needed.    Time  4    Period  Weeks      OT SHORT TERM GOAL #3   Title  Patient will increase RUE shoulder strength to 5/5 to allow him greater stability in the shoulder and scapular region when lifting and carrying items.    Time  4    Period  Weeks    Status  Partially Met      OT SHORT TERM GOAL #4   Title  Patient will decrease fascial restrictions to trace amount or less in order to increase functional mobility needed to complete reaching tasks using his RUE.    Time  4    Period  Weeks    Status  On-going      OT SHORT TERM GOAL #5   Title  Patient will report a pain level of no greater than 2/10 when completing basic daily tasks using his RUE.    Time  4    Period  Weeks    Status  On-going               Plan - 07/19/19 1055    Clinical Impression Statement  A: Session focusing on RUE strengthening and stabilization today. Continued with 2# weights and added 1# weight to x to v arms. Occasional rest breaks provided for fatigue. Pt  reports reaching back is the most difficult motion, added tricep extension and bent over row today with minimal soreness. Completed functional reaching with wrist weight, min difficutly. Verbal cuing for form and technique.    Body Structure / Function / Physical Skills  ADL;UE functional use;Fascial restriction;Pain;ROM;Decreased  knowledge of use of DME;Strength    Plan  P: Reassessment, FOTO, discharge with HEP       Patient will benefit from skilled therapeutic intervention in order to improve the following deficits and impairments:   Body Structure / Function / Physical Skills: ADL, UE functional use, Fascial restriction, Pain, ROM, Decreased knowledge of use of DME, Strength       Visit Diagnosis: Other symptoms and signs involving the musculoskeletal system  Chronic right shoulder pain  Stiffness of right shoulder, not elsewhere classified    Problem List Patient Active Problem List   Diagnosis Date Noted  . History of adenomatous polyp of colon 02/27/2019  . Paroxysmal atrial fibrillation ([place]) 07/18/2015  . Bradycardia 06/29/2015  . Near syncope   . Atrial fibrillation with rapid ventricular response ([place]) 06/27/2015  . Left ventricular diastolic dysfunction, NYHA class 3 10/26/2014  . Kidney cysts 04/04/2013  . HTN (hypertension) 03/31/2013  .  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 Diastolic dysfunction- grade 2- echo 06/28/15 03/31/2013  . RBBB 03/31/2013  . AV block, 1st degree 03/31/2013  . Sinus bradycardia 03/31/2013  . Hematochezia 12/29/2011   Guadelupe Sabin, OTR/L  [phone] 07/19/2019, 11:15 AM  [place] [address] [place], [place], [address] Phone: [phone]   Fax:  [phone]  Name: Joseph [name] MRN: [id number] Date of Birth: 1945-08-23

## 2019-07-26 ENCOUNTER — Ambulatory Visit (HOSPITAL_COMMUNITY): Payer: Medicare PPO | Admitting: Occupational Therapy

## 2019-07-26 ENCOUNTER — Other Ambulatory Visit: Payer: Self-pay

## 2019-07-26 ENCOUNTER — Encounter (HOSPITAL_COMMUNITY): Payer: Self-pay | Admitting: Occupational Therapy

## 2019-07-26 DIAGNOSIS — R29898 Other symptoms and signs involving the musculoskeletal system: Secondary | ICD-10-CM

## 2019-07-26 DIAGNOSIS — G8929 Other chronic pain: Secondary | ICD-10-CM

## 2019-07-26 DIAGNOSIS — M25611 Stiffness of right shoulder, not elsewhere classified: Secondary | ICD-10-CM

## 2019-07-26 NOTE — Therapy (Signed)
[place], [place], [address] Phone: [phone]   Fax:  [phone]  Occupational Therapy Reassessment, Treatment, Discharge Summary   Patient Details  Name: Joseph [name] MRN: [id number] Date of Birth: [birth year] Referring Provider (OT): Carlis Abbott, NP  Progress Note Reporting Period 07/03/2019 to 07/26/2019  See note below for Objective Data and Assessment of Progress/Goals.       Encounter Date: 07/26/2019  OT End of Session - 07/26/19 1241    Visit Number  13    Number of Visits  14    Date for OT Re-Evaluation  07/28/19    Authorization Type  Double coverage: [place] No visit limit or authorization needed.    Authorization Time Period  Complete progress note on visit 13    Authorization - Visit Number  13    Authorization - Number of Visits  18    OT Start Time  [phone]    OT Stop Time  1025    OT Time Calculation (min)  38 min    Activity Tolerance  Patient tolerated treatment well    Behavior During Therapy  WFL for tasks assessed/performed       Past Medical History:  Diagnosis Date  . 1st degree AV block   . Diastolic dysfunction 3/73   grade 1  . Hypertension   . New onset atrial fibrillation ([place]) 06/2015  . RBBB   . Sinus bradycardia   . Stroke [facility])    no defecits, pt denies having a stroke, says [name] diagnosed this    Past Surgical History:  Procedure Laterality Date  . COLONOSCOPY    . COLONOSCOPY  12/30/2011   Procedure: COLONOSCOPY;  Surgeon: [name], MD;  [facility];  Service: Endoscopy;  Laterality: N/A;  1:45PM  . COLONOSCOPY N/A 04/19/2019   Procedure: COLONOSCOPY;  Surgeon: [name], MD;  [facility];  Service: Endoscopy;  Laterality: N/A;  2:00pm  . EXCISION MASS LOWER EXTREMETIES Left 05/12/2018   Procedure: Left foot plantar fibroma excision;  Surgeon: [name], MD;  Location: [place];  Service: Orthopedics;  Laterality: Left;    There were no vitals filed for this visit.  Subjective Assessment - 07/26/19 0947    Subjective   S: It isn't as traumatic towards my pain threshold as it used to be.    Currently in Pain?  Yes    Pain Score  2     Pain Location  Shoulder    Pain Orientation  Right    Pain Descriptors / Indicators  Sore    Pain Type  Chronic pain    Pain Radiating Towards  n/a    Pain Onset  More than a month ago    Pain Frequency  Intermittent    Aggravating Factors   increased use and overly stretching    Pain Relieving Factors  resting in a comfortable position    Effect of Pain on Daily Activities  none    Multiple Pain Sites  No         [facility] OT Assessment - 07/26/19 0947      Assessment   Medical Diagnosis  right shoulder pain      Precautions   Precautions  None      Observation/Other Assessments   Focus on Therapeutic Outcomes (FOTO)   63/100   60/100 previous     Palpation  Palpation comment  trace fascial restrictions in the right upper arm, trapezius, and scapularis region.      AROM   Overall AROM Comments  Assessed seated. IR/er adducted    AROM Assessment Site  Shoulder    Right/Left Shoulder  Right    Right Shoulder Flexion  160 Degrees   155 previous   Right Shoulder ABduction  145 Degrees   144 previous   Right Shoulder Internal Rotation  90 Degrees   same as previous   Right Shoulder External Rotation  55 Degrees   same as previous     PROM   Overall PROM Comments  Assessed supine. IR/er adducted    PROM Assessment Site  Shoulder    Right/Left Shoulder  Right    Right Shoulder Flexion  150 Degrees   145 previous   Right Shoulder ABduction  180 Degrees   same as previous   Right Shoulder Internal Rotation  90 Degrees   same as previous   Right Shoulder External Rotation  62 Degrees   60 previous     Strength   Overall Strength Comments  Assessed seated. IR/er adducted    Strength  Assessment Site  Shoulder    Right/Left Shoulder  Right    Right Shoulder Flexion  5/5   4/5 previous   Right Shoulder ABduction  4+/5   4-/5 previous   Right Shoulder Internal Rotation  5/5   same as previous   Right Shoulder External Rotation  4+/5   same as previous              OT Treatments/Exercises (OP) - 07/26/19 0951      Exercises   Exercises  Shoulder      Shoulder Exercises: Supine   Protraction  PROM;5 reps    Horizontal ABduction  PROM;5 reps    External Rotation  PROM;5 reps    Internal Rotation  PROM;5 reps    Flexion  PROM;5 reps    ABduction  PROM;5 reps      Shoulder Exercises: Seated   Horizontal ABduction  Theraband;10 reps    Theraband Level (Shoulder Horizontal ABduction)  Level 2 (Red)    External Rotation  Theraband;10 reps    Theraband Level (Shoulder External Rotation)  Level 2 (Red)    Internal Rotation  Theraband;10 reps    Theraband Level (Shoulder Internal Rotation)  Level 2 (Red)    Diagonals  Theraband;10 reps    Theraband Level (Shoulder Diagonals)  Level 2 (Red)      Shoulder Exercises: Stretch   Cross Chest Stretch  2 reps;10 seconds    Internal Rotation Stretch  2 reps   10" horizontal towel   External Rotation Stretch  2 reps;10 seconds    Wall Stretch - Flexion  2 reps;10 seconds    Other Shoulder Stretches  doorway stretch, 2 reps, 10" holds             OT Education - 07/26/19 1006    Education Details  provided additional copy of shoulder stretches; updated HEP to red theraband strengthening    Person(s) Educated  Patient    Methods  Explanation;Demonstration;Handout    Comprehension  Verbalized understanding;Returned demonstration       OT Short Term Goals - 07/26/19 1003      OT SHORT TERM GOAL #1   Title  Patient will be educated and independent with HEP in order to faciliate his progress in therapy and allow him to return to using his  right UE more functionally and with less pain when reaching.     Time  1    Period  Weeks    Target Date  06/30/19      OT SHORT TERM GOAL #2   Title  Patient will increase RUE A/ROM to [facility] in order to increase the ability to reach above shoulder for items as needed.    Time  4    Period  Weeks      OT SHORT TERM GOAL #3   Title  Patient will increase RUE shoulder strength to 5/5 to allow him greater stability in the shoulder and scapular region when lifting and carrying items.    Time  4    Period  Weeks    Status  Partially Met      OT SHORT TERM GOAL #4   Title  Patient will decrease fascial restrictions to trace amount or less in order to increase functional mobility needed to complete reaching tasks using his RUE.    Time  4    Period  Weeks    Status  Achieved      OT SHORT TERM GOAL #5   Title  Patient will report a pain level of no greater than 2/10 when completing basic daily tasks using his RUE.    Time  4    Period  Weeks    Status  Achieved               Plan - 07/26/19 1241    Clinical Impression Statement  A: Reassessment completed this session, pt has met 4/5 goals with remaining goal partially met. Pt has improved functional use of the RUE and reports he is using during ADLs with minimal discomfort, occasional pain with end ROM but immediately resolves when he stops the motion. Pt educated on chronic pain of the shoulder and potential limiting factors, pt inquiring on [facility] to the [facility]. Pt educated on updated HEP and reviewed shoulde stretches. Pt is agreeable to discharge today.    Body Structure / Function / Physical Skills  ADL;UE functional use;Fascial restriction;Pain;ROM;Decreased knowledge of use of DME;Strength    Plan  P: Discharge pt       Patient will benefit from skilled therapeutic intervention in order to improve the following deficits and impairments:   Body Structure / Function / Physical Skills: ADL, UE functional use, Fascial restriction, Pain, ROM, Decreased knowledge of use of DME, Strength        Visit Diagnosis: Other symptoms and signs involving the musculoskeletal system  Chronic right shoulder pain  Stiffness of right shoulder, not elsewhere classified    Problem List Patient Active Problem List   Diagnosis Date Noted  . History of adenomatous polyp of colon 02/27/2019  . Paroxysmal atrial fibrillation ([place]) 07/18/2015  . Bradycardia 06/29/2015  . Near syncope   . Atrial fibrillation with rapid ventricular response ([place]) 06/27/2015  . Left ventricular diastolic dysfunction, NYHA class 3 10/26/2014  . Kidney cysts 04/04/2013  . HTN (hypertension) 03/31/2013  .  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 Diastolic dysfunction- grade 2- echo 06/28/15 03/31/2013  . RBBB 03/31/2013  . AV block, 1st degree 03/31/2013  . Sinus bradycardia 03/31/2013  . Hematochezia 12/29/2011   Guadelupe Sabin, OTR/L  [phone] 07/26/2019, 12:44 PM  [place] [address] [place], [place], [address] Phone: [phone]   Fax:  [phone]  Name: TYRESS LODEN MRN: [id number] Date of Birth:  Apr 23, 1946   OCCUPATIONAL THERAPY DISCHARGE SUMMARY  Visits from Start of Care: 13  Current functional level related to goals / functional outcomes: See above. Pt is using RUE during ADLs and functional tasks with minimal difficulty or discomfort. Is able to sleep without waking up due to arm pain.    Remaining deficits: Strength and activity tolerance deficits limiting ability to chop wood or use chainsaw   Education / Equipment: HEP for RUE strengthening  Plan: Patient agrees to discharge.  Patient goals were met. Patient is being discharged due to meeting the stated rehab goals.  ?????

## 2019-07-26 NOTE — Patient Instructions (Signed)
Strengthening: Chest Pull - Resisted   Hold Theraband in front of body with hands about shoulder width a part. Pull band a part and back together slowly. Repeat _10___ times. Complete _1___ set(s) per session.. Repeat _1___ session(s) per day.   PNF Strengthening: Resisted   Standing with resistive band around each hand, bring right arm up and away, thumb back. Repeat __10__ times per set. Do ___1_ sets per session. Do _1___ sessions per day.                           Resisted External Rotation: in Neutral - Bilateral   Sit or stand, tubing in both hands, elbows at sides, bent to 90, forearms forward. Pinch shoulder blades together and rotate forearms out. Keep elbows at sides. Repeat __10__ times per set. Do __1__ sets per session. Do __1__ sessions per day.  1  PNF Strengthening: Resisted   Standing, hold resistive band above head. Bring right arm down and out from side. Repeat __10__ times per set. Do __1__ sets per session. Do __1__ sessions per day.     1) Flexion Wall Stretch    Face wall, place affected handon wall in front of you. Slide hand up the wall  and lean body in towards the wall. Hold for 10 seconds. Repeat 3-5 times. 1-2 times/day.     2) Towel Stretch with Internal Rotation       Gently pull up (or to the side) your affected arm  behind your back with the assist of a towel. Hold 10 seconds, repeat 3-5 times. 1-2 times/day.             3) Corner Stretch    Stand at a corner of a wall, place your arms on the walls with elbows bent. Lean into the corner until a stretch is felt along the front of your chest and/or shoulders. Hold for 10 seconds. Repeat 3-5X, 1-2 times/day.    4) Posterior Capsule Stretch    Bring the involved arm across chest. Grasp elbow and pull toward chest until you feel a stretch in the back of the upper arm and shoulder. Hold 10 seconds. Repeat 3-5X. Complete 1-2 times/day.      5)  External Rotation Stretch:     Place your affected hand on the wall with the elbow bent and gently turn your body the opposite direction until a stretch is felt. Hold 10 seconds, repeat 3-5X. Complete 1-2 times/day.

## 2019-08-10 ENCOUNTER — Encounter (INDEPENDENT_AMBULATORY_CARE_PROVIDER_SITE_OTHER): Payer: Self-pay

## 2019-08-10 ENCOUNTER — Ambulatory Visit (INDEPENDENT_AMBULATORY_CARE_PROVIDER_SITE_OTHER): Payer: Medicare PPO | Admitting: Cardiovascular Disease

## 2019-08-10 ENCOUNTER — Other Ambulatory Visit: Payer: Self-pay

## 2019-08-10 VITALS — BP 175/80 | Temp 97.5°F | Ht 70.0 in | Wt 211.0 lb

## 2019-08-10 DIAGNOSIS — R001 Bradycardia, unspecified: Secondary | ICD-10-CM

## 2019-08-10 DIAGNOSIS — Z7901 Long term (current) use of anticoagulants: Secondary | ICD-10-CM

## 2019-08-10 DIAGNOSIS — I480 Paroxysmal atrial fibrillation: Secondary | ICD-10-CM | POA: Diagnosis not present

## 2019-08-10 DIAGNOSIS — I519 Heart disease, unspecified: Secondary | ICD-10-CM

## 2019-08-10 DIAGNOSIS — R350 Frequency of micturition: Secondary | ICD-10-CM | POA: Diagnosis not present

## 2019-08-10 DIAGNOSIS — I10 Essential (primary) hypertension: Secondary | ICD-10-CM

## 2019-08-10 DIAGNOSIS — I5189 Other ill-defined heart diseases: Secondary | ICD-10-CM

## 2019-08-10 NOTE — Progress Notes (Signed)
Patient ID: ROSHAD HACK, male   DOB: 12-16-1945, 74 y.o.   MRN: [id number]   Primary M.D.: Dr. Newt Minion  HPI: LAVAUGHN HABERLE is a 74 y.o. male who presents to the office for a 9 month cardiology evaluation.  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 He has a long-standing history of hypertension. A CT scan in 2013 suggested small lacunar infarctions most likely due to hypertension. I initially saw in April 2013 after this CT imaging study was done. He has documented normal systolic function with mild concentric LVH with diastolic dysfunction. An echo Doppler study in April 2013 revealed his mitral valve E/A ratio is 3.8 suggestive of restrictive physiology. He has documented renal cysts involving the left kidney showing a large inferior pole cortical cyst is measured 4.5 x 4.7 x 4.4 cm. In addition there was a second area at the mid pole laterally which appeared multi-septated and measured 2.7 x 2.7 x 1.9. I  recommended urologic evaluation which he did at [facility] and he was told that these were benign.  Mr. Clasby has a history of moderate obesity.  In the past.  He had weight as high as 250 pounds.  Over the past several years.  His weight has been in the 220s.  When I last saw him, he was walking 3 miles at a time 3 days per week and denied any exertional chest pain or palpitations.   Over the past several years, he has been seen by [name] and also saw [name], [place] for blood pressure management.  He last saw [name] on 03/18/2017, who switched losartan to irbesartan.  His blood pressure medications now include amlodipine 10 mg, irbesartan 300 mg, doxazosin 8 mg,minoxidil 5 mg, and Toprol, which she has been taking 25 mg as needed, but has not been taking this regularly.  He denies any episodes of chest pressure.  His last echo Doppler study was in December 2016 which showed mild LVH, EF 50-55% with grade 2 diastolic dysfunction.    When I  saw him in October 2018 he complained of fatigue and had noted a slow pulse.  He was bradycardic with heart rates in the 40s and I discontinued Toprol.  He was hypertensive and I recommended initiation of hydralazine 25 mg twice a day.  I scheduled him for renal Doppler studies to make certain there was no renal vascular etiology to his hypertension on multiple medical drug regimen.  Renal Doppler study did not demonstrate any evidence for renal artery stenosis.  He again was noted to have previously documented renal cysts with the right upper pole cyst at 1.5 x 1.7 cm, and a left exophytic lower pole cyst at 4.5 x 5.4 cm and left upper pole striated cyst at 3.6 x 2.5 cm.  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  He denies any abdominal complaints.  He had stopped taking hydralazine when he called the office in December 2018 due to feeling that he was having lower extremity edema.  He also felt that the hydralazine was contributing to his fatigue.  His edema has improved with diabetic socks.    I saw him in January 2019 at which time he was bradycardic with a heart rate at 47 bpm.  He was unaware of any recurrent atrial fibrillation episodes and continue to be on Eliquis 5 mg twice a day.  His blood pressure was controlled on amlodipine 10 mg, Cardura 4 mg at bedtime, Erbe Sartain 300 mg, in addition to minoxidil 5 mg daily.  I saw him in January 2020 and at that time his blood pressure was elevated at 158/80.  With his previous history of grade 2 diastolic dysfunction I elected to add Spironolactone 12.5 mg to his regimen. Remotely I had placed him on hydralazine which he did not tolerate.  He was last evaluated by me in a telemedicine visit in April 2020.  He denied any chest pain or awareness of recurrent arrhythmia.  He states his blood pressure at times is up and down.  He apparently has only been taking the Spironolactone every  other day.  At times his blood pressure can reach 578 systolically.  Pulse 50.  He recently began to exercise more than he had previously and has been walking 3 miles at least 3 days/week.  He underwent a follow-up echo Doppler study in February 2020 which showed mild concentric LVH with EF 55 to 60%.  Diastolic function could not be evaluated due to poor image quality  Since his last evaluation, he states his blood pressure typically has been greater than 469 systolically but at times there have been some instances where it was in the 120s.  He denies dizziness or chest pain.  He has not had recent laboratory.  He presents for evaluation.  Past Medical History:  Diagnosis Date  . 1st degree AV block   . Diastolic dysfunction 6/29   grade 1  . Hypertension   . New onset atrial fibrillation ([place]) 06/2015  . RBBB   . Sinus bradycardia   . Stroke [facility])    no defecits, pt denies having a stroke, says [name] diagnosed this    Past Surgical History:  Procedure Laterality Date  . COLONOSCOPY    . COLONOSCOPY  12/30/2011   Procedure: COLONOSCOPY;  Surgeon: [name], MD;  [facility];  Service: Endoscopy;  Laterality: N/A;  1:45PM  . COLONOSCOPY N/A 04/19/2019   Procedure: COLONOSCOPY;  Surgeon: [name], MD;  [facility];  Service: Endoscopy;  Laterality: N/A;  2:00pm  . EXCISION MASS LOWER EXTREMETIES Left 05/12/2018   Procedure: Left foot plantar fibroma excision;  Surgeon: [name], MD;  Location: [place];  Service: Orthopedics;  Laterality: Left;    No Known Allergies  Current Outpatient Medications  Medication Sig Dispense Refill  . amLODipine (NORVASC) 10 MG tablet Take 1 tablet (10 mg total) by mouth daily. (Patient taking differently: Take 10 mg by mouth every evening. ) 90 tablet 1  . apixaban (ELIQUIS) 5 MG TABS tablet Take 1 tablet (5 mg total) by mouth 2 (two) times daily. 84 tablet 0  . cholecalciferol (VITAMIN D) 25  MCG (1000 UT) tablet Take 1,000 Units by mouth daily.    . Coenzyme Q10 (COQ10) 200 MG CAPS Take 200 mg by mouth daily.    Marland Kitchen doxazosin (CARDURA) 8 MG tablet Take 8 mg by mouth at bedtime.     . irbesartan (AVAPRO) 300 MG tablet TAKE 1 TABLET BY MOUTH DAILY 90 tablet 2  . minoxidil (LONITEN) 2.5 MG tablet Take 5 mg by mouth daily.     . Misc Natural Products (PROSTATE HEALTH PO) Take 1 capsule by mouth daily. Super Beta Prostate    . Multiple Vitamin (MULTIVITAMIN WITH MINERALS) TABS tablet Take 1 tablet by mouth daily.    Vladimir Faster  Glycol-Propyl Glycol (LUBRICANT EYE DROPS) 0.4-0.3 % SOLN Place 1 drop into both eyes 3 (three) times daily as needed (dry/irritated eyes.).    Marland Kitchen polyethylene glycol-electrolytes (TRILYTE) 420 g solution Take 4,000 mLs by mouth as directed. 4000 mL 0  . saw palmetto 500 MG capsule Take 500 mg by mouth every evening.    Marland Kitchen spironolactone (ALDACTONE) 25 MG tablet Take 0.5 tablets (12.5 mg total) by mouth daily. 60 tablet 1  . Turmeric 500 MG CAPS Take 500 mg by mouth daily.     No current facility-administered medications for this visit.    Social History   Socioeconomic History  . Marital status: Married    Spouse name: Not on file  . Number of children: Not on file  . Years of education: Not on file  . Highest education level: Not on file  Occupational History  . Not on file  Tobacco Use  . Smoking status: Former Smoker    Packs/day: 0.00    Years: 4.00    Pack years: 0.00  . Smokeless tobacco: Never Used  . Tobacco comment: quit about 40 yrs ago  Substance and Sexual Activity  . Alcohol use: Yes    Alcohol/week: 0.0 standard drinks    Comment: 6 pack of beer or less in a week  . Drug use: No  . Sexual activity: Not on file  Other Topics Concern  . Not on file  Social History Narrative  . Not on file   Social Determinants of Health   Financial Resource Strain:   . Difficulty of Paying Living Expenses: Not on file  Food Insecurity:   .  Worried About Charity fundraiser in the Last Year: Not on file  . Ran Out of Food in the Last Year: Not on file  Transportation Needs:   . Lack of Transportation (Medical): Not on file  . Lack of Transportation (Non-Medical): Not on file  Physical Activity:   . Days of Exercise per Week: Not on file  . Minutes of Exercise per Session: Not on file  Stress:   . Feeling of Stress : Not on file  Social Connections:   . Frequency of Communication with Friends and Family: Not on file  . Frequency of Social Gatherings with Friends and Family: Not on file  . Attends Religious Services: Not on file  . Active Member of Clubs or Organizations: Not on file  . Attends Archivist Meetings: Not on file  . Marital Status: Not on file  Intimate Partner Violence:   . Fear of Current or Ex-Partner: Not on file  . Emotionally Abused: Not on file  . Physically Abused: Not on file  . Sexually Abused: Not on file    Family History  Problem Relation Age of Onset  . Cancer Brother   . CVA Mother   . Hypertension Father   . Colon cancer Neg Hx     ROS General: Negative; No fevers, chills, or night sweats;  HEENT: Negative; No changes in vision or hearing, sinus congestion, difficulty swallowing Pulmonary: Negative; No cough, wheezing, shortness of breath, hemoptysis Cardiovascular: Negative; No chest pain, presyncope, syncope, palpitations GI: Negative; No nausea, vomiting, diarrhea, or abdominal pain GU: History of renal cysts Musculoskeletal: Negative; no myalgias, joint pain, or weakness Hematologic/Oncology: Negative; no easy bruising, bleeding Endocrine: Negative; no heat/cold intolerance; no diabetes Neuro: Negative; no changes in balance, headaches Skin: Negative; No rashes or skin lesions Psychiatric: Negative; No behavioral problems, depression Sleep: Negative;  No snoring, daytime sleepiness, hypersomnolence, bruxism, restless legs, hypnogognic hallucinations, no  cataplexy Other comprehensive 14 point system review is negative.   PE BP (!) 175/80   Temp (!) 97.5 F (36.4 C)   Ht '5\' 10"'  (1.778 m)   Wt 211 lb (95.7 kg)   SpO2 100%   BMI 30.28 kg/m    Repeat blood pressure by me was 128/80 supine and 130/80 standing  Wt Readings from Last 3 Encounters:  08/10/19 211 lb (95.7 kg)  04/19/19 219 lb (99.3 kg)  02/27/19 208 lb (94.3 kg)   General: Alert, oriented, no distress.  Skin: normal turgor, no rashes, warm and dry HEENT: Normocephalic, atraumatic. Pupils equal round and reactive to light; sclera anicteric; extraocular muscles intact;  Nose without nasal septal hypertrophy Mouth/Parynx benign; Mallinpatti scale 3 Neck: No JVD, no carotid bruits; normal carotid upstroke Lungs: clear to ausculatation and percussion; no wheezing or rales Chest wall: without tenderness to palpitation Heart: PMI not displaced, RRR, s1 s2 normal, 1/6 systolic murmur, no diastolic murmur, no rubs, gallops, thrills, or heaves Abdomen: soft, nontender; no hepatosplenomehaly, BS+; abdominal aorta nontender and not dilated by palpation. Back: no CVA tenderness Pulses 2+ Musculoskeletal: full range of motion, normal strength, no joint deformities Extremities: no clubbing cyanosis or edema, Homan's sign negative  Neurologic: grossly nonfocal; Cranial nerves grossly wnl Psychologic: Normal mood and affect   ECG (independently read by me): Bradycardia at 47 bpm with junctional rhyhm, intermittent P waves  August 09, 2018 ECG (independently read by me): Sinus bradycardia at 65 bpm with sinus arrhythmia, PACs, first-degree AV block with a PR interval '2 6 6 ' ms, right bundle branch block with repolarization changes, left anterior hemiblock.  Septal Q wave V1.  July 21, 2017 ECG (independently read by me): Sinus bradycardia at 47 bpm.  First-degree AV block with a PR interval of 222 ms.  Right bundle branch block with repolarization changes.  PAC.  October 2018 ECG  (independently read by me): Sinus bradycardia at 47 bpm. Accelerated AV conduction with a PR interval at 88 ms.  Right bundle-branch block with repolarization changes.  However, question blocked PACs every other beat.  April 2016 ECG (independently read by me): Sinus bradycardia at 54 bpm with right bundle branch block.  PAC.  QTc interval 479 msec.  PR interval 196 ms.  September 2014 ECG: Sinus bradycardia with first-degree AV block. Heart rate 46 beats per minute. Right bundle branch block, unchanged.  LABS: BMP Latest Ref Rng & Units 08/12/2018 04/20/2017 06/28/2015  Glucose 65 - 99 mg/dL 95 96 110(H)  BUN 8 - 27 mg/dL '13 14 19  ' Creatinine 0.76 - 1.27 mg/dL 1.06 1.04 1.15  BUN/Creat Ratio 10 - '24 12 13 ' -  Sodium 134 - 144 mmol/L 140 142 138  Potassium 3.5 - 5.2 mmol/L 4.7 4.6 3.6  Chloride 96 - 106 mmol/L 102 106 108  CO2 20 - 29 mmol/L '23 24 24  ' Calcium 8.6 - 10.2 mg/dL 9.4 9.1 8.2(L)   Hepatic Function Latest Ref Rng & Units 08/12/2018 04/20/2017 06/28/2015  Total Protein 6.0 - 8.5 g/dL 6.8 7.2 5.3(L)  Albumin 3.8 - 4.8 g/dL 4.2 4.1 3.0(L)  AST 0 - 40 IU/L '25 26 22  ' ALT 0 - 44 IU/L '19 19 18  ' Alk Phosphatase 39 - 117 IU/L 99 109 69  Total Bilirubin 0.0 - 1.2 mg/dL 1.2 1.0 1.1   CBC Latest Ref Rng & Units 08/12/2018 04/20/2017 06/29/2015  WBC 3.4 - 10.8 x10E3/uL  3.6 4.3 5.0  Hemoglobin 13.0 - 17.7 g/dL 12.4(L) 12.7(L) 11.4(L)  Hematocrit 37.5 - 51.0 % 40.3 38.1 34.6(L)  Platelets 150 - 450 x10E3/uL 236 242 163   Lab Results  Component Value Date   MCV 70 (L) 08/12/2018   MCV 66 (L) 04/20/2017   MCV 68.0 (L) 06/29/2015   Lab Results  Component Value Date   TSH 1.530 08/12/2018   No results found for: HGBA1C   Lipid Panel     Component Value Date/Time   CHOL 158 08/12/2018 1136   TRIG 44 08/12/2018 1136   HDL 79 08/12/2018 1136   CHOLHDL 2.0 08/12/2018 1136   LDLCALC 70 08/12/2018 1136    RADIOLOGY: No results found.  IMPRESSION:  1. Essential hypertension   2.  Paroxysmal atrial fibrillation (HCC)   3. Anticoagulation adequate   4. Bradycardia   5. Grade II diastolic dysfunction   6. Urine frequency    ASSESSMENT AND PLAN: Mr. Kubitz is 74 year old African American male who has a history of hypertension and CT evidence for small lacunar infarctions most likely of hypertensive etiology. There also is a remote history of rheumatic fever age 64. He had developed transient symptoms of right arm weakness with numbness in August 2012 which led to his MRI of his head which showed scattered small white matter hyper intensities bilaterally with also involvement in the right putamen, bilateral thalamus the also he has some additional changes in the pons, right and left inferior cerebellum.  A prior echo Doppler study has shown restrictive physiology.  His last echo Doppler study in December 2016 showed an EF of 50-55%, LVH, and grade 2 diastolic dysfunction.  There was mild biatrial enlargement.    He has had issues in the past with bradycardia leading to discontinuance of his metoprolol.  On his ECG today he is bradycardic with a rate at 49.  There are periods where it appears that he is in a junctional rhythm.  Other times there P waves with short PR intervals.  He has had blood pressure lability and his blood pressure was significantly elevated systolically on arrival.  However when rechecked by me his blood pressure was stable without orthostatic change.  He continues to be on amlodipine 10 mg, irbesartan 300 mg, doxazosin 8 mg at bedtime, spironolactone 12.5 mg daily in addition to minoxidil 2.5 mg.  He continues to be on Eliquis for anticoagulation.  There is no bleeding.  His last echo Doppler study in February 2020 showed an EF of 55 to 60% with mild left ventricular hypertrophy and mild left atrial dilatation.  There was mild aortic sclerosis without stenosis.  He has not had recent laboratory and I am recommending a comprehensive metabolic panel, magnesium level,  TSH, CBC, lipid studies and he is requested that he also obtain a PSA level.  With his ECG findings, I am having him wear a 2-week event monitor or Zio patch.  He is asymptomatic with reference to dizziness chest pain or awareness of heart rate irregularity.  I will see him in 6 evaluation or sooner as needed  Time spent: 25 minutes Troy Sine, MD, [facility]  08/12/2019 2:32 PM

## 2019-08-10 NOTE — Patient Instructions (Signed)
Medication Instructions:  NO CHANGES *If you need a refill on your cardiac medications before your next appointment, please call your pharmacy*  Lab Work: CMET, TSH, LIPID, PSA, MAG, CBC FASTING LABS If you have labs (blood work) drawn today and your tests are completely normal, you will receive your results only by: Marland Kitchen MyChart Message (if you have MyChart) OR . A paper copy in the mail If you have any lab test that is abnormal or we need to change your treatment, we will call you to review the results.  Testing/Procedures: [facility] [place]- Long Term Monitor Instructions   Your physician has requested you wear your ZIO patch monitor_14_days.   This is a single patch monitor.  [employer] supplies one patch monitor per enrollment.  Additional stickers are not available.   Please do not apply patch if you will be having a Nuclear Stress Test, Echocardiogram, Cardiac CT, MRI, or Chest Xray during the time frame you would be wearing the monitor. The patch cannot be worn during these tests.  You cannot remove and re-apply the ZIO XT patch monitor.   Your ZIO patch monitor will be sent USPS Priority mail from [facility] directly to your home address. The monitor may also be mailed to a PO BOX if home delivery is not available.   It may take 3-5 days to receive your monitor after you have been enrolled.   Once you have received you monitor, please review enclosed instructions.  Your monitor has already been registered assigning a specific monitor serial # to you.   Applying the monitor   Shave hair from upper left chest.   Hold abrader disc by orange tab.  Rub abrader in 40 strokes over left upper chest as indicated in your monitor instructions.   Clean area with 4 enclosed alcohol pads .  Use all pads to assure are is cleaned thoroughly.  Let dry.   Apply patch as indicated in monitor instructions.  Patch will be place under collarbone on left side of chest with arrow pointing upward.   Rub  patch adhesive wings for 2 minutes.Remove white label marked "1".  Remove white label marked "2".  Rub patch adhesive wings for 2 additional minutes.   While looking in a mirror, press and release button in center of patch.  A small green light will flash 3-4 times .  This will be your only indicator the monitor has been turned on.     Do not shower for the first 24 hours.  You may shower after the first 24 hours.   Press button if you feel a symptom. You will hear a small click.  Record Date, Time and Symptom in the Patient Log Book.   When you are ready to remove patch, follow instructions on last 2 pages of Patient Log Book.  Stick patch monitor onto last page of Patient Log Book.   Place Patient Log Book in [place] box.  Use locking tab on box and tape box closed securely.  The [employer] and [employer] has [employer] on it.  Please place in mailbox as soon as possible.  Your physician should have your test results approximately 7 days after the monitor has been mailed back to [facility].   Call [facility] at [phone] if you have questions regarding your ZIO XT patch monitor.  Call them immediately if you see an orange light blinking on your monitor.   If your monitor falls off in less than 4 days  contact our Monitor department at [phone].  If your monitor becomes loose or falls off after 4 days call [employer] at [phone] for suggestions on securing your monitor.     Follow-Up: At [facility], you and your health needs are our priority.  As part of our continuing mission to provide you with exceptional heart care, we have created designated Provider Care Teams.  These Care Teams include your primary Cardiologist (physician) and Advanced Practice Providers (APPs -  Physician Assistants and Nurse Practitioners) who all work together to provide you with the care you need, when you need it.  Your next appointment:   6 week(s)  The format for your next  appointment:   In Person  Provider:   Shelva Majestic, MD

## 2019-08-12 ENCOUNTER — Encounter: Payer: Self-pay | Admitting: Cardiovascular Disease

## 2019-08-16 LAB — CBC
Hematocrit: 41.7 % (ref 37.5–51.0)
Hemoglobin: 13.2 g/dL (ref 13.0–17.7)
MCH: 22.2 pg — ABNORMAL LOW (ref 26.6–33.0)
MCHC: 31.7 g/dL (ref 31.5–35.7)
MCV: 70 fL — ABNORMAL LOW (ref 79–97)
Platelets: 242 10*3/uL (ref 150–450)
RBC: 5.94 x10E6/uL — ABNORMAL HIGH (ref 4.14–5.80)
RDW: 15 % (ref 11.6–15.4)
WBC: 4.2 10*3/uL (ref 3.4–10.8)

## 2019-08-16 LAB — COMPREHENSIVE METABOLIC PANEL
ALT: 14 IU/L (ref 0–44)
AST: 22 IU/L (ref 0–40)
Albumin/Globulin Ratio: 1.7 (ref 1.2–2.2)
Albumin: 4.4 g/dL (ref 3.7–4.7)
Alkaline Phosphatase: 114 IU/L (ref 39–117)
BUN/Creatinine Ratio: 15 (ref 10–24)
BUN: 15 mg/dL (ref 8–27)
Bilirubin Total: 1.1 mg/dL (ref 0.0–1.2)
CO2: 25 mmol/L (ref 20–29)
Calcium: 9.5 mg/dL (ref 8.6–10.2)
Chloride: 100 mmol/L (ref 96–106)
Creatinine, Ser: 1.01 mg/dL (ref 0.76–1.27)
GFR calc Af Amer: 86 mL/min/{1.73_m2} (ref >59)
GFR calc non Af Amer: 74 mL/min/{1.73_m2} (ref >59)
Globulin, Total: 2.6 g/dL (ref 1.5–4.5)
Glucose: 82 mg/dL (ref 65–99)
Potassium: 4.6 mmol/L (ref 3.5–5.2)
Sodium: 138 mmol/L (ref 134–144)
Total Protein: 7 g/dL (ref 6.0–8.5)

## 2019-08-16 LAB — LIPID PANEL
Chol/HDL Ratio: 2.3 ratio (ref 0.0–5.0)
Cholesterol, Total: 165 mg/dL (ref 100–199)
HDL: 73 mg/dL (ref >39)
LDL Chol Calc (NIH): 81 mg/dL (ref 0–99)
Triglycerides: 56 mg/dL (ref 0–149)
VLDL Cholesterol Cal: 11 mg/dL (ref 5–40)

## 2019-08-16 LAB — PSA: Prostate Specific Ag, Serum: 0.5 ng/mL (ref 0.0–4.0)

## 2019-08-16 LAB — TSH: TSH: 1.28 u[IU]/mL (ref 0.450–4.500)

## 2019-08-16 LAB — MAGNESIUM: Magnesium: 1.9 mg/dL (ref 1.6–2.3)

## 2019-09-05 ENCOUNTER — Encounter: Payer: Self-pay | Admitting: *Deleted

## 2019-09-05 ENCOUNTER — Telehealth: Payer: Self-pay

## 2019-09-05 NOTE — Telephone Encounter (Signed)
Called pt and reviewed lab results from Dr. Claiborne Billings, per MD "Labs stable except low MCV which has been persistent." Told pt MCV is the mean corpuscular volume which measures the average size of your blood cells. Notified pt no changes in therapy at this time. Pt asking if anything can be done to fix this.  Will route to MD for review. Pt also stated that he was supposed to receive a monitor for his heart and that he had not yet received this. Told pt I would reach out to the individual that coordinate these monitors. Pt agreeable. No other questions or concerns at this time.  Message sent to Markus Daft and Waymon Budge for zio monitor.

## 2019-09-05 NOTE — Progress Notes (Signed)
Patient ID: Joseph [name], male   DOB: 1946-02-05, 74 y.o.   MRN: [id number] Patient enrolled for [employer] to mail a 14 day ZIO XT long term holter monitor to his home.

## 2019-09-07 NOTE — Progress Notes (Signed)
Ask TK

## 2019-09-10 ENCOUNTER — Other Ambulatory Visit (INDEPENDENT_AMBULATORY_CARE_PROVIDER_SITE_OTHER): Payer: Medicare PPO

## 2019-09-10 DIAGNOSIS — R001 Bradycardia, unspecified: Secondary | ICD-10-CM

## 2019-09-10 DIAGNOSIS — R350 Frequency of micturition: Secondary | ICD-10-CM | POA: Diagnosis not present

## 2019-09-10 DIAGNOSIS — I10 Essential (primary) hypertension: Secondary | ICD-10-CM | POA: Diagnosis not present

## 2019-09-10 DIAGNOSIS — I480 Paroxysmal atrial fibrillation: Secondary | ICD-10-CM | POA: Diagnosis not present

## 2019-09-14 ENCOUNTER — Other Ambulatory Visit: Payer: Self-pay

## 2019-09-14 ENCOUNTER — Ambulatory Visit (INDEPENDENT_AMBULATORY_CARE_PROVIDER_SITE_OTHER): Payer: Medicare PPO | Admitting: General Surgery

## 2019-09-14 ENCOUNTER — Encounter: Payer: Self-pay | Admitting: General Surgery

## 2019-09-14 VITALS — BP 151/67 | HR 50 | Temp 98.1°F | Resp 12 | Ht 70.0 in | Wt 213.0 lb

## 2019-09-14 DIAGNOSIS — N5089 Other specified disorders of the male genital organs: Secondary | ICD-10-CM | POA: Diagnosis not present

## 2019-09-14 NOTE — Patient Instructions (Signed)

## 2019-09-15 NOTE — Progress Notes (Signed)
Joseph [name]; AS:[id number]; Apr 12, 1946   HPI Patient is a 74 year old black male who was referred to my care by Dr. Jomarie Longs for evaluation and treatment of an enlarging not in the right groin.  He states is been present for many months, but recently has increased in size and is causing him discomfort.  It is not made worse with straining.  It is tender to touch on occasion.  He denies any fever, chills, nausea, or vomiting.  He is currently wearing a heart monitor for work-up of new onset atrial fibrillation.  He is being followed by Dr. Claiborne Billings of cardiology in [place]. Past Medical History:  Diagnosis Date  . 1st degree AV block   . Diastolic dysfunction 123456   grade 1  . Hypertension   . New onset atrial fibrillation ([place]) 06/2015  . RBBB   . Sinus bradycardia   . Stroke [facility])    no defecits, pt denies having a stroke, says [name] diagnosed this    Past Surgical History:  Procedure Laterality Date  . COLONOSCOPY    . COLONOSCOPY  12/30/2011   Procedure: COLONOSCOPY;  Surgeon: [name], MD;  [facility];  Service: Endoscopy;  Laterality: N/A;  1:45PM  . COLONOSCOPY N/A 04/19/2019   Procedure: COLONOSCOPY;  Surgeon: [name], MD;  [facility];  Service: Endoscopy;  Laterality: N/A;  2:00pm  . EXCISION MASS LOWER EXTREMETIES Left 05/12/2018   Procedure: Left foot plantar fibroma excision;  Surgeon: [name], MD;  Location: [place];  Service: Orthopedics;  Laterality: Left;    Family History  Problem Relation Age of Onset  . Cancer Brother   . CVA Mother   . Hypertension Father   . Colon cancer Neg Hx     Current Outpatient Medications on File Prior to Visit  Medication Sig Dispense Refill  . amLODipine (NORVASC) 10 MG tablet Take 1 tablet (10 mg total) by mouth daily. (Patient taking differently: Take 10 mg by mouth every evening. ) 90 tablet 1  . apixaban (ELIQUIS) 5 MG TABS tablet Take 1 tablet (5 mg total) by mouth  2 (two) times daily. 84 tablet 0  . cholecalciferol (VITAMIN D) 25 MCG (1000 UT) tablet Take 1,000 Units by mouth daily.    . Coenzyme Q10 (COQ10) 200 MG CAPS Take 200 mg by mouth daily.    Marland Kitchen doxazosin (CARDURA) 8 MG tablet Take 8 mg by mouth at bedtime.     . irbesartan (AVAPRO) 300 MG tablet TAKE 1 TABLET BY MOUTH DAILY 90 tablet 2  . minoxidil (LONITEN) 2.5 MG tablet Take 5 mg by mouth daily.     . Misc Natural Products (PROSTATE HEALTH PO) Take 1 capsule by mouth daily. Super Beta Prostate    . Multiple Vitamin (MULTIVITAMIN WITH MINERALS) TABS tablet Take 1 tablet by mouth daily.    Vladimir Faster Glycol-Propyl Glycol (LUBRICANT EYE DROPS) 0.4-0.3 % SOLN Place 1 drop into both eyes 3 (three) times daily as needed (dry/irritated eyes.).    Marland Kitchen saw palmetto 500 MG capsule Take 500 mg by mouth every evening.    Marland Kitchen spironolactone (ALDACTONE) 25 MG tablet Take 0.5 tablets (12.5 mg total) by mouth daily. 60 tablet 1  . Turmeric 500 MG CAPS Take 500 mg by mouth daily.    . vitamin B-12 (CYANOCOBALAMIN) 500 MCG tablet Take 500 mcg by mouth daily.     No current facility-administered medications on file prior to visit.  No Known Allergies  Social History   Substance and Sexual Activity  Alcohol Use Yes  . Alcohol/week: 0.0 standard drinks   Comment: 6 pack of beer or less in a week    Social History   Tobacco Use  Smoking Status Former Smoker  . Packs/day: 0.00  . Years: 4.00  . Pack years: 0.00  . Quit date: 2000  . Years since quitting: 21.1  Smokeless Tobacco Never Used  Tobacco Comment   quit about 40 yrs ago    Review of Systems  Constitutional: Negative.   HENT: Negative.   Eyes: Negative.   Respiratory: Negative.   Cardiovascular: Negative.   Gastrointestinal: Negative.   Genitourinary: Positive for frequency.  Musculoskeletal: Negative.   Skin: Negative.   Neurological: Positive for tingling.  Endo/Heme/Allergies: Bruises/bleeds easily.  Psychiatric/Behavioral:  Negative.     Objective   Vitals:   09/14/19 1050  BP: (!) 151/67  Pulse: (!) 50  Resp: 12  Temp: 98.1 F (36.7 C)  SpO2: 97%    Physical Exam Vitals reviewed.  Constitutional:      Appearance: Normal appearance. He is normal weight. He is not ill-appearing.  HENT:     Head: Normocephalic and atraumatic.  Cardiovascular:     Rate and Rhythm: Normal rate and regular rhythm.     Heart sounds: Normal heart sounds. No murmur. No friction rub. No gallop.   Pulmonary:     Effort: Pulmonary effort is normal. No respiratory distress.     Breath sounds: Normal breath sounds. No stridor. No wheezing, rhonchi or rales.  Abdominal:     General: Abdomen is flat. Bowel sounds are normal. There is no distension.     Palpations: Abdomen is soft. There is no mass.     Tenderness: There is no abdominal tenderness. There is no guarding or rebound.     Hernia: No hernia is present.     Comments: No inguinal hernias identified.  Examination was performed both in the supine and standing positions.  Genitourinary:    Comments: Patient has a tubular hard mobile mass along the midportion of the right spermatic cord.  Right testicle appears to be in normal limits. Skin:    General: Skin is warm and dry.  Neurological:     Mental Status: He is alert and oriented to person, place, and time.     Assessment  Right spermatic cord mass, unknown etiology Plan   Will get ultrasound of this area to further assess the mass.  We will also get consultation from urology as I do not specifically feel an inguinal hernia.  Further management is pending those results.  Patient will follow-up here after the ultrasound.

## 2019-09-18 ENCOUNTER — Other Ambulatory Visit: Payer: Self-pay

## 2019-09-18 ENCOUNTER — Ambulatory Visit (HOSPITAL_COMMUNITY)
Admission: RE | Admit: 2019-09-18 | Discharge: 2019-09-18 | Disposition: A | Discharge status: Home / Self Care | Payer: Medicare PPO | Source: Ambulatory Visit | Attending: General Surgery | Admitting: General Surgery

## 2019-09-18 DIAGNOSIS — N5089 Other specified disorders of the male genital organs: Secondary | ICD-10-CM | POA: Diagnosis not present

## 2019-09-21 ENCOUNTER — Other Ambulatory Visit: Payer: Self-pay

## 2019-09-21 ENCOUNTER — Ambulatory Visit (INDEPENDENT_AMBULATORY_CARE_PROVIDER_SITE_OTHER): Payer: Medicare PPO | Admitting: General Surgery

## 2019-09-21 ENCOUNTER — Encounter: Payer: Self-pay | Admitting: General Surgery

## 2019-09-21 VITALS — BP 180/76 | HR 63 | Temp 98.7°F | Resp 14 | Ht 70.0 in | Wt 216.0 lb

## 2019-09-21 DIAGNOSIS — N433 Hydrocele, unspecified: Secondary | ICD-10-CM

## 2019-09-21 NOTE — Progress Notes (Signed)
Subjective:     Joseph [name]  Patient presents for review of his ultrasound of his scrotum. Objective:    BP (!) 180/76   Pulse 63   Temp 98.7 F (37.1 C) (Oral)   Resp 14   Ht 5\' 10"  (1.778 m)   Wt 216 lb (98 kg)   SpO2 98%   BMI 30.99 kg/m   General:  alert, cooperative and no distress  U/S shows a septated fluid collection along the midportion of the spermatic cord, with a right hydrocele     Assessment:    Right hydrocele    Plan:   Will see Dr. Alyson Ingles of Urology on 10/16/19 for further evaluation and treatment.  Follow up here prn.

## 2019-09-21 NOTE — Patient Instructions (Signed)
Hydrocele, Adult  A hydrocele is a collection of fluid in the loose pouch of skin that holds the testicles (scrotum). This may happen because:  The amount of fluid produced in the scrotum is not absorbed by the rest of the body.  Fluid from the abdomen fills the scrotum. Normally, the testicles develop in the abdomen then move (drop) into to the scrotum before birth. The tube that the testicles travel through usually closes after the testicles drop. If the tube does not close, fluid from the abdomen can fill the scrotum. This is less common in adults.  What are the causes?  The cause of a hydrocele in adults is usually not known. However, it may be caused by:  An injury to the scrotum.  An infection (epididymitis).  Decreased blood flow to the scrotum.  Twisting of a testicle (testicular torsion).  A birth defect.  A tumor or cancer of the testicle.  What are the signs or symptoms?  A hydrocele feels like a water-filled balloon. It may also feel heavy. Other symptoms include:  Swelling of the scrotum. The swelling may decrease when you lie down. You may also notice more swelling at night than in the morning.  Swelling of the groin.  Mild discomfort in the scrotum.  Pain. This can develop if the hydrocele was caused by infection or twisting. The larger the hydrocele, the more likely you are to have pain.  How is this diagnosed?  This condition may be diagnosed based on:  Physical exam.  Medical history.  You may also have other tests, including:  Imaging tests, such as ultrasound.  Blood or urine tests.  How is this treated?  Most hydroceles go away on their own. If you have no discomfort or pain, your health care provider may suggest close monitoring of your condition (called watch and wait or watchful waiting) until the condition goes away or symptoms develop. If treatment is needed, it may include:  Treating an underlying condition. This may include using an antibiotic medicine to treat an infection.  Surgery to  stop fluid from collecting in the scrotum.  Surgery to drain the fluid. Options include:  Needle aspiration. A needle is used to drain fluid. However, the fluid buildup will come back quickly.  Hydrocelectomy. For this procedure, an incision is made in the scrotum to remove the fluid sac.  Follow these instructions at home:  Watch the hydrocele for any changes.  Take over-the-counter and prescription medicines only as told by your health care provider.  If you were prescribed an antibiotic medicine, use it as told by your health care provider. Do not stop taking the antibiotic even if you start to feel better.  Keep all follow-up visits as told by your health care provider. This is important.  Contact a health care provider if:  You notice any changes in the hydrocele.  The swelling in your scrotum or groin gets worse.  The hydrocele becomes red, firm, painful, or tender to the touch.  You have a fever.  Get help right away if you:  Develop a lot of pain, or your pain becomes worse.  Summary  A hydrocele is a collection of fluid in the loose pouch of skin that holds the testicles (scrotum).  Hydroceles can cause swelling, discomfort, and sometimes pain.  In adults, the cause of a hydrocele usually is not known. However, it is sometimes caused by an infection or a rotation and twisting of the scrotum.  Treatment is usually not   may be given to ease the pain. This information is not intended to replace advice given to you by your health care provider. Make sure you discuss any questions you have with your health care provider. Document Revised: 07/10/2017 Document Reviewed: 07/10/2017 Elsevier Patient Education  2020 [employer]  

## 2019-10-03 ENCOUNTER — Other Ambulatory Visit: Payer: Self-pay

## 2019-10-03 ENCOUNTER — Encounter: Payer: Self-pay | Admitting: Cardiovascular Disease

## 2019-10-03 ENCOUNTER — Ambulatory Visit (INDEPENDENT_AMBULATORY_CARE_PROVIDER_SITE_OTHER): Payer: Medicare PPO | Admitting: Cardiovascular Disease

## 2019-10-03 DIAGNOSIS — Z7901 Long term (current) use of anticoagulants: Secondary | ICD-10-CM

## 2019-10-03 DIAGNOSIS — I519 Heart disease, unspecified: Secondary | ICD-10-CM

## 2019-10-03 DIAGNOSIS — I480 Paroxysmal atrial fibrillation: Secondary | ICD-10-CM

## 2019-10-03 DIAGNOSIS — I10 Essential (primary) hypertension: Secondary | ICD-10-CM | POA: Diagnosis not present

## 2019-10-03 DIAGNOSIS — I452 Bifascicular block: Secondary | ICD-10-CM | POA: Diagnosis not present

## 2019-10-03 DIAGNOSIS — I5189 Other ill-defined heart diseases: Secondary | ICD-10-CM

## 2019-10-03 NOTE — Patient Instructions (Signed)

## 2019-10-03 NOTE — Progress Notes (Signed)
Patient ID: [name], male   DOB: Dec 19, 1945, 74 y.o.   MRN: [id number]   Primary M.D.: Dr. Newt Minion  HPI: Joseph [name] is a 74 y.o. male who presents to the office for a 2 month cardiology evaluation.  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 He has a long-standing history of hypertension. A CT scan in 2013 suggested small lacunar infarctions most likely due to hypertension. I initially saw in April 2013 after this CT imaging study was done. He has documented normal systolic function with mild concentric LVH with diastolic dysfunction. An echo Doppler study in April 2013 revealed his mitral valve E/A ratio is 3.8 suggestive of restrictive physiology. He has documented renal cysts involving the left kidney showing a large inferior pole cortical cyst is measured 4.5 x 4.7 x 4.4 cm. In addition there was a second area at the mid pole laterally which appeared multi-septated and measured 2.7 x 2.7 x 1.9. I  recommended urologic evaluation which he did at [facility] and he was told that these were benign.  Joseph [name] has a history of moderate obesity.  In the past.  He had weight as high as 250 pounds.  Over the past several years.  His weight has been in the 220s.  When I last saw him, he was walking 3 miles at a time 3 days per week and denied any exertional chest pain or palpitations.   Over the past several years, he has been seen by Joseph Deforest PA and also saw Joseph [name], [facility] for blood pressure management.  He last saw Joseph [name] on 03/18/2017, who switched losartan to irbesartan.  His blood pressure medications now include amlodipine 10 mg, irbesartan 300 mg, doxazosin 8 mg,minoxidil 5 mg, and Toprol, which she has been taking 25 mg as needed, but has not been taking this regularly.  He denies any episodes of chest pressure.  His last echo Doppler study was in December 2016 which showed mild LVH, EF 50-55% with grade 2 diastolic dysfunction.    When I  saw him in October 2018 he complained of fatigue and had noted a slow pulse.  He was bradycardic with heart rates in the 40s and I discontinued Toprol.  He was hypertensive and I recommended initiation of hydralazine 25 mg twice a day.  I scheduled him for renal Doppler studies to make certain there was no renal vascular etiology to his hypertension on multiple medical drug regimen.  Renal Doppler study did not demonstrate any evidence for renal artery stenosis.  He again was noted to have previously documented renal cysts with the right upper pole cyst at 1.5 x 1.7 cm, and a left exophytic lower pole cyst at 4.5 x 5.4 cm and left upper pole striated cyst at 3.6 x 2.5 cm.  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  He denies any abdominal complaints.  He had stopped taking hydralazine when he called the office in December 2018 due to feeling that he was having lower extremity edema.  He also felt that the hydralazine was contributing to his fatigue.  His edema has improved with diabetic socks.    I saw him in January 2019 at which time he was bradycardic with a heart rate at 47 bpm.  He was unaware of any recurrent atrial fibrillation episodes and continue to be on Eliquis 5 mg twice a day.  His blood pressure was controlled on amlodipine 10 mg, Cardura 4 mg at bedtime, Joseph [name] 300 mg, in addition to minoxidil 5 mg daily.  I saw him in January 2020 and at that time his blood pressure was elevated at 158/80.  With his previous history of grade 2 diastolic dysfunction I elected to add Spironolactone 12.5 mg to his regimen. Remotely I had placed him on hydralazine which he did not tolerate.  He was  evaluated by me in a telemedicine visit in April 2020.  He denied any chest pain or awareness of recurrent arrhythmia.  He states his blood pressure at times is up and down.  He apparently has only been taking the Spironolactone every other  day.  At times his blood pressure can reach 098 systolically.  Pulse 50.  He recently began to exercise more than he had previously and has been walking 3 miles at least 3 days/week.  He underwent a follow-up echo Doppler study in February 2020 which showed mild concentric LVH with EF 55 to 60%.  Diastolic function could not be evaluated due to poor image quality  I last saw him on August 10, 2019 at that time he informed me that his blood pressure was typically  greater than 119 systolically but at times there have been some instances where it was in the 120s.  At times his heart rate was low but he denied any significant dizziness.  His ECG however showed bradycardia at 47 bpm with a junctional rhythm and intermittent P waves.  I recommended follow-up laboratory and suggested he wear a 2-week event monitor or Zio patch.  He ultimately received a Zio patch and just returned this last week.  This has not yet been processed for my review.  Presently he denies chest pain PND or orthopnea or dizziness.  He presents for evaluation.  Past Medical History:  Diagnosis Date  . 1st degree AV block   . Diastolic dysfunction 1/47   grade 1  . Hypertension   . New onset atrial fibrillation ([place]) 06/2015  . RBBB   . Sinus bradycardia   . Stroke [facility])    no defecits, pt denies having a stroke, says Dr Claiborne [name] diagnosed this    Past Surgical History:  Procedure Laterality Date  . COLONOSCOPY    . COLONOSCOPY  12/30/2011   Procedure: COLONOSCOPY;  Surgeon: [name], MD;  [facility];  Service: Endoscopy;  Laterality: N/A;  1:45PM  . COLONOSCOPY N/A 04/19/2019   Procedure: COLONOSCOPY;  Surgeon: [name], MD;  [facility];  Service: Endoscopy;  Laterality: N/A;  2:00pm  . EXCISION MASS LOWER EXTREMETIES Left 05/12/2018   Procedure: Left foot plantar fibroma excision;  Surgeon: [name], MD;  Location: [place];  Service: Orthopedics;  Laterality: Left;     No Known Allergies  Current Outpatient Medications  Medication Sig Dispense Refill  . amLODipine (NORVASC) 10 MG tablet Take 1 tablet (10 mg total) by mouth daily. (Patient taking differently: Take 10 mg by mouth every evening. ) 90 tablet 1  . apixaban (ELIQUIS) 5 MG TABS tablet Take 1 tablet (5 mg total) by mouth 2 (two) times daily. 84 tablet 0  . cholecalciferol (VITAMIN D) 25 MCG (1000 UT) tablet Take 1,000 Units by mouth daily.    . Coenzyme Q10 (COQ10) 200 MG CAPS Take 200 mg by mouth daily.    Marland Kitchen doxazosin (CARDURA) 8 MG tablet Take 8 mg by mouth  at bedtime.     . irbesartan (AVAPRO) 300 MG tablet TAKE 1 TABLET BY MOUTH DAILY 90 tablet 2  . minoxidil (LONITEN) 2.5 MG tablet Take 5 mg by mouth daily.     . Misc Natural Products (PROSTATE HEALTH PO) Take 1 capsule by mouth daily. Super Beta Prostate    . Multiple Vitamin (MULTIVITAMIN WITH MINERALS) TABS tablet Take 1 tablet by mouth daily.    Vladimir Faster Glycol-Propyl Glycol (LUBRICANT EYE DROPS) 0.4-0.3 % SOLN Place 1 drop into both eyes 3 (three) times daily as needed (dry/irritated eyes.).    Marland Kitchen saw palmetto 500 MG capsule Take 500 mg by mouth every evening.    Marland Kitchen spironolactone (ALDACTONE) 25 MG tablet Take 0.5 tablets (12.5 mg total) by mouth daily. 60 tablet 1  . Turmeric 500 MG CAPS Take 500 mg by mouth daily.    . vitamin B-12 (CYANOCOBALAMIN) 500 MCG tablet Take 500 mcg by mouth daily.     No current facility-administered medications for this visit.    Social History   Socioeconomic History  . Marital status: Married    Spouse name: Not on file  . Number of children: Not on file  . Years of education: Not on file  . Highest education level: Not on file  Occupational History  . Not on file  Tobacco Use  . Smoking status: Former Smoker    Packs/day: 0.00    Years: 4.00    Pack years: 0.00    Quit date: 2000    Years since quitting: 21.2  . Smokeless tobacco: Never Used  . Tobacco comment: quit about 40 yrs ago   Substance and Sexual Activity  . Alcohol use: Yes    Alcohol/week: 0.0 standard drinks    Comment: 6 pack of beer or less in a week  . Drug use: No  . Sexual activity: Not on file  Other Topics Concern  . Not on file  Social History Narrative  . Not on file   Social Determinants of Health   Financial Resource Strain:   . Difficulty of Paying Living Expenses:   Food Insecurity:   . Worried About Charity fundraiser in the Last Year:   . Arboriculturist in the Last Year:   Transportation Needs:   . Film/video editor (Medical):   Marland Kitchen Lack of Transportation (Non-Medical):   Physical Activity:   . Days of Exercise per Week:   . Minutes of Exercise per Session:   Stress:   . Feeling of Stress :   Social Connections:   . Frequency of Communication with Friends and Family:   . Frequency of Social Gatherings with Friends and Family:   . Attends Religious Services:   . Active Member of Clubs or Organizations:   . Attends Archivist Meetings:   Marland Kitchen Marital Status:   Intimate Partner Violence:   . Fear of Current or Ex-Partner:   . Emotionally Abused:   Marland Kitchen Physically Abused:   . Sexually Abused:     Family History  Problem Relation Age of Onset  . Cancer Brother   . CVA Mother   . Hypertension Father   . Colon cancer Neg Hx     ROS General: Negative; No fevers, chills, or night sweats;  HEENT: Negative; No changes in vision or hearing, sinus congestion, difficulty swallowing Pulmonary: Negative; No cough, wheezing, shortness of breath, hemoptysis Cardiovascular: Negative; No chest pain, presyncope, syncope, palpitations GI: Negative; No nausea, vomiting, diarrhea,  or abdominal pain GU: History of renal cysts Musculoskeletal: Negative; no myalgias, joint pain, or weakness Hematologic/Oncology: Negative; no easy bruising, bleeding Endocrine: Negative; no heat/cold intolerance; no diabetes Neuro: Negative; no changes in balance, headaches Skin: Negative; No  rashes or skin lesions Psychiatric: Negative; No behavioral problems, depression Sleep: Negative; No snoring, daytime sleepiness, hypersomnolence, bruxism, restless legs, hypnogognic hallucinations, no cataplexy Other comprehensive 14 point system review is negative.   PE BP 134/80   Pulse 65   Temp (!) 97.2 F (36.2 C)   Ht '5\' 10"'  (1.778 m)   Wt 213 lb (96.6 kg)   SpO2 95%   BMI 30.56 kg/m    Blood pressure today 136/78  Wt Readings from Last 3 Encounters:  10/03/19 213 lb (96.6 kg)  09/21/19 216 lb (98 kg)  09/14/19 213 lb (96.6 kg)   General: Alert, oriented, no distress.  Skin: normal turgor, no rashes, warm and dry HEENT: Normocephalic, atraumatic. Pupils equal round and reactive to light; sclera anicteric; extraocular muscles intact;  Nose without nasal septal hypertrophy Mouth/Parynx benign; Mallinpatti scale 3 Neck: No JVD, no carotid bruits; normal carotid upstroke Lungs: clear to ausculatation and percussion; no wheezing or rales Chest wall: without tenderness to palpitation Heart: PMI not displaced, RRR, s1 s2 normal, 1/6 systolic murmur, no diastolic murmur, no rubs, gallops, thrills, or heaves Abdomen: soft, nontender; no hepatosplenomehaly, BS+; abdominal aorta nontender and not dilated by palpation. Back: no CVA tenderness Pulses 2+ Musculoskeletal: full range of motion, normal strength, no joint deformities Extremities: no clubbing cyanosis or edema, Homan's sign negative  Neurologic: grossly nonfocal; Cranial nerves grossly wnl Psychologic: Normal mood and affect  ECG (independently read by me): Sinus rhythm at 65 bpm with occasional PVCs.  Right bundle branch block with repolarization, left anterior hemiblock, LVH by voltage.  August 10, 2019 ECG (independently read by me): Bradycardia at 47 bpm with junctional rhyhm, intermittent P waves  August 09, 2018 ECG (independently read by me): Sinus bradycardia at 65 bpm with sinus arrhythmia, PACs,  first-degree AV block with a PR interval '2 6 6 ' ms, right bundle branch block with repolarization changes, left anterior hemiblock.  Septal Q wave V1.  July 21, 2017 ECG (independently read by me): Sinus bradycardia at 47 bpm.  First-degree AV block with a PR interval of 222 ms.  Right bundle branch block with repolarization changes.  PAC.  October 2018 ECG (independently read by me): Sinus bradycardia at 47 bpm. Accelerated AV conduction with a PR interval at 88 ms.  Right bundle-branch block with repolarization changes.  However, question blocked PACs every other beat.  April 2016 ECG (independently read by me): Sinus bradycardia at 54 bpm with right bundle branch block.  PAC.  QTc interval 479 msec.  PR interval 196 ms.  September 2014 ECG: Sinus bradycardia with first-degree AV block. Heart rate 46 beats per minute. Right bundle branch block, unchanged.  LABS: BMP Latest Ref Rng & Units 08/15/2019 08/12/2018 04/20/2017  Glucose 65 - 99 mg/dL 82 95 96  BUN 8 - 27 mg/dL '15 13 14  ' Creatinine 0.76 - 1.27 mg/dL 1.01 1.06 1.04  BUN/Creat Ratio 10 - '24 15 12 13  ' Sodium 134 - 144 mmol/L 138 140 142  Potassium 3.5 - 5.2 mmol/L 4.6 4.7 4.6  Chloride 96 - 106 mmol/L 100 102 106  CO2 20 - 29 mmol/L '25 23 24  ' Calcium 8.6 - 10.2 mg/dL 9.5 9.4 9.1   Hepatic Function Latest Ref Rng & Units 08/15/2019  08/12/2018 04/20/2017  Total Protein 6.0 - 8.5 g/dL 7.0 6.8 7.2  Albumin 3.7 - 4.7 g/dL 4.4 4.2 4.1  AST 0 - 40 IU/L '22 25 26  ' ALT 0 - 44 IU/L '14 19 19  ' Alk Phosphatase 39 - 117 IU/L 114 99 109  Total Bilirubin 0.0 - 1.2 mg/dL 1.1 1.2 1.0   CBC Latest Ref Rng & Units 08/15/2019 08/12/2018 04/20/2017  WBC 3.4 - 10.8 x10E3/uL 4.2 3.6 4.3  Hemoglobin 13.0 - 17.7 g/dL 13.2 12.4(L) 12.7(L)  Hematocrit 37.5 - 51.0 % 41.7 40.3 38.1  Platelets 150 - 450 x10E3/uL 242 236 242   Lab Results  Component Value Date   MCV 70 (L) 08/15/2019   MCV 70 (L) 08/12/2018   MCV 66 (L) 04/20/2017   Lab Results  Component  Value Date   TSH 1.280 08/15/2019   No results found for: HGBA1C   Lipid Panel     Component Value Date/Time   CHOL 165 08/15/2019 1410   TRIG 56 08/15/2019 1410   HDL 73 08/15/2019 1410   CHOLHDL 2.3 08/15/2019 1410   LDLCALC 81 08/15/2019 1410    RADIOLOGY: No results found.  IMPRESSION:  1. Essential hypertension   2. Paroxysmal atrial fibrillation (HCC)   3. 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 Anticoagulation adequate   5. Grade II diastolic dysfunction    ASSESSMENT AND PLAN: Mr. Pickrell is 74 year old African American male who has a history of hypertension and CT evidence for small lacunar infarctions most likely of hypertensive etiology. There also is a remote history of rheumatic fever age 83. He had developed transient symptoms of right arm weakness with numbness in August 2012 which led to his MRI of his head which showed scattered small white matter hyper intensities bilaterally with also involvement in the right putamen, bilateral thalamus the also he has some additional changes in the pons, right and left inferior cerebellum.  A prior echo Doppler study has shown restrictive physiology.  His  echo Doppler study in December 2016 showed an EF of 50-55%, LVH, and grade 2 diastolic dysfunction.  There was mild biatrial enlargement.    He has had issues in the past with bradycardia leading to discontinuance of his metoprolol.  When I saw him in August 10, 2019, his ECG showed a junctional rhythm there were periods where there were P waves with short PR interval and other times without P waves.  He was not on any negative chronotropic medications.  He underwent laboratory which essentially was normal.  Creatinine was 1.01 with potassium 4.6.  Abnormal LFTs.  TSH was normal at 1.28.  Lipid studies were stable with total cholesterol 165 triglycerides 56 HDL 73 LDL 81.  Magnesium was 1.9.  He was not anemic but had microcytic indices with an  MCV of 70 which is consistent with his known thalassemia.  His ECG today shows resolution of his previous junctional rhythm and now shows sinus bradycardia at 65 bpm with PVCs which are asymptomatic.  I will contact him once the Zio patch results are available.  Presently he will continue with amlodipine 10 mg, irbesartan 300 mg, doxazosin 8 mg at bedtime, spironolactone 12.5 mg daily in addition to his minoxidil for blood pressure control.  He is on apixaban for anticoagulation and is tolerating this well without bleeding.  As he is stable I will see him in 6 months for reevaluation.   Time spent: 25 minutes Marcello Moores  A. Claiborne Billings, MD, [facility]  10/05/2019 6:24 PM

## 2019-10-05 ENCOUNTER — Encounter: Payer: Self-pay | Admitting: Cardiovascular Disease

## 2019-10-16 ENCOUNTER — Other Ambulatory Visit: Payer: Self-pay

## 2019-10-16 ENCOUNTER — Encounter: Payer: Self-pay | Admitting: Urology

## 2019-10-16 ENCOUNTER — Ambulatory Visit (INDEPENDENT_AMBULATORY_CARE_PROVIDER_SITE_OTHER): Payer: Medicare PPO | Admitting: Urology

## 2019-10-16 VITALS — BP 158/68 | HR 48 | Temp 98.2°F | Ht 70.0 in | Wt 212.0 lb

## 2019-10-16 DIAGNOSIS — N5089 Other specified disorders of the male genital organs: Secondary | ICD-10-CM | POA: Diagnosis not present

## 2019-10-16 NOTE — Progress Notes (Signed)
10/16/2019 3:22 PM   Trinna Balloon Wojtas 1946-06-04 [id number]  Referring provider: Lemmie Evens, MD [place],  [place] [id number]  Right testicular swelling  HPI: Mr Heckman is a 74yo here for evaluation for right testicular swelling that started 6-8 months ago. No hx of trauma. NO LUTS. No testicular pain. No induration. The patient states it has been growing in size for 3 months.   PMH: Past Medical History:  Diagnosis Date  . 1st degree AV block   . Atrial fibrillation ([place])   . Diastolic dysfunction 123456   grade 1  . Hypertension   . New onset atrial fibrillation ([place]) 06/2015  . RBBB   . Sinus bradycardia   . Stroke [facility])    no defecits, pt denies having a stroke, says [name] diagnosed this    Surgical History: Past Surgical History:  Procedure Laterality Date  . COLONOSCOPY    . COLONOSCOPY  12/30/2011   Procedure: COLONOSCOPY;  Surgeon: [name], MD;  [facility];  Service: Endoscopy;  Laterality: N/A;  1:45PM  . COLONOSCOPY N/A 04/19/2019   Procedure: COLONOSCOPY;  Surgeon: [name], MD;  [facility];  Service: Endoscopy;  Laterality: N/A;  2:00pm  . EXCISION MASS LOWER EXTREMETIES Left 05/12/2018   Procedure: Left foot plantar fibroma excision;  Surgeon: [name], MD;  Location: [facility];  Service: Orthopedics;  Laterality: Left;  . FOOT SURGERY Left     Home Medications:  Allergies as of 10/16/2019   No Known Allergies     Medication List       Accurate as of October 16, 2019  3:22 PM. If you have any questions, ask your nurse or doctor.        amLODipine 10 MG tablet Commonly known as: NORVASC Take 1 tablet (10 mg total) by mouth daily. What changed: when to take this   apixaban 5 MG Tabs tablet Commonly known as: Eliquis Take 1 tablet (5 mg total) by mouth 2 (two) times daily.   cholecalciferol 25 MCG (1000 UNIT) tablet Commonly known as: VITAMIN D Take 1,000 Units by  mouth daily.   CoQ10 200 MG Caps Take 200 mg by mouth daily.   doxazosin 8 MG tablet Commonly known as: CARDURA Take 8 mg by mouth at bedtime.   irbesartan 300 MG tablet Commonly known as: AVAPRO TAKE 1 TABLET BY MOUTH DAILY   Lubricant Eye Drops 0.4-0.3 % Soln Generic drug: Polyethyl Glycol-Propyl Glycol Place 1 drop into both eyes 3 (three) times daily as needed (dry/irritated eyes.).   minoxidil 2.5 MG tablet Commonly known as: LONITEN Take 5 mg by mouth daily.   multivitamin with minerals Tabs tablet Take 1 tablet by mouth daily.   PROSTATE HEALTH PO Take 1 capsule by mouth daily. Super Beta Prostate   saw palmetto 500 MG capsule Take 500 mg by mouth every evening.   spironolactone 25 MG tablet Commonly known as: ALDACTONE Take 0.5 tablets (12.5 mg total) by mouth daily.   Turmeric 500 MG Caps Take 500 mg by mouth daily.   vitamin B-12 500 MCG tablet Commonly known as: CYANOCOBALAMIN Take 500 mcg by mouth daily.       Allergies: No Known Allergies  Family History: Family History  Problem Relation Age of Onset  . Cancer Brother   . CVA Mother   . Hypertension Father   . Colon cancer Neg Hx     Social History:  reports  that he quit smoking about 21 years ago. He smoked 0.00 packs per day for 4.00 years. He has never used smokeless tobacco. He reports current alcohol use. He reports that he does not use drugs.  ROS: All other review of systems were reviewed and are negative except what is noted above in HPI  Physical Exam: BP (!) 158/68   Pulse (!) 48   Temp 98.2 F (36.8 C)   Ht 5\' 10"  (1.778 m)   Wt 212 lb (96.2 kg)   BMI 30.42 kg/m   Constitutional:  Alert and oriented, No acute distress. HEENT: [place] AT, moist mucus membranes.  Trachea midline, no masses. Cardiovascular: No clubbing, cyanosis, or edema. Respiratory: Normal respiratory effort, no increased work of breathing. GI: Abdomen is soft, nontender, nondistended, no abdominal  masses GU: No CVA tenderness. Circumcised phallus. No masses/lesions on penis, scrotum, testis. 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  Lymph: No cervical or inguinal lymphadenopathy. Skin: No rashes, bruises or suspicious lesions. Neurologic: Grossly intact, no focal deficits, moving all 4 extremities. Psychiatric: Normal mood and affect.  Laboratory Data: Lab Results  Component Value Date   WBC 4.2 08/15/2019   HGB 13.2 08/15/2019   HCT 41.7 08/15/2019   MCV 70 (L) 08/15/2019   PLT 242 08/15/2019    Lab Results  Component Value Date   CREATININE 1.01 08/15/2019    No results found for: PSA  No results found for: TESTOSTERONE  No results found for: HGBA1C  Urinalysis No results found for: COLORURINE, APPEARANCEUR, LABSPEC, PHURINE, GLUCOSEU, HGBUR, BILIRUBINUR, KETONESUR, PROTEINUR, UROBILINOGEN, NITRITE, LEUKOCYTESUR  No results found for: LABMICR, [place], RBCUA, LABEPIT, MUCUS, BACTERIA  Pertinent Imaging: Scrotal US images reviewed and discussed with the patient No results found for this or any previous visit. No results found for this or any previous visit. No results found for this or any previous visit. No results found for this or any previous visit. No results found for this or any previous visit. No results found for this or any previous visit. No results found for this or any previous visit. No results found for this or any previous visit.  Assessment & Plan:    1. 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    No follow-ups on file.  Nicolette Bang, MD  [facility] [place]

## 2019-10-16 NOTE — Progress Notes (Signed)
Urological Symptom Review  Patient is experiencing the following symptoms: Frequent urination Hard to postpone urination Get up at night to urinate   Review of Systems  Gastrointestinal (upper)  : Negative for upper GI symptoms  Gastrointestinal (lower) : Negative for lower GI symptoms  Constitutional : Negative for symptoms  Skin: Negative for skin symptoms  Eyes: Negative for eye symptoms  Ear/Nose/Throat : Negative for Ear/Nose/Throat symptoms  Hematologic/Lymphatic: Negative for Hematologic/Lymphatic symptoms  Cardiovascular : Negative for cardiovascular symptoms  Respiratory : Negative for respiratory symptoms  Endocrine: Negative for endocrine symptoms  Musculoskeletal: Negative for musculoskeletal symptoms  Neurological: Negative for neurological symptoms  Psychologic: Negative for psychiatric symptoms  

## 2019-10-16 NOTE — H&P (View-Only) (Signed)
10/16/2019 3:22 PM   Joseph Joseph [birth year] [id number]  Referring provider: Lemmie Evens, MD [place],  [place] [address]  Right testicular swelling  HPI: [name] is a 74yo here for evaluation for right testicular swelling that started 6-8 months ago. No hx of trauma. NO LUTS. No testicular pain. No induration. The patient states it has been growing in size for 3 months.   PMH: Past Medical History:  Diagnosis Date  . 1st degree AV block   . Atrial fibrillation ([place])   . Diastolic dysfunction 123456   grade 1  . Hypertension   . New onset atrial fibrillation ([place]) 06/2015  . RBBB   . Sinus bradycardia   . Stroke [facility])    no defecits, pt denies having a stroke, says Dr Joseph Joseph diagnosed this    Surgical History: Past Surgical History:  Procedure Laterality Date  . COLONOSCOPY    . COLONOSCOPY  12/30/2011   Procedure: COLONOSCOPY;  Surgeon: Joseph Dolin, MD;  [facility];  Service: Endoscopy;  Laterality: N/A;  1:45PM  . COLONOSCOPY N/A 04/19/2019   Procedure: COLONOSCOPY;  Surgeon: Joseph Dolin, MD;  [facility];  Service: Endoscopy;  Laterality: N/A;  2:00pm  . EXCISION MASS LOWER EXTREMETIES Left 05/12/2018   Procedure: Left foot plantar fibroma excision;  Surgeon: Joseph Simmer, MD;  Location: [facility];  Service: Orthopedics;  Laterality: Left;  . FOOT SURGERY Left     Home Medications:  Allergies as of 10/16/2019   No Known Allergies     Medication List       Accurate as of October 16, 2019  3:22 PM. If you have any questions, ask your nurse or doctor.        amLODipine 10 MG tablet Commonly known as: NORVASC Take 1 tablet (10 mg total) by mouth daily. What changed: when to take this   apixaban 5 MG Tabs tablet Commonly known as: Eliquis Take 1 tablet (5 mg total) by mouth 2 (two) times daily.   cholecalciferol 25 MCG (1000 UNIT) tablet Commonly known as: VITAMIN D Take 1,000 Units by  mouth daily.   CoQ10 200 MG Caps Take 200 mg by mouth daily.   doxazosin 8 MG tablet Commonly known as: CARDURA Take 8 mg by mouth Joseph Joseph.   irbesartan 300 MG tablet Commonly known as: AVAPRO TAKE 1 TABLET BY MOUTH DAILY   Lubricant Eye Drops 0.4-0.3 % Soln Generic drug: Polyethyl Glycol-Propyl Glycol Place 1 drop into both eyes 3 (three) times daily as needed (dry/irritated eyes.).   minoxidil 2.5 MG tablet Commonly known as: LONITEN Take 5 mg by mouth daily.   multivitamin with minerals Tabs tablet Take 1 tablet by mouth daily.   PROSTATE HEALTH PO Take 1 capsule by mouth daily. Super Beta Prostate   saw palmetto 500 MG capsule Take 500 mg by mouth every evening.   spironolactone 25 MG tablet Commonly known as: ALDACTONE Take 0.5 tablets (12.5 mg total) by mouth daily.   Turmeric 500 MG Caps Take 500 mg by mouth daily.   vitamin B-12 500 MCG tablet Commonly known as: CYANOCOBALAMIN Take 500 mcg by mouth daily.       Allergies: No Known Allergies  Family History: Family History  Problem Relation Age of Onset  . Cancer Brother   . CVA Mother   . Hypertension Father   . Colon cancer Neg Hx     Social History:  reports  that he quit smoking about 21 years ago. He smoked 0.00 packs per day for 4.00 years. He has never used smokeless tobacco. He reports current alcohol use. He reports that he does not use drugs.  ROS: All other review of systems were reviewed and are negative except what is noted above in HPI  Physical Exam: BP (!) 158/68   Pulse (!) 48   Temp 98.2 F (36.8 C)   Ht 5\' 10"  (1.778 m)   Wt 212 lb (96.2 kg)   BMI 30.42 kg/m   Constitutional:  Alert and oriented, No acute distress. HEENT: Joseph Joseph, moist mucus membranes.  Trachea midline, no masses. Cardiovascular: No clubbing, cyanosis, or edema. Respiratory: Normal respiratory effort, no increased work of breathing. GI: Abdomen is soft, nontender, nondistended, no abdominal  masses GU: No CVA tenderness. Circumcised phallus. No masses/lesions on penis, scrotum, testis. 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  Lymph: No cervical or inguinal lymphadenopathy. Skin: No rashes, bruises or suspicious lesions. Neurologic: Grossly intact, no focal deficits, moving all 4 extremities. Psychiatric: Normal mood and affect.  Laboratory Data: Lab Results  Component Value Date   WBC 4.2 08/15/2019   HGB 13.2 08/15/2019   HCT 41.7 08/15/2019   MCV 70 (L) 08/15/2019   PLT 242 08/15/2019    Lab Results  Component Value Date   CREATININE 1.01 08/15/2019    No results found for: PSA  No results found for: TESTOSTERONE  No results found for: HGBA1C  Urinalysis No results found for: COLORURINE, APPEARANCEUR, LABSPEC, PHURINE, GLUCOSEU, HGBUR, BILIRUBINUR, KETONESUR, PROTEINUR, UROBILINOGEN, NITRITE, LEUKOCYTESUR  No results found for: LABMICR, [place], RBCUA, LABEPIT, MUCUS, BACTERIA  Pertinent Imaging: Scrotal US images reviewed and discussed with the patient No results found for this or any previous visit. No results found for this or any previous visit. No results found for this or any previous visit. No results found for this or any previous visit. No results found for this or any previous visit. No results found for this or any previous visit. No results found for this or any previous visit. No results found for this or any previous visit.  Assessment & Plan:    1. 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    No follow-ups on file.  Joseph Bang, MD  [facility]

## 2019-10-17 ENCOUNTER — Telehealth: Payer: Self-pay | Admitting: *Deleted

## 2019-10-17 NOTE — Telephone Encounter (Signed)
Pt takes Eliquis for afib with CHADS2VASc score of 6 (age, diastolic dysfunction, HTN, CAD, CVA). Renal function is normal. Recommend holding Eliquis for 1 day prior to procedure due to elevated cardiac risk. Resume as soon as safely possible afterwards.

## 2019-10-17 NOTE — Telephone Encounter (Signed)
   [facility] Pre-operative Risk Assessment    Request for surgical clearance:  1. What type of surgery is being performed? Excision of spermatic cord cyst  2. When is this surgery scheduled? 11/13/2019   3. What type of clearance is required (medical clearance vs. Pharmacy clearance to hold med vs. Both)? BOTH  4. Are there any medications that need to be held prior to surgery and how long? Eliquis   5. Practice name and name of physician performing surgery? [facility]  6. What is your office phone number [phone]    7.   What is your office fax number [phone]  8.   Anesthesia type (None, local, MAC, general) ? Georgiann Hahn 10/17/2019, 3:34 PM  _________________________________________________________________   (provider comments below)

## 2019-10-31 NOTE — Telephone Encounter (Signed)
Rather than holding for just 24 hours I would at least hold for 36 hours

## 2019-11-01 NOTE — Telephone Encounter (Signed)
   Primary Cardiologist: Shelva Majestic, MD  Chart reviewed as part of pre-operative protocol coverage. Given past medical history and time since last visit, based on ACC/AHA guidelines, Joseph [name] would be at acceptable risk for the planned procedure without further cardiovascular testing.   He will hold Eliquis for 36 hours prior to the procedure. I have spoken to the patient by phone and explained this to him as well. He verbalized understanding. She notes below.  I will route this recommendation to the requesting party via Epic fax function and remove from pre-op pool.  Please call with questions.  Phill Myron. [place] Pugh, ANP, AACC  11/01/2019, 9:27 AM

## 2019-11-06 ENCOUNTER — Other Ambulatory Visit: Payer: Self-pay

## 2019-11-06 ENCOUNTER — Emergency Department (HOSPITAL_COMMUNITY): Payer: Medicare PPO

## 2019-11-06 ENCOUNTER — Emergency Department (HOSPITAL_COMMUNITY)
Admission: EM | Admit: 2019-11-06 | Discharge: 2019-11-06 | Disposition: A | Discharge status: Home / Self Care | Payer: Medicare PPO | Attending: Emergency Medicine | Admitting: Emergency Medicine

## 2019-11-06 ENCOUNTER — Encounter (HOSPITAL_COMMUNITY): Payer: Self-pay | Admitting: *Deleted

## 2019-11-06 DIAGNOSIS — R42 Dizziness and giddiness: Secondary | ICD-10-CM | POA: Diagnosis not present

## 2019-11-06 DIAGNOSIS — Z20822 Contact with and (suspected) exposure to covid-19: Secondary | ICD-10-CM | POA: Insufficient documentation

## 2019-11-06 DIAGNOSIS — R0602 Shortness of breath: Secondary | ICD-10-CM

## 2019-11-06 DIAGNOSIS — M7989 Other specified soft tissue disorders: Secondary | ICD-10-CM

## 2019-11-06 DIAGNOSIS — R2243 Localized swelling, mass and lump, lower limb, bilateral: Secondary | ICD-10-CM | POA: Diagnosis not present

## 2019-11-06 DIAGNOSIS — Z87891 Personal history of nicotine dependence: Secondary | ICD-10-CM | POA: Insufficient documentation

## 2019-11-06 DIAGNOSIS — I5030 Unspecified diastolic (congestive) heart failure: Secondary | ICD-10-CM | POA: Diagnosis not present

## 2019-11-06 DIAGNOSIS — R2681 Unsteadiness on feet: Secondary | ICD-10-CM | POA: Diagnosis not present

## 2019-11-06 DIAGNOSIS — I110 Hypertensive heart disease with heart failure: Secondary | ICD-10-CM | POA: Diagnosis not present

## 2019-11-06 DIAGNOSIS — R000 Tachycardia, unspecified: Secondary | ICD-10-CM | POA: Diagnosis not present

## 2019-11-06 LAB — COMPREHENSIVE METABOLIC PANEL
ALT: 22 U/L (ref 0–44)
AST: 26 U/L (ref 15–41)
Albumin: 4 g/dL (ref 3.5–5.0)
Alkaline Phosphatase: 90 U/L (ref 38–126)
Anion gap: 8 (ref 5–15)
BUN: 19 mg/dL (ref 8–23)
CO2: 24 mmol/L (ref 22–32)
Calcium: 8.5 mg/dL — ABNORMAL LOW (ref 8.9–10.3)
Chloride: 104 mmol/L (ref 98–111)
Creatinine, Ser: 1.1 mg/dL (ref 0.61–1.24)
GFR calc Af Amer: >60 mL/min (ref >60)
GFR calc non Af Amer: >60 mL/min (ref >60)
Glucose, Bld: 142 mg/dL — ABNORMAL HIGH (ref 70–99)
Potassium: 4 mmol/L (ref 3.5–5.1)
Sodium: 136 mmol/L (ref 135–145)
Total Bilirubin: 0.8 mg/dL (ref 0.3–1.2)
Total Protein: 7.1 g/dL (ref 6.5–8.1)

## 2019-11-06 LAB — URINALYSIS, ROUTINE W REFLEX MICROSCOPIC
Bilirubin Urine: NEGATIVE
Glucose, UA: NEGATIVE mg/dL
Hgb urine dipstick: NEGATIVE
Ketones, ur: NEGATIVE mg/dL
Leukocytes,Ua: NEGATIVE
Nitrite: NEGATIVE
Protein, ur: NEGATIVE mg/dL
Specific Gravity, Urine: 1.016 (ref 1.005–1.030)
pH: 5 (ref 5.0–8.0)

## 2019-11-06 LAB — CBC WITH DIFFERENTIAL/PLATELET
Abs Immature Granulocytes: 0.01 10*3/uL (ref 0.00–0.07)
Basophils Absolute: 0 10*3/uL (ref 0.0–0.1)
Basophils Relative: 0 %
Eosinophils Absolute: 0 10*3/uL (ref 0.0–0.5)
Eosinophils Relative: 0 %
HCT: 38.7 % — ABNORMAL LOW (ref 39.0–52.0)
Hemoglobin: 11.8 g/dL — ABNORMAL LOW (ref 13.0–17.0)
Immature Granulocytes: 0 %
Lymphocytes Relative: 12 %
Lymphs Abs: 0.6 10*3/uL — ABNORMAL LOW (ref 0.7–4.0)
MCH: 21.7 pg — ABNORMAL LOW (ref 26.0–34.0)
MCHC: 30.5 g/dL (ref 30.0–36.0)
MCV: 71.3 fL — ABNORMAL LOW (ref 80.0–100.0)
Monocytes Absolute: 0.2 10*3/uL (ref 0.1–1.0)
Monocytes Relative: 4 %
Neutro Abs: 4.5 10*3/uL (ref 1.7–7.7)
Neutrophils Relative %: 84 %
Platelets: 231 10*3/uL (ref 150–400)
RBC: 5.43 MIL/uL (ref 4.22–5.81)
RDW: 15.7 % — ABNORMAL HIGH (ref 11.5–15.5)
WBC: 5.4 10*3/uL (ref 4.0–10.5)
nRBC: 0 % (ref 0.0–0.2)

## 2019-11-06 LAB — PROTIME-INR
INR: 1.1 (ref 0.8–1.2)
Prothrombin Time: 14.4 seconds (ref 11.4–15.2)

## 2019-11-06 LAB — MAGNESIUM: Magnesium: 2 mg/dL (ref 1.7–2.4)

## 2019-11-06 LAB — RESPIRATORY PANEL BY RT PCR (FLU A&B, COVID)
Influenza A by PCR: NEGATIVE
Influenza B by PCR: NEGATIVE
SARS Coronavirus 2 by RT PCR: NEGATIVE

## 2019-11-06 LAB — TROPONIN I (HIGH SENSITIVITY)
Troponin I (High Sensitivity): 13 ng/L (ref <18)
Troponin I (High Sensitivity): 17 ng/L (ref <18)

## 2019-11-06 LAB — TSH: TSH: 0.625 u[IU]/mL (ref 0.350–4.500)

## 2019-11-06 LAB — BRAIN NATRIURETIC PEPTIDE: B Natriuretic Peptide: 48 pg/mL (ref 0.0–100.0)

## 2019-11-06 NOTE — ED Notes (Signed)
Pt given a urinal and aware we need a specimen.

## 2019-11-06 NOTE — ED Triage Notes (Signed)
Dizziness onset 2pm

## 2019-11-06 NOTE — ED Notes (Signed)
Pt to CT

## 2019-11-06 NOTE — ED Provider Notes (Signed)
Emergency Department Provider Note   I have reviewed the triage vital signs and the nursing notes.   HISTORY  Chief Complaint Dizziness   HPI Joseph [name] is a 74 y.o. male with PMH of A-fib, 1st degree AV block, Bradycardia, and HTN presents emergency department with acute onset lightheadedness which began at approximately 2 PM this afternoon.  Patient's wife is at bedside and states that she noted he has been complaining over the past several days about "not feeling like himself" and being somewhat off balance.  Today, however, the patient states that he noticed significant lightheadedness without syncope.  He does not feel pain or pressure in the chest.  He is not experiencing heart palpitations or shortness of breath.  He is not experienced fevers or shaking chills.  He denies any recent medication changes. No vomiting, diarrhea, or abdominal pain.   Past Medical History:  Diagnosis Date  . 1st degree AV block   . Atrial fibrillation ([place])   . Diastolic dysfunction 123456   grade 1  . Hypertension   . New onset atrial fibrillation ([place]) 06/2015  . RBBB   . Sinus bradycardia   . Stroke [facility])    no defecits, pt denies having a stroke, says [name] diagnosed this    Patient Active Problem List   Diagnosis Date Noted  . 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  . History of adenomatous polyp of colon 02/27/2019  . Paroxysmal atrial fibrillation ([place]) 07/18/2015  . Bradycardia 06/29/2015  . Near syncope   . Atrial fibrillation with rapid ventricular response ([place]) 06/27/2015  . Left ventricular diastolic dysfunction, NYHA class 3 10/26/2014  . Kidney cysts 04/04/2013  . HTN (hypertension) 03/31/2013  .  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 Diastolic dysfunction- grade 2- echo 06/28/15 03/31/2013  . RBBB 03/31/2013  . AV block, 1st degree 03/31/2013  . Sinus bradycardia 03/31/2013  . Hematochezia 12/29/2011    Past Surgical History:  Procedure Laterality Date  .  COLONOSCOPY    . COLONOSCOPY  12/30/2011   Procedure: COLONOSCOPY;  Surgeon: [name], MD;  [facility];  Service: Endoscopy;  Laterality: N/A;  1:45PM  . COLONOSCOPY N/A 04/19/2019   Procedure: COLONOSCOPY;  Surgeon: [name], MD;  [facility];  Service: Endoscopy;  Laterality: N/A;  2:00pm  . EXCISION MASS LOWER EXTREMETIES Left 05/12/2018   Procedure: Left foot plantar fibroma excision;  Surgeon: [name], MD;  Location: [facility];  Service: Orthopedics;  Laterality: Left;  . FOOT SURGERY Left     Allergies Patient has no known allergies.  Family History  Problem Relation Age of Onset  . Cancer Brother   . CVA Mother   . Hypertension Father   . Colon cancer Neg Hx     Social History Social History   Tobacco Use  . Smoking status: Former Smoker    Packs/day: 0.00    Years: 4.00    Pack years: 0.00    Quit date: 2000    Years since quitting: 21.3  . Smokeless tobacco: Never Used  . Tobacco comment: quit about 40 yrs ago  Substance Use Topics  . Alcohol use: Yes    Alcohol/week: 0.0 standard drinks    Comment: 6 pack of beer or less in a week  . Drug use: No    Review of Systems  Constitutional: No fever/chills. Positive lightheadedness.  Eyes: No visual changes. ENT: No sore throat. Cardiovascular: Denies chest  pain.  Respiratory: Denies shortness of breath. Gastrointestinal: No abdominal pain.  No nausea, no vomiting.  No diarrhea.  No constipation. Genitourinary: Negative for dysuria. Musculoskeletal: Negative for back pain. Skin: Negative for rash. Neurological: Negative for headaches, focal weakness or numbness. Positive feeling off balance.   10-point ROS otherwise negative.  ____________________________________________   PHYSICAL EXAM:  VITAL SIGNS: ED Triage Vitals  Enc Vitals Group     BP 11/06/19 1855 (!) 147/75     Pulse Rate 11/06/19 1855 69     Resp 11/06/19 1855 20     Temp 11/06/19  1855 97.7 F (36.5 C)     Temp src --      SpO2 11/06/19 1855 100 %   Constitutional: Alert and oriented. Well appearing and in no acute distress. Eyes: Conjunctivae are normal.  Head: Atraumatic. Nose: No congestion/rhinnorhea. Mouth/Throat: Mucous membranes are moist.   Neck: No stridor.  Cardiovascular: Sinus rhythm with frequent PVCs. Good peripheral circulation. Grossly normal heart sounds.   Respiratory: Normal respiratory effort.  No retractions. Lungs CTAB. Gastrointestinal: Soft and nontender. No distention.  Musculoskeletal: No lower extremity tenderness with pitting edema to just below the knee.  Neurologic:  Normal speech and language.  No facial asymmetry.  Equal strength in the upper and lower extremities (5/5 bilaterally). Normal finger to nose and heel to shin.  Skin:  Skin is warm, dry and intact. No rash noted.  ____________________________________________   LABS (all labs ordered are listed, but only abnormal results are displayed)  Labs Reviewed  COMPREHENSIVE METABOLIC PANEL - Abnormal; Notable for the following components:      Result Value   Glucose, Bld 142 (*)    Calcium 8.5 (*)    All other components within normal limits  CBC WITH DIFFERENTIAL/PLATELET - Abnormal; Notable for the following components:   Hemoglobin 11.8 (*)    HCT 38.7 (*)    MCV 71.3 (*)    MCH 21.7 (*)    RDW 15.7 (*)    Lymphs Abs 0.6 (*)    All other components within normal limits  RESPIRATORY PANEL BY RT PCR (FLU A&B, COVID)  URINE CULTURE  BRAIN NATRIURETIC PEPTIDE  MAGNESIUM  TSH  URINALYSIS, ROUTINE W REFLEX MICROSCOPIC  PROTIME-INR  TROPONIN I (HIGH SENSITIVITY)  TROPONIN I (HIGH SENSITIVITY)   ____________________________________________  EKG   EKG Interpretation  Date/Time:  Monday November 06 2019 18:41:57 EDT Ventricular Rate:  65 PR Interval:    QRS Duration: 178 QT Interval:  512 QTC Calculation: 532 R Axis:   -80 Text Interpretation: Sinus rhythm Left  axis deviation Right bundle branch block Minimal voltage criteria for LVH, may be normal variant ( R in aVL ) Abnormal ECG Similar to 2016 tracing with new PVCs Confirmed by Nanda Quinton [phone]) on 11/06/2019 6:49:03 PM       ____________________________________________  RADIOLOGY  DG Chest 1 View  Result Date: 11/06/2019 CLINICAL DATA:  Shortness of breath. EXAM: CHEST  1 VIEW COMPARISON:  June 27, 2015. FINDINGS: Mild cardiomegaly. No pneumothorax or pleural effusion is noted. Both lungs are clear. The visualized skeletal structures are unremarkable. IMPRESSION: No active disease. Electronically Signed   By: Marijo Conception M.D.   On: 11/06/2019 20:19   CT Head Wo Contrast  Result Date: 11/06/2019 CLINICAL DATA:  Dizziness EXAM: CT HEAD WITHOUT CONTRAST TECHNIQUE: Contiguous axial images were obtained from the base of the skull through the vertex without intravenous contrast. COMPARISON:  MRI 03/09/2011 FINDINGS: Brain: No acute  territorial infarction, hemorrhage or intracranial mass. Mild atrophy. Mild hypodensity in the white matter consistent with chronic small vessel ischemic change. Probable chronic lacunar infarcts within the bilateral white matter. Nonenlarged ventricles Vascular: No hyperdense vessels.  Carotid vascular calcification Skull: Normal. Negative for fracture or focal lesion. Sinuses/Orbits: No acute finding. Other: None IMPRESSION: 1. No CT evidence for acute intracranial abnormality. 2. 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 Probable chronic lacunar infarcts within the bilateral white matter. Electronically Signed   By: Donavan Foil M.D.   On: 11/06/2019 20:17    ____________________________________________   PROCEDURES  Procedure(s) performed:   Procedures  None ____________________________________________   INITIAL IMPRESSION / ASSESSMENT AND PLAN / ED COURSE  Pertinent labs & imaging results that were available during my care of the  patient were reviewed by me and considered in my medical decision making (see chart for details).   Patient presents emergency department for evaluation of lightheadedness which began this afternoon.  He has no focal neurologic deficits on my exam. Plan for CT head with some "balance issues" in the last several days but differential is broad at this time.  My ability to review his most recent EKGs is limited.  I checked both epic and Muse but I am only able to review EKGs from 2016.  While in the room the patient developed a more rapid tachycardia with rate into the 140s but remained asymptomatic through this and morphology on monitor did not change.   Reviewed EKG with Dr. Paticia Stack with Cardiology. Similar to prior. Additional history became available from family. Patient tells me that he ate a brownie at home which he thought was normal but actually contained CBD/THC and was made by a family member. It was shortly afterwards that his symptoms developed.   On reassessment the patient is feeling improved. Telemetry monitoring in the ED complete. Labs and CXR along with CT head interpreted. No acute findings. Plan for close PCP and Cardiology follow up. Discussed ED return precautions. Patient with edema in the legs but none on CXR. No hypoxemia. Plan to re-start spironolactone and f/u with Cardiology in the coming days.   ____________________________________________  FINAL CLINICAL IMPRESSION(S) / ED DIAGNOSES  Final diagnoses:  Dizziness  Swelling of lower extremity    Note:  This document was prepared using Dragon voice recognition software and may include unintentional dictation errors.  Nanda Quinton, MD, [facility] Emergency Medicine    Kamir Selover, [facility], MD 11/07/19 [phone]

## 2019-11-06 NOTE — Discharge Instructions (Signed)
You were seen in the emergency department today with dizziness.  Your lab work and EKG are reassuring.  You do have some fluid building up in your legs.  Please keep these elevated and use compression stockings at home.  Please take your spironolactone as prescribed by your cardiologist.  Please call your cardiologist in the morning to schedule a follow-up appointment.  Return to the emergency department any new or suddenly worsening symptoms.

## 2019-11-07 NOTE — Patient Instructions (Signed)
TRUEN SCHAUL  11/07/2019     @PREFPERIOPPHARMACY @   Your procedure is scheduled on   11/13/2019   Report to [facility] at  [place].M.  Call this number if you have problems the morning of surgery:  [phone]   Remember:  Do not eat or drink after midnight.                        Take these medicines the morning of surgery with A SIP OF WATER  avapro.    Do not wear jewelry, make-up or nail polish.  Do not wear lotions, powders, or perfumes. Please wear deodorant and brush your teeth.  Do not shave 48 hours prior to surgery.  Men may shave face and neck.  Do not bring valuables to the hospital.  [facility] is not responsible for any belongings or valuables.  Contacts, dentures or bridgework may not be worn into surgery.  Leave your suitcase in the car.  After surgery it may be brought to your room.  For patients admitted to the hospital, discharge time will be determined by your treatment team.  Patients discharged the day of surgery will not be allowed to drive home.   Name and phone number of your driver:   family Special instructions:  DO NOT smoke the day of your procedure.  Please read over the following fact sheets that you were given. Anesthesia Post-op Instructions and Care and Recovery After Surgery       Incision Care, Adult An incision is a cut that a doctor makes in your skin for surgery (for a procedure). Most times, these cuts are closed after surgery. Your cut from surgery may be closed with stitches (sutures), staples, skin glue, or skin tape (adhesive strips). You may need to return to your doctor to have stitches or staples taken out. This may happen many days or many weeks after your surgery. The cut needs to be well cared for so it does not get infected. How to care for your cut Cut care   Follow instructions from your doctor about how to take care of your cut. Make sure you: ? Wash your hands with soap and water before you  change your bandage (dressing). If you cannot use soap and water, use hand sanitizer. ? Change your bandage as told by your doctor. ? Leave stitches, skin glue, or skin tape in place. They may need to stay in place for 2 weeks or longer. If tape strips get loose and curl up, you may trim the loose edges. Do not remove tape strips completely unless your doctor says it is okay.  Check your cut area every day for signs of infection. Check for: ? More redness, swelling, or pain. ? More fluid or blood. ? Warmth. ? Pus or a bad smell.  Ask your doctor how to clean the cut. This may include: ? Using mild soap and water. ? Using a clean towel to pat the cut dry after you clean it. ? Putting a cream or ointment on the cut. Do this only as told by your doctor. ? Covering the cut with a clean bandage.  Ask your doctor when you can leave the cut uncovered.  Do not take baths, swim, or use a hot tub until your doctor says it is okay. Ask your doctor if you can take showers. You may only be allowed to take  sponge baths for bathing. Medicines  If you were prescribed an antibiotic medicine, cream, or ointment, take the antibiotic or put it on the cut as told by your doctor. Do not stop taking or putting on the antibiotic even if your condition gets better.  Take over-the-counter and prescription medicines only as told by your doctor. General instructions  Limit movement around your cut. This helps healing. ? Avoid straining, lifting, or exercise for the first month, or for as long as told by your doctor. ? Follow instructions from your doctor about going back to your normal activities. ? Ask your doctor what activities are safe.  Protect your cut from the sun when you are outside for the first 6 months, or for as long as told by your doctor. Put on sunscreen around the scar or cover up the scar.  Keep all follow-up visits as told by your doctor. This is important. Contact a doctor if:  Your have  more redness, swelling, or pain around the cut.  You have more fluid or blood coming from the cut.  Your cut feels warm to the touch.  You have pus or a bad smell coming from the cut.  You have a fever or shaking chills.  You feel sick to your stomach (nauseous) or you throw up (vomit).  You are dizzy.  Your stitches or staples come undone. Get help right away if:  You have a red streak coming from your cut.  Your cut bleeds through the bandage and the bleeding does not stop with gentle pressure.  The edges of your cut open up and separate.  You have very bad (severe) pain.  You have a rash.  You are confused.  You pass out (faint).  You have trouble breathing and you have a fast heartbeat. This information is not intended to replace advice given to you by your health care provider. Make sure you discuss any questions you have with your health care provider. Document Revised: 11/16/2016 Document Reviewed: 03/06/2016 Elsevier Patient Education  [place]  A spermatocele is a fluid-filled sac (cyst) inside the scrotum. This type of cyst often forms at the top of the testicle where sperm is stored (epididymis). The cyst sometimes forms along the tube that carries sperm away from the epididymis (vas deferens). Spermatoceles are usually painless. Most cysts are small, but they can grow larger. Spermatoceles are not cancerous (are benign). What are the causes? The cause of this condition is not known. What are the signs or symptoms? In most cases, small cysts do not cause symptoms. If you do have symptoms, they may include:  Dull pain.  A feeling of heaviness.  An enlargement of your scrotum, if the cyst is large. How is this diagnosed? This condition is diagnosed based on a physical exam. You or your health care provider may notice the cyst when feeling your scrotum. Your provider may shine a light through (transilluminate) your scrotum to see if  light will pass through the cyst. You may also have an ultrasound of your scrotum to rule out a tumor. How is this treated? Small spermatoceles do not need to be treated. If the spermatocele has grown large or is uncomfortable, surgery to remove the cyst may be recommended. Follow these instructions at home:  Watch your spermatocele for any changes.  Keep all follow-up visits as told by your health care provider. This is important. Contact a health care provider if:  Your spermatocele gets larger.  You  have pain in your scrotum.  Your spermatocele comes back after treatment. This information is not intended to replace advice given to you by your health care provider. Make sure you discuss any questions you have with your health care provider. Document Revised: 06/11/2017 Document Reviewed: 07/14/2015 Elsevier Patient Education  2020 [place] Anesthesia, Adult, Care After This sheet gives you information about how to care for yourself after your procedure. Your health care provider may also give you more specific instructions. If you have problems or questions, contact your health care provider. What can I expect after the procedure? After the procedure, the following side effects are common:  Pain or discomfort at the IV site.  Nausea.  Vomiting.  Sore throat.  Trouble concentrating.  Feeling cold or chills.  Weak or tired.  Sleepiness and fatigue.  Soreness and body aches. These side effects can affect parts of the body that were not involved in surgery. Follow these instructions at home:  For at least 24 hours after the procedure:  Have a responsible adult stay with you. It is important to have someone help care for you until you are awake and alert.  Rest as needed.  Do not: ? Participate in activities in which you could fall or become injured. ? Drive. ? Use heavy machinery. ? Drink alcohol. ? Take sleeping pills or medicines that cause  drowsiness. ? Make important decisions or sign legal documents. ? Take care of children on your own. Eating and drinking  Follow any instructions from your health care provider about eating or drinking restrictions.  When you feel hungry, start by eating small amounts of foods that are soft and easy to digest (bland), such as toast. Gradually return to your regular diet.  Drink enough fluid to keep your urine pale yellow.  If you vomit, rehydrate by drinking water, juice, or clear broth. General instructions  If you have sleep apnea, surgery and certain medicines can increase your risk for breathing problems. Follow instructions from your health care provider about wearing your sleep device: ? Anytime you are sleeping, including during daytime naps. ? While taking prescription pain medicines, sleeping medicines, or medicines that make you drowsy.  Return to your normal activities as told by your health care provider. Ask your health care provider what activities are safe for you.  Take over-the-counter and prescription medicines only as told by your health care provider.  If you smoke, do not smoke without supervision.  Keep all follow-up visits as told by your health care provider. This is important. Contact a health care provider if:  You have nausea or vomiting that does not get better with medicine.  You cannot eat or drink without vomiting.  You have pain that does not get better with medicine.  You are unable to pass urine.  You develop a skin rash.  You have a fever.  You have redness around your IV site that gets worse. Get help right away if:  You have difficulty breathing.  You have chest pain.  You have blood in your urine or stool, or you vomit blood. Summary  After the procedure, it is common to have a sore throat or nausea. It is also common to feel tired.  Have a responsible adult stay with you for the first 24 hours after general anesthesia. It is  important to have someone help care for you until you are awake and alert.  When you feel hungry, start by eating small amounts of  foods that are soft and easy to digest (bland), such as toast. Gradually return to your regular diet.  Drink enough fluid to keep your urine pale yellow.  Return to your normal activities as told by your health care provider. Ask your health care provider what activities are safe for you. This information is not intended to replace advice given to you by your health care provider. Make sure you discuss any questions you have with your health care provider. Document Revised: 07/02/2017 Document Reviewed: 02/12/2017 Elsevier Patient Education  [place]. How to Use Chlorhexidine for Bathing Chlorhexidine gluconate (CHG) is a germ-killing (antiseptic) solution that is used to clean the skin. It can get rid of the bacteria that normally live on the skin and can keep them away for about 24 hours. To clean your skin with CHG, you may be given:  A CHG solution to use in the shower or as part of a sponge bath.  A prepackaged cloth that contains CHG. Cleaning your skin with CHG may help lower the risk for infection:  While you are staying in the intensive care unit of the hospital.  If you have a vascular access, such as a central line, to provide short-term or long-term access to your veins.  If you have a catheter to drain urine from your bladder.  If you are on a ventilator. A ventilator is a machine that helps you breathe by moving air in and out of your lungs.  After surgery. What are the risks? Risks of using CHG include:  A skin reaction.  Hearing loss, if CHG gets in your ears.  Eye injury, if CHG gets in your eyes and is not rinsed out.  The CHG product catching fire. Make sure that you avoid smoking and flames after applying CHG to your skin. Do not use CHG:  If you have a chlorhexidine allergy or have previously reacted to  chlorhexidine.  On babies younger than 5 months of age. How to use CHG solution  Use CHG only as told by your health care provider, and follow the instructions on the label.  Use the full amount of CHG as directed. Usually, this is one bottle. During a shower Follow these steps when using CHG solution during a shower (unless your health care provider gives you different instructions): 1. Start the shower. 2. Use your normal soap and shampoo to wash your face and hair. 3. Turn off the shower or move out of the shower stream. 4. Pour the CHG onto a clean washcloth. Do not use any type of brush or rough-edged sponge. 5. Starting at your neck, lather your body down to your toes. Make sure you follow these instructions: ? If you will be having surgery, pay special attention to the part of your body where you will be having surgery. Scrub this area for at least 1 minute. ? Do not use CHG on your head or face. If the solution gets into your ears or eyes, rinse them well with water. ? Avoid your genital area. ? Avoid any areas of skin that have broken skin, cuts, or scrapes. ? Scrub your back and under your arms. Make sure to wash skin folds. 6. Let the lather sit on your skin for 1-2 minutes or as long as told by your health care provider. 7. Thoroughly rinse your entire body in the shower. Make sure that all body creases and crevices are rinsed well. 8. Dry off with a clean towel. Do not put  any substances on your body afterward--such as powder, lotion, or perfume--unless you are told to do so by your health care provider. Only use lotions that are recommended by the manufacturer. 9. Put on clean clothes or pajamas. 10. If it is the night before your surgery, sleep in clean sheets.  During a sponge bath Follow these steps when using CHG solution during a sponge bath (unless your health care provider gives you different instructions): 1. Use your normal soap and shampoo to wash your face and  hair. 2. Pour the CHG onto a clean washcloth. 3. Starting at your neck, lather your body down to your toes. Make sure you follow these instructions: ? If you will be having surgery, pay special attention to the part of your body where you will be having surgery. Scrub this area for at least 1 minute. ? Do not use CHG on your head or face. If the solution gets into your ears or eyes, rinse them well with water. ? Avoid your genital area. ? Avoid any areas of skin that have broken skin, cuts, or scrapes. ? Scrub your back and under your arms. Make sure to wash skin folds. 4. Let the lather sit on your skin for 1-2 minutes or as long as told by your health care provider. 5. Using a different clean, wet washcloth, thoroughly rinse your entire body. Make sure that all body creases and crevices are rinsed well. 6. Dry off with a clean towel. Do not put any substances on your body afterward--such as powder, lotion, or perfume--unless you are told to do so by your health care provider. Only use lotions that are recommended by the manufacturer. 7. Put on clean clothes or pajamas. 8. If it is the night before your surgery, sleep in clean sheets. How to use CHG prepackaged cloths  Only use CHG cloths as told by your health care provider, and follow the instructions on the label.  Use the CHG cloth on clean, dry skin.  Do not use the CHG cloth on your head or face unless your health care provider tells you to.  When washing with the CHG cloth: ? Avoid your genital area. ? Avoid any areas of skin that have broken skin, cuts, or scrapes. Before surgery Follow these steps when using a CHG cloth to clean before surgery (unless your health care provider gives you different instructions): 1. Using the CHG cloth, vigorously scrub the part of your body where you will be having surgery. Scrub using a back-and-forth motion for 3 minutes. The area on your body should be completely wet with CHG when you are done  scrubbing. 2. Do not rinse. Discard the cloth and let the area air-dry. Do not put any substances on the area afterward, such as powder, lotion, or perfume. 3. Put on clean clothes or pajamas. 4. If it is the night before your surgery, sleep in clean sheets.  For general bathing Follow these steps when using CHG cloths for general bathing (unless your health care provider gives you different instructions). 1. Use a separate CHG cloth for each area of your body. Make sure you wash between any folds of skin and between your fingers and toes. Wash your body in the following order, switching to a new cloth after each step: ? The front of your neck, shoulders, and chest. ? Both of your arms, under your arms, and your hands. ? Your stomach and groin area, avoiding the genitals. ? Your right leg and foot. ?  Your left leg and foot. ? The back of your neck, your back, and your buttocks. 2. Do not rinse. Discard the cloth and let the area air-dry. Do not put any substances on your body afterward--such as powder, lotion, or perfume--unless you are told to do so by your health care provider. Only use lotions that are recommended by the manufacturer. 3. Put on clean clothes or pajamas. Contact a health care provider if:  Your skin gets irritated after scrubbing.  You have questions about using your solution or cloth. Get help right away if:  Your eyes become very red or swollen.  Your eyes itch badly.  Your skin itches badly and is red or swollen.  Your hearing changes.  You have trouble seeing.  You have swelling or tingling in your mouth or throat.  You have trouble breathing.  You swallow any chlorhexidine. Summary  Chlorhexidine gluconate (CHG) is a germ-killing (antiseptic) solution that is used to clean the skin. Cleaning your skin with CHG may help to lower your risk for infection.  You may be given CHG to use for bathing. It may be in a bottle or in a prepackaged cloth to use on  your skin. Carefully follow your health care provider's instructions and the instructions on the product label.  Do not use CHG if you have a chlorhexidine allergy.  Contact your health care provider if your skin gets irritated after scrubbing. This information is not intended to replace advice given to you by your health care provider. Make sure you discuss any questions you have with your health care provider. Document Revised: 09/15/2018 Document Reviewed: 05/27/2017 Elsevier Patient Education  [place].

## 2019-11-08 ENCOUNTER — Encounter (HOSPITAL_COMMUNITY): Payer: Self-pay

## 2019-11-08 LAB — URINE CULTURE: Culture: NO GROWTH

## 2019-11-08 NOTE — Anesthesia Preprocedure Evaluation (Addendum)
Anesthesia Evaluation  Patient identified by MRN, date of birth, ID band Patient awake    Reviewed: Allergy & Precautions, H&P , NPO status , Patient's Chart, lab work & pertinent test results, reviewed documented beta blocker date and time   Airway Mallampati: II  TM Distance: >3 FB Neck ROM: full    Dental no notable dental hx.    Pulmonary neg pulmonary ROS, former smoker,    Pulmonary exam normal breath sounds clear to auscultation       Cardiovascular Exercise Tolerance: Good hypertension, + dysrhythmias Atrial Fibrillation  Rhythm:regular Rate:Normal     Neuro/Psych TIAnegative psych ROS   GI/Hepatic negative GI ROS, Neg liver ROS,   Endo/Other  negative endocrine ROS  Renal/GU CRFRenal disease  negative genitourinary   Musculoskeletal   Abdominal   Peds  Hematology negative hematology ROS (+)   Anesthesia Other Findings   Reproductive/Obstetrics negative OB ROS                            Anesthesia Physical Anesthesia Plan  ASA: III  Anesthesia Plan: General   Post-op Pain Management:    Induction:   PONV Risk Score and Plan: 2 and Ondansetron  Airway Management Planned:   Additional Equipment:   Intra-op Plan:   Post-operative Plan:   Informed Consent: I have reviewed the patients History and Physical, chart, labs and discussed the procedure including the risks, benefits and alternatives for the proposed anesthesia with the patient or authorized representative who has indicated his/her understanding and acceptance.     Dental Advisory Given  Plan Discussed with: CRNA  Anesthesia Plan Comments:         Anesthesia Quick Evaluation

## 2019-11-09 ENCOUNTER — Encounter (HOSPITAL_COMMUNITY)
Admission: RE | Admit: 2019-11-09 | Discharge: 2019-11-09 | Disposition: A | Discharge status: Home / Self Care | Payer: Medicare PPO | Source: Ambulatory Visit | Attending: Urology | Admitting: Urology

## 2019-11-09 ENCOUNTER — Encounter (HOSPITAL_COMMUNITY): Payer: Self-pay

## 2019-11-09 ENCOUNTER — Other Ambulatory Visit: Payer: Self-pay

## 2019-11-09 DIAGNOSIS — Z01810 Encounter for preprocedural cardiovascular examination: Secondary | ICD-10-CM | POA: Diagnosis not present

## 2019-11-09 DIAGNOSIS — I4891 Unspecified atrial fibrillation: Secondary | ICD-10-CM | POA: Diagnosis not present

## 2019-11-10 ENCOUNTER — Ambulatory Visit (INDEPENDENT_AMBULATORY_CARE_PROVIDER_SITE_OTHER): Payer: Medicare PPO | Admitting: Physician Assistant

## 2019-11-10 ENCOUNTER — Other Ambulatory Visit (HOSPITAL_COMMUNITY)
Admission: RE | Admit: 2019-11-10 | Discharge: 2019-11-10 | Disposition: A | Discharge status: Home / Self Care | Payer: Medicare PPO | Source: Ambulatory Visit | Attending: Urology | Admitting: Urology

## 2019-11-10 ENCOUNTER — Encounter: Payer: Self-pay | Admitting: Physician Assistant

## 2019-11-10 VITALS — HR 50 | Temp 97.8°F | Ht 70.0 in | Wt 213.6 lb

## 2019-11-10 DIAGNOSIS — I480 Paroxysmal atrial fibrillation: Secondary | ICD-10-CM

## 2019-11-10 DIAGNOSIS — I10 Essential (primary) hypertension: Secondary | ICD-10-CM | POA: Diagnosis not present

## 2019-11-10 DIAGNOSIS — Z01812 Encounter for preprocedural laboratory examination: Secondary | ICD-10-CM | POA: Insufficient documentation

## 2019-11-10 DIAGNOSIS — R42 Dizziness and giddiness: Secondary | ICD-10-CM

## 2019-11-10 DIAGNOSIS — I5032 Chronic diastolic (congestive) heart failure: Secondary | ICD-10-CM

## 2019-11-10 DIAGNOSIS — Z01811 Encounter for preprocedural respiratory examination: Secondary | ICD-10-CM | POA: Diagnosis present

## 2019-11-10 DIAGNOSIS — R001 Bradycardia, unspecified: Secondary | ICD-10-CM | POA: Diagnosis not present

## 2019-11-10 DIAGNOSIS — Z01810 Encounter for preprocedural cardiovascular examination: Secondary | ICD-10-CM

## 2019-11-10 DIAGNOSIS — Z20822 Contact with and (suspected) exposure to covid-19: Secondary | ICD-10-CM | POA: Insufficient documentation

## 2019-11-10 NOTE — Patient Instructions (Addendum)
Medication Instructions:   TAKE Eliquis on Saturday morning and hold evening and other doses until after surgery  Your physician recommends that you continue on your current medications as directed. Please refer to the Current Medication list given to you today.  *If you need a refill on your cardiac medications before your next appointment, please call your pharmacy*  Lab Work: NONE ordered at this time of appointment   If you have labs (blood work) drawn today and your tests are completely normal, you will receive your results only by: Marland Kitchen MyChart Message (if you have MyChart) OR . A paper copy in the mail If you have any lab test that is abnormal or we need to change your treatment, we will call you to review the results.  Testing/Procedures: NONE ordered at this time of appointment   Follow-Up: At [facility], you and your health needs are our priority.  As part of our continuing mission to provide you with exceptional heart care, we have created designated Provider Care Teams.  These Care Teams include your primary Cardiologist (physician) and Advanced Practice Providers (APPs -  Physician Assistants and Nurse Practitioners) who all work together to provide you with the care you need, when you need it.  We recommend signing up for the patient portal called "MyChart".  Sign up information is provided on this After Visit Summary.  MyChart is used to connect with patients for Virtual Visits (Telemedicine).  Patients are able to view lab/test results, encounter notes, upcoming appointments, etc.  Non-urgent messages can be sent to your provider as well.   To learn more about what you can do with MyChart, go to [email or web address].    Your next appointment:   4-5 month(s)  The format for your next appointment:   In Person  Provider:   Shelva Majestic, MD  Other Instructions   Elevate your legs to help with swelling, if leg elevation does not help with the swelling try wearing  compression stocking to help with the swelling.

## 2019-11-10 NOTE — Progress Notes (Signed)
Cardiology Office Note:    Date:  11/12/2019   ID:  Joseph [name], DOB 1946/03/14, MRN [id number]  PCP:  Lemmie Evens, MD  Cardiologist:  Shelva Majestic, MD  Electrophysiologist:  None   Referring MD: Lemmie Evens, MD   Chief Complaint  Patient presents with  . Follow-up    seen for Dr. Claiborne Billings.     History of Present Illness:    Joseph [name] is a 74 y.o. male with a hx of HTN, rheumatic heart disease, RBBB, bradycardia, diastolic dysfunction, CVA, left renal cyst and atrial fibrillation.  CT scan in 2013 suggested a small lacunar infarction likely due to hypertension. He was first diagnosed with atrial fibrillation in December 2016. He was started on IV Cardizem, this was later discontinued as he bradycardia down to the 40s. Echocardiogram obtained on 06/28/2015 revealed EF [id number], grade 2 diastolic dysfunction. He was discharged on Eliquis. Myoview obtained on 06/29/2015 showed EF 42%, nonreversible decreased myocardial perfusion in the inferior wall, intermediate risk stress test finding.  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  Last echocardiogram obtained in February 2020 showed EF 55 to 60%, mild LVH, mild LAE.  He was last seen by Dr. Claiborne Billings in March 2021, EKG at the time showed a junctional rhythm with intermittent P wave.  It was recommended for the patient to proceed with a 2-week ZIO monitor to further assess.  Heart monitor demonstrated minimal heart rate 36, maximum heart rate 190, average heart rate 68.  Single episode of 5 beats run of nonsustained VT.  Multiple episodes of supraventricular tachycardia.  Patient recently was seen in the ED on 11/06/2019 with dizziness after eating a brownie at home which was laced with CBD/THC.  Head CT was normal.  Patient presents today for cardiology office visit.  He has not had  any further dizziness since he left the emergency room.  I suspect his dizziness was not related to the bradycardia.  Therefore we will hold off on referring the patient to the EP service at this time.  He was already cleared for next Monday's spermatic cord surgery.  Our clinical pharmacist and Dr. Claiborne Billings has reviewed the surgery and recommend he hold Eliquis for 36 hours prior to the procedure.  Otherwise, his only remaining concern is occasional leg swelling.  On physical exam, he only has trace amount of edema.  I recommend a more conservative management such as low-salt diet, leg elevation and compression stocking.  Overall he has been doing well from cardiology service perspective, and can follow-up with Dr. Claiborne Billings in 4 to 5 months.  He is aware that if he started having increasing dizzy spell without change in the body position, he will need to let us know so we can evaluate him for possible symptomatic bradycardia.  Past Medical History:  Diagnosis Date  . 1st degree AV block   . Atrial fibrillation ([place])   . Diastolic dysfunction [id number]   grade 1  . Hypertension   . New onset atrial fibrillation ([place]) 06/2015  . RBBB   . Sinus bradycardia   . Stroke [facility])    no defecits, pt denies having a stroke, says [name] diagnosed this    Past Surgical History:  Procedure Laterality Date  . COLONOSCOPY    . COLONOSCOPY  12/30/2011   Procedure: COLONOSCOPY;  Surgeon: [name],  MD;  [facility];  Service: Endoscopy;  Laterality: N/A;  1:45PM  . COLONOSCOPY N/A 04/19/2019   Procedure: COLONOSCOPY;  Surgeon: [name], MD;  Location[facility];  Service: Endoscopy;  Laterality: N/A;  2:00pm  . EXCISION MASS LOWER EXTREMETIES Left 05/12/2018   Procedure: Left foot plantar fibroma excision;  Surgeon: [name], MD;  Location: [place];  Service: Orthopedics;  Laterality: Left;  . FOOT SURGERY Left     Current Medications: Current Meds  Medication Sig    . amLODipine (NORVASC) 10 MG tablet Take 1 tablet (10 mg total) by mouth daily. (Patient taking differently: Take 10 mg by mouth every evening. )  . apixaban (ELIQUIS) 5 MG TABS tablet Take 1 tablet (5 mg total) by mouth 2 (two) times daily.  . cholecalciferol (VITAMIN D) 25 MCG (1000 UT) tablet Take 1,000 Units by mouth daily.  . Coenzyme Q10 (COQ10) 200 MG CAPS Take 200 mg by mouth daily.  Marland Kitchen doxazosin (CARDURA) 8 MG tablet Take 8 mg by mouth at bedtime.   . irbesartan (AVAPRO) 300 MG tablet TAKE 1 TABLET BY MOUTH DAILY (Patient taking differently: Take 300 mg by mouth daily. )  . minoxidil (LONITEN) 2.5 MG tablet Take 5 mg by mouth daily.   . Misc Natural Products (PROSTATE HEALTH PO) Take 1 capsule by mouth daily. Super Beta Prostate  . Multiple Vitamin (MULTIVITAMIN WITH MINERALS) TABS tablet Take 1 tablet by mouth daily.  Vladimir Faster Glycol-Propyl Glycol (LUBRICANT EYE DROPS) 0.4-0.3 % SOLN Place 1 drop into both eyes 3 (three) times daily as needed (dry/irritated eyes.).  Marland Kitchen saw palmetto 500 MG capsule Take 500 mg by mouth every evening.  Marland Kitchen spironolactone (ALDACTONE) 25 MG tablet Take 0.5 tablets (12.5 mg total) by mouth daily. (Patient taking differently: Take 12.5 mg by mouth daily as needed (for fluid). )  . Turmeric 500 MG CAPS Take 500 mg by mouth daily.  . vitamin B-12 (CYANOCOBALAMIN) 500 MCG tablet Take 500 mcg by mouth daily.     Allergies:   Patient has no known allergies.   Social History   Socioeconomic History  . Marital status: Married    Spouse name: Not on file  . Number of children: 5  . Years of education: Not on file  . Highest education level: Not on file  Occupational History  . Not on file  Tobacco Use  . Smoking status: Former Smoker    Packs/day: 0.00    Years: 4.00    Pack years: 0.00    Quit date: 2000    Years since quitting: 21.3  . Smokeless tobacco: Never Used  . Tobacco comment: quit about 40 yrs ago  Substance and Sexual Activity  . Alcohol  use: Yes    Alcohol/week: 0.0 standard drinks    Comment: 6 pack of beer or less in a week  . Drug use: No  . Sexual activity: Not on file  Other Topics Concern  . Not on file  Social History Narrative  . Not on file   Social Determinants of Health   Financial Resource Strain:   . Difficulty of Paying Living Expenses:   Food Insecurity:   . Worried About Charity fundraiser in the Last Year:   . Arboriculturist in the Last Year:   Transportation Needs:   . Film/video editor (Medical):   Marland Kitchen Lack of Transportation (Non-Medical):   Physical Activity:   . Days of  Exercise per Week:   . Minutes of Exercise per Session:   Stress:   . Feeling of Stress :   Social Connections:   . Frequency of Communication with Friends and Family:   . Frequency of Social Gatherings with Friends and Family:   . Attends Religious Services:   . Active Member of Clubs or Organizations:   . Attends Archivist Meetings:   Marland Kitchen Marital Status:      Family History: The patient's family history includes CVA in his mother; Cancer in his brother; Hypertension in his father. There is no history of Colon cancer.  ROS:   Please see the history of present illness.     All other systems reviewed and are negative.  EKGs/Labs/Other Studies Reviewed:    The following studies were reviewed today:  Echo 08/29/2018 IMPRESSIONS    1. The left ventricle has normal systolic function, with an ejection  fraction of 55-60%. The cavity size was normal. There is mild concentric  left ventricular hypertrophy. Left ventricular diastology could not be  evaluated due to nondiagnostic images.  2. The right ventricle has normal systolic function. The cavity was  mildly enlarged. There is no increase in right ventricular wall thickness.  Right ventricular systolic pressure normal with an estimated pressure of  18.1 mmHg.  3. Left atrial size was mildly dilated.  4. The mitral valve is normal in structure.   5. The tricuspid valve is normal in structure.  6. The aortic valve is tricuspid Mild thickening of the aortic valve  Sclerosis without any evidence of stenosis of the aortic valve.  7. Right atrial pressure is estimated at 3 mmHg.   EKG:  EKG is not ordered today.    Recent Labs: 11/06/2019: ALT 22; B Natriuretic Peptide 48.0; BUN 19; Creatinine, Ser 1.10; Hemoglobin 11.8; Magnesium 2.0; Platelets 231; Potassium 4.0; Sodium 136; TSH 0.625  Recent Lipid Panel    Component Value Date/Time   CHOL 165 08/15/2019 1410   TRIG 56 08/15/2019 1410   HDL 73 08/15/2019 1410   CHOLHDL 2.3 08/15/2019 1410   LDLCALC 81 08/15/2019 1410    Physical Exam:    VS:  Pulse (!) 50   Temp 97.8 F (36.6 C) Comment: Forehead  Ht 5\' 10"  (1.778 m)   Wt 213 lb 9.6 oz (96.9 kg)   SpO2 99%   BMI 30.65 kg/m     Wt Readings from Last 3 Encounters:  11/10/19 213 lb 9.6 oz (96.9 kg)  11/09/19 210 lb (95.3 kg)  10/16/19 212 lb (96.2 kg)     GEN:  Well nourished, well developed in no acute distress HEENT: Normal NECK: No JVD; No carotid bruits LYMPHATICS: No lymphadenopathy CARDIAC: RRR, no murmurs, rubs, gallops RESPIRATORY:  Clear to auscultation without rales, wheezing or rhonchi  ABDOMEN: Soft, non-tender, non-distended MUSCULOSKELETAL:  No edema; No deformity  SKIN: Warm and dry NEUROLOGIC:  Alert and oriented x 3 PSYCHIATRIC:  Normal affect   ASSESSMENT:    1. Paroxysmal atrial fibrillation (HCC)   2. Essential hypertension   3. Chronic diastolic heart failure ([place])   4. Bradycardia   5. Dizziness   6. Preop cardiovascular exam    PLAN:    In order of problems listed above:  1. Paroxysmal atrial fibrillation: On Eliquis.  Not on any rate control medication due to history of relative bradycardia.  Patient is aware that he will need to hold Eliquis for 36 hours prior to next Monday surgery.  Last  dose of Eliquis would be Saturday morning  2. Hypertension: Continue on current  therapy  3. Chronic diastolic heart failure: Euvolemic on physical exam  4. Bradycardia: He has a history of relative bradycardia.  He underwent recent heart monitor which recorded lowest heart rate 36, maximum heart rate 190, average heart rate is 68.  At this time, there is no clear evidence that his recent dizziness is caused by bradycardia, I do not think he necessarily require a pacemaker at this time.  We discussed signs and symptoms of symptomatic bradycardia, he is aware to contact cardiology if he does develop dizziness, blurred vision or feeling of passing out without any body position changes.  5. Dizziness: Recently he was seen in the ED for episode of dizziness after eating of his family members Brownie from the fridge that was laced with THC and CBD  6. Preoperative clearance: He has spermatic cord surgery next Monday.  He was already cleared previously for the same procedure.  He is aware that he will need to hold Eliquis for 36 hours prior to the surgery and restart as soon as possible after the surgery at the discretion of the surgeon.   Medication Adjustments/Labs and Tests Ordered: Current medicines are reviewed at length with the patient today.  Concerns regarding medicines are outlined above.  No orders of the defined types were placed in this encounter.  No orders of the defined types were placed in this encounter.   Patient Instructions  Medication Instructions:   TAKE Eliquis on Saturday morning and hold evening and other doses until after surgery  Your physician recommends that you continue on your current medications as directed. Please refer to the Current Medication list given to you today.  *If you need a refill on your cardiac medications before your next appointment, please call your pharmacy*  Lab Work: NONE ordered at this time of appointment   If you have labs (blood work) drawn today and your tests are completely normal, you will receive your results  only by: Marland Kitchen MyChart Message (if you have MyChart) OR . A paper copy in the mail If you have any lab test that is abnormal or we need to change your treatment, we will call you to review the results.  Testing/Procedures: NONE ordered at this time of appointment   Follow-Up: At [facility], you and your health needs are our priority.  As part of our continuing mission to provide you with exceptional heart care, we have created designated Provider Care Teams.  These Care Teams include your primary Cardiologist (physician) and Advanced Practice Providers (APPs -  Physician Assistants and Nurse Practitioners) who all work together to provide you with the care you need, when you need it.  We recommend signing up for the patient portal called "MyChart".  Sign up information is provided on this After Visit Summary.  MyChart is used to connect with patients for Virtual Visits (Telemedicine).  Patients are able to view lab/test results, encounter notes, upcoming appointments, etc.  Non-urgent messages can be sent to your provider as well.   To learn more about what you can do with MyChart, go to [email or web address].    Your next appointment:   4-5 month(s)  The format for your next appointment:   In Person  Provider:   Shelva Majestic, MD  Other Instructions   Elevate your legs to help with swelling, if leg elevation does not help with the swelling try wearing compression stocking to help with the swelling.  Hilbert Corrigan, [place]  11/12/2019 11:05 PM    [facility]

## 2019-11-11 LAB — SARS CORONAVIRUS 2 (TAT 6-24 HRS): SARS Coronavirus 2: NEGATIVE

## 2019-11-12 ENCOUNTER — Encounter: Payer: Self-pay | Admitting: Physician Assistant

## 2019-11-13 ENCOUNTER — Other Ambulatory Visit: Payer: Self-pay

## 2019-11-13 ENCOUNTER — Encounter (HOSPITAL_COMMUNITY)
Admission: RE | Disposition: A | Discharge status: Home / Self Care | Payer: Self-pay | Source: Home / Self Care | Attending: Urology

## 2019-11-13 ENCOUNTER — Ambulatory Visit (HOSPITAL_COMMUNITY): Payer: Medicare PPO | Admitting: Anesthesiology

## 2019-11-13 ENCOUNTER — Ambulatory Visit (HOSPITAL_COMMUNITY)
Admission: RE | Admit: 2019-11-13 | Discharge: 2019-11-13 | Disposition: A | Discharge status: Home / Self Care | Payer: Medicare PPO | Attending: Urology | Admitting: Urology

## 2019-11-13 ENCOUNTER — Encounter (HOSPITAL_COMMUNITY): Payer: Self-pay | Admitting: Urology

## 2019-11-13 DIAGNOSIS — Z87891 Personal history of nicotine dependence: Secondary | ICD-10-CM | POA: Diagnosis not present

## 2019-11-13 DIAGNOSIS — I10 Essential (primary) hypertension: Secondary | ICD-10-CM | POA: Insufficient documentation

## 2019-11-13 DIAGNOSIS — Z7901 Long term (current) use of anticoagulants: Secondary | ICD-10-CM | POA: Insufficient documentation

## 2019-11-13 DIAGNOSIS — K4090 Unilateral inguinal hernia, without obstruction or gangrene, not specified as recurrent: Secondary | ICD-10-CM | POA: Diagnosis not present

## 2019-11-13 DIAGNOSIS — N5089 Other specified disorders of the male genital organs: Secondary | ICD-10-CM | POA: Diagnosis present

## 2019-11-13 DIAGNOSIS — Z79899 Other long term (current) drug therapy: Secondary | ICD-10-CM | POA: Insufficient documentation

## 2019-11-13 DIAGNOSIS — D408 Neoplasm of uncertain behavior of other specified male genital organs: Secondary | ICD-10-CM | POA: Diagnosis not present

## 2019-11-13 DIAGNOSIS — I4891 Unspecified atrial fibrillation: Secondary | ICD-10-CM | POA: Insufficient documentation

## 2019-11-13 HISTORY — PX: VASECTOMY: SHX75

## 2019-11-13 HISTORY — PX: SPERMATOCELECTOMY: SHX2420

## 2019-11-13 SURGERY — EXCISION, SPERMATOCELE
Anesthesia: General | Laterality: Right

## 2019-11-13 MED ORDER — FENTANYL CITRATE (PF) 100 MCG/2ML IJ SOLN: 25.0000 ug | INTRAMUSCULAR | Status: DC | PRN

## 2019-11-13 MED ORDER — BUPIVACAINE HCL (PF) 0.25 % IJ SOLN
INTRAMUSCULAR | Status: DC | PRN
  Administered 2019-11-13: 10 mL

## 2019-11-13 MED ORDER — MIDAZOLAM HCL 2 MG/2ML IJ SOLN
INTRAMUSCULAR | Status: AC
  Filled 2019-11-13: qty 2

## 2019-11-13 MED ORDER — DEXAMETHASONE SODIUM PHOSPHATE 4 MG/ML IJ SOLN
INTRAMUSCULAR | Status: DC | PRN
  Administered 2019-11-13: 8 mg via INTRAVENOUS

## 2019-11-13 MED ORDER — ONDANSETRON HCL 4 MG/2ML IJ SOLN
INTRAMUSCULAR | Status: AC
  Filled 2019-11-13: qty 4

## 2019-11-13 MED ORDER — FENTANYL CITRATE (PF) 100 MCG/2ML IJ SOLN
INTRAMUSCULAR | Status: DC | PRN
  Administered 2019-11-13 (×2): 25 ug via INTRAVENOUS
  Administered 2019-11-13: 50 ug via INTRAVENOUS
  Administered 2019-11-13: 25 ug via INTRAVENOUS

## 2019-11-13 MED ORDER — LACTATED RINGERS IV SOLN: INTRAVENOUS | Status: DC

## 2019-11-13 MED ORDER — EPHEDRINE SULFATE 50 MG/ML IJ SOLN
INTRAMUSCULAR | Status: DC | PRN
  Administered 2019-11-13: 5 mg via INTRAVENOUS
  Administered 2019-11-13: 10 mg via INTRAVENOUS

## 2019-11-13 MED ORDER — ONDANSETRON HCL 4 MG/2ML IJ SOLN
INTRAMUSCULAR | Status: DC | PRN
  Administered 2019-11-13: 4 mg via INTRAVENOUS

## 2019-11-13 MED ORDER — OXYCODONE-ACETAMINOPHEN 5-325 MG PO TABS: 1.0000 | ORAL_TABLET | ORAL | 0 refills | Status: DC | PRN

## 2019-11-13 MED ORDER — BUPIVACAINE HCL (PF) 0.25 % IJ SOLN
INTRAMUSCULAR | Status: AC
  Filled 2019-11-13: qty 30

## 2019-11-13 MED ORDER — FENTANYL CITRATE (PF) 100 MCG/2ML IJ SOLN
INTRAMUSCULAR | Status: AC
  Filled 2019-11-13: qty 2

## 2019-11-13 MED ORDER — GLYCOPYRROLATE 0.2 MG/ML IJ SOLN
INTRAMUSCULAR | Status: DC | PRN
Start: 2019-11-13 — End: 2019-11-13
  Administered 2019-11-13 (×2): .1 mg via INTRAVENOUS

## 2019-11-13 MED ORDER — LIDOCAINE HCL (CARDIAC) PF 100 MG/5ML IV SOSY
PREFILLED_SYRINGE | INTRAVENOUS | Status: DC | PRN
  Administered 2019-11-13: 80 mg via INTRAVENOUS

## 2019-11-13 MED ORDER — SODIUM CHLORIDE 0.9 % IR SOLN
Status: DC | PRN
  Administered 2019-11-13: 1000 mL

## 2019-11-13 MED ORDER — PHENYLEPHRINE HCL (PRESSORS) 10 MG/ML IV SOLN
INTRAVENOUS | Status: DC | PRN
  Administered 2019-11-13 (×2): 120 ug via INTRAVENOUS
  Administered 2019-11-13 (×2): 80 ug via INTRAVENOUS

## 2019-11-13 MED ORDER — CEFAZOLIN SODIUM-DEXTROSE 2-4 GM/100ML-% IV SOLN
2.0000 g | INTRAVENOUS | Status: AC
  Administered 2019-11-13: 2 g via INTRAVENOUS

## 2019-11-13 MED ORDER — PHENYLEPHRINE 40 MCG/ML (10ML) SYRINGE FOR IV PUSH (FOR BLOOD PRESSURE SUPPORT)
PREFILLED_SYRINGE | INTRAVENOUS | Status: AC
  Filled 2019-11-13: qty 30

## 2019-11-13 MED ORDER — PROPOFOL 10 MG/ML IV BOLUS
INTRAVENOUS | Status: DC | PRN
  Administered 2019-11-13: 160 mg via INTRAVENOUS

## 2019-11-13 MED ORDER — CEFAZOLIN SODIUM-DEXTROSE 2-4 GM/100ML-% IV SOLN
INTRAVENOUS | Status: AC
  Filled 2019-11-13: qty 100

## 2019-11-13 MED ORDER — DEXAMETHASONE SODIUM PHOSPHATE 10 MG/ML IJ SOLN
INTRAMUSCULAR | Status: AC
  Filled 2019-11-13: qty 2

## 2019-11-13 SURGICAL SUPPLY — 41 items
CLOTH BEACON ORANGE TIMEOUT ST (SAFETY) ×4 IMPLANT
COVER LIGHT HANDLE STERIS (MISCELLANEOUS) ×8 IMPLANT
COVER WAND RF STERILE (DRAPES) ×4 IMPLANT
DERMABOND ADVANCED (GAUZE/BANDAGES/DRESSINGS) ×2
DERMABOND ADVANCED .7 DNX12 (GAUZE/BANDAGES/DRESSINGS) IMPLANT
ELECT NDL BLADE 2-5/6 (NEEDLE) ×2 IMPLANT
ELECT NEEDLE BLADE 2-5/6 (NEEDLE) ×4 IMPLANT
ELECT REM PT RETURN 9FT ADLT (ELECTROSURGICAL) ×4
ELECTRODE REM PT RTRN 9FT ADLT (ELECTROSURGICAL) ×2 IMPLANT
GLOVE BIO SURGEON STRL SZ 6.5 (GLOVE) ×1 IMPLANT
GLOVE BIO SURGEON STRL SZ8 (GLOVE) ×4 IMPLANT
GLOVE BIO SURGEONS STRL SZ 6.5 (GLOVE) ×1
GLOVE BIOGEL PI IND STRL 6.5 (GLOVE) IMPLANT
GLOVE BIOGEL PI IND STRL 7.0 (GLOVE) ×4 IMPLANT
GLOVE BIOGEL PI IND STRL 8 (GLOVE) ×2 IMPLANT
GLOVE BIOGEL PI INDICATOR 6.5 (GLOVE) ×2
GLOVE BIOGEL PI INDICATOR 7.0 (GLOVE) ×4
GLOVE BIOGEL PI INDICATOR 8 (GLOVE) ×2
GLOVE ECLIPSE 6.5 STRL STRAW (GLOVE) ×2 IMPLANT
GOWN STRL REUS W/ TWL LRG LVL3 (GOWN DISPOSABLE) ×2 IMPLANT
GOWN STRL REUS W/ TWL XL LVL3 (GOWN DISPOSABLE) ×2 IMPLANT
GOWN STRL REUS W/TWL LRG LVL3 (GOWN DISPOSABLE) ×8 IMPLANT
GOWN STRL REUS W/TWL XL LVL3 (GOWN DISPOSABLE) ×4
INST SET MINOR GENERAL (KITS) ×4 IMPLANT
KIT TURNOVER KIT A (KITS) ×4 IMPLANT
MANIFOLD NEPTUNE II (INSTRUMENTS) ×2 IMPLANT
NDL HYPO 25X1 1.5 SAFETY (NEEDLE) ×2 IMPLANT
NEEDLE HYPO 25X1 1.5 SAFETY (NEEDLE) ×4 IMPLANT
NS IRRIG 1000ML POUR BTL (IV SOLUTION) ×2 IMPLANT
PACK MINOR (CUSTOM PROCEDURE TRAY) ×4 IMPLANT
PAD ARMBOARD 7.5X6 YLW CONV (MISCELLANEOUS) ×4 IMPLANT
SET BASIN LINEN APH (SET/KITS/TRAYS/PACK) ×4 IMPLANT
SUT MNCRL AB 4-0 PS2 18 (SUTURE) ×4 IMPLANT
SUT NOVA NAB GS-22 2 2-0 T-19 (SUTURE) ×2 IMPLANT
SUT SILK 2 0 (SUTURE) ×4
SUT SILK 2-0 18XBRD TIE 12 (SUTURE) IMPLANT
SUT VIC AB 2-0 CT1 27 (SUTURE) ×4
SUT VIC AB 2-0 CT1 TAPERPNT 27 (SUTURE) IMPLANT
SUT VIC AB 3-0 SH 27 (SUTURE) ×4
SUT VIC AB 3-0 SH 27X BRD (SUTURE) IMPLANT
SYR CONTROL 10ML LL (SYRINGE) ×4 IMPLANT

## 2019-11-13 NOTE — Interval H&P Note (Signed)
History and Physical Interval Note:  11/13/2019 9:25 AM  Joseph [name]  has presented today for surgery, with the diagnosis of right spermatic cord cyst.  The various methods of treatment have been discussed with the patient and family. After consideration of risks, benefits and other options for treatment, the patient has consented to  Procedure(s) with comments: EXCISION OF SPERMATIC CORD CYST (Right) - excision of spermatic cord cyst as a surgical intervention.  The patient's history has been reviewed, patient examined, no change in status, stable for surgery.  I have reviewed the patient's chart and labs.  Questions were answered to the patient's satisfaction.     Nicolette Bang

## 2019-11-13 NOTE — Anesthesia Procedure Notes (Signed)
Procedure Name: LMA Insertion Date/Time: 11/13/2019 10:12 AM Performed by: Georgeanne Nim, CRNA Pre-anesthesia Checklist: Emergency Drugs available, Patient identified, Suction available, Patient being monitored and Timeout performed Patient Re-evaluated:Patient Re-evaluated prior to induction Oxygen Delivery Method: Circle system utilized Preoxygenation: Pre-oxygenation with 100% oxygen Induction Type: IV induction LMA: LMA inserted LMA Size: 5.0 Number of attempts: 1 Placement Confirmation: positive ETCO2,  CO2 detector and breath sounds checked- equal and bilateral Tube secured with: Tape Dental Injury: Teeth and Oropharynx as per pre-operative assessment

## 2019-11-13 NOTE — Transfer of Care (Signed)
Immediate Anesthesia Transfer of Care Note  Patient: Joseph [name]  Procedure(s) Performed: EXCISION OF SPERMATIC CORD CYST (Right )  Patient Location: PACU  Anesthesia Type:General  Level of Consciousness: awake, alert  and patient cooperative  Airway & Oxygen Therapy: Patient Spontanous Breathing  Post-op Assessment: Report given to RN and Post -op Vital signs reviewed and stable  Post vital signs: Reviewed and stable  Last Vitals:  Vitals Value Taken Time  BP 113/57 11/13/19 1122  Temp    Pulse 64 11/13/19 1125  Resp 18 11/13/19 1125  SpO2 90 % 11/13/19 1125  Vitals shown include unvalidated device data.  Last Pain:  Vitals:   11/13/19 0918  TempSrc: Oral  PainSc: 0-No pain      Patients Stated Pain Goal: 6 ([id number])  Complications: No apparent anesthesia complications

## 2019-11-13 NOTE — Discharge Instructions (Signed)
Open Hernia Repair, Adult, Care After These instructions give you information about caring for yourself after your procedure. Your doctor may also give you more specific instructions. If you have problems or questions, contact your doctor. Follow these instructions at home: Surgical cut (incision) care   Follow instructions from your doctor about how to take care of your surgical cut area. Make sure you: ? Wash your hands with soap and water before you change your bandage (dressing). If you cannot use soap and water, use hand sanitizer. ? Change your bandage as told by your doctor. ? Leave stitches (sutures), skin glue, or skin tape (adhesive) strips in place. They may need to stay in place for 2 weeks or longer. If tape strips get loose and curl up, you may trim the loose edges. Do not remove tape strips completely unless your doctor says it is okay.  Check your surgical cut every day for signs of infection. Check for: ? More redness, swelling, or pain. ? More fluid or blood. ? Warmth. ? Pus or a bad smell. Activity  Do not drive or use heavy machinery while taking prescription pain medicine. Do not drive until your doctor says it is okay.  Until your doctor says it is okay: ? Do not lift anything that is heavier than 10 lb (4.5 kg). ? Do not play contact sports.  Return to your normal activities as told by your doctor. Ask your doctor what activities are safe. General instructions  To prevent or treat having a hard time pooping (constipation) while you are taking prescription pain medicine, your doctor may recommend that you: ? Drink enough fluid to keep your pee (urine) clear or pale yellow. ? Take over-the-counter or prescription medicines. ? Eat foods that are high in fiber, such as fresh fruits and vegetables, whole grains, and beans. ? Limit foods that are high in fat and processed sugars, such as fried and sweet foods.  Take over-the-counter and prescription medicines only as  told by your doctor.  Do not take baths, swim, or use a hot tub until your doctor says it is okay.  Keep all follow-up visits as told by your doctor. This is important. Contact a doctor if:  You develop a rash.  You have more redness, swelling, or pain around your surgical cut.  You have more fluid or blood coming from your surgical cut.  Your surgical cut feels warm to the touch.  You have pus or a bad smell coming from your surgical cut.  You have a fever or chills.  You have blood in your poop (stool).  You have not pooped in 2-3 days.  Medicine does not help your pain. Get help right away if:  You have chest pain or you are short of breath.  You feel light-headed.  You feel weak and dizzy (feel faint).  You have very bad pain.  You throw up (vomit) and your pain is worse. This information is not intended to replace advice given to you by your health care provider. Make sure you discuss any questions you have with your health care provider. Document Revised: 10/21/2018 Document Reviewed: 12/11/2015 Elsevier Patient Education  2020 [name] Anesthesia, Adult, Care After This sheet gives you information about how to care for yourself after your procedure. Your health care provider may also give you more specific instructions. If you have problems or questions, contact your health care provider. What can I expect after the procedure? After the procedure, the following  side effects are common:  Pain or discomfort at the IV site.  Nausea.  Vomiting.  Sore throat.  Trouble concentrating.  Feeling cold or chills.  Weak or tired.  Sleepiness and fatigue.  Soreness and body aches. These side effects can affect parts of the body that were not involved in surgery. Follow these instructions at home:  For at least 24 hours after the procedure:  Have a responsible adult stay with you. It is important to have someone help care for you until you are  awake and alert.  Rest as needed.  Do not: ? Participate in activities in which you could fall or become injured. ? Drive. ? Use heavy machinery. ? Drink alcohol. ? Take sleeping pills or medicines that cause drowsiness. ? Make important decisions or sign legal documents. ? Take care of children on your own. Eating and drinking  Follow any instructions from your health care provider about eating or drinking restrictions.  When you feel hungry, start by eating small amounts of foods that are soft and easy to digest (bland), such as toast. Gradually return to your regular diet.  Drink enough fluid to keep your urine pale yellow.  If you vomit, rehydrate by drinking water, juice, or clear broth. General instructions  If you have sleep apnea, surgery and certain medicines can increase your risk for breathing problems. Follow instructions from your health care provider about wearing your sleep device: ? Anytime you are sleeping, including during daytime naps. ? While taking prescription pain medicines, sleeping medicines, or medicines that make you drowsy.  Return to your normal activities as told by your health care provider. Ask your health care provider what activities are safe for you.  Take over-the-counter and prescription medicines only as told by your health care provider.  If you smoke, do not smoke without supervision.  Keep all follow-up visits as told by your health care provider. This is important. Contact a health care provider if:  You have nausea or vomiting that does not get better with medicine.  You cannot eat or drink without vomiting.  You have pain that does not get better with medicine.  You are unable to pass urine.  You develop a skin rash.  You have a fever.  You have redness around your IV site that gets worse. Get help right away if:  You have difficulty breathing.  You have chest pain.  You have blood in your urine or stool, or you vomit  blood. Summary  After the procedure, it is common to have a sore throat or nausea. It is also common to feel tired.  Have a responsible adult stay with you for the first 24 hours after general anesthesia. It is important to have someone help care for you until you are awake and alert.  When you feel hungry, start by eating small amounts of foods that are soft and easy to digest (bland), such as toast. Gradually return to your regular diet.  Drink enough fluid to keep your urine pale yellow.  Return to your normal activities as told by your health care provider. Ask your health care provider what activities are safe for you. This information is not intended to replace advice given to you by your health care provider. Make sure you discuss any questions you have with your health care provider. Document Revised: 07/02/2017 Document Reviewed: 02/12/2017 Elsevier Patient Education  [place].

## 2019-11-13 NOTE — Op Note (Signed)
Preoperative diagnosis: right spermatic cord mass  Postoperative diagnosis: Same  Procedure: Right spermatic cord mass excision  Attending: Nicolette Bang, MD  Anesthesia: General  History of blood loss: Minimal  Antibiotics: ancef  Drains: none  Specimens: spermatic cord mass and right cross section of vas deferenes  Findings: 3cm firm mass on spermatic cord not involving vas deferens. Small hernia sac ligated by Dr. Aviva Signs Testicular artery identified and preserved using doppler.  Indications: Patient is a 74 year old male with a history of right testicular pain who was found on exam and confirmed with [place] a 3cm cystic spermatic cord mass.  After discussing treatment options he decided to proceed with spermatic cord mass excision.   Procedure in detail: Prior to procedure consent was obtained.  Patient was brought to the operating room and a brief timeout was done to ensure correct patient, correct procedure, correct site.  General anesthesia was administered and patient was placed in supine position.  His genitalia and abdomen was then prepped and draped in usual sterile fashion.  A 4 cm incision was made in the subinguinal region.  We dissected down through the subcutaneous tissue to until we reached the spermatic cord.  The spermatic cord was identified and a Penrose drain was placed under the cord.  We then opened the cord with sharp dissection. We proceeded to identify the testicular artery with the doppler. Using sharp dissection the mass was excised from the spermatic cord. At that time we encountered a hernia sac and Dr. Arnoldo Morale was consulted intraoperatively. We ligated the vas deferens with 0 silk tie. Once all the veins were ligated we once again check for flow in the testicular artery and we noted good flow. We then inspected the operative field and no residual bleeding was noted. We then closed the subcutaneous tissues in 2 layers with 2-0 Vicryl in a running fashion.  We  then closed the skin with 4-0 Monocryl in a running fashion.  Skin glue was then placed over the incision. We then placed a scrotal fluff and this then concluded the procedure which was well tolerated by the patient.  Complications: None  Condition: Stable, extubated, transferred to PACU.  Plan: Patient is to be discharged home.  He is to follow up in 2 weeks for wound check.

## 2019-11-13 NOTE — Consult Note (Signed)
I was asked by Dr. Alyson Ingles of urology for intraoperative consultation.  Upon dissection of the spermatic cord, it was noted that he had residual small hernia sac that had already been divided.  This measured approximately 1 to 1/2 cm above the peritoneal reflection.  It was freed away from the peritoneal reflection.  It was twisted in a 2-0 Novafil suture was placed to ligated.  When this was done, and medially retracted into the preperitoneal space.  No significant indirect hernia was found that needed mesh repair.  Dr. Alyson Ingles will complete his portion of the operative procedure.

## 2019-11-13 NOTE — Anesthesia Postprocedure Evaluation (Signed)
Anesthesia Post Note  Patient: Joseph [name]  Procedure(s) Performed: EXCISION OF SPERMATIC CORD CYST (Right ) VASECTOMY  Patient location during evaluation: PACU Anesthesia Type: General Level of consciousness: awake and alert Pain management: pain level controlled Vital Signs Assessment: post-procedure vital signs reviewed and stable Respiratory status: spontaneous breathing Cardiovascular status: stable Postop Assessment: no apparent nausea or vomiting Anesthetic complications: no     Last Vitals:  Vitals:   11/13/19 0918 11/13/19 0923  BP:  125/61  Pulse: (!) 53   Resp: 19   Temp: 36.8 C   SpO2: 98%     Last Pain:  Vitals:   11/13/19 0918  TempSrc: Oral  PainSc: 0-No pain                 Everette Rank

## 2019-11-14 LAB — SURGICAL PATHOLOGY

## 2019-11-27 ENCOUNTER — Other Ambulatory Visit: Payer: Self-pay

## 2019-11-27 ENCOUNTER — Encounter: Payer: Self-pay | Admitting: Urology

## 2019-11-27 ENCOUNTER — Ambulatory Visit (INDEPENDENT_AMBULATORY_CARE_PROVIDER_SITE_OTHER): Payer: Medicare PPO | Admitting: Urology

## 2019-11-27 VITALS — BP 167/65 | HR 71 | Temp 98.1°F

## 2019-11-27 DIAGNOSIS — N5089 Other specified disorders of the male genital organs: Secondary | ICD-10-CM

## 2019-11-27 MED ORDER — ALFUZOSIN HCL ER 10 MG PO TB24
10.0000 mg | ORAL_TABLET | Freq: Every day | ORAL | 11 refills | Status: DC
Start: 2019-11-27 — End: 2020-01-10

## 2019-11-27 NOTE — Progress Notes (Signed)
Urological Symptom Review  Patient is experiencing the following symptoms: Frequent urination Hard to postpone urination Leakage of urine   Review of Systems  Gastrointestinal (upper)  : Negative for upper GI symptoms  Gastrointestinal (lower) : Negative for lower GI symptoms  Constitutional : Negative for symptoms  Skin: Negative for skin symptoms  Eyes: Negative for eye symptoms  Ear/Nose/Throat : Negative for Ear/Nose/Throat symptoms  Hematologic/Lymphatic: Negative for Hematologic/Lymphatic symptoms  Cardiovascular : Negative for cardiovascular symptoms  Respiratory : Negative for respiratory symptoms  Endocrine: Negative for endocrine symptoms  Musculoskeletal: Negative for musculoskeletal symptoms  Neurological: Negative for neurological symptoms  Psychologic: Negative for psychiatric symptoms  

## 2019-11-27 NOTE — Progress Notes (Signed)
11/27/2019 3:08 PM   Joseph [name] 1946/01/19 [id number]  Referring provider: Lemmie Evens, MD [place],   [id number]  Right spermatic cord mass  HPI: [name] is a 74yo with a right spermatic cord mass s/p right mass excision and right hernia repair. Mild right inguinal pain. Mild swelling per patient. No fevers/chills/sweats.    PMH: Past Medical History:  Diagnosis Date  . 1st degree AV block   . Atrial fibrillation ([place])   . Diastolic dysfunction 123456   grade 1  . Hypertension   . New onset atrial fibrillation ([place]) 06/2015  . RBBB   . Sinus bradycardia   . Stroke [facility])    no defecits, pt denies having a stroke, says [name] diagnosed this    Surgical History: Past Surgical History:  Procedure Laterality Date  . COLONOSCOPY    . COLONOSCOPY  12/30/2011   Procedure: COLONOSCOPY;  Surgeon: [name], MD;  [facility];  Service: Endoscopy;  Laterality: N/A;  1:45PM  . COLONOSCOPY N/A 04/19/2019   Procedure: COLONOSCOPY;  Surgeon: [name], MD;  [facility];  Service: Endoscopy;  Laterality: N/A;  2:00pm  . EXCISION MASS LOWER EXTREMETIES Left 05/12/2018   Procedure: Left foot plantar fibroma excision;  Surgeon: [name], MD;  Location: [place];  Service: Orthopedics;  Laterality: Left;  . FOOT SURGERY Left   . SPERMATOCELECTOMY Right 11/13/2019   Procedure: EXCISION OF SPERMATIC CORD CYST;  Surgeon: [name], MD;  [facility];  Service: Urology;  Laterality: Right;  Marland Kitchen VASECTOMY  11/13/2019   Procedure: VASECTOMY;  Surgeon: [name], MD;  [facility]: [facility];  Service: Urology;;    Home Medications:  Allergies as of 11/27/2019   No Known Allergies     Medication List       Accurate as of Nov 27, 2019  3:08 PM. If you have any questions, ask your nurse or doctor.        amLODipine 10 MG tablet Commonly known as: NORVASC Take 1 tablet (10 mg total) by  mouth daily. What changed: when to take this   apixaban 5 MG Tabs tablet Commonly known as: Eliquis Take 1 tablet (5 mg total) by mouth 2 (two) times daily.   cholecalciferol 25 MCG (1000 UNIT) tablet Commonly known as: VITAMIN D Take 1,000 Units by mouth daily.   CoQ10 200 MG Caps Take 200 mg by mouth daily.   doxazosin 8 MG tablet Commonly known as: CARDURA Take 8 mg by mouth at bedtime.   irbesartan 300 MG tablet Commonly known as: AVAPRO TAKE 1 TABLET BY MOUTH DAILY   Lubricant Eye Drops 0.4-0.3 % Soln Generic drug: Polyethyl Glycol-Propyl Glycol Place 1 drop into both eyes 3 (three) times daily as needed (dry/irritated eyes.).   minoxidil 2.5 MG tablet Commonly known as: LONITEN Take 5 mg by mouth daily.   multivitamin with minerals Tabs tablet Take 1 tablet by mouth daily.   oxyCODONE-acetaminophen 5-325 MG tablet Commonly known as: Percocet Take 1 tablet by mouth every 4 (four) hours as needed for severe pain.   PROSTATE HEALTH PO Take 1 capsule by mouth daily. Super Beta Prostate   saw palmetto 500 MG capsule Take 500 mg by mouth every evening.   spironolactone 25 MG tablet Commonly known as: ALDACTONE Take 0.5 tablets (12.5 mg total) by mouth daily. What changed:   when to take this  reasons to take this  Turmeric 500 MG Caps Take 500 mg by mouth daily.   vitamin B-12 500 MCG tablet Commonly known as: CYANOCOBALAMIN Take 500 mcg by mouth daily.       Allergies: No Known Allergies  Family History: Family History  Problem Relation Age of Onset  . Cancer Brother   . CVA Mother   . Hypertension Father   . Colon cancer Neg Hx     Social History:  reports that he quit smoking about 21 years ago. He smoked 0.00 packs per day for 4.00 years. He has never used smokeless tobacco. He reports current alcohol use. He reports that he does not use drugs.  ROS: All other review of systems were reviewed and are negative except what is noted above  in HPI  Physical Exam: BP (!) 167/65   Pulse 71   Temp 98.1 F (36.7 C)   Constitutional:  Alert and oriented, No acute distress. HEENT: [place] AT, moist mucus membranes.  Trachea midline, no masses. Cardiovascular: No clubbing, cyanosis, or edema. Respiratory: Normal respiratory effort, no increased work of breathing. GI: Abdomen is soft, nontender, nondistended, no abdominal masses GU: No CVA tenderness. Circumcised phallus. No masses/lesions on penis, testis, scrotum. Healing right inguinal incision. No induration, no erythema.  Lymph: No cervical or inguinal lymphadenopathy. Skin: No rashes, bruises or suspicious lesions. Neurologic: Grossly intact, no focal deficits, moving all 4 extremities. Psychiatric: Normal mood and affect.  Laboratory Data: Lab Results  Component Value Date   WBC 5.4 11/06/2019   HGB 11.8 (L) 11/06/2019   HCT 38.7 (L) 11/06/2019   MCV 71.3 (L) 11/06/2019   PLT 231 11/06/2019    Lab Results  Component Value Date   CREATININE 1.10 11/06/2019    No results found for: PSA  No results found for: TESTOSTERONE  No results found for: HGBA1C  Urinalysis    Component Value Date/Time   COLORURINE YELLOW 11/06/2019 2130   APPEARANCEUR CLEAR 11/06/2019 2130   LABSPEC 1.016 11/06/2019 2130   PHURINE 5.0 11/06/2019 2130   GLUCOSEU NEGATIVE 11/06/2019 2130   HGBUR NEGATIVE 11/06/2019 2130   [place] NEGATIVE 11/06/2019 2130   [place] NEGATIVE 11/06/2019 2130   PROTEINUR NEGATIVE 11/06/2019 2130   NITRITE NEGATIVE 11/06/2019 2130   LEUKOCYTESUR NEGATIVE 11/06/2019 2130    No results found for: LABMICR, [place], RBCUA, LABEPIT, MUCUS, BACTERIA  Pertinent Imaging:  No results found for this or any previous visit. No results found for this or any previous visit. No results found for this or any previous visit. No results found for this or any previous visit. No results found for this or any previous visit. No results found for this or any  previous visit. No results found for this or any previous visit. No results found for this or any previous visit.  Assessment & Plan:    1. Testicular mass Cord mass was benign. RTC prn  2. Nocturia: -uroxatral 10mg  qhs.    No follow-ups on file.  Nicolette Bang, MD  [facility]

## 2019-11-28 ENCOUNTER — Other Ambulatory Visit: Payer: Self-pay | Admitting: Cardiovascular Disease

## 2020-01-05 ENCOUNTER — Telehealth: Payer: Self-pay

## 2020-01-05 NOTE — Telephone Encounter (Signed)
Reviewed results with pt, pt has many questions and would like to discuss in an office visit with Dr.kelly. able to schedule pt for 01/09/20 at 12pm. Pt will also like a printed copy of his results and will come by the office on Monday to pick them up. No other questions at this time.

## 2020-01-09 ENCOUNTER — Ambulatory Visit: Payer: Medicare PPO | Admitting: Cardiovascular Disease

## 2020-01-09 ENCOUNTER — Encounter: Payer: Self-pay | Admitting: Cardiovascular Disease

## 2020-01-09 NOTE — Progress Notes (Signed)
Cardiology Office Note:    Date:  01/10/2020   ID:  Joseph [name], DOB 1946/05/12, MRN [id number]  PCP:  Lemmie Evens, MD  Cardiologist:  Shelva Majestic, MD   Referring MD: Lemmie Evens, MD   Chief Complaint  Patient presents with  . Follow-up  . Numbness    Left 1st, 2nd, 3rd fingers    History of Present Illness:    Joseph [name] is a 74 y.o. male with a hx of HTN, PAF, RBBB, first degree heart block, grade II DD, bradycardia, rheumatic heart disease, and hx of CVA (2013). Small lacunar infarct in 2013 thought to be related to hypertension. Afib first diagnosed in 06/2015. IV cardizem resulted in bradycardia. Anticoagulated with eliquis. Echo at that time with preserved EF of 50-55%, grade 2 DD, no significant valvular disease. Myoview 06/2015 with fixed defect in the inferior wall. 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  Echo 08/2018 with normal EF, mild LVH, mild LAE.   Pt was seen by Dr. Claiborne Billings 09/2019 with an EKG showing junctional rhythm with intermittent p waves. Zio patch was placed which showed minimum heart rate 36, max heart rate 190, one episode of NSVT 5-beat run, and multiple episodes of SVT. Pt was seen in ED 11/06/19 with dizziness after eating a browning with CBD/THC. Head CT was normal.  He was last seen in clinic on 11/10/19 by [name] PAC. He had been cleared for spermatic cord surgery and instructed to hold eliquis for 36 hrs. No further dizziness at that visit. He underwent spermatic cord mass removal and right inguinal hernia repair 11/13/19.    He presents today for follow up. He called the office wanting to review his heart monitor results. He states accelerated heart rate is due to his dreams and sometimes wakes him up at night. He denies recent episodes of dizziness, pre-syncope, or syncope. Pressure well-controlled today. No chest pain, dyspnea, orthopnea, or lower extremity edema. We discussed low  salt and monitor fluid status.   Past Medical History:  Diagnosis Date  . 1st degree AV block   . Atrial fibrillation ([place])   . Diastolic dysfunction 8/33   grade 1  . Hypertension   . New onset atrial fibrillation ([place]) 06/2015  . RBBB   . Sinus bradycardia   . Stroke [facility])    no defecits, pt denies having a stroke, says [name] diagnosed this    Past Surgical History:  Procedure Laterality Date  . COLONOSCOPY    . COLONOSCOPY  12/30/2011   Procedure: COLONOSCOPY;  Surgeon: [name], MD;  [facility];  Service: Endoscopy;  Laterality: N/A;  1:45PM  . COLONOSCOPY N/A 04/19/2019   Procedure: COLONOSCOPY;  Surgeon: [name], MD;  [facility];  Service: Endoscopy;  Laterality: N/A;  2:00pm  . EXCISION MASS LOWER EXTREMETIES Left 05/12/2018   Procedure: Left foot plantar fibroma excision;  Surgeon: [name], MD;  Location: [place];  Service: Orthopedics;  Laterality: Left;  . FOOT SURGERY Left   . SPERMATOCELECTOMY Right 11/13/2019   Procedure: EXCISION OF SPERMATIC CORD CYST;  Surgeon: [name], MD;  [facility];  Service: Urology;  Laterality: Right;  Marland Kitchen VASECTOMY  11/13/2019   Procedure: VASECTOMY;  Surgeon: [name], MD;  [facility];  Service: Urology;;    Current Medications: Current Meds  Medication Sig  . alfuzosin (UROXATRAL)  10 MG 24 hr tablet Take 1 tablet (10 mg total) by mouth daily with breakfast.  . amLODipine (NORVASC) 10 MG tablet TAKE 1 TABLET(10 MG) BY MOUTH DAILY  . apixaban (ELIQUIS) 5 MG TABS tablet Take 1 tablet (5 mg total) by mouth 2 (two) times daily.  . cholecalciferol (VITAMIN D) 25 MCG (1000 UT) tablet Take 1,000 Units by mouth daily.  . Coenzyme Q10 (COQ10) 200 MG CAPS Take 200 mg by mouth daily.  Marland Kitchen doxazosin (CARDURA) 8 MG tablet Take 8 mg by mouth at bedtime.   . irbesartan (AVAPRO) 300 MG tablet TAKE 1 TABLET BY MOUTH DAILY (Patient taking differently: Take 300  mg by mouth daily. )  . minoxidil (LONITEN) 2.5 MG tablet Take 5 mg by mouth daily.   . Misc Natural Products (PROSTATE HEALTH PO) Take 1 capsule by mouth daily. Super Beta Prostate  . Multiple Vitamin (MULTIVITAMIN WITH MINERALS) TABS tablet Take 1 tablet by mouth daily.  Vladimir Faster Glycol-Propyl Glycol (LUBRICANT EYE DROPS) 0.4-0.3 % SOLN Place 1 drop into both eyes 3 (three) times daily as needed (dry/irritated eyes.).  Marland Kitchen saw palmetto 500 MG capsule Take 500 mg by mouth every evening.  Marland Kitchen spironolactone (ALDACTONE) 25 MG tablet Take 0.5 tablets (12.5 mg total) by mouth daily. (Patient taking differently: Take 12.5 mg by mouth daily as needed (for fluid). )  . Turmeric 500 MG CAPS Take 500 mg by mouth daily.  . vitamin B-12 (CYANOCOBALAMIN) 500 MCG tablet Take 500 mcg by mouth daily.  . [DISCONTINUED] oxyCODONE-acetaminophen (PERCOCET) 5-325 MG tablet Take 1 tablet by mouth every 4 (four) hours as needed for severe pain.     Allergies:   Patient has no known allergies.   Social History   Socioeconomic History  . Marital status: Married    Spouse name: Not on file  . Number of children: 5  . Years of education: Not on file  . Highest education level: Not on file  Occupational History  . Not on file  Tobacco Use  . Smoking status: Former Smoker    Packs/day: 0.00    Years: 4.00    Pack years: 0.00    Quit date: 2000    Years since quitting: 21.5  . Smokeless tobacco: Never Used  . Tobacco comment: quit about 40 yrs ago  Vaping Use  . Vaping Use: Never used  Substance and Sexual Activity  . Alcohol use: Yes    Alcohol/week: 0.0 standard drinks    Comment: 6 pack of beer or less in a week  . Drug use: No  . Sexual activity: Not on file  Other Topics Concern  . Not on file  Social History Narrative  . Not on file   Social Determinants of Health   Financial Resource Strain:   . Difficulty of Paying Living Expenses:   Food Insecurity:   . Worried About Sales executive in the Last Year:   . Arboriculturist in the Last Year:   Transportation Needs:   . Film/video editor (Medical):   Marland Kitchen Lack of Transportation (Non-Medical):   Physical Activity:   . Days of Exercise per Week:   . Minutes of Exercise per Session:   Stress:   . Feeling of Stress :   Social Connections:   . Frequency of Communication with Friends and Family:   . Frequency of Social Gatherings with Friends and Family:   . Attends Religious Services:   . Active Member of  Clubs or Organizations:   . Attends Archivist Meetings:   Marland Kitchen Marital Status:      Family History: The patient's family history includes CVA in his mother; Cancer in his brother; Hypertension in his father. There is no history of Colon cancer.  ROS:   Please see the history of present illness.     All other systems reviewed and are negative.  EKGs/Labs/Other Studies Reviewed:    The following studies were reviewed today:  Heart monitor 09/2019: The patient was monitored from February 28 through September 24, 2019 totaling 13 days and 11 hours.  Predominant rhythm was sinus rhythm at 68 bpm average.  The slowest heart rate was sinus bradycardia at 36 bpm which occurred at 5:37 AM on March 5.  The fastest sinus rhythm was sinus tachycardia at 149 bpm which occurred at 9:18 AM on September 23, 2019.  First-degree AV block was present.  There was 1 run of 5 beats of nonsustained ventricular tachycardia with a maximum rate at 152, averaging 146 bpm.  There are occasional episodes of SVT there is interval lasting 4 beats with a maximum rate at 190, and the longest interval lasting 21.3 seconds with an average rate of 100 bpm.  There were isolated PACs, rare atrial couplets or triplets.  Ventricular ectopy was very rare.  Was an episode of ventricular bigeminy.   EKG:  EKG is  ordered today.  The ekg ordered today demonstrates sinus bradycardia with HR 59 with PAC and PVC, left axis deviation and RBBB, old septal  infarct - unchanged from prior tracing.   Recent Labs: 11/06/2019: ALT 22; B Natriuretic Peptide 48.0; BUN 19; Creatinine, Ser 1.10; Hemoglobin 11.8; Magnesium 2.0; Platelets 231; Potassium 4.0; Sodium 136; TSH 0.625  Recent Lipid Panel    Component Value Date/Time   CHOL 165 08/15/2019 1410   TRIG 56 08/15/2019 1410   HDL 73 08/15/2019 1410   CHOLHDL 2.3 08/15/2019 1410   LDLCALC 81 08/15/2019 1410    Physical Exam:    VS:  BP 120/70 (BP Location: Left Arm, Patient Position: Sitting, Cuff Size: Large)   Pulse (!) 59   Temp 98.4 F (36.9 C)   Ht 5\' 10"  (1.778 m)   Wt 216 lb (98 kg)   SpO2 97%   BMI 30.99 kg/m     Wt Readings from Last 3 Encounters:  01/10/20 216 lb (98 kg)  11/13/19 213 lb 10 oz (96.9 kg)  11/10/19 213 lb 9.6 oz (96.9 kg)     GEN: elderly male, Well nourished, well developed in no acute distress HEENT: Normal NECK: No JVD; No carotid bruits LYMPHATICS: No lymphadenopathy CARDIAC: RRR, no murmurs, rubs, gallops RESPIRATORY:  Clear to auscultation without rales, wheezing or rhonchi  ABDOMEN: Soft, non-tender, non-distended MUSCULOSKELETAL:  No edema; No deformity  SKIN: Warm and dry NEUROLOGIC:  Alert and oriented x 3 PSYCHIATRIC:  Normal affect   ASSESSMENT:    1. Paroxysmal atrial fibrillation (HCC)   2. Chronic anticoagulation   3. Bradycardia   4. Paroxysmal SVT (supraventricular tachycardia) ([place])   5. Essential hypertension    PLAN:    In order of problems listed above:  PAF Chronic anticoagulation Bradycardia  Paroxysmal SVT - no rate controlling medications - history of bradycardia - continue eliquis - no bleeding problems - average heart rate on recent monitor was 68, with nadir HR 36 - no recent dizziness or pre/syncope - no medication changes at this time - should he become symptomatic,  will refer to EP - if volume status changes, will need to repeat EKG   Hypertension - maintained on 10 mg amlodipine, 300 mg irbesartan,  12.5 mg spironolactone - pressure well-controlled today - no medication changes  Keep scheduled follow up with Dr. Claiborne Billings.   Medication Adjustments/Labs and Tests Ordered: Current medicines are reviewed at length with the patient today.  Concerns regarding medicines are outlined above.  No orders of the defined types were placed in this encounter.  No orders of the defined types were placed in this encounter.   Signed, Ledora Bottcher, [place]  01/10/2020 9:31 AM    [facility]

## 2020-01-10 ENCOUNTER — Encounter: Payer: Self-pay | Admitting: Physician Assistant

## 2020-01-10 ENCOUNTER — Encounter: Payer: Self-pay | Admitting: Urology

## 2020-01-10 ENCOUNTER — Ambulatory Visit (INDEPENDENT_AMBULATORY_CARE_PROVIDER_SITE_OTHER): Payer: Medicare PPO | Admitting: Urology

## 2020-01-10 ENCOUNTER — Other Ambulatory Visit: Payer: Self-pay

## 2020-01-10 ENCOUNTER — Ambulatory Visit: Payer: Medicare PPO | Admitting: Physician Assistant

## 2020-01-10 ENCOUNTER — Ambulatory Visit (INDEPENDENT_AMBULATORY_CARE_PROVIDER_SITE_OTHER): Payer: Medicare PPO | Admitting: Physician Assistant

## 2020-01-10 VITALS — BP 123/66 | HR 57 | Temp 98.4°F | Ht 70.0 in | Wt 212.0 lb

## 2020-01-10 VITALS — BP 120/70 | HR 59 | Temp 98.4°F | Ht 70.0 in | Wt 216.0 lb

## 2020-01-10 DIAGNOSIS — R351 Nocturia: Secondary | ICD-10-CM

## 2020-01-10 DIAGNOSIS — Z7901 Long term (current) use of anticoagulants: Secondary | ICD-10-CM | POA: Diagnosis not present

## 2020-01-10 DIAGNOSIS — R001 Bradycardia, unspecified: Secondary | ICD-10-CM | POA: Diagnosis not present

## 2020-01-10 DIAGNOSIS — N138 Other obstructive and reflux uropathy: Secondary | ICD-10-CM

## 2020-01-10 DIAGNOSIS — N401 Enlarged prostate with lower urinary tract symptoms: Secondary | ICD-10-CM

## 2020-01-10 DIAGNOSIS — I471 Supraventricular tachycardia: Secondary | ICD-10-CM | POA: Diagnosis not present

## 2020-01-10 DIAGNOSIS — I10 Essential (primary) hypertension: Secondary | ICD-10-CM

## 2020-01-10 DIAGNOSIS — I480 Paroxysmal atrial fibrillation: Secondary | ICD-10-CM

## 2020-01-10 MED ORDER — ALFUZOSIN HCL ER 10 MG PO TB24: 10.0000 mg | ORAL_TABLET | Freq: Every day | ORAL | 3 refills | Status: DC

## 2020-01-10 NOTE — Patient Instructions (Signed)
Medication Instructions:  Your physician recommends that you continue on your current medications as directed. Please refer to the Current Medication list given to you today.  *If you need a refill on your cardiac medications before your next appointment, please call your pharmacy*  Lab Work: NONE ordered at this time of appointment   If you have labs (blood work) drawn today and your tests are completely normal, you will receive your results only by: Marland Kitchen MyChart Message (if you have MyChart) OR . A paper copy in the mail If you have any lab test that is abnormal or we need to change your treatment, we will call you to review the results.  Testing/Procedures: NONE ordered at this time of appointment   Follow-Up: At [facility], you and your health needs are our priority.  As part of our continuing mission to provide you with exceptional heart care, we have created designated Provider Care Teams.  These Care Teams include your primary Cardiologist (physician) and Advanced Practice Providers (APPs -  Physician Assistants and Nurse Practitioners) who all work together to provide you with the care you need, when you need it.  We recommend signing up for the patient portal called "MyChart".  Sign up information is provided on this After Visit Summary.  MyChart is used to connect with patients for Virtual Visits (Telemedicine).  Patients are able to view lab/test results, encounter notes, upcoming appointments, etc.  Non-urgent messages can be sent to your provider as well.   To learn more about what you can do with MyChart, go to [email or web address].    Your next appointment:   Keep scheduled appointment-03/13/20 at 10:00 AM  The format for your next appointment:   In Person  Provider:   Shelva Majestic, MD  Other Instructions

## 2020-01-10 NOTE — Progress Notes (Signed)
01/10/2020 3:43 PM   Joseph [name] [birth year] [id number]  Referring provider: Lemmie Evens, MD [place],  [place] [id number]  Nocturia  HPI: [name] is a 74yo here for followup for nocturia. Nocturia down to 0-1x on uroxatral. Stream strong. NO urgency or frequency. No hematuria or dysuria. NO inguinal pain.    PMH: Past Medical History:  Diagnosis Date  . 1st degree AV block   . Atrial fibrillation ([place])   . Diastolic dysfunction 8/18   grade 1  . Hypertension   . New onset atrial fibrillation ([place]) 06/2015  . RBBB   . Sinus bradycardia   . Stroke [facility])    no defecits, pt denies having a stroke, says [name] diagnosed this    Surgical History: Past Surgical History:  Procedure Laterality Date  . COLONOSCOPY    . COLONOSCOPY  12/30/2011   Procedure: COLONOSCOPY;  Surgeon: [name], MD;  [facility];  Service: Endoscopy;  Laterality: N/A;  1:45PM  . COLONOSCOPY N/A 04/19/2019   Procedure: COLONOSCOPY;  Surgeon: [name], MD;  [facility];  Service: Endoscopy;  Laterality: N/A;  2:00pm  . EXCISION MASS LOWER EXTREMETIES Left 05/12/2018   Procedure: Left foot plantar fibroma excision;  Surgeon: [name], MD;  Location: [place];  Service: Orthopedics;  Laterality: Left;  . FOOT SURGERY Left   . SPERMATOCELECTOMY Right 11/13/2019   Procedure: EXCISION OF SPERMATIC CORD CYST;  Surgeon: [name], MD;  Location: [facility];  Service: Urology;  Laterality: Right;  Marland Kitchen VASECTOMY  11/13/2019   Procedure: VASECTOMY;  Surgeon: [name], MD;  Location: [facility];  Service: Urology;;    Home Medications:  Allergies as of 01/10/2020   No Known Allergies     Medication List       Accurate as of January 10, 2020  3:43 PM. If you have any questions, ask your nurse or doctor.        STOP taking these medications   oxyCODONE-acetaminophen 5-325 MG tablet Commonly known as: Percocet Stopped  by: Tami Lin [name], PA     TAKE these medications   alfuzosin 10 MG 24 hr tablet Commonly known as: UROXATRAL Take 1 tablet (10 mg total) by mouth daily with breakfast.   amLODipine 10 MG tablet Commonly known as: NORVASC TAKE 1 TABLET(10 MG) BY MOUTH DAILY   apixaban 5 MG Tabs tablet Commonly known as: Eliquis Take 1 tablet (5 mg total) by mouth 2 (two) times daily.   cholecalciferol 25 MCG (1000 UNIT) tablet Commonly known as: VITAMIN D Take 1,000 Units by mouth daily.   CoQ10 200 MG Caps Take 200 mg by mouth daily.   doxazosin 8 MG tablet Commonly known as: CARDURA Take 8 mg by mouth at bedtime.   irbesartan 300 MG tablet Commonly known as: AVAPRO TAKE 1 TABLET BY MOUTH DAILY   Lubricant Eye Drops 0.4-0.3 % Soln Generic drug: Polyethyl Glycol-Propyl Glycol Place 1 drop into both eyes 3 (three) times daily as needed (dry/irritated eyes.).   minoxidil 2.5 MG tablet Commonly known as: LONITEN Take 5 mg by mouth daily.   multivitamin with minerals Tabs tablet Take 1 tablet by mouth daily.   PROSTATE HEALTH PO Take 1 capsule by mouth daily. Super Beta Prostate   saw palmetto 500 MG capsule Take 500 mg by mouth every evening.   spironolactone 25 MG tablet Commonly known as: ALDACTONE Take 0.5 tablets (12.5 mg  total) by mouth daily. What changed:   when to take this  reasons to take this   Turmeric 500 MG Caps Take 500 mg by mouth daily.   vitamin B-12 500 MCG tablet Commonly known as: CYANOCOBALAMIN Take 500 mcg by mouth daily.       Allergies: No Known Allergies  Family History: Family History  Problem Relation Age of Onset  . Cancer Brother   . CVA Mother   . Hypertension Father   . Colon cancer Neg Hx     Social History:  reports that he quit smoking about 21 years ago. He smoked 0.00 packs per day for 4.00 years. He has never used smokeless tobacco. He reports current alcohol use. He reports that he does not use drugs.  ROS: All  other review of systems were reviewed and are negative except what is noted above in HPI  Physical Exam: BP 123/66   Pulse (!) 57   Temp 98.4 F (36.9 C)   Ht 5\' 10"  (1.778 m)   Wt 212 lb (96.2 kg)   BMI 30.42 kg/m   Constitutional:  Alert and oriented, No acute distress. HEENT: [place] AT, moist mucus membranes.  Trachea midline, no masses. Cardiovascular: No clubbing, cyanosis, or edema. Respiratory: Normal respiratory effort, no increased work of breathing. GI: Abdomen is soft, nontender, nondistended, no abdominal masses GU: No CVA tenderness.  Lymph: No cervical or inguinal lymphadenopathy. Skin: No rashes, bruises or suspicious lesions. Neurologic: Grossly intact, no focal deficits, moving all 4 extremities. Psychiatric: Normal mood and affect.  Laboratory Data: Lab Results  Component Value Date   WBC 5.4 11/06/2019   HGB 11.8 (L) 11/06/2019   HCT 38.7 (L) 11/06/2019   MCV 71.3 (L) 11/06/2019   PLT 231 11/06/2019    Lab Results  Component Value Date   CREATININE 1.10 11/06/2019    No results found for: PSA  No results found for: TESTOSTERONE  No results found for: HGBA1C  Urinalysis    Component Value Date/Time   COLORURINE YELLOW 11/06/2019 2130   APPEARANCEUR CLEAR 11/06/2019 2130   LABSPEC 1.016 11/06/2019 2130   PHURINE 5.0 11/06/2019 2130   GLUCOSEU NEGATIVE 11/06/2019 2130   HGBUR NEGATIVE 11/06/2019 2130   [place] NEGATIVE 11/06/2019 2130   [place] NEGATIVE 11/06/2019 2130   PROTEINUR NEGATIVE 11/06/2019 2130   NITRITE NEGATIVE 11/06/2019 2130   LEUKOCYTESUR NEGATIVE 11/06/2019 2130    No results found for: LABMICR, [place], RBCUA, LABEPIT, MUCUS, BACTERIA  Pertinent Imaging:  No results found for this or any previous visit.  No results found for this or any previous visit.  No results found for this or any previous visit.  No results found for this or any previous visit.  No results found for this or any previous visit.  No results  found for this or any previous visit.  No results found for this or any previous visit.  No results found for this or any previous visit.   Assessment & Plan:    1. Nocturia -continue uroxatral 10mg  qhs  2. Benign prostatic hyperplasia with urinary obstruction -continue uroxatral 10mg  qhs   No follow-ups on file.  Nicolette Bang, MD  [facility] [place]

## 2020-01-10 NOTE — Patient Instructions (Signed)

## 2020-01-10 NOTE — Progress Notes (Signed)

## 2020-03-10 ENCOUNTER — Other Ambulatory Visit: Payer: Self-pay | Admitting: Cardiovascular Disease

## 2020-03-13 ENCOUNTER — Other Ambulatory Visit: Payer: Self-pay

## 2020-03-13 ENCOUNTER — Ambulatory Visit (INDEPENDENT_AMBULATORY_CARE_PROVIDER_SITE_OTHER): Payer: Medicare PPO | Admitting: Cardiovascular Disease

## 2020-03-13 ENCOUNTER — Encounter: Payer: Self-pay | Admitting: Cardiovascular Disease

## 2020-03-13 DIAGNOSIS — I471 Supraventricular tachycardia: Secondary | ICD-10-CM | POA: Diagnosis not present

## 2020-03-13 DIAGNOSIS — I10 Essential (primary) hypertension: Secondary | ICD-10-CM | POA: Diagnosis not present

## 2020-03-13 DIAGNOSIS — Z7901 Long term (current) use of anticoagulants: Secondary | ICD-10-CM

## 2020-03-13 DIAGNOSIS — I5189 Other ill-defined heart diseases: Secondary | ICD-10-CM

## 2020-03-13 DIAGNOSIS — I495 Sick sinus syndrome: Secondary | ICD-10-CM

## 2020-03-13 DIAGNOSIS — I519 Heart disease, unspecified: Secondary | ICD-10-CM

## 2020-03-13 DIAGNOSIS — E669 Obesity, unspecified: Secondary | ICD-10-CM

## 2020-03-13 DIAGNOSIS — R001 Bradycardia, unspecified: Secondary | ICD-10-CM

## 2020-03-13 DIAGNOSIS — I480 Paroxysmal atrial fibrillation: Secondary | ICD-10-CM

## 2020-03-13 NOTE — Progress Notes (Signed)
Patient ID: [name], male   DOB: [birth year], 74 y.o.   MRN: [id number]   Primary M.D.: Dr. Newt [name]  HPI: [name] is a 74 y.o. male who presents to the office for a 6 month cardiology evaluation.  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 He has a long-standing history of hypertension. A CT scan in 2013 suggested small lacunar infarctions most likely due to hypertension. I initially saw in April 2013 after this CT imaging study was done. He has documented normal systolic function with mild concentric LVH with diastolic dysfunction. An echo Doppler study in April 2013 revealed his mitral valve E/A ratio is 3.8 suggestive of restrictive physiology. He has documented renal cysts involving the left kidney showing a large inferior pole cortical cyst is measured 4.5 x 4.7 x 4.4 cm. In addition there was a second area at the mid pole laterally which appeared multi-septated and measured 2.7 x 2.7 x 1.9. I  recommended urologic evaluation which he did at [name] and he was told that these were benign.  Joseph [name] has a history of moderate obesity.  In the past.  He had weight as high as 250 pounds.  Over the past several years.  His weight has been in the 220s.  When I last saw him, he was walking 3 miles at a time 3 days per week and denied any exertional chest pain or palpitations.   Over the past several years, he has been seen by Joseph Deforest PA and also saw [name], [name] for blood pressure management.  He last saw [name] on 03/18/2017, who switched losartan to irbesartan.  His blood pressure medications now include amlodipine 10 mg, irbesartan 300 mg, doxazosin 8 mg,minoxidil 5 mg, and Toprol, which she has been taking 25 mg as needed, but has not been taking this regularly.  He denies any episodes of chest pressure.  His last echo Doppler study was in December 2016 which showed mild LVH, EF 50-55% with grade 2 diastolic  dysfunction.   When I  saw him in October 2018 he complained of fatigue and had noted a slow pulse.  He was bradycardic with heart rates in the 40s and I discontinued Toprol.  He was hypertensive and I recommended initiation of hydralazine 25 mg twice a day.  I scheduled him for renal Doppler studies to make certain there was no renal vascular etiology to his hypertension on multiple medical drug regimen.  Renal Doppler study did not demonstrate any evidence for renal artery stenosis.  He again was noted to have previously documented renal cysts with the right upper pole cyst at 1.5 x 1.7 cm, and a left exophytic lower pole cyst at 4.5 x 5.4 cm and left upper pole striated cyst at 3.6 x 2.5 cm.  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  He denies any abdominal complaints.  He had stopped taking hydralazine when he called the office in December 2018 due to feeling that he was having lower extremity edema.  He also felt that the hydralazine was contributing to his fatigue.  His edema has improved with diabetic socks.    I saw him in January 2019 at which time he was bradycardic with a heart rate at 47 bpm.  He was unaware of any recurrent atrial fibrillation episodes and continue to be on [name] 5 mg twice a day.  His blood pressure was controlled on amlodipine 10 mg, Cardura 4 mg at bedtime, Erbe Sartain 300 mg, in addition to minoxidil 5 mg daily.  I saw him in January 2020 and at that time his blood pressure was elevated at 158/80.  With his previous history of grade 2 diastolic dysfunction I elected to add Spironolactone 12.5 mg to his regimen. Remotely I had placed him on hydralazine which he did not tolerate.  He was evaluated by me in a telemedicine visit in April 2020.  He denied any chest pain or awareness of recurrent arrhythmia.  He states his blood pressure at times is up and down.  He apparently has only been taking the Spironolactone  every other day.  At times his blood pressure can reach 329 systolically.  Pulse 50.  He recently began to exercise more than he had previously and has been walking 3 miles at least 3 days/week.  He underwent a follow-up echo Doppler study in February 2020 which showed mild concentric LVH with EF 55 to 60%.  Diastolic function could not be evaluated due to poor image quality  When I saw him on August 10, 2019  he informed me that his blood pressure was typically  greater than 518 systolically but at times there have been some instances where it was in the 120s.  At times his heart rate was low but he denied any significant dizziness.  His ECG however showed bradycardia at 47 bpm with a junctional rhythm and intermittent P waves.  I recommended follow-up laboratory and suggested he wear a 2-week event monitor or Zio patch.    I saw him in follow-up on October 03, 2019.  At that time, Zio patch data was not yet back.  This ultimately demonstrated an average heart rate at 68 bpm with a minimum of 36 which occurred at 5:37 AM and fastest sinus rate at 149 bpm.  Was a single episode of 5 beat run of nonsustained VT and there were several episodes of SVT.  He was evaluated in the emergency room on November 06, 2019 with dizziness apparently after eating a brownie at home which was laced with CBD/THC.  Head CT was normal.  He saw [name] May in follow-up prior to undergoing spermatic cord surgery.  [name] was held for the surgery and he tolerated surgery well.  He was last evaluated by [name] on January 10, 2020.  At that time no medication adjustment was made.  His blood pressure was stable on amlodipine 10 mg irbesartan 300 mg and spironolactone 12.5 mg.  Presently, he denies any chest pain.  He does not take his medicine altogether and typically takes amlodipine 10 mg in the latter part of the morning, doxazosin 8 mg at bedtime, irbesartan 300 mg in the morning, minoxidil 2.5 mg and spironolactone 12.5 mg in the  morning.  However he does not take all his medicine at the same time.  This morning he did not take his morning medications.  He typically goes to bed at 11 PA and often wakes up at 3 AM to urinate.  He is often up the good between 8 and 9 AM.  He presents to the office for evaluation.  Past Medical History:  Diagnosis Date  . 1st degree AV block   . Atrial fibrillation ([place])   . Diastolic dysfunction 8/41   grade 1  . Hypertension   . New onset atrial fibrillation ([place]) 06/2015  . RBBB   . Sinus bradycardia   .  Stroke [facility])    no defecits, pt denies having a stroke, says [name] diagnosed this    Past Surgical History:  Procedure Laterality Date  . COLONOSCOPY    . COLONOSCOPY  12/30/2011   Procedure: COLONOSCOPY;  Surgeon: [name], MD;  Location: [facility];  Service: Endoscopy;  Laterality: N/A;  1:45PM  . COLONOSCOPY N/A 04/19/2019   Procedure: COLONOSCOPY;  Surgeon: [name], MD;  Location: [facility];  Service: Endoscopy;  Laterality: N/A;  2:00pm  . EXCISION MASS LOWER EXTREMETIES Left 05/12/2018   Procedure: Left foot plantar fibroma excision;  Surgeon: [name], MD;  Location: [place];  Service: Orthopedics;  Laterality: Left;  . FOOT SURGERY Left   . SPERMATOCELECTOMY Right 11/13/2019   Procedure: EXCISION OF SPERMATIC CORD CYST;  Surgeon: [name], MD;  Location: [facility];  Service: [name];  Laterality: Right;  Marland Kitchen VASECTOMY  11/13/2019   Procedure: VASECTOMY;  Surgeon: [name], MD;  Location: [facility];  Service: [name];;    No Known Allergies  Current Outpatient Medications  Medication Sig Dispense Refill  . alfuzosin (UROXATRAL) 10 MG 24 hr tablet Take 1 tablet (10 mg total) by mouth daily with breakfast. 90 tablet 3  . amLODipine (NORVASC) 10 MG tablet TAKE 1 TABLET(10 MG) BY MOUTH DAILY 90 tablet 1  . apixaban ([name]) 5 MG TABS tablet Take 1 tablet (5 mg total) by mouth 2 (two) times daily. 84 tablet 0  .  cholecalciferol (VITAMIN D) 25 MCG (1000 UT) tablet Take 1,000 Units by mouth daily.    . Coenzyme Q10 (COQ10) 200 MG CAPS Take 200 mg by mouth daily.    Marland Kitchen doxazosin (CARDURA) 8 MG tablet Take 8 mg by mouth at bedtime.     . irbesartan (AVAPRO) 300 MG tablet TAKE 1 TABLET BY MOUTH DAILY (Patient taking differently: Take 300 mg by mouth daily. ) 90 tablet 2  . minoxidil (LONITEN) 2.5 MG tablet Take 5 mg by mouth daily.     . Misc Natural Products (PROSTATE HEALTH PO) Take 1 capsule by mouth daily. Super Beta Prostate    . Multiple Vitamin (MULTIVITAMIN WITH MINERALS) TABS tablet Take 1 tablet by mouth daily.    Vladimir Faster Glycol-Propyl Glycol (LUBRICANT EYE DROPS) 0.4-0.3 % SOLN Place 1 drop into both eyes 3 (three) times daily as needed (dry/irritated eyes.).    Marland Kitchen saw palmetto 500 MG capsule Take 500 mg by mouth every evening.    Marland Kitchen spironolactone (ALDACTONE) 25 MG tablet TAKE 1/2 TABLET BY MOUTH EVERY DAY 60 tablet 7  . Turmeric 500 MG CAPS Take 500 mg by mouth daily.    . vitamin B-12 (CYANOCOBALAMIN) 500 MCG tablet Take 500 mcg by mouth daily.     No current facility-administered medications for this visit.    Social History   Socioeconomic History  . Marital status: Married    Spouse name: Not on file  . Number of children: 5  . Years of education: Not on file  . Highest education level: Not on file  Occupational History  . Not on file  Tobacco Use  . Smoking status: Former Smoker    Packs/day: 0.00    Years: 4.00    Pack years: 0.00    Quit date: 2000    Years since quitting: 21.6  . Smokeless tobacco: Never Used  . Tobacco comment: quit about 40 yrs ago  Vaping Use  . Vaping Use: Never used  Substance and Sexual Activity  . Alcohol use: Yes    Alcohol/week: 0.0 standard drinks    Comment: 6 pack of beer or less in a week  . Drug use: No  . Sexual activity: Not on file  Other Topics Concern  . Not on file  Social History Narrative  . Not on file   Social  Determinants of Health   Financial Resource Strain:   . Difficulty of Paying Living Expenses: Not on file  Food Insecurity:   . Worried About Charity fundraiser in the Last Year: Not on file  . Ran Out of Food in the Last Year: Not on file  Transportation Needs:   . Lack of Transportation (Medical): Not on file  . Lack of Transportation (Non-Medical): Not on file  Physical Activity:   . Days of Exercise per Week: Not on file  . Minutes of Exercise per Session: Not on file  Stress:   . Feeling of Stress : Not on file  Social Connections:   . Frequency of Communication with Friends and Family: Not on file  . Frequency of Social Gatherings with Friends and Family: Not on file  . Attends Religious Services: Not on file  . Active Member of Clubs or Organizations: Not on file  . Attends Archivist Meetings: Not on file  . Marital Status: Not on file  Intimate Partner Violence:   . Fear of Current or Ex-Partner: Not on file  . Emotionally Abused: Not on file  . Physically Abused: Not on file  . Sexually Abused: Not on file    Family History  Problem Relation Age of Onset  . Cancer Brother   . CVA Mother   . Hypertension Father   . Colon cancer Neg Hx     ROS General: Negative; No fevers, chills, or night sweats;  HEENT: Negative; No changes in vision or hearing, sinus congestion, difficulty swallowing Pulmonary: Negative; No cough, wheezing, shortness of breath, hemoptysis Cardiovascular: Negative; No chest pain, presyncope, syncope, palpitations GI: Negative; No nausea, vomiting, diarrhea, or abdominal pain GU: History of renal cysts Musculoskeletal: Negative; no myalgias, joint pain, or weakness Hematologic/Oncology: Negative; no easy bruising, bleeding Endocrine: Negative; no heat/cold intolerance; no diabetes Neuro: Negative; no changes in balance, headaches Skin: Negative; No rashes or skin lesions Psychiatric: Negative; No behavioral problems,  depression Sleep: Negative; No snoring, daytime sleepiness, hypersomnolence, bruxism, restless legs, hypnogognic hallucinations, no cataplexy Other comprehensive 14 point system review is negative.   PE BP 134/65   Pulse (!) 52   Ht _0  (1.778 m)   Wt 216 lb 6.4 oz (98.2 kg)   SpO2 100%   BMI 31.05 kg/m    Repeat blood pressure by me was 155/70 but he did not take his morning medications  Wt Readings from Last 3 Encounters:  03/13/20 216 lb 6.4 oz (98.2 kg)  01/10/20 212 lb (96.2 kg)  01/10/20 216 lb (98 kg)   General: Alert, oriented, no distress.  Skin: normal turgor, no rashes, warm and dry HEENT: Normocephalic, atraumatic. Pupils equal round and reactive to light; sclera anicteric; extraocular muscles intact; Fundi ** Nose without nasal septal hypertrophy Mouth/Parynx benign; Mallinpatti scale 3 Neck: No JVD, no carotid bruits; normal carotid upstroke Lungs: clear to ausculatation and percussion; no wheezing or rales Chest wall: without tenderness to palpitation Heart: PMI not displaced, RRR, s1 s2 normal, 1/6 systolic murmur, no diastolic murmur, no rubs, gallops, thrills, or heaves Abdomen: soft, nontender; no hepatosplenomehaly, BS+;  abdominal aorta nontender and not dilated by palpation. Back: no CVA tenderness Pulses 2+ Musculoskeletal: full range of motion, normal strength, no joint deformities Extremities: no clubbing cyanosis or edema, Homan's sign negative  Neurologic: grossly nonfocal; Cranial nerves grossly wnl Psychologic: Normal mood and affect   ECG (independently read by me): Sinus Bradycardia at 52, PVC, RBBB  October 03, 2019 ECG (independently read by me): Sinus rhythm at 65 bpm with occasional PVCs.  Right bundle branch block with repolarization, left anterior hemiblock, LVH by voltage.  August 10, 2019 ECG (independently read by me): Bradycardia at 47 bpm with junctional rhyhm, intermittent P waves  August 09, 2018 ECG (independently read by  me): Sinus bradycardia at 65 bpm with sinus arrhythmia, PACs, first-degree AV block with a PR interval _0 ms, right bundle branch block with repolarization changes, left anterior hemiblock.  Septal Q wave V1.  July 21, 2017 ECG (independently read by me): Sinus bradycardia at 47 bpm.  First-degree AV block with a PR interval of 222 ms.  Right bundle branch block with repolarization changes.  PAC.  October 2018 ECG (independently read by me): Sinus bradycardia at 47 bpm. Accelerated AV conduction with a PR interval at 88 ms.  Right bundle-branch block with repolarization changes.  However, question blocked PACs every other beat.  April 2016 ECG (independently read by me): Sinus bradycardia at 54 bpm with right bundle branch block.  PAC.  QTc interval 479 msec.  PR interval 196 ms.  September 2014 ECG: Sinus bradycardia with first-degree AV block. Heart rate 46 beats per minute. Right bundle branch block, unchanged.  LABS: BMP Latest Ref Rng & Units 11/06/2019 08/15/2019 08/12/2018  Glucose 70 - 99 mg/dL 142(H) 82 95  BUN 8 - 23 mg/dL _1 Creatinine 0.61 - 1.24 mg/dL 1.10 1.01 1.06  BUN/Creat Ratio 10 - 24 - 15 12  Sodium 135 - 145 mmol/L 136 138 140  Potassium 3.5 - 5.1 mmol/L 4.0 4.6 4.7  Chloride 98 - 111 mmol/L 104 100 102  CO2 22 - 32 mmol/L _2 Calcium 8.9 - 10.3 mg/dL 8.5(L) 9.5 9.4   Hepatic Function Latest Ref Rng & Units 11/06/2019 08/15/2019 08/12/2018  Total Protein 6.5 - 8.1 g/dL 7.1 7.0 6.8  Albumin 3.5 - 5.0 g/dL 4.0 4.4 4.2  AST 15 - 41 U/L _3 ALT 0 - 44 U/L _4 Alk Phosphatase 38 - 126 U/L 90 114 99  Total Bilirubin 0.3 - 1.2 mg/dL 0.8 1.1 1.2   CBC Latest Ref Rng & Units 11/06/2019 08/15/2019 08/12/2018  WBC 4.0 - 10.5 K/uL 5.4 4.2 3.6  Hemoglobin 13.0 - 17.0 g/dL 11.8(L) 13.2 12.4(L)  Hematocrit 39 - 52 % 38.7(L) 41.7 40.3  Platelets 150 - 400 K/uL 231 242 236   Lab Results  Component Value Date   MCV 71.3 (L) 11/06/2019   MCV 70 (L)  08/15/2019   MCV 70 (L) 08/12/2018   Lab Results  Component Value Date   TSH 0.625 11/06/2019   No results found for: HGBA1C   Lipid Panel     Component Value Date/Time   CHOL 165 08/15/2019 1410   TRIG 56 08/15/2019 1410   HDL 73 08/15/2019 1410   CHOLHDL 2.3 08/15/2019 1410   LDLCALC 81 08/15/2019 1410    RADIOLOGY: No results found.  IMPRESSION:  1. Essential hypertension   2. Bradycardia   3. Sinus node dysfunction (HCC)   4.  Paroxysmal atrial fibrillation (HCC)   5. Paroxysmal SVT (supraventricular tachycardia) (HCC)   6. Chronic anticoagulation   7. Grade II diastolic dysfunction   8. Mild obesity      ASSESSMENT AND PLAN: Joseph [name] is 74 year old retired [place] American male who has a history of hypertension and CT evidence for small lacunar infarctions most likely of hypertensive etiology. There also is a remote history of rheumatic fever age 62. He had developed transient symptoms of right arm weakness with numbness in August 2012 which led to his MRI of his head which showed scattered small white matter hyper intensities bilaterally with also involvement in the right putamen, bilateral thalamus the also he has some additional changes in the pons, right and left inferior cerebellum.  A prior echo Doppler study has shown restrictive physiology.  His  echo Doppler study in December 2016 showed an EF of 50-55%, LVH, and grade 2 diastolic dysfunction.  There was mild biatrial enlargement.    He has had issues in the past with bradycardia leading to discontinuance of his metoprolol.  When I saw him in August 10, 2019, his ECG showed a junctional rhythm there were periods where there were P waves with short PR interval and other times without P waves.  He was not on any negative chronotropic medications.  He underwent laboratory which essentially was normal.  Creatinine was 1.01 with potassium 4.6.  Abnormal LFTs.  TSH was normal at 1.28.  Lipid studies were stable with  total cholesterol 165 triglycerides 56 HDL 73 LDL 81.  Magnesium was 1.9.  He was not anemic but had microcytic indices with an MCV of 70 which is consistent with his known thalassemia.  When last seen in the office his ECG showed resolution of his previous junctional rhythm.  I reviewed his Zio patch findings with him.  He does have sinus node dysfunction but also had periods of short-lived SVT with interval lasting 4 beats with a maximum rate at 190 and the longest interval lasting 21.3 seconds with an average rate of 100 bpm.  There were isolated PACs, rare atrial couplets or triplets.  There was an episode of ventricular bigeminy.  He continues to be on [name] for anticoagulation.  His blood pressure today is elevated and he states his blood pressure in the morning typically his cell.  He did not take this morning medication.  I have recommended he take his medicines in the morning but switch amlodipine to 10 mg at bedtime which should provide him early morning coverage.  He believes he is sleeping well but I discussed with him that bradycardia can develop in patients with sleep apnea as well as episodes of tachydysrhythmias.  He has not had recent echo Doppler study.  I have recommended a follow-up evaluation be done prior to his next office visit in approximately 6 months.   Troy Sine, MD, [name]  03/16/2020 3:47 PM

## 2020-03-13 NOTE — Patient Instructions (Addendum)
Medication Instructions:  TAKE YOUR AMLODIPINE AT NIGHT  If you need a refill on your cardiac medications before your next appointment, please call your pharmacy*   Testing/Procedures:in february Your physician has requested that you have an echocardiogram. Echocardiography is a painless test that uses sound waves to create images of your heart. It provides your doctor with information about the size and shape of your heart and how well your heart's chambers and valves are working. This procedure takes approximately one hour. There are no restrictions for this procedure.     Follow-Up: At [facility], you and your health needs are our priority.  As part of our continuing mission to provide you with exceptional heart care, we have created designated Provider Care Teams.  These Care Teams include your primary Cardiologist (physician) and Advanced Practice Providers (APPs -  Physician Assistants and Nurse Practitioners) who all work together to provide you with the care you need, when you need it.  We recommend signing up for the patient portal called "MyChart".  Sign up information is provided on this After Visit Summary.  MyChart is used to connect with patients for Virtual Visits (Telemedicine).  Patients are able to view lab/test results, encounter notes, upcoming appointments, etc.  Non-urgent messages can be sent to your provider as well.   To learn more about what you can do with MyChart, go to [email or web address].    Your next appointment:   5 month(s) in february  The format for your next appointment:   In Person  Provider:   Shelva Majestic, MD

## 2020-03-16 ENCOUNTER — Encounter: Payer: Self-pay | Admitting: Cardiovascular Disease

## 2020-03-30 ENCOUNTER — Other Ambulatory Visit: Payer: Medicare PPO

## 2020-03-30 ENCOUNTER — Other Ambulatory Visit: Payer: Self-pay

## 2020-03-30 DIAGNOSIS — Z20822 Contact with and (suspected) exposure to covid-19: Secondary | ICD-10-CM

## 2020-04-02 LAB — SPECIMEN STATUS REPORT

## 2020-04-02 LAB — NOVEL CORONAVIRUS, NAA: SARS-CoV-2, NAA: NOT DETECTED

## 2020-04-02 LAB — SARS-COV-2, NAA 2 DAY TAT

## 2020-04-20 ENCOUNTER — Other Ambulatory Visit: Payer: Self-pay | Admitting: Cardiovascular Disease

## 2020-06-07 ENCOUNTER — Other Ambulatory Visit: Payer: Self-pay | Admitting: Cardiovascular Disease

## 2020-06-27 ENCOUNTER — Encounter: Payer: Self-pay | Admitting: Urology

## 2020-06-27 ENCOUNTER — Other Ambulatory Visit: Payer: Self-pay

## 2020-06-27 ENCOUNTER — Ambulatory Visit (INDEPENDENT_AMBULATORY_CARE_PROVIDER_SITE_OTHER): Payer: Medicare PPO | Admitting: Urology

## 2020-06-27 VITALS — BP 139/65 | HR 69 | Temp 99.1°F | Ht 70.0 in | Wt 212.0 lb

## 2020-06-27 DIAGNOSIS — N138 Other obstructive and reflux uropathy: Secondary | ICD-10-CM

## 2020-06-27 DIAGNOSIS — N401 Enlarged prostate with lower urinary tract symptoms: Secondary | ICD-10-CM

## 2020-06-27 DIAGNOSIS — R351 Nocturia: Secondary | ICD-10-CM

## 2020-06-27 LAB — URINALYSIS, ROUTINE W REFLEX MICROSCOPIC
Bilirubin, UA: NEGATIVE
Glucose, UA: NEGATIVE
Ketones, UA: NEGATIVE
Leukocytes,UA: NEGATIVE
Nitrite, UA: NEGATIVE
Protein,UA: NEGATIVE
RBC, UA: NEGATIVE
Specific Gravity, UA: 1.025 (ref 1.005–1.030)
Urobilinogen, Ur: 0.2 mg/dL (ref 0.2–1.0)
pH, UA: 5 (ref 5.0–7.5)

## 2020-06-27 MED ORDER — ALFUZOSIN HCL ER 10 MG PO TB24: 10.0000 mg | ORAL_TABLET | Freq: Every day | ORAL | 3 refills | Status: DC

## 2020-06-27 MED ORDER — MIRABEGRON ER 25 MG PO TB24
25.0000 mg | ORAL_TABLET | Freq: Every day | ORAL | 0 refills | Status: DC
Start: 2020-06-27 — End: 2021-06-30

## 2020-06-27 NOTE — Progress Notes (Signed)
06/27/2020 11:19 AM   Joseph [name] April 21, 1946 [id number]  Referring provider: Lemmie Evens, MD [place],  [place] [address]  followup nocturia  HPI: [name] is a 74yo here for followup for BPH with nocturia. He is on uroxatral 10mg  daily which he takes intermittently. He has nocturia 2-3x depending on water intake. He continue to have issues with urinary frequency every 1-2 hours. The voids are small volume. Stream is strong. No hesitancy, no dysuria.    PMH: Past Medical History:  Diagnosis Date  . 1st degree AV block   . Atrial fibrillation ([place])   . Diastolic dysfunction 9/16   grade 1  . Hypertension   . New onset atrial fibrillation ([place]) 06/2015  . RBBB   . Sinus bradycardia   . Stroke [facility])    no defecits, pt denies having a stroke, says [name] diagnosed this    Surgical History: Past Surgical History:  Procedure Laterality Date  . COLONOSCOPY    . COLONOSCOPY  12/30/2011   Procedure: COLONOSCOPY;  Surgeon: [name], MD;  [facility];  Service: Endoscopy;  Laterality: N/A;  1:45PM  . COLONOSCOPY N/A 04/19/2019   Procedure: COLONOSCOPY;  Surgeon: [name], MD;  [facility];  Service: Endoscopy;  Laterality: N/A;  2:00pm  . EXCISION MASS LOWER EXTREMETIES Left 05/12/2018   Procedure: Left foot plantar fibroma excision;  Surgeon: [name], MD;  Location: [facility];  Service: Orthopedics;  Laterality: Left;  . FOOT SURGERY Left   . SPERMATOCELECTOMY Right 11/13/2019   Procedure: EXCISION OF SPERMATIC CORD CYST;  Surgeon: [name], MD;  Location: [facility];  Service: Urology;  Laterality: Right;  Marland Kitchen VASECTOMY  11/13/2019   Procedure: VASECTOMY;  Surgeon: [name], MD;  Location: [facility];  Service: Urology;;    Home Medications:  Allergies as of 06/27/2020   No Known Allergies     Medication List       Accurate as of June 27, 2020 11:19 AM. If you have any  questions, ask your nurse or doctor.        alfuzosin 10 MG 24 hr tablet Commonly known as: UROXATRAL Take 1 tablet (10 mg total) by mouth daily with breakfast.   amLODipine 10 MG tablet Commonly known as: NORVASC TAKE 1 TABLET(10 MG) BY MOUTH DAILY   apixaban 5 MG Tabs tablet Commonly known as: Eliquis Take 1 tablet (5 mg total) by mouth 2 (two) times daily.   cholecalciferol 25 MCG (1000 UNIT) tablet Commonly known as: VITAMIN D Take 1,000 Units by mouth daily.   CoQ10 200 MG Caps Take 200 mg by mouth daily.   doxazosin 8 MG tablet Commonly known as: CARDURA Take 8 mg by mouth at bedtime.   hydrochlorothiazide 25 MG tablet Commonly known as: HYDRODIURIL   irbesartan 300 MG tablet Commonly known as: AVAPRO TAKE 1 TABLET BY MOUTH DAILY   Lubricant Eye Drops 0.4-0.3 % Soln Generic drug: Polyethyl Glycol-Propyl Glycol Place 1 drop into both eyes 3 (three) times daily as needed (dry/irritated eyes.).   minoxidil 2.5 MG tablet Commonly known as: LONITEN Take 5 mg by mouth daily.   multivitamin with minerals Tabs tablet Take 1 tablet by mouth daily.   PROSTATE HEALTH PO Take 1 capsule by mouth daily. Super Beta Prostate   saw palmetto 500 MG capsule Take 500 mg by mouth every evening.   spironolactone 25 MG tablet Commonly known as: ALDACTONE  TAKE 1/2 TABLET BY MOUTH EVERY DAY   Turmeric 500 MG Caps Take 500 mg by mouth daily.   vitamin B-12 500 MCG tablet Commonly known as: CYANOCOBALAMIN Take 500 mcg by mouth daily.       Allergies: No Known Allergies  Family History: Family History  Problem Relation Age of Onset  . Cancer Brother   . CVA Mother   . Hypertension Father   . Colon cancer Neg Hx     Social History:  reports that he quit smoking about 21 years ago. He smoked 0.00 packs per day for 4.00 years. He has never used smokeless tobacco. He reports current alcohol use. He reports that he does not use drugs.  ROS: All other review of  systems were reviewed and are negative except what is noted above in HPI  Physical Exam: BP 139/65   Pulse 69   Temp 99.1 F (37.3 C)   Ht 5\' 10"  (1.778 m)   Wt 212 lb (96.2 kg)   BMI 30.42 kg/m   Constitutional:  Alert and oriented, No acute distress. HEENT: [place] AT, moist mucus membranes.  Trachea midline, no masses. Cardiovascular: No clubbing, cyanosis, or edema. Respiratory: Normal respiratory effort, no increased work of breathing. GI: Abdomen is soft, nontender, nondistended, no abdominal masses GU: No CVA tenderness.  Lymph: No cervical or inguinal lymphadenopathy. Skin: No rashes, bruises or suspicious lesions. Neurologic: Grossly intact, no focal deficits, moving all 4 extremities. Psychiatric: Normal mood and affect.  Laboratory Data: Lab Results  Component Value Date   WBC 5.4 11/06/2019   HGB 11.8 (L) 11/06/2019   HCT 38.7 (L) 11/06/2019   MCV 71.3 (L) 11/06/2019   PLT 231 11/06/2019    Lab Results  Component Value Date   CREATININE 1.10 11/06/2019    No results found for: PSA  No results found for: TESTOSTERONE  No results found for: HGBA1C  Urinalysis    Component Value Date/Time   COLORURINE YELLOW 11/06/2019 2130   APPEARANCEUR CLEAR 11/06/2019 2130   LABSPEC 1.016 11/06/2019 2130   PHURINE 5.0 11/06/2019 2130   GLUCOSEU NEGATIVE 11/06/2019 2130   HGBUR NEGATIVE 11/06/2019 2130   [place] NEGATIVE 11/06/2019 2130   [place] NEGATIVE 11/06/2019 2130   PROTEINUR NEGATIVE 11/06/2019 2130   NITRITE NEGATIVE 11/06/2019 2130   LEUKOCYTESUR NEGATIVE 11/06/2019 2130    No results found for: LABMICR, [place], RBCUA, LABEPIT, MUCUS, BACTERIA  Pertinent Imaging:  No results found for this or any previous visit.  No results found for this or any previous visit.  No results found for this or any previous visit.  No results found for this or any previous visit.  No results found for this or any previous visit.  No results found for this or  any previous visit.  No results found for this or any previous visit.  No results found for this or any previous visit.   Assessment & Plan:    1. Nocturia -continue uroxatral 10mg  qhs  2. Benign prostatic hyperplasia with urinary obstruction -continue uroxatral 10mg  qhs  3. Urinary frequency -We will trial mirabegron 25mg     No follow-ups on file.  Nicolette Bang, MD  [facility] [place]

## 2020-06-27 NOTE — Patient Instructions (Signed)

## 2020-06-27 NOTE — Progress Notes (Signed)
Urological Symptom Review  Patient is experiencing the following symptoms: Frequent urination Hard to postpone urination Get up at night to urinate Leakage of urine   Review of Systems  Gastrointestinal (upper)  : Negative for upper GI symptoms  Gastrointestinal (lower) : Negative for lower GI symptoms  Constitutional : Negative for symptoms  Skin: Negative for skin symptoms  Eyes: Negative for eye symptoms  Ear/Nose/Throat : Negative for Ear/Nose/Throat symptoms  Hematologic/Lymphatic: Negative for Hematologic/Lymphatic symptoms  Cardiovascular : Negative for cardiovascular symptoms  Respiratory : Negative for respiratory symptoms  Endocrine: Negative for endocrine symptoms  Musculoskeletal: Negative for musculoskeletal symptoms  Neurological: Negative for neurological symptoms  Psychologic: Negative for psychiatric symptoms 

## 2020-07-02 ENCOUNTER — Other Ambulatory Visit: Payer: Self-pay | Admitting: Family Medicine

## 2020-07-02 DIAGNOSIS — I10 Essential (primary) hypertension: Secondary | ICD-10-CM

## 2020-07-24 ENCOUNTER — Ambulatory Visit (HOSPITAL_COMMUNITY): Admission: RE | Admit: 2020-07-24 | Payer: Medicare PPO | Source: Ambulatory Visit

## 2020-07-24 ENCOUNTER — Encounter (HOSPITAL_COMMUNITY): Payer: Self-pay

## 2020-08-13 ENCOUNTER — Ambulatory Visit (HOSPITAL_COMMUNITY): Payer: Medicare PPO | Attending: Internal Medicine

## 2020-08-13 ENCOUNTER — Other Ambulatory Visit: Payer: Self-pay

## 2020-08-13 DIAGNOSIS — I480 Paroxysmal atrial fibrillation: Secondary | ICD-10-CM | POA: Diagnosis present

## 2020-08-13 DIAGNOSIS — Z7901 Long term (current) use of anticoagulants: Secondary | ICD-10-CM | POA: Insufficient documentation

## 2020-08-13 DIAGNOSIS — I471 Supraventricular tachycardia: Secondary | ICD-10-CM | POA: Insufficient documentation

## 2020-08-13 DIAGNOSIS — R001 Bradycardia, unspecified: Secondary | ICD-10-CM | POA: Insufficient documentation

## 2020-08-13 LAB — ECHOCARDIOGRAM COMPLETE
Area-P 1/2: 3.12 cm2
S' Lateral: 3.8 cm

## 2020-08-19 ENCOUNTER — Ambulatory Visit (INDEPENDENT_AMBULATORY_CARE_PROVIDER_SITE_OTHER): Payer: Medicare PPO | Admitting: Cardiovascular Disease

## 2020-08-19 ENCOUNTER — Ambulatory Visit (INDEPENDENT_AMBULATORY_CARE_PROVIDER_SITE_OTHER): Payer: Medicare PPO

## 2020-08-19 ENCOUNTER — Encounter: Payer: Self-pay | Admitting: *Deleted

## 2020-08-19 ENCOUNTER — Encounter: Payer: Self-pay | Admitting: Cardiovascular Disease

## 2020-08-19 ENCOUNTER — Other Ambulatory Visit: Payer: Self-pay

## 2020-08-19 VITALS — BP 160/68 | HR 48 | Ht 70.0 in | Wt 213.6 lb

## 2020-08-19 DIAGNOSIS — R001 Bradycardia, unspecified: Secondary | ICD-10-CM

## 2020-08-19 DIAGNOSIS — I5032 Chronic diastolic (congestive) heart failure: Secondary | ICD-10-CM

## 2020-08-19 DIAGNOSIS — I4510 Unspecified right bundle-branch block: Secondary | ICD-10-CM

## 2020-08-19 DIAGNOSIS — I471 Supraventricular tachycardia: Secondary | ICD-10-CM | POA: Diagnosis not present

## 2020-08-19 DIAGNOSIS — I10 Essential (primary) hypertension: Secondary | ICD-10-CM | POA: Diagnosis not present

## 2020-08-19 DIAGNOSIS — I495 Sick sinus syndrome: Secondary | ICD-10-CM | POA: Diagnosis not present

## 2020-08-19 DIAGNOSIS — Z7901 Long term (current) use of anticoagulants: Secondary | ICD-10-CM

## 2020-08-19 NOTE — Progress Notes (Signed)
Patient ID: [name], male   DOB: February 25, 1946, 75 y.o.   MRN: [id number]   Primary M.D.: Dr. Newt [name]  HPI: Joseph [name] is a 75 y.o. male who presents to the office for a 5 month cardiology evaluation.  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 He has a long-standing history of hypertension. A CT scan in 2013 suggested small lacunar infarctions most likely due to hypertension. I initially saw in April 2013 after this CT imaging study was done. He has documented normal systolic function with mild concentric LVH with diastolic dysfunction. An echo Doppler study in April 2013 revealed his mitral valve E/A ratio is 3.8 suggestive of restrictive physiology. He has documented renal cysts involving the left kidney showing a large inferior pole cortical cyst is measured 4.5 x 4.7 x 4.4 cm. In addition there was a second area at the mid pole laterally which appeared multi-septated and measured 2.7 x 2.7 x 1.9. I  recommended urologic evaluation which he did at [name] and he was told that these were benign.  Joseph [name] has a history of moderate obesity.  In the past.  He had weight as high as 250 pounds.  Over the past several years.  His weight has been in the 220s.  When I last saw him, he was walking 3 miles at a time 3 days per week and denied any exertional chest pain or palpitations.   Over the past several years, he has been seen by [name] and also saw [name], [name] for blood pressure management.  He last saw [name] on 03/18/2017, who switched losartan to irbesartan.  His blood pressure medications now include amlodipine 10 mg, irbesartan 300 mg, doxazosin 8 mg,minoxidil 5 mg, and Toprol, which she has been taking 25 mg as needed, but has not been taking this regularly.  He denies any episodes of chest pressure.  His last echo Doppler study was in December 2016 which showed mild LVH, EF 50-55% with grade 2 diastolic  dysfunction.   When I saw him in October 2018 he complained of fatigue and had noted a slow pulse.  He was bradycardic with heart rates in the 40s and I discontinued Toprol.  He was hypertensive and I recommended initiation of hydralazine 25 mg twice a day.  I scheduled him for renal Doppler studies to make certain there was no renal vascular etiology to his hypertension on multiple medical drug regimen.  Renal Doppler study did not demonstrate any evidence for renal artery stenosis.  He again was noted to have previously documented renal cysts with the right upper pole cyst at 1.5 x 1.7 cm, and a left exophytic lower pole cyst at 4.5 x 5.4 cm and left upper pole striated cyst at 3.6 x 2.5 cm.  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  He denies any abdominal complaints.  He had stopped taking hydralazine when he called the office in December 2018 due to feeling that he was having lower extremity edema.  He also felt that the hydralazine was contributing to his fatigue.  His edema has improved with diabetic socks.    I saw him in January 2019 at which time he was bradycardic with a heart rate at 47 bpm.  He was unaware of any recurrent atrial fibrillation episodes and continue to be on Eliquis 5 mg twice a day.  His blood pressure was controlled on amlodipine 10 mg, Cardura 4 mg at bedtime, Erbe Sartain 300 mg, in addition to minoxidil 5 mg daily.  I saw him in January 2020 and at that time his blood pressure was elevated at 158/80.  With his previous history of grade 2 diastolic dysfunction I elected to add Spironolactone 12.5 mg to his regimen. Remotely I had placed him on hydralazine which he did not tolerate.  He was evaluated by me in a telemedicine visit in April 2020.  He denied any chest pain or awareness of recurrent arrhythmia.  He states his blood pressure at times is up and down.  He apparently has only been taking the Spironolactone  every other day.  At times his blood pressure can reach 379 systolically.  Pulse 50.  He recently began to exercise more than he had previously and has been walking 3 miles at least 3 days/week.  He underwent a follow-up echo Doppler study in February 2020 which showed mild concentric LVH with EF 55 to 60%.  Diastolic function could not be evaluated due to poor image quality  When I saw him on August 10, 2019  he informed me that his blood pressure was typically  greater than 024 systolically but at times there have been some instances where it was in the 120s.  At times his heart rate was low but he denied any significant dizziness.  His ECG however showed bradycardia at 47 bpm with a junctional rhythm and intermittent P waves.  I recommended follow-up laboratory and suggested he wear a 2-week event monitor or Zio patch.    I saw him in follow-up on October 03, 2019.  At that time, Zio patch data was not yet back.  This ultimately demonstrated an average heart rate at 68 bpm with a minimum of 36 which occurred at 5:37 AM and fastest sinus rate at 149 bpm.  Was a single episode of 5 beat run of nonsustained VT and there were several episodes of SVT.  He was evaluated in the emergency room on November 06, 2019 with dizziness apparently after eating a brownie at home which was laced with CBD/THC.  Head CT was normal.  He saw [name] May in follow-up prior to undergoing spermatic cord surgery.  Eliquis was held for the surgery and he tolerated surgery well.  He was last evaluated by [name] on January 10, 2020.  At that time no medication adjustment was made.  His blood pressure was stable on amlodipine 10 mg irbesartan 300 mg and spironolactone 12.5 mg.  I last evaluated him on March 13, 2020 and at that time he denied any recurrent chest pain.  He denies any chest pain.  He does not take his medicine altogether and typically takes amlodipine 10 mg in the latter part of the morning, doxazosin 8 mg at bedtime,  irbesartan 300 mg in the morning, minoxidil 2.5 mg and spironolactone 12.5 mg in the morning.   This morning he did not take his morning medications.  He typically goes to bed at 11 [name] and often wakes up at 3 AM to urinate.  He is often up the good between 8 and 9 AM.  During that evaluation, his blood pressure was elevated and I recommended he take his medicines in the morning but switch amlodipine to 10 mg at bedtime which should provide him with early morning coverage.  He had not had a recent echo Doppler and scheduled him for follow-up evaluation.  The echo Doppler study was done on June 12, 2021 which showed an EF of 55 to 60%.  There was moderate LVH and grade 1 diastolic dysfunction.  Left atrium was moderately dilated.    Presently, he denies any chest pain.  He continues to experience occasional episodes of lightheadedness.  At times he notes pulse irregularity.  He denies shortness of breath.  He denies any significant orthostatic symptoms.  He continues to be on amlodipine 10 mg, irbesartan 300 mg, minoxidil 5 mg, and spironolactone 12.5 mg daily.  He is on Eliquis for anticoagulation and denies bleeding.  He presents for evaluation.  Past Medical History:  Diagnosis Date  . 1st degree AV block   . Atrial fibrillation ([place])   . Diastolic dysfunction 2/29   grade 1  . Hypertension   . New onset atrial fibrillation ([place]) 06/2015  . RBBB   . Sinus bradycardia   . Stroke [name])    no defecits, pt denies having a stroke, says [name] diagnosed this    Past Surgical History:  Procedure Laterality Date  . COLONOSCOPY    . COLONOSCOPY  12/30/2011   Procedure: COLONOSCOPY;  Surgeon: [name], MD;  [facility];  Service: Endoscopy;  Laterality: N/A;  1:45PM  . COLONOSCOPY N/A 04/19/2019   Procedure: COLONOSCOPY;  Surgeon: [name], MD;  [facility];  Service: Endoscopy;  Laterality: N/A;  2:00pm  . EXCISION MASS LOWER EXTREMETIES Left 05/12/2018    Procedure: Left foot plantar fibroma excision;  Surgeon: [name], MD;  Location: [place];  Service: Orthopedics;  Laterality: Left;  . FOOT SURGERY Left   . SPERMATOCELECTOMY Right 11/13/2019   Procedure: EXCISION OF SPERMATIC CORD CYST;  Surgeon: [name], MD;  Location: [facility];  Service: [name];  Laterality: Right;  Marland Kitchen VASECTOMY  11/13/2019   Procedure: VASECTOMY;  Surgeon: [name], MD;  Location: [facility];  Service: [name];;    No Known Allergies  Current Outpatient Medications  Medication Sig Dispense Refill  . alfuzosin (UROXATRAL) 10 MG 24 hr tablet Take 1 tablet (10 mg total) by mouth daily with breakfast. 90 tablet 3  . amLODipine (NORVASC) 10 MG tablet TAKE 1 TABLET(10 MG) BY MOUTH DAILY 90 tablet 1  . apixaban (ELIQUIS) 5 MG TABS tablet Take 1 tablet (5 mg total) by mouth 2 (two) times daily. 84 tablet 0  . cholecalciferol (VITAMIN D) 25 MCG (1000 UT) tablet Take 1,000 Units by mouth daily.    . Coenzyme Q10 (COQ10) 200 MG CAPS Take 200 mg by mouth daily.    Marland Kitchen doxazosin (CARDURA) 8 MG tablet Take 8 mg by mouth at bedtime.     . hydrochlorothiazide (HYDRODIURIL) 25 MG tablet     . irbesartan (AVAPRO) 300 MG tablet TAKE 1 TABLET BY MOUTH DAILY 90 tablet 2  . minoxidil (LONITEN) 2.5 MG tablet Take 5 mg by mouth daily.     . Misc Natural Products (PROSTATE HEALTH PO) Take 1 capsule by mouth daily. Super Beta Prostate    . Multiple Vitamin (MULTIVITAMIN WITH MINERALS) TABS tablet Take 1 tablet by mouth daily.    [name] Glycol-Propyl Glycol (LUBRICANT EYE DROPS) 0.4-0.3 % SOLN Place 1 drop into both eyes 3 (three) times daily as needed (dry/irritated eyes.).    Marland Kitchen saw palmetto 500 MG capsule Take 500 mg by mouth every evening.    Marland Kitchen spironolactone (ALDACTONE) 25 MG tablet TAKE 1/2 TABLET BY MOUTH  EVERY DAY 60 tablet 7  . Turmeric 500 MG CAPS Take 500 mg by mouth daily.    . vitamin B-12 (CYANOCOBALAMIN) 500 MCG tablet Take 500 mcg by mouth  daily.    . mirabegron ER (MYRBETRIQ) 25 MG TB24 tablet Take 1 tablet (25 mg total) by mouth daily. 30 tablet 0   No current facility-administered medications for this visit.    Social History   Socioeconomic History  . Marital status: Married    Spouse name: Not on file  . Number of children: 5  . Years of education: Not on file  . Highest education level: Not on file  Occupational History  . Not on file  Tobacco Use  . Smoking status: Former Smoker    Packs/day: 0.00    Years: 4.00    Pack years: 0.00    Quit date: 2000    Years since quitting: 22.1  . Smokeless tobacco: Never Used  . Tobacco comment: quit about 40 yrs ago  Vaping Use  . Vaping Use: Never used  Substance and Sexual Activity  . Alcohol use: Yes    Alcohol/week: 0.0 standard drinks    Comment: 6 pack of beer or less in a week  . Drug use: No  . Sexual activity: Not on file  Other Topics Concern  . Not on file  Social History Narrative  . Not on file   Social Determinants of Health   Financial Resource Strain: Not on file  Food Insecurity: Not on file  Transportation Needs: Not on file  Physical Activity: Not on file  Stress: Not on file  Social Connections: Not on file  Intimate Partner Violence: Not on file    Family History  Problem Relation Age of Onset  . Cancer Brother   . CVA Mother   . Hypertension Father   . Colon cancer Neg Hx     ROS General: Negative; No fevers, chills, or night sweats;  HEENT: Negative; No changes in vision or hearing, sinus congestion, difficulty swallowing Pulmonary: Negative; No cough, wheezing, shortness of breath, hemoptysis Cardiovascular: See HPI GI: Negative; No nausea, vomiting, diarrhea, or abdominal pain GU: History of renal cysts Musculoskeletal: Negative; no myalgias, joint pain, or weakness Hematologic/Oncology: Negative; no easy bruising, bleeding Endocrine: Negative; no heat/cold intolerance; no diabetes Neuro: Negative; no changes in  balance, headaches Skin: Negative; No rashes or skin lesions Psychiatric: Negative; No behavioral problems, depression Sleep: Negative; No snoring, daytime sleepiness, hypersomnolence, bruxism, restless legs, hypnogognic hallucinations, no cataplexy Other comprehensive 14 point system review is negative.   PE BP (!) 160/68 (BP Location: Left Arm, Patient Position: Sitting)   Pulse (!) 48   Ht '5\' 10"'  (1.778 m)   Wt 213 lb 9.6 oz (96.9 kg)   SpO2 99%   BMI 30.65 kg/m    Repeat blood pressure by me was 124/70 supine 122/70 standing  Wt Readings from Last 3 Encounters:  08/19/20 213 lb 9.6 oz (96.9 kg)  06/27/20 212 lb (96.2 kg)  03/13/20 216 lb 6.4 oz (98.2 kg)   General: Alert, oriented, no distress.  Skin: normal turgor, no rashes, warm and dry HEENT: Normocephalic, atraumatic. Pupils equal round and reactive to light; sclera anicteric; extraocular muscles intact;  Nose without nasal septal hypertrophy Mouth/Parynx benign; Mallinpatti scale 3 Neck: No JVD, no carotid bruits; normal carotid upstroke Lungs: clear to ausculatation and percussion; no wheezing or rales Chest wall: without tenderness to palpitation Heart: PMI not displaced, RRR, s1 s2 normal, 1/6 systolic  murmur, no diastolic murmur, no rubs, gallops, thrills, or heaves Abdomen: soft, nontender; no hepatosplenomehaly, BS+; abdominal aorta nontender and not dilated by palpation. Back: no CVA tenderness Pulses 2+ Musculoskeletal: full range of motion, normal strength, no joint deformities Extremities: no clubbing cyanosis or edema, Homan's sign negative  Neurologic: grossly nonfocal; Cranial nerves grossly wnl Psychologic: Normal mood and affect    ECG (independently read by me): Sinus bradycardia at 48 bpm with  some junctional beats.  Short PR interval at 96 msec PVC.  Right bundle branch block with repolarization changes.  March 13, 2020 ECG (independently read by me): Sinus Bradycardia at 52, PVC,  RBBB  October 03, 2019 ECG (independently read by me): Sinus rhythm at 65 bpm with occasional PVCs.  Right bundle branch block with repolarization, left anterior hemiblock, LVH by voltage.  August 10, 2019 ECG (independently read by me): Bradycardia at 47 bpm with junctional rhyhm, intermittent P waves  August 09, 2018 ECG (independently read by me): Sinus bradycardia at 65 bpm with sinus arrhythmia, PACs, first-degree AV block with a PR interval '2 6 6 ' ms, right bundle branch block with repolarization changes, left anterior hemiblock.  Septal Q wave V1.  July 21, 2017 ECG (independently read by me): Sinus bradycardia at 47 bpm.  First-degree AV block with a PR interval of 222 ms.  Right bundle branch block with repolarization changes.  PAC.  October 2018 ECG (independently read by me): Sinus bradycardia at 47 bpm. Accelerated AV conduction with a PR interval at 88 ms.  Right bundle-branch block with repolarization changes.  However, question blocked PACs every other beat.  April 2016 ECG (independently read by me): Sinus bradycardia at 54 bpm with right bundle branch block.  PAC.  QTc interval 479 msec.  PR interval 196 ms.  September 2014 ECG: Sinus bradycardia with first-degree AV block. Heart rate 46 beats per minute. Right bundle branch block, unchanged.  LABS: BMP Latest Ref Rng & Units 11/06/2019 08/15/2019 08/12/2018  Glucose 70 - 99 mg/dL 142(H) 82 95  BUN 8 - 23 mg/dL '19 15 13  ' Creatinine 0.61 - 1.24 mg/dL 1.10 1.01 1.06  BUN/Creat Ratio 10 - 24 - 15 12  Sodium 135 - 145 mmol/L 136 138 140  Potassium 3.5 - 5.1 mmol/L 4.0 4.6 4.7  Chloride 98 - 111 mmol/L 104 100 102  CO2 22 - 32 mmol/L '24 25 23  ' Calcium 8.9 - 10.3 mg/dL 8.5(L) 9.5 9.4   Hepatic Function Latest Ref Rng & Units 11/06/2019 08/15/2019 08/12/2018  Total Protein 6.5 - 8.1 g/dL 7.1 7.0 6.8  Albumin 3.5 - 5.0 g/dL 4.0 4.4 4.2  AST 15 - 41 U/L '26 22 25  ' ALT 0 - 44 U/L '22 14 19  ' Alk Phosphatase 38 - 126 U/L 90 114 99   Total Bilirubin 0.3 - 1.2 mg/dL 0.8 1.1 1.2   CBC Latest Ref Rng & Units 11/06/2019 08/15/2019 08/12/2018  WBC 4.0 - 10.5 K/uL 5.4 4.2 3.6  Hemoglobin 13.0 - 17.0 g/dL 11.8(L) 13.2 12.4(L)  Hematocrit 39.0 - 52.0 % 38.7(L) 41.7 40.3  Platelets 150 - 400 K/uL 231 242 236   Lab Results  Component Value Date   MCV 71.3 (L) 11/06/2019   MCV 70 (L) 08/15/2019   MCV 70 (L) 08/12/2018   Lab Results  Component Value Date   TSH 0.625 11/06/2019   No results found for: HGBA1C   Lipid Panel     Component Value Date/Time   CHOL 165 08/15/2019  1410   TRIG 56 08/15/2019 1410   HDL 73 08/15/2019 1410   CHOLHDL 2.3 08/15/2019 1410   LDLCALC 81 08/15/2019 1410    RADIOLOGY: No results found.  IMPRESSION:  1. Bradycardia   2. Essential hypertension   3. Paroxysmal SVT (supraventricular tachycardia) ([place])   4. Sinus node dysfunction ([facility])   5. Chronic anticoagulation   6. Chronic diastolic heart failure ([place])   7. RBBB      ASSESSMENT AND PLAN: Joseph [name] is 75 year old retired [place] American male who has a history of hypertension and CT evidence for small lacunar infarctions most likely of hypertensive etiology. There also is a remote history of rheumatic fever age 11. He had developed transient symptoms of right arm weakness with numbness in August 2012. An MRI of his head showed scattered small white matter hyper intensities bilaterally with also involvement in the right putamen, bilateral thalamus the also he has some additional changes in the pons, right and left inferior cerebellum.  A prior echo Doppler study has shown restrictive physiology.  His  echo Doppler study in December 2016 showed an EF of 50-55%, LVH, and grade 2 diastolic dysfunction.  There was mild biatrial enlargement.    He has had issues in the past with bradycardia leading to discontinuance of his metoprolol.  When I saw him in August 10, 2019, his ECG showed a junctional rhythm there were periods where there  were P waves with short PR interval and other times without P waves.  He was not on any negative chronotropic medications.  At that time laboratory revealed a creatinine of  1.01 with potassium 4.6.   TSH was normal at 1.28.  Lipid studies were stable with total cholesterol 165 triglycerides 56 HDL 73 LDL 81.  Magnesium was 1.9.  He was not anemic but had microcytic indices with an MCV of 70 which is consistent with his known thalassemia.  On subsequent ECG there was  resolution of his previous junctional rhythm.  On remote Zio patch monitoring he was felt to have sinus node dysfunction but also had periods of short-lived SVT with interval lasting 4 beats with a maximum rate at 190 and the longest interval lasting 21.3 seconds with an average rate of 100 bpm.  There were isolated PACs, rare atrial couplets or triplets.  There was an episode of ventricular bigeminy.  Presently, he is not on any rate control medications.  He continues to be on Eliquis for anticoagulation.  His ECG today shows sinus bradycardia with periods of short PR as well as junctional rhythm.  I have recommended a follow-up 2-week monitor for reassessment.  He is not orthostatic on blood pressure and his blood pressure today is stable.  He is not having any anginal symptoms.  I reviewed his echo Doppler study with him in detail from August 13, 2020 which showed an EF of 55 to 60%, moderate LVH and grade 1 diastolic dysfunction with moderate left atrial dilatation.  He also had mildly elevated pulmonary artery systolic pressure at 37 mmHg.  I will see him in 3 to 4 months with follow-up evaluation and contact him regarding his monitor results when available.  Troy Sine, MD, [facility]  08/21/2020 1:44 PM

## 2020-08-19 NOTE — Patient Instructions (Addendum)
Medication Instructions:  Your physician recommends that you continue on your current medications as directed. Please refer to the Current Medication list given to you today.  *If you need a refill on your cardiac medications before your next appointment, please call your pharmacy*   Testing/Procedures:  ZIO XT- Long Term Monitor Instructions   Your physician has requested you wear your ZIO patch monitor 14 days.   This is a single patch monitor.  [employer] supplies one patch monitor per enrollment.  Additional stickers are not available.   Please do not apply patch if you will be having a Nuclear Stress Test, Echocardiogram, Cardiac CT, MRI, or Chest Xray during the time frame you would be wearing the monitor. The patch cannot be worn during these tests.  You cannot remove and re-apply the ZIO XT patch monitor.   Your ZIO patch monitor will be sent USPS Priority mail from [employer] directly to your home address. The monitor may also be mailed to a PO BOX if home delivery is not available.   It may take 3-5 days to receive your monitor after you have been enrolled.   Once you have received you monitor, please review enclosed instructions.  Your monitor has already been registered assigning a specific monitor serial # to you.   Applying the monitor   Shave hair from upper left chest.   Hold abrader disc by orange tab.  Rub abrader in 40 strokes over left upper chest as indicated in your monitor instructions.   Clean area with 4 enclosed alcohol pads .  Use all pads to assure are is cleaned thoroughly.  Let dry.   Apply patch as indicated in monitor instructions.  Patch will be place under collarbone on left side of chest with arrow pointing upward.   Rub patch adhesive wings for 2 minutes.Remove white label marked "1".  Remove white label marked "2".  Rub patch adhesive wings for 2 additional minutes.   While looking in a mirror, press and release button in center of patch.  A  small green light will flash 3-4 times .  This will be your only indicator the monitor has been turned on.     Do not shower for the first 24 hours.  You may shower after the first 24 hours.   Press button if you feel a symptom. You will hear a small click.  Record Date, Time and Symptom in the Patient Log Book.   When you are ready to remove patch, follow instructions on last 2 pages of Patient Log Book.  Stick patch monitor onto last page of Patient Log Book.   Place Patient Log Book in Blue box.  Use locking tab on box and tape box closed securely.  The Orange and White box has prepaid postage on it.  Please place in mailbox as soon as possible.  Your physician should have your test results approximately 7 days after the monitor has been mailed back to [employer].   Call [employer] Customer Care at [phone] if you have questions regarding your ZIO XT patch monitor.  Call them immediately if you see an orange light blinking on your monitor.   If your monitor falls off in less than 4 days contact our Monitor department at [phone].  If your monitor becomes loose or falls off after 4 days call [employer] at [phone] for suggestions on securing your monitor.     Follow-Up: At [facility], you and your health needs are our priority.    our continuing mission to provide you with exceptional heart care, we have created designated Provider Care Teams.  These Care Teams include your primary Cardiologist (physician) and Advanced Practice Providers (APPs -  Physician Assistants and Nurse Practitioners) who all work together to provide you with the care you need, when you need it.  We recommend signing up for the patient portal called "MyChart".  Sign up information is provided on this After Visit Summary.  MyChart is used to connect with patients for Virtual Visits (Telemedicine).  Patients are able to view lab/test results, encounter notes, upcoming appointments, etc.   Non-urgent messages can be sent to your provider as well.   To learn more about what you can do with MyChart, go to [email or web address].    Your next appointment:   3-4 month(s)  The format for your next appointment:   In Person  Provider:   Shelva Majestic, MD

## 2020-08-19 NOTE — Progress Notes (Signed)
Patient ID: Joseph [name], male   DOB: 15-Jul-1945, 75 y.o.   MRN: [id number] Patient enrolled for [employer] to ship a 14 day ZIO XT long term holter monitor to her home.

## 2020-08-21 ENCOUNTER — Encounter: Payer: Self-pay | Admitting: Cardiovascular Disease

## 2020-08-23 ENCOUNTER — Ambulatory Visit (HOSPITAL_COMMUNITY): Payer: Medicare PPO

## 2020-08-25 DIAGNOSIS — R001 Bradycardia, unspecified: Secondary | ICD-10-CM | POA: Diagnosis not present

## 2020-09-12 ENCOUNTER — Ambulatory Visit (HOSPITAL_COMMUNITY): Payer: Medicare PPO

## 2020-10-04 ENCOUNTER — Ambulatory Visit (HOSPITAL_COMMUNITY): Payer: Medicare PPO

## 2020-10-29 ENCOUNTER — Ambulatory Visit (HOSPITAL_COMMUNITY): Payer: Medicare PPO

## 2020-10-29 ENCOUNTER — Encounter (HOSPITAL_COMMUNITY): Payer: Self-pay

## 2020-10-31 ENCOUNTER — Other Ambulatory Visit: Payer: Self-pay

## 2020-10-31 MED ORDER — METOPROLOL TARTRATE 25 MG PO TABS
ORAL_TABLET | ORAL | 3 refills | Status: DC
Start: 2020-10-31 — End: 2022-02-04

## 2020-11-28 ENCOUNTER — Other Ambulatory Visit: Payer: Self-pay | Admitting: Cardiovascular Disease

## 2020-12-19 ENCOUNTER — Ambulatory Visit (INDEPENDENT_AMBULATORY_CARE_PROVIDER_SITE_OTHER): Payer: Medicare PPO | Admitting: Cardiovascular Disease

## 2020-12-19 ENCOUNTER — Encounter: Payer: Self-pay | Admitting: Cardiovascular Disease

## 2020-12-19 ENCOUNTER — Other Ambulatory Visit: Payer: Self-pay

## 2020-12-19 VITALS — BP 148/72 | HR 54 | Ht 70.0 in | Wt 212.0 lb

## 2020-12-19 DIAGNOSIS — I471 Supraventricular tachycardia: Secondary | ICD-10-CM | POA: Diagnosis not present

## 2020-12-19 DIAGNOSIS — Z7901 Long term (current) use of anticoagulants: Secondary | ICD-10-CM

## 2020-12-19 DIAGNOSIS — I10 Essential (primary) hypertension: Secondary | ICD-10-CM

## 2020-12-19 DIAGNOSIS — I5189 Other ill-defined heart diseases: Secondary | ICD-10-CM

## 2020-12-19 DIAGNOSIS — I495 Sick sinus syndrome: Secondary | ICD-10-CM

## 2020-12-19 DIAGNOSIS — I4510 Unspecified right bundle-branch block: Secondary | ICD-10-CM

## 2020-12-19 NOTE — Progress Notes (Signed)
Patient ID: Joseph [name], male   DOB: 04-02-1946, 74 y.o.   MRN: [id number]   Primary M.D.: Dr. Newt Minion  HPI: Joseph [name] is a 75 y.o. male who presents to the office for a 4 month cardiology evaluation.  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 He has a long-standing history of hypertension. A CT scan in 2013 suggested small lacunar infarctions most likely due to hypertension. I initially saw in April 2013 after this CT imaging study was done. He has documented normal systolic function with mild concentric LVH with diastolic dysfunction. An echo Doppler study in April 2013 revealed his mitral valve E/A ratio is 3.8 suggestive of restrictive physiology. He has documented renal cysts involving the left kidney showing a large inferior pole cortical cyst is measured 4.5 x 4.7 x 4.4 cm. In addition there was a second area at the mid pole laterally which appeared multi-septated and measured 2.7 x 2.7 x 1.9. I  recommended urologic evaluation which he did at [facility] and he was told that these were benign.  [name] has a history of moderate obesity.  In the past.  He had weight as high as 250 pounds.  Over the past several years.  His weight has been in the 220s.  When I last saw him, he was walking 3 miles at a time 3 days per week and denied any exertional chest pain or palpitations.   Over the past several years, he has been seen by [name] and also saw [name], [facility] for blood pressure management.  He last saw [name] on 03/18/2017, who switched losartan to irbesartan.  His blood pressure medications now include amlodipine 10 mg, irbesartan 300 mg, doxazosin 8 mg,minoxidil 5 mg, and Toprol, which she has been taking 25 mg as needed, but has not been taking this regularly.  He denies any episodes of chest pressure.  His last echo Doppler study was in December 2016 which showed mild LVH, EF 50-55% with grade 2 diastolic  dysfunction.   When I saw him in October 2018 he complained of fatigue and had noted a slow pulse.  He was bradycardic with heart rates in the 40s and I discontinued Toprol.  He was hypertensive and I recommended initiation of hydralazine 25 mg twice a day.  I scheduled him for renal Doppler studies to make certain there was no renal vascular etiology to his hypertension on multiple medical drug regimen.  Renal Doppler study did not demonstrate any evidence for renal artery stenosis.  He again was noted to have previously documented renal cysts with the right upper pole cyst at 1.5 x 1.7 cm, and a left exophytic lower pole cyst at 4.5 x 5.4 cm and left upper pole striated cyst at 3.6 x 2.5 cm.  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  He denies any abdominal complaints.  He had stopped taking hydralazine when he called the office in December 2018 due to feeling that he was having lower extremity edema.  He also felt that the hydralazine was contributing to his fatigue.  His edema has improved with diabetic socks.    I saw him in January 2019 at which time he was bradycardic with a heart rate at 47 bpm.  He was unaware of any recurrent atrial fibrillation episodes and continue to be on Eliquis 5 mg twice a day.  His blood pressure was controlled on amlodipine 10 mg, Cardura 4 mg at bedtime, Erbe Sartain 300 mg, in addition to minoxidil 5 mg daily.  I saw him in January 2020 and at that time his blood pressure was elevated at 158/80.  With his previous history of grade 2 diastolic dysfunction I elected to add Spironolactone 12.5 mg to his regimen. Remotely I had placed him on hydralazine which he did not tolerate.   He was evaluated by me in a telemedicine visit in April 2020.  He denied any chest pain or awareness of recurrent arrhythmia.  He states his blood pressure at times is up and down.  He apparently has only been taking the Spironolactone  every other day.  At times his blood pressure can reach 144 systolically.  Pulse 50.  He recently began to exercise more than he had previously and has been walking 3 miles at least 3 days/week.  He underwent a follow-up echo Doppler study in February 2020 which showed mild concentric LVH with EF 55 to 60%.  Diastolic function could not be evaluated due to poor image quality   When I saw him on August 10, 2019  he informed me that his blood pressure was typically  greater than 315 systolically but at times there have been some instances where it was in the 120s.  At times his heart rate was low but he denied any significant dizziness.  His ECG however showed bradycardia at 47 bpm with a junctional rhythm and intermittent P waves.  I recommended follow-up laboratory and suggested he wear a 2-week event monitor or Zio patch.    I saw him in follow-up on October 03, 2019.  At that time, Zio patch data was not yet back.  This ultimately demonstrated an average heart rate at 68 bpm with a minimum of 36 which occurred at 5:37 AM and fastest sinus rate at 149 bpm.  Was a single episode of 5 beat run of nonsustained VT and there were several episodes of SVT.  He was evaluated in the emergency room on November 06, 2019 with dizziness apparently after eating a brownie at home which was laced with CBD/THC.  Head CT was normal.  He saw Isaac Laud May in follow-up prior to undergoing spermatic cord surgery.  Eliquis was held for the surgery and he tolerated surgery well.  He was last evaluated by Fabian Sharp on January 10, 2020.  At that time no medication adjustment was made.  His blood pressure was stable on amlodipine 10 mg irbesartan 300 mg and spironolactone 12.5 mg.  I evaluated him on March 13, 2020 and at that time he denied any recurrent chest pain.  He denies any chest pain.  He does not take his medicine altogether and typically takes amlodipine 10 mg in the latter part of the morning, doxazosin 8 mg at bedtime,  irbesartan 300 mg in the morning, minoxidil 2.5 mg and spironolactone 12.5 mg in the morning.   This morning he did not take his morning medications.  He typically goes to bed at 11 PA and often wakes up at 3 AM to urinate.  He is often up the good between 8 and 9 AM.  During that evaluation, his blood pressure was elevated and I recommended he take his medicines in the morning but switch amlodipine to 10 mg at bedtime which should provide him with early morning coverage.  He had not had a recent echo Doppler and scheduled him for follow-up evaluation.  The echo Doppler study was done on June 12, 2021 which showed an EF of 55 to 60%.  There was moderate LVH and grade 1 diastolic dysfunction.  Left atrium was moderately dilated.    I last saw him on August 19, 2020 at which time he denied any chest pain.  However he continued to experience occasional episodes of lightheadedness and at times he noted pulse irregularity., He denies shortness of breath.  He denied any significant orthostatic symptoms.  He continues to be on amlodipine 10 mg, irbesartan 300 mg, minoxidil 5 mg, and spironolactone 12.5 mg daily.  He is on Eliquis for anticoagulation and denies bleeding.  His ECG shows sinus bradycardia with periods of short PR as well as junctional rhythm.  I recommended a follow-up 2-week monitor for reassessment.  His blood pressure was stable without orthostatic change.  He wore a Zio patch monitor from February 13 through September 07, 2020 for 13 days and 22 hours.  The predominant rhythm was sinus rhythm at 66 bpm.  His slowest heart rate was sinus bradycardia at 38 bpm which occurred while sleeping and the fastest heart rate was sinus tachycardia at 157 bpm.  He had first-degree AV block.  There were 10 brief episodes of nonsustained VT with the fastest interval lasting 4 beats with a maximum rate of 187 no longest episode lasting 7 beats with an average rate of 112 bpm.  There were frequent short episodes  of SVT totaling 278 with the run with the fastest interval lasting 5 beats at a maximum rate at 197 and the longest episode lasting 19 beats with an average rate at 95.  There were frequent isolated PVCs and occasional isolated PVCs with couplets and rare atrial triplets.  With his bradycardia I did not feel he was a candidate for long-acting beta-blocker therapy on a daily basis but recommended addition of metoprolol tartrate 12.5 to 25 mg on a as needed basis.  Presently, he feels well.  He does experience low back discomfort after falling and skipping a step.  He is unaware of any recurrent palpitations.  He denies presyncope or syncope.  He sees Dr. Alyson Ingles for excessive urination and continues to follow-up with Dr. [name] for primary care.  He denies any chest pain or shortness of breath.  He presents for reevaluation.  Past Medical History:  Diagnosis Date   1st degree AV block    Atrial fibrillation (HCC)    Diastolic dysfunction 1/01   grade 1   Hypertension    New onset atrial fibrillation ([place]) 06/2015   RBBB    Sinus bradycardia    Stroke ([place])    no defecits, pt denies having a stroke, says [name] diagnosed this    Past Surgical History:  Procedure Laterality Date   COLONOSCOPY     COLONOSCOPY  12/30/2011   Procedure: COLONOSCOPY;  Surgeon: [name], MD;  [facility];  Service: Endoscopy;  Laterality: N/A;  1:45PM   COLONOSCOPY N/A 04/19/2019   Procedure: COLONOSCOPY;  Surgeon: [name], MD;  [facility];  Service: Endoscopy;  Laterality: N/A;  2:00pm   EXCISION MASS LOWER EXTREMETIES Left 05/12/2018   Procedure: Left foot plantar fibroma excision;  Surgeon: [name], MD;  Location: [place];  Service: Orthopedics;  Laterality: Left;   FOOT SURGERY Left    SPERMATOCELECTOMY Right 11/13/2019   Procedure: EXCISION OF SPERMATIC CORD CYST;  Surgeon: [name], MD;  Location: [facility];  Service: Urology;   Laterality: Right;   VASECTOMY  11/13/2019   Procedure: VASECTOMY;  Surgeon: [name], MD;  Location: [facility];  Service: Urology;;    No Known Allergies  Current Outpatient Medications  Medication Sig Dispense Refill   alfuzosin (UROXATRAL) 10 MG 24 hr tablet Take 1 tablet (10 mg total) by mouth daily with breakfast. 90 tablet 3   amLODipine (NORVASC) 10 MG tablet TAKE 1 TABLET(10 MG) BY MOUTH DAILY 90 tablet 1   apixaban (ELIQUIS) 5 MG TABS tablet Take 1 tablet (5 mg total) by mouth 2 (two) times daily. 84 tablet 0   cholecalciferol (VITAMIN D) 25 MCG (1000 UT) tablet Take 1,000 Units by mouth daily.     Coenzyme Q10 (COQ10) 200 MG CAPS Take 200 mg by mouth daily.     doxazosin (CARDURA) 8 MG tablet Take 8 mg by mouth at bedtime.      hydrochlorothiazide (HYDRODIURIL) 25 MG tablet      irbesartan (AVAPRO) 300 MG tablet TAKE 1 TABLET BY MOUTH DAILY 90 tablet 2   metoprolol tartrate (LOPRESSOR) 25 MG tablet Take 1/2 tablet (12.29m) to 1 tablet (254m by mouth as needed for elevated heart rate 180 tablet 3   minoxidil (LONITEN) 2.5 MG tablet Take 5 mg by mouth daily.      mirabegron ER (MYRBETRIQ) 25 MG TB24 tablet Take 1 tablet (25 mg total) by mouth daily. 30 tablet 0   Misc Natural Products (PROSTATE HEALTH PO) Take 1 capsule by mouth daily. Super Beta Prostate     Multiple Vitamin (MULTIVITAMIN WITH MINERALS) TABS tablet Take 1 tablet by mouth daily.     Polyethyl Glycol-Propyl Glycol (LUBRICANT EYE DROPS) 0.4-0.3 % SOLN Place 1 drop into both eyes 3 (three) times daily as needed (dry/irritated eyes.).     saw palmetto 500 MG capsule Take 500 mg by mouth every evening.     spironolactone (ALDACTONE) 25 MG tablet TAKE 1/2 TABLET BY MOUTH EVERY DAY 60 tablet 7   Turmeric 500 MG CAPS Take 500 mg by mouth daily.     vitamin B-12 (CYANOCOBALAMIN) 500 MCG tablet Take 500 mcg by mouth daily.     No current facility-administered medications for this visit.    Social History    Socioeconomic History   Marital status: Married    Spouse name: Not on file   Number of children: 5   Years of education: Not on file   Highest education level: Not on file  Occupational History   Not on file  Tobacco Use   Smoking status: Former    Packs/day: 0.00    Years: 4.00    Pack years: 0.00    Types: Cigarettes    Quit date: 2000    Years since quitting: 22.4   Smokeless tobacco: Never   Tobacco comments:    quit about 40 yrs ago  Vaping Use   Vaping Use: Never used  Substance and Sexual Activity   Alcohol use: Yes    Alcohol/week: 0.0 standard drinks    Comment: 6 pack of beer or less in a week   Drug use: No   Sexual activity: Not on file  Other Topics Concern   Not on file  Social History Narrative   Not on file   Social Determinants of Health   Financial Resource Strain: Not on file  Food Insecurity: Not on file  Transportation Needs: Not on file  Physical Activity: Not on file  Stress: Not  on file  Social Connections: Not on file  Intimate Partner Violence: Not on file    Family History  Problem Relation Age of Onset   Cancer Brother    CVA Mother    Hypertension Father    Colon cancer Neg Hx     ROS General: Negative; No fevers, chills, or night sweats;  HEENT: Negative; No changes in vision or hearing, sinus congestion, difficulty swallowing Pulmonary: Negative; No cough, wheezing, shortness of breath, hemoptysis Cardiovascular: See HPI GI: Negative; No nausea, vomiting, diarrhea, or abdominal pain GU: History of renal cysts; frequent urination Musculoskeletal: Negative; no myalgias, joint pain, or weakness Hematologic/Oncology: Negative; no easy bruising, bleeding Endocrine: Negative; no heat/cold intolerance; no diabetes Neuro: Negative; no changes in balance, headaches Skin: Negative; No rashes or skin lesions Psychiatric: Negative; No behavioral problems, depression Sleep: Negative; No snoring, daytime sleepiness,  hypersomnolence, bruxism, restless legs, hypnogognic hallucinations, no cataplexy Other comprehensive 14 point system review is negative.   PE BP (!) 148/72   Pulse (!) 54   Ht '5\' 10"'  (1.778 m)   Wt 212 lb (96.2 kg)   SpO2 98%   BMI 30.42 kg/m    Repeat blood pressure by me was 122/70  Wt Readings from Last 3 Encounters:  12/19/20 212 lb (96.2 kg)  08/19/20 213 lb 9.6 oz (96.9 kg)  06/27/20 212 lb (96.2 kg)    General: Alert, oriented, no distress.  Skin: normal turgor, no rashes, warm and dry HEENT: Normocephalic, atraumatic. Pupils equal round and reactive to light; sclera anicteric; extraocular muscles intact;  Nose without nasal septal hypertrophy Mouth/Parynx benign; Mallinpatti scale 3 Neck: No JVD, no carotid bruits; normal carotid upstroke Lungs: clear to ausculatation and percussion; no wheezing or rales Chest wall: without tenderness to palpitation Heart: PMI not displaced, RRR, s1 s2 normal, 1/6 systolic murmur, no diastolic murmur, no rubs, gallops, thrills, or heaves Abdomen: soft, nontender; no hepatosplenomehaly, BS+; abdominal aorta nontender and not dilated by palpation. Back: no CVA tenderness Pulses 2+ Musculoskeletal: full range of motion, normal strength, no joint deformities Extremities: no clubbing cyanosis or edema, Homan's sign negative  Neurologic: grossly nonfocal; Cranial nerves grossly wnl Psychologic: Normal mood and affect   ECG (independently read by me): Sinus bradycardia at 54; PAC. 1st degree AV block; RBBB, LAHB, LVH  February 2022 ECG (independently read by me): Sinus bradycardia at 48 bpm with  some junctional beats.  Short PR interval at 96 msec PVC.  Right bundle branch block with repolarization changes.  March 13, 2020 ECG (independently read by me): Sinus Bradycardia at 52, PVC, RBBB  October 03, 2019 ECG (independently read by me): Sinus rhythm at 65 bpm with occasional PVCs.  Right bundle branch block with repolarization, left  anterior hemiblock, LVH by voltage.  August 10, 2019 ECG (independently read by me): Bradycardia at 47 bpm with junctional rhyhm, intermittent P waves  August 09, 2018 ECG (independently read by me): Sinus bradycardia at 65 bpm with sinus arrhythmia, PACs, first-degree AV block with a PR interval '2 6 6 ' ms, right bundle branch block with repolarization changes, left anterior hemiblock.  Septal Q wave V1.  July 21, 2017 ECG (independently read by me): Sinus bradycardia at 47 bpm.  First-degree AV block with a PR interval of 222 ms.  Right bundle branch block with repolarization changes.  PAC.  October 2018 ECG (independently read by me): Sinus bradycardia at 47 bpm. Accelerated AV conduction with a PR interval at 88 ms.  Right bundle-branch block  with repolarization changes.  However, question blocked PACs every other beat.  April 2016 ECG (independently read by me): Sinus bradycardia at 54 bpm with right bundle branch block.  PAC.  QTc interval 479 msec.  PR interval 196 ms.  September 2014 ECG: Sinus bradycardia with first-degree AV block. Heart rate 46 beats per minute. Right bundle branch block, unchanged.  LABS: BMP Latest Ref Rng & Units 11/06/2019 08/15/2019 08/12/2018  Glucose 70 - 99 mg/dL 142(H) 82 95  BUN 8 - 23 mg/dL '19 15 13  ' Creatinine 0.61 - 1.24 mg/dL 1.10 1.01 1.06  BUN/Creat Ratio 10 - 24 - 15 12  Sodium 135 - 145 mmol/L 136 138 140  Potassium 3.5 - 5.1 mmol/L 4.0 4.6 4.7  Chloride 98 - 111 mmol/L 104 100 102  CO2 22 - 32 mmol/L '24 25 23  ' Calcium 8.9 - 10.3 mg/dL 8.5(L) 9.5 9.4   Hepatic Function Latest Ref Rng & Units 11/06/2019 08/15/2019 08/12/2018  Total Protein 6.5 - 8.1 g/dL 7.1 7.0 6.8  Albumin 3.5 - 5.0 g/dL 4.0 4.4 4.2  AST 15 - 41 U/L '26 22 25  ' ALT 0 - 44 U/L '22 14 19  ' Alk Phosphatase 38 - 126 U/L 90 114 99  Total Bilirubin 0.3 - 1.2 mg/dL 0.8 1.1 1.2   CBC Latest Ref Rng & Units 11/06/2019 08/15/2019 08/12/2018  WBC 4.0 - 10.5 K/uL 5.4 4.2 3.6  Hemoglobin 13.0 -  17.0 g/dL 11.8(L) 13.2 12.4(L)  Hematocrit 39.0 - 52.0 % 38.7(L) 41.7 40.3  Platelets 150 - 400 K/uL 231 242 236   Lab Results  Component Value Date   MCV 71.3 (L) 11/06/2019   MCV 70 (L) 08/15/2019   MCV 70 (L) 08/12/2018   Lab Results  Component Value Date   TSH 0.625 11/06/2019   No results found for: HGBA1C   Lipid Panel     Component Value Date/Time   CHOL 165 08/15/2019 1410   TRIG 56 08/15/2019 1410   HDL 73 08/15/2019 1410   CHOLHDL 2.3 08/15/2019 1410   LDLCALC 81 08/15/2019 1410    RADIOLOGY: No results found.  IMPRESSION:  1. Paroxysmal SVT (supraventricular tachycardia) ([place])   2. Essential hypertension   3. Sinus node dysfunction (HCC)   4. Chronic anticoagulation   5. Grade II diastolic dysfunction   6. RBBB     ASSESSMENT AND PLAN: Joseph [name] is 75 year old retired [place] American male who has a history of hypertension and CT evidence for small lacunar infarctions most likely of hypertensive etiology. There also is a remote history of rheumatic fever age 67. He had developed transient symptoms of right arm weakness with numbness in August 2012. An MRI of his head showed scattered small white matter hyper intensities bilaterally with also involvement in the right putamen, bilateral thalamus the also he has some additional changes in the pons, right and left inferior cerebellum.  A prior echo Doppler study has shown restrictive physiology.  His  echo Doppler study in December 2016 showed an EF of 50-55%, LVH, and grade 2 diastolic dysfunction.  There was mild biatrial enlargement.    He has had issues in the past with bradycardia leading to discontinuance of his metoprolol.  When I saw him in August 10, 2019, his ECG showed a junctional rhythm there were periods where there were P waves with short PR interval and other times without P waves.  He was not on any negative chronotropic medications.  At that time laboratory  revealed a creatinine of  1.01 with  potassium 4.6.   TSH was normal at 1.28.  Lipid studies were stable with total cholesterol 165 triglycerides 56 HDL 73 LDL 81.  Magnesium was 1.9.  He was not anemic but had microcytic indices with an MCV of 70 which is consistent with his known thalassemia.  On subsequent ECG there was  resolution of his previous junctional rhythm.  On remote Zio patch monitoring he was felt to have sinus node dysfunction but also had periods of short-lived SVT with interval lasting 4 beats with a maximum rate at 190 and the longest interval lasting 21.3 seconds with an average rate of 100 bpm.  There were isolated PACs, rare atrial couplets or triplets.  There was an episode of ventricular bigeminy.  I reviewed his most recent Zio patch monitor from February 13 through 10/05/2020 with him in detail.  Presently he feels well.  I had recommended as needed metoprolol tartrate rather than daily beta-blocker therapy with his underlying bradycardia.  His blood pressure today is controlled on amlodipine 10 mg, doxazosin 8 mg, irbesartan 300 mg, minoxidil 5 mg daily and spironolactone 12.5 mg.  He also has a prescription for HCTZ which he occasionally takes.  He continues to be on Eliquis for anticoagulation.  He is scheduled to undergo CT angio of his abdomen and pelvis at any pain in June.  He will be following up with Dr. [name] who will be rechecking laboratory.  I will see him in 6 months for cardiology reevaluation or sooner as needed.   Troy Sine, MD, [facility]  12/21/2020 2:04 PM

## 2020-12-19 NOTE — Patient Instructions (Signed)
Medication Instructions:  Your physician recommends that you continue on your current medications as directed. Please refer to the Current Medication list given to you today.  *If you need a refill on your cardiac medications before your next appointment, please call your pharmacy*   Lab Work: None ordered.    Testing/Procedures: None ordered.    Follow-Up: At [facility], you and your health needs are our priority.  As part of our continuing mission to provide you with exceptional heart care, we have created designated Provider Care Teams.  These Care Teams include your primary Cardiologist (physician) and Advanced Practice Providers (APPs -  Physician Assistants and Nurse Practitioners) who all work together to provide you with the care you need, when you need it.  We recommend signing up for the patient portal called "MyChart".  Sign up information is provided on this After Visit Summary.  MyChart is used to connect with patients for Virtual Visits (Telemedicine).  Patients are able to view lab/test results, encounter notes, upcoming appointments, etc.  Non-urgent messages can be sent to your provider as well.   To learn more about what you can do with MyChart, go to [email or web address].    Your next appointment:   6 month(s)  The format for your next appointment:   In Person  Provider:   Thomas Kelly, MD     

## 2020-12-21 ENCOUNTER — Encounter: Payer: Self-pay | Admitting: Cardiovascular Disease

## 2020-12-26 ENCOUNTER — Ambulatory Visit: Payer: Medicare PPO | Admitting: Urology

## 2020-12-27 ENCOUNTER — Other Ambulatory Visit: Payer: Self-pay

## 2020-12-27 ENCOUNTER — Ambulatory Visit (INDEPENDENT_AMBULATORY_CARE_PROVIDER_SITE_OTHER): Payer: Medicare PPO | Admitting: Urology

## 2020-12-27 ENCOUNTER — Encounter: Payer: Self-pay | Admitting: Urology

## 2020-12-27 VITALS — BP 193/77 | HR 66 | Temp 99.0°F | Resp 14 | Ht 70.0 in | Wt 210.0 lb

## 2020-12-27 DIAGNOSIS — N138 Other obstructive and reflux uropathy: Secondary | ICD-10-CM

## 2020-12-27 DIAGNOSIS — R351 Nocturia: Secondary | ICD-10-CM

## 2020-12-27 DIAGNOSIS — N401 Enlarged prostate with lower urinary tract symptoms: Secondary | ICD-10-CM

## 2020-12-27 DIAGNOSIS — N3281 Overactive bladder: Secondary | ICD-10-CM | POA: Diagnosis not present

## 2020-12-27 LAB — URINALYSIS, ROUTINE W REFLEX MICROSCOPIC
Bilirubin, UA: NEGATIVE
Glucose, UA: NEGATIVE
Ketones, UA: NEGATIVE
Leukocytes,UA: NEGATIVE
Nitrite, UA: NEGATIVE
Protein,UA: NEGATIVE
RBC, UA: NEGATIVE
Specific Gravity, UA: 1.025 (ref 1.005–1.030)
Urobilinogen, Ur: 0.2 mg/dL (ref 0.2–1.0)
pH, UA: 5.5 (ref 5.0–7.5)

## 2020-12-27 LAB — BLADDER SCAN AMB NON-IMAGING: Scan Result: 4

## 2020-12-27 MED ORDER — ALFUZOSIN HCL ER 10 MG PO TB24: 10.0000 mg | ORAL_TABLET | Freq: Every day | ORAL | 3 refills | Status: DC

## 2020-12-27 NOTE — Patient Instructions (Signed)

## 2020-12-27 NOTE — Progress Notes (Signed)
post void residual =4 

## 2020-12-27 NOTE — Progress Notes (Signed)
Urological Symptom Review  Patient is experiencing the following symptoms: Frequent urination Hard to postpone urination Get up at night to urinate Leakage of urine   Review of Systems  Gastrointestinal (upper)  : Negative for upper GI symptoms  Gastrointestinal (lower) : Negative for lower GI symptoms  Constitutional : Negative for symptoms  Skin: Negative for skin symptoms  Eyes: Negative for eye symptoms  Ear/Nose/Throat : Negative for Ear/Nose/Throat symptoms  Hematologic/Lymphatic: Negative for Hematologic/Lymphatic symptoms  Cardiovascular : Negative for cardiovascular symptoms  Respiratory : Some SOB  Endocrine: Negative for endocrine symptoms  Musculoskeletal: Back pain  Neurological: Negative for neurological symptoms  Psychologic: Negative for psychiatric symptoms

## 2020-12-27 NOTE — Progress Notes (Signed)
12/27/2020 10:47 AM   Joseph [name] May 29, 1946 [id number]  Referring provider: Lemmie Evens, MD [place],  [place] [address]  Followup BPH   HPI: [name] is 75yo here for followup for BPH and nocturia. IPSS 17 QOL 4. Nocturia 2x. Urgency is worse since last visit. He is on uroxatral 10mg  qhs. He has urgency and urge incontinence multiple times per day. Urinary frequency every 1-2 hours.    PMH: Past Medical History:  Diagnosis Date   1st degree AV block    Atrial fibrillation (HCC)    Diastolic dysfunction 1/82   grade 1   Hypertension    New onset atrial fibrillation ([place]) 06/2015   RBBB    Sinus bradycardia    Stroke ([place])    no defecits, pt denies having a stroke, says Dr Claiborne [name] diagnosed this    Surgical History: Past Surgical History:  Procedure Laterality Date   COLONOSCOPY     COLONOSCOPY  12/30/2011   Procedure: COLONOSCOPY;  Surgeon: Joseph Dolin, MD;  [facility];  Service: Endoscopy;  Laterality: N/A;  1:45PM   COLONOSCOPY N/A 04/19/2019   Procedure: COLONOSCOPY;  Surgeon: Joseph Dolin, MD;  [facility];  Service: Endoscopy;  Laterality: N/A;  2:00pm   EXCISION MASS LOWER EXTREMETIES Left 05/12/2018   Procedure: Left foot plantar fibroma excision;  Surgeon: Joseph Simmer, MD;  Location: [facility];  Service: Orthopedics;  Laterality: Left;   FOOT SURGERY Left    SPERMATOCELECTOMY Right 11/13/2019   Procedure: EXCISION OF SPERMATIC CORD CYST;  Surgeon: Joseph Gustin, MD;  [facility];  Service: Urology;  Laterality: Right;   VASECTOMY  11/13/2019   Procedure: VASECTOMY;  Surgeon: Joseph Gustin, MD;  [facility];  Service: Urology;;    Home Medications:  Allergies as of 12/27/2020   No Known Allergies      Medication List        Accurate as of December 27, 2020 10:47 AM. If you have any questions, ask your nurse or doctor.          alfuzosin 10 MG 24 hr  tablet Commonly known as: UROXATRAL Take 1 tablet (10 mg total) by mouth daily with breakfast.   amLODipine 10 MG tablet Commonly known as: NORVASC TAKE 1 TABLET(10 MG) BY MOUTH DAILY   apixaban 5 MG Tabs tablet Commonly known as: Eliquis Take 1 tablet (5 mg total) by mouth 2 (two) times daily.   cholecalciferol 25 MCG (1000 UNIT) tablet Commonly known as: VITAMIN D Take 1,000 Units by mouth daily.   CoQ10 200 MG Caps Take 200 mg by mouth daily.   doxazosin 8 MG tablet Commonly known as: CARDURA Take 8 mg by mouth at bedtime.   hydrochlorothiazide 25 MG tablet Commonly known as: HYDRODIURIL   irbesartan 300 MG tablet Commonly known as: AVAPRO TAKE 1 TABLET BY MOUTH DAILY   Lubricant Eye Drops 0.4-0.3 % Soln Generic drug: Polyethyl Glycol-Propyl Glycol Place 1 drop into both eyes 3 (three) times daily as needed (dry/irritated eyes.).   metoprolol tartrate 25 MG tablet Commonly known as: LOPRESSOR Take 1/2 tablet (12.5mg ) to 1 tablet (25mg ) by mouth as needed for elevated heart rate   minoxidil 2.5 MG tablet Commonly known as: LONITEN Take 5 mg by mouth daily.   mirabegron ER 25 MG Tb24 tablet Commonly known as: MYRBETRIQ Take 1 tablet (25 mg total) by mouth daily.   multivitamin with minerals Tabs tablet  Take 1 tablet by mouth daily.   PROSTATE HEALTH PO Take 1 capsule by mouth daily. Super Beta Prostate   saw palmetto 500 MG capsule Take 500 mg by mouth every evening.   spironolactone 25 MG tablet Commonly known as: ALDACTONE TAKE 1/2 TABLET BY MOUTH EVERY DAY   Turmeric 500 MG Caps Take 500 mg by mouth daily.   vitamin B-12 500 MCG tablet Commonly known as: CYANOCOBALAMIN Take 500 mcg by mouth daily.        Allergies: No Known Allergies  Family History: Family History  Problem Relation Age of Onset   Cancer Brother    CVA Mother    Hypertension Father    Colon cancer Neg Hx     Social History:  reports that he quit smoking about 22  years ago. His smoking use included cigarettes. He has never used smokeless tobacco. He reports current alcohol use. He reports that he does not use drugs.  ROS: All other review of systems were reviewed and are negative except what is noted above in HPI  Physical Exam: BP (!) 193/77   Pulse 66   Temp 99 F (37.2 C) (Oral)   Resp 14   Ht 5\' 10"  (1.778 m)   Wt 210 lb (95.3 kg)   BMI 30.13 kg/m   Constitutional:  Alert and oriented, No acute distress. HEENT: [place] AT, moist mucus membranes.  Trachea midline, no masses. Cardiovascular: No clubbing, cyanosis, or edema. Respiratory: Normal respiratory effort, no increased work of breathing. GI: Abdomen is soft, nontender, nondistended, no abdominal masses GU: No CVA tenderness.  Lymph: No cervical or inguinal lymphadenopathy. Skin: No rashes, bruises or suspicious lesions. Neurologic: Grossly intact, no focal deficits, moving all 4 extremities. Psychiatric: Normal mood and affect.  Laboratory Data: Lab Results  Component Value Date   WBC 5.4 11/06/2019   HGB 11.8 (L) 11/06/2019   HCT 38.7 (L) 11/06/2019   MCV 71.3 (L) 11/06/2019   PLT 231 11/06/2019    Lab Results  Component Value Date   CREATININE 1.10 11/06/2019    No results found for: PSA  No results found for: TESTOSTERONE  No results found for: HGBA1C  Urinalysis    Component Value Date/Time   COLORURINE YELLOW 11/06/2019 2130   APPEARANCEUR Clear 06/27/2020 1137   LABSPEC 1.016 11/06/2019 2130   PHURINE 5.0 11/06/2019 2130   GLUCOSEU Negative 06/27/2020 1137   HGBUR NEGATIVE 11/06/2019 2130   BILIRUBINUR Negative 06/27/2020 [place] 11/06/2019 2130   PROTEINUR Negative 06/27/2020 [place] 11/06/2019 2130   NITRITE Negative 06/27/2020 1137   NITRITE NEGATIVE 11/06/2019 2130   LEUKOCYTESUR Negative 06/27/2020 1137   LEUKOCYTESUR NEGATIVE 11/06/2019 2130    Lab Results  Component Value Date   LABMICR Comment 06/27/2020     Pertinent Imaging:  No results found for this or any previous visit.  No results found for this or any previous visit.  No results found for this or any previous visit.  No results found for this or any previous visit.  No results found for this or any previous visit.  No results found for this or any previous visit.  No results found for this or any previous visit.  No results found for this or any previous visit.   Assessment & Plan:    1. Nocturia -Continue uroxatral 10mg  qhs - Urinalysis, Routine w reflex microscopic  2. Benign prostatic hyperplasia with urinary obstruction -uroxatrla 10mg  qhs  3. Urge incontinence -  Mirabegorn 50mg  daily   No follow-ups on file.  Nicolette Bang, MD  [facility] [place]

## 2021-01-03 ENCOUNTER — Other Ambulatory Visit: Payer: Self-pay

## 2021-01-03 ENCOUNTER — Ambulatory Visit (HOSPITAL_COMMUNITY)
Admission: RE | Admit: 2021-01-03 | Discharge: 2021-01-03 | Disposition: A | Discharge status: Home / Self Care | Payer: Medicare PPO | Source: Ambulatory Visit | Attending: Family Medicine | Admitting: Family Medicine

## 2021-01-03 DIAGNOSIS — I10 Essential (primary) hypertension: Secondary | ICD-10-CM | POA: Insufficient documentation

## 2021-01-03 LAB — POCT I-STAT CREATININE: Creatinine, Ser: 1.2 mg/dL (ref 0.61–1.24)

## 2021-01-03 MED ORDER — IOHEXOL 350 MG/ML SOLN
100.0000 mL | Freq: Once | INTRAVENOUS | Status: AC | PRN
  Administered 2021-01-03: 100 mL via INTRAVENOUS

## 2021-02-03 ENCOUNTER — Other Ambulatory Visit (HOSPITAL_COMMUNITY): Payer: Self-pay | Admitting: Family Medicine

## 2021-02-03 ENCOUNTER — Other Ambulatory Visit: Payer: Self-pay | Admitting: Family Medicine

## 2021-02-03 DIAGNOSIS — R479 Unspecified speech disturbances: Secondary | ICD-10-CM

## 2021-02-07 ENCOUNTER — Ambulatory Visit: Payer: Medicare PPO | Admitting: Urology

## 2021-02-13 ENCOUNTER — Ambulatory Visit (HOSPITAL_COMMUNITY)
Admission: RE | Admit: 2021-02-13 | Discharge: 2021-02-13 | Disposition: A | Discharge status: Home / Self Care | Payer: Medicare PPO | Source: Ambulatory Visit | Attending: Family Medicine | Admitting: Family Medicine

## 2021-02-13 ENCOUNTER — Other Ambulatory Visit: Payer: Self-pay

## 2021-02-13 DIAGNOSIS — R479 Unspecified speech disturbances: Secondary | ICD-10-CM | POA: Insufficient documentation

## 2021-02-13 MED ORDER — GADOBUTROL 1 MMOL/ML IV SOLN
10.0000 mL | Freq: Once | INTRAVENOUS | Status: AC | PRN
  Administered 2021-02-13: 10 mL via INTRAVENOUS

## 2021-03-11 ENCOUNTER — Other Ambulatory Visit: Payer: Self-pay | Admitting: Cardiovascular Disease

## 2021-03-19 ENCOUNTER — Telehealth: Payer: Self-pay | Admitting: Cardiovascular Disease

## 2021-03-19 NOTE — Telephone Encounter (Signed)
Dr. [name] wanted to talk to Dr. Claiborne Billings about this patient.  Please have Dr. Claiborne Billings call Dr. [name]. The number provided is Dr. Vickey Sages personal cell phone

## 2021-03-20 NOTE — Telephone Encounter (Signed)
Dr.Kelly and myself attempted to contact Dr.Knowlton, unable to reach him- left a message to call back but did make him aware Dr.Kelly was going out of town after today.

## 2021-04-09 ENCOUNTER — Ambulatory Visit: Payer: Medicare PPO | Admitting: Urology

## 2021-04-09 ENCOUNTER — Telehealth: Payer: Self-pay

## 2021-04-09 NOTE — Telephone Encounter (Signed)
Patient forgot his appt today with Dr. Alyson Ingles. He was rescheduled to next available (Nov.)  He wants to know if he could be seen sooner.  Please advise.  Thanks, Helene Kelp

## 2021-04-11 NOTE — Telephone Encounter (Signed)
I don't see any open appts. You could place him on a wait list if someone cancels.

## 2021-04-14 ENCOUNTER — Telehealth: Payer: Self-pay

## 2021-04-14 NOTE — Telephone Encounter (Signed)
Patient left a Voice Mail:   Needing a refill on mirabegron ER (MYRBETRIQ) 25 MG TB24 tablet  Thanks, Helene Kelp

## 2021-04-15 ENCOUNTER — Other Ambulatory Visit: Payer: Self-pay

## 2021-04-15 MED ORDER — MIRABEGRON ER 50 MG PO TB24
50.0000 mg | ORAL_TABLET | Freq: Every day | ORAL | 1 refills | Status: DC
Start: 2021-04-15 — End: 2021-06-30

## 2021-04-15 NOTE — Telephone Encounter (Signed)
Rx sent 

## 2021-04-27 ENCOUNTER — Other Ambulatory Visit: Payer: Self-pay | Admitting: Cardiovascular Disease

## 2021-05-10 IMAGING — DX DG SHOULDER 2+V*R*
3 series · 3 of 3 positions shown · non-contrast
Comparison: None.

CLINICAL DATA: Right shoulder pain

EXAM:
RIGHT SHOULDER - 2+ VIEW

[shoulder grashey]
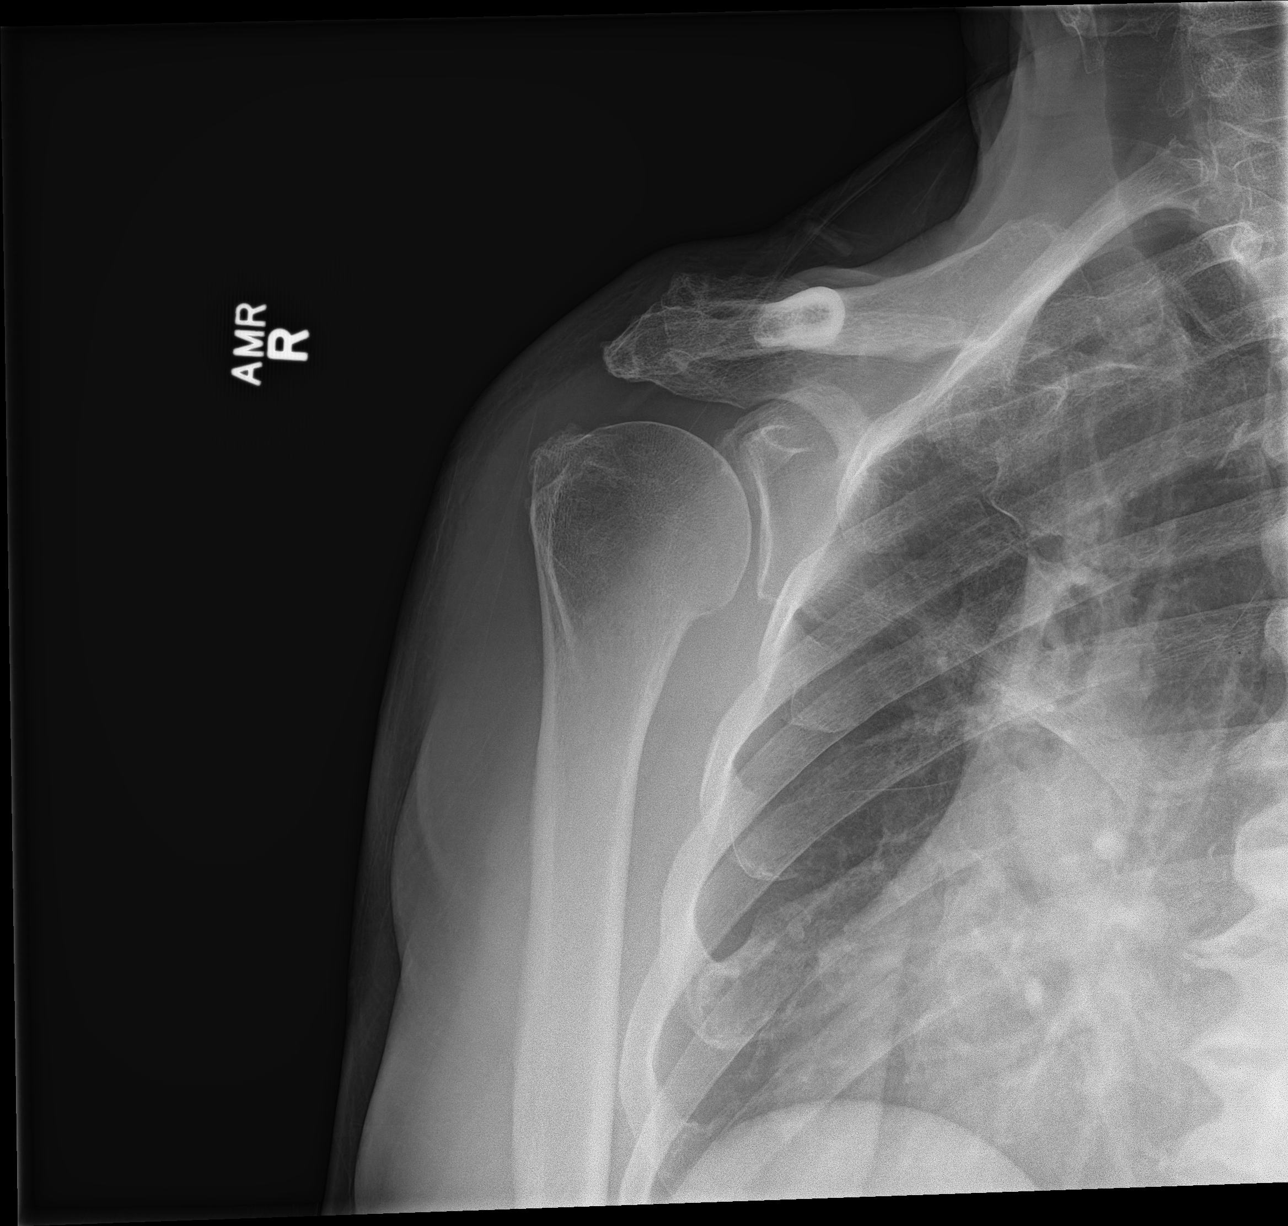

[shoulder y view]
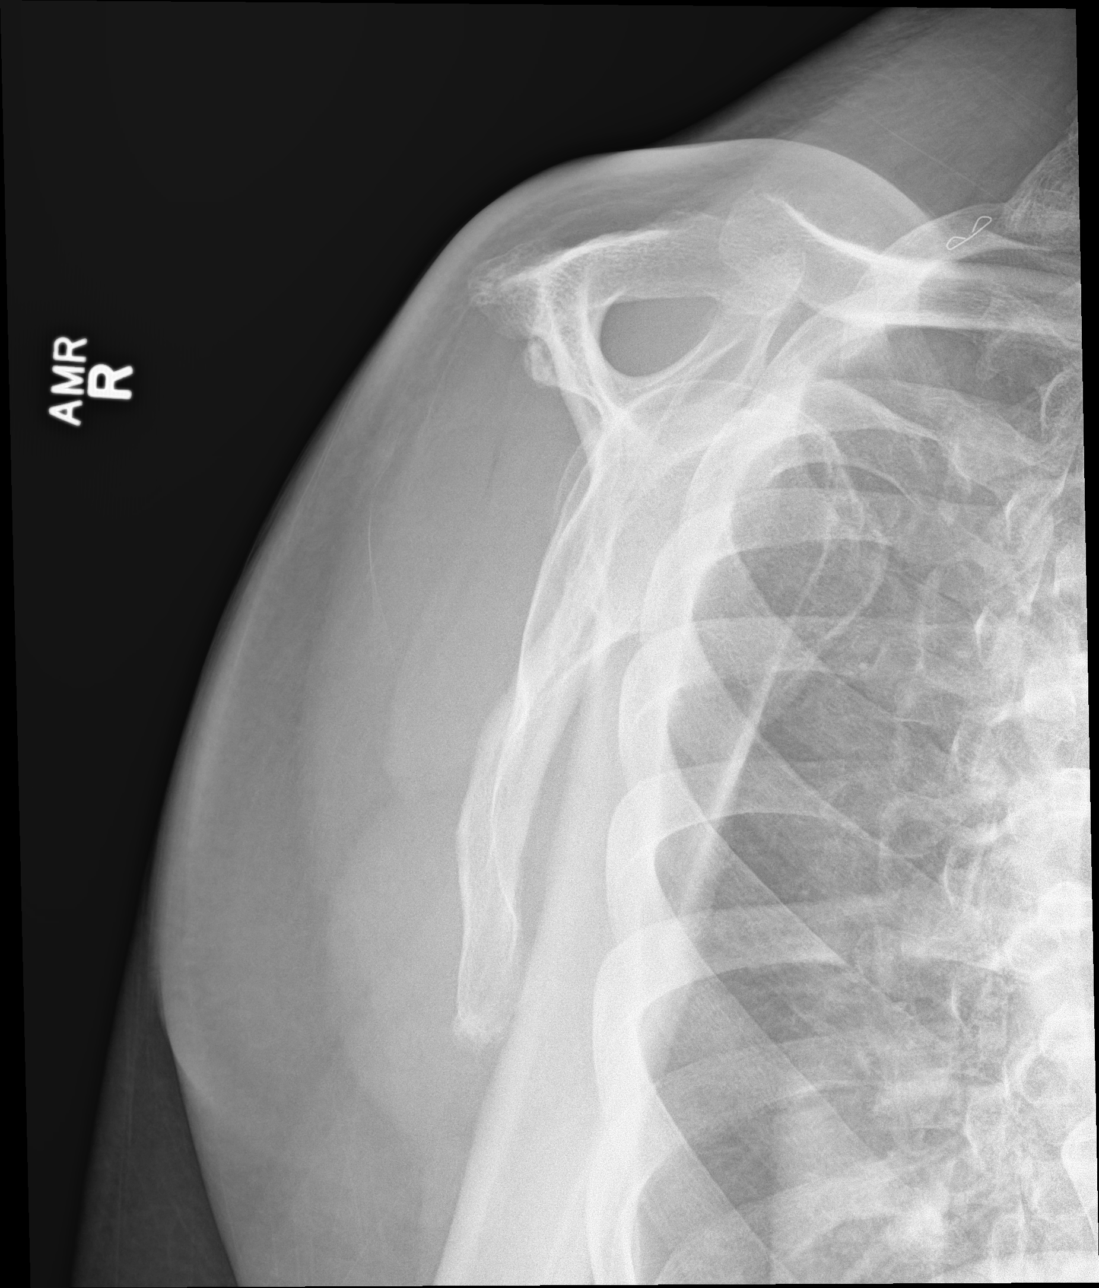

[shoulder axillary]
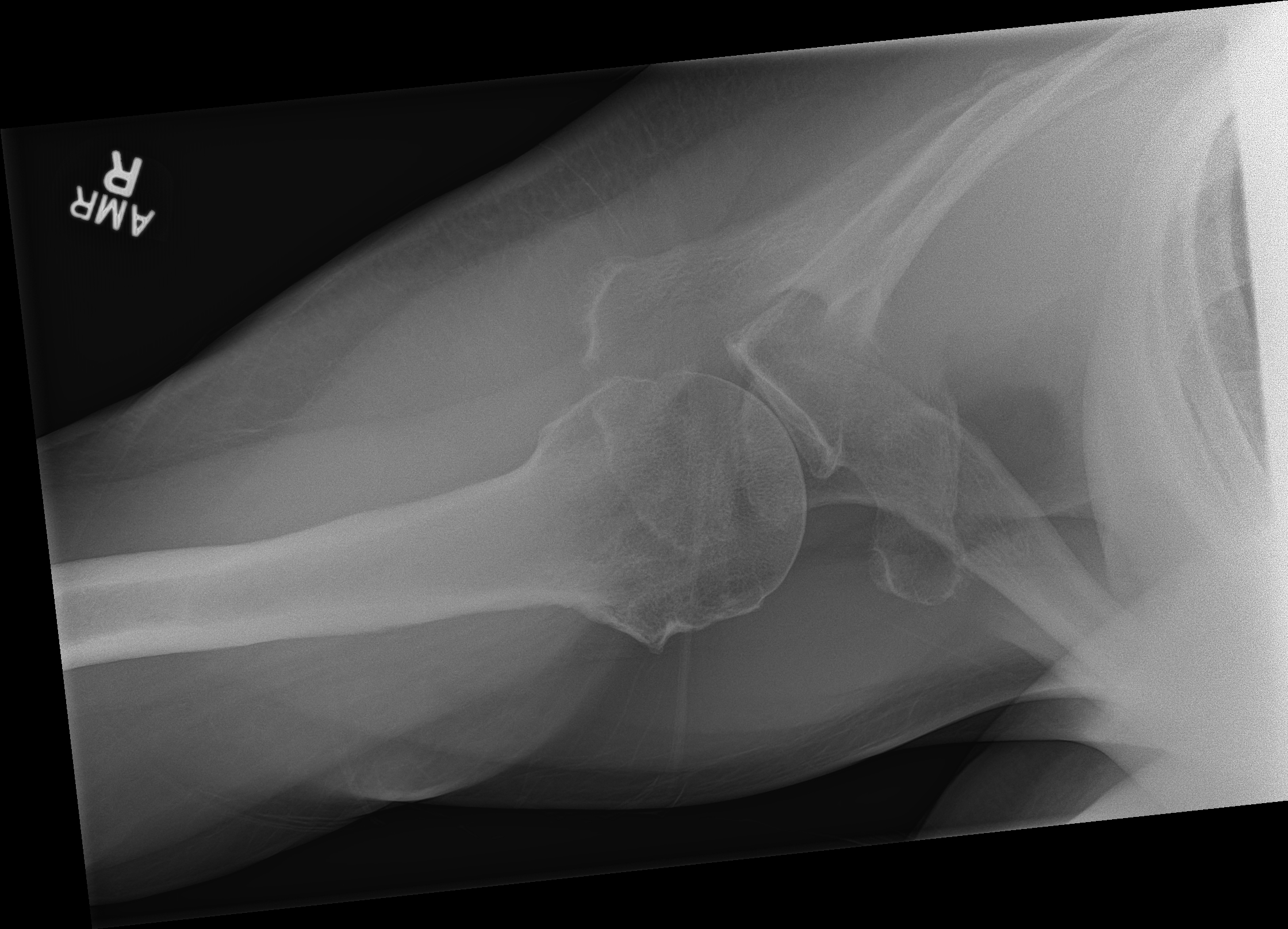

[3 of 3 positions shown; findings below may reference images not displayed]

FINDINGS: No fracture or malalignment. AC joint degenerative change. Mild
calcific tendinitis
IMPRESSION: 1. No acute osseous abnormality
2. Mild calcific tendinitis

## 2021-05-10 IMAGING — DX DG CERVICAL SPINE COMPLETE 4+V
5 series · 5 of 5 positions shown · non-contrast
Comparison: 03/03/2011

CLINICAL DATA: Shoulder pain

EXAM:
CERVICAL SPINE - COMPLETE 4+ VIEW

[c-spine lat (1 of 2)]
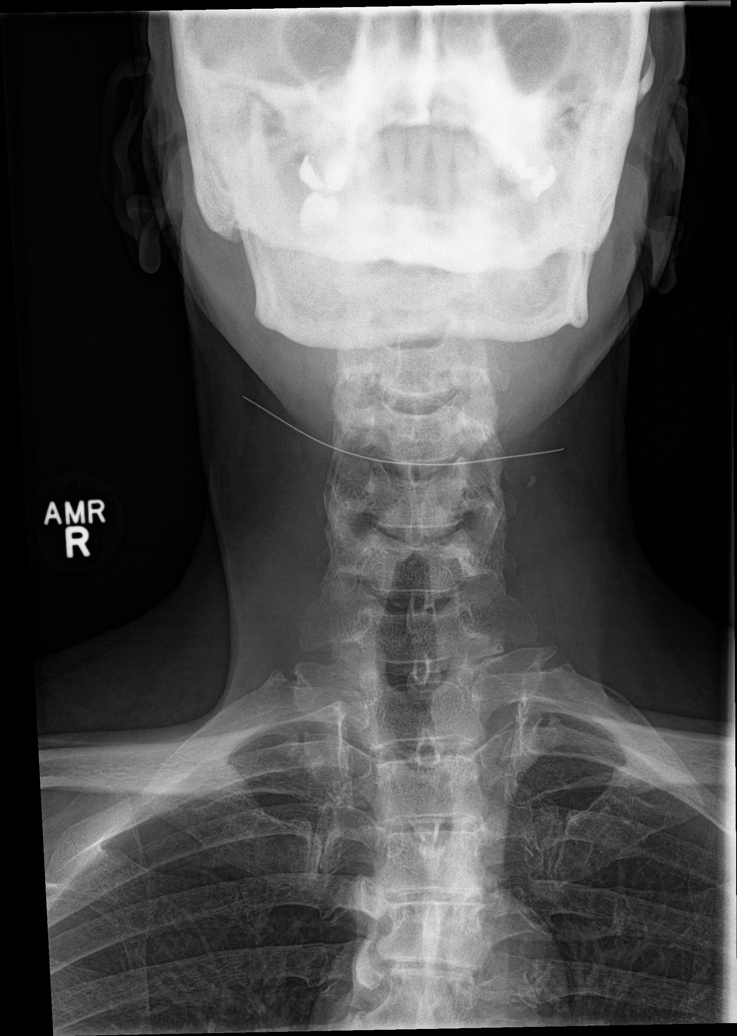

[c-spine obl (1 of 2)]
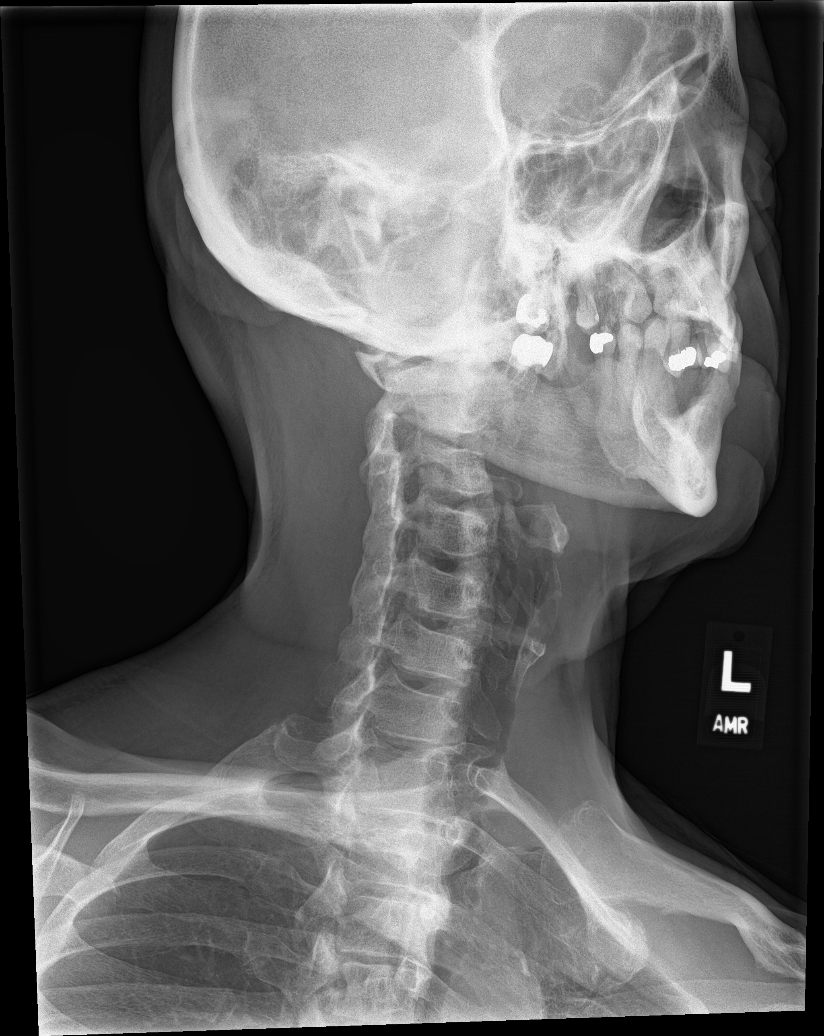

[c-spine obl (2 of 2)]
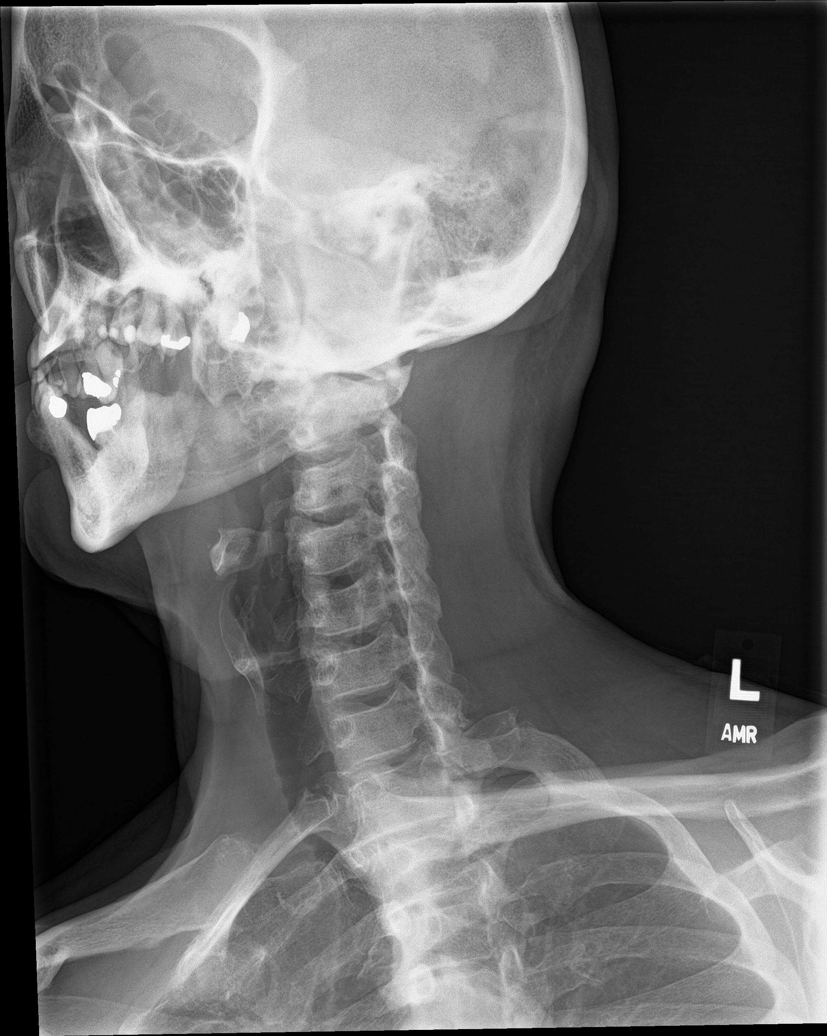

[c-spine open mouth]
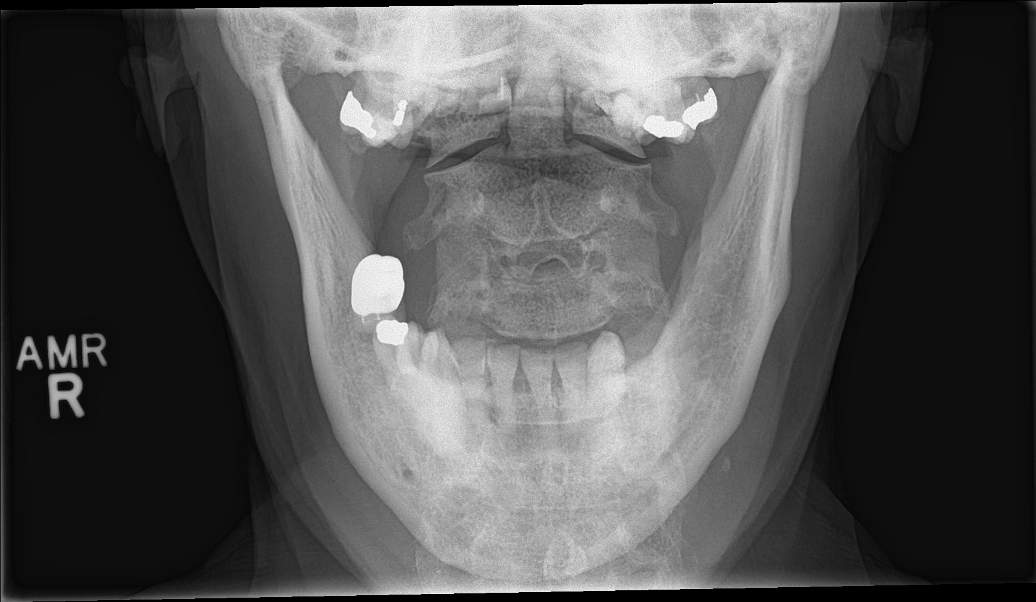

[c-spine lat (2 of 2)]
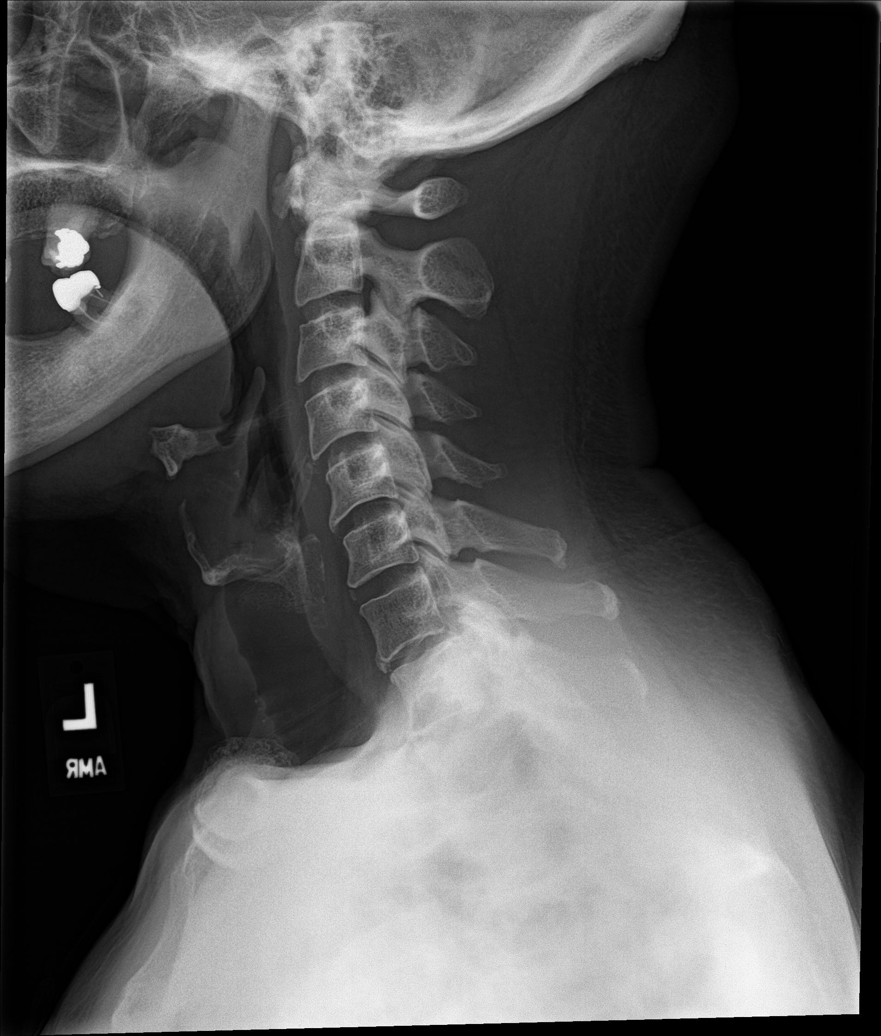

[5 of 5 positions shown; findings below may reference images not displayed]

FINDINGS: Alignment within normal limits. Vertebral body heights are normal.
Mild degenerative change C5-C6. Minimal anterior osteophytes C5
through T1. Normal prevertebral soft tissue thickness. Dens and
lateral masses are unremarkable.
IMPRESSION: Mild degenerative changes.  No acute osseous abnormality.

## 2021-05-21 ENCOUNTER — Other Ambulatory Visit: Payer: Self-pay

## 2021-05-21 ENCOUNTER — Encounter: Payer: Self-pay | Admitting: Urology

## 2021-05-21 ENCOUNTER — Ambulatory Visit (INDEPENDENT_AMBULATORY_CARE_PROVIDER_SITE_OTHER): Payer: Medicare PPO | Admitting: Urology

## 2021-05-21 ENCOUNTER — Other Ambulatory Visit: Payer: Self-pay | Admitting: Urology

## 2021-05-21 VITALS — BP 181/76 | HR 76

## 2021-05-21 DIAGNOSIS — N3281 Overactive bladder: Secondary | ICD-10-CM

## 2021-05-21 DIAGNOSIS — N401 Enlarged prostate with lower urinary tract symptoms: Secondary | ICD-10-CM | POA: Diagnosis not present

## 2021-05-21 DIAGNOSIS — R351 Nocturia: Secondary | ICD-10-CM

## 2021-05-21 DIAGNOSIS — N138 Other obstructive and reflux uropathy: Secondary | ICD-10-CM

## 2021-05-21 DIAGNOSIS — N281 Cyst of kidney, acquired: Secondary | ICD-10-CM

## 2021-05-21 LAB — URINALYSIS, ROUTINE W REFLEX MICROSCOPIC
Bilirubin, UA: NEGATIVE
Glucose, UA: NEGATIVE
Ketones, UA: NEGATIVE
Leukocytes,UA: NEGATIVE
Nitrite, UA: NEGATIVE
Protein,UA: NEGATIVE
RBC, UA: NEGATIVE
Specific Gravity, UA: 1.015 (ref 1.005–1.030)
Urobilinogen, Ur: 0.2 mg/dL (ref 0.2–1.0)
pH, UA: 7 (ref 5.0–7.5)

## 2021-05-21 LAB — BLADDER SCAN AMB NON-IMAGING: Scan Result: 1

## 2021-05-21 MED ORDER — SOLIFENACIN SUCCINATE 5 MG PO TABS: 5.0000 mg | ORAL_TABLET | Freq: Every day | ORAL | 1 refills | Status: DC

## 2021-05-21 NOTE — Progress Notes (Signed)
05/21/2021 2:43 PM   Joseph [name] 29-Feb-1946 [id number]  Referring provider: Lemmie Evens, MD [place],  [place] [address]  Followup BPH and nocturia  HPI: [name] is a 75yo here for followup for BPh with nocturia. Last visit we added mirabegron to his regiment which has failed to improve his urinary urgency, frequency and nocturia. IPSS 16 QOL 4. He has urge incontinence 1-2x per day. Urine stream fair. No straining to urinate. Nocturia 3x. No other complaints today. PVR 0cc. UA normal   PMH: Past Medical History:  Diagnosis Date   1st degree AV block    Atrial fibrillation (HCC)    Diastolic dysfunction 4/12   grade 1   Hypertension    New onset atrial fibrillation ([place]) 06/2015   RBBB    Sinus bradycardia    Stroke ([place])    no defecits, pt denies having a stroke, says Dr Joseph [name] diagnosed this    Surgical History: Past Surgical History:  Procedure Laterality Date   COLONOSCOPY     COLONOSCOPY  12/30/2011   Procedure: COLONOSCOPY;  Surgeon: [name], MD;  [facility];  Service: Endoscopy;  Laterality: N/A;  1:45PM   COLONOSCOPY N/A 04/19/2019   Procedure: COLONOSCOPY;  Surgeon: [name], MD;  [facility];  Service: Endoscopy;  Laterality: N/A;  2:00pm   EXCISION MASS LOWER EXTREMETIES Left 05/12/2018   Procedure: Left foot plantar fibroma excision;  Surgeon: [name], MD;  Location: [facility];  Service: Orthopedics;  Laterality: Left;   FOOT SURGERY Left    SPERMATOCELECTOMY Right 11/13/2019   Procedure: EXCISION OF SPERMATIC CORD CYST;  Surgeon: [name], MD;  [facility];  Service: Urology;  Laterality: Right;   VASECTOMY  11/13/2019   Procedure: VASECTOMY;  Surgeon: [name], MD;  [facility];  Service: Urology;;    Home Medications:  Allergies as of 05/21/2021   No Known Allergies      Medication List        Accurate as of May 21, 2021  2:43 PM.  If you have any questions, ask your nurse or doctor.          alfuzosin 10 MG 24 hr tablet Commonly known as: UROXATRAL Take 1 tablet (10 mg total) by mouth daily with breakfast.   amLODipine 10 MG tablet Commonly known as: NORVASC TAKE 1 TABLET(10 MG) BY MOUTH DAILY   apixaban 5 MG Tabs tablet Commonly known as: Eliquis Take 1 tablet (5 mg total) by mouth 2 (two) times daily.   cholecalciferol 25 MCG (1000 UNIT) tablet Commonly known as: VITAMIN D Take 1,000 Units by mouth daily.   CoQ10 200 MG Caps Take 200 mg by mouth daily.   doxazosin 8 MG tablet Commonly known as: CARDURA Take 8 mg by mouth at bedtime.   hydrochlorothiazide 25 MG tablet Commonly known as: HYDRODIURIL   irbesartan 300 MG tablet Commonly known as: AVAPRO TAKE 1 TABLET BY MOUTH DAILY   Lubricant Eye Drops 0.4-0.3 % Soln Generic drug: Polyethyl Glycol-Propyl Glycol Place 1 drop into both eyes 3 (three) times daily as needed (dry/irritated eyes.).   metoprolol tartrate 25 MG tablet Commonly known as: LOPRESSOR Take 1/2 tablet (12.5mg ) to 1 tablet (25mg ) by mouth as needed for elevated heart rate   minoxidil 2.5 MG tablet Commonly known as: LONITEN Take 5 mg by mouth daily.   mirabegron ER 25 MG Tb24 tablet Commonly known as: MYRBETRIQ  Take 1 tablet (25 mg total) by mouth daily.   mirabegron ER 50 MG Tb24 tablet Commonly known as: MYRBETRIQ Take 1 tablet (50 mg total) by mouth daily.   multivitamin with minerals Tabs tablet Take 1 tablet by mouth daily.   PROSTATE HEALTH PO Take 1 capsule by mouth daily. Super Beta Prostate   saw palmetto 500 MG capsule Take 500 mg by mouth every evening.   spironolactone 25 MG tablet Commonly known as: ALDACTONE TAKE 1/2 TABLET BY MOUTH EVERY DAY   Turmeric 500 MG Caps Take 500 mg by mouth daily.   vitamin B-12 500 MCG tablet Commonly known as: CYANOCOBALAMIN Take 500 mcg by mouth daily.        Allergies: No Known Allergies  Family  History: Family History  Problem Relation Age of Onset   Cancer Brother    CVA Mother    Hypertension Father    Colon cancer Neg Hx     Social History:  reports that he quit smoking about 22 years ago. His smoking use included cigarettes. He has never used smokeless tobacco. He reports current alcohol use. He reports that he does not use drugs.  ROS: All other review of systems were reviewed and are negative except what is noted above in HPI  Physical Exam: BP (!) 181/76   Pulse 76   Constitutional:  Alert and oriented, No acute distress. HEENT: [place] AT, moist mucus membranes.  Trachea midline, no masses. Cardiovascular: No clubbing, cyanosis, or edema. Respiratory: Normal respiratory effort, no increased work of breathing. GI: Abdomen is soft, nontender, nondistended, no abdominal masses GU: No CVA tenderness.  Lymph: No cervical or inguinal lymphadenopathy. Skin: No rashes, bruises or suspicious lesions. Neurologic: Grossly intact, no focal deficits, moving all 4 extremities. Psychiatric: Normal mood and affect.  Laboratory Data: Lab Results  Component Value Date   WBC 5.4 11/06/2019   HGB 11.8 (L) 11/06/2019   HCT 38.7 (L) 11/06/2019   MCV 71.3 (L) 11/06/2019   PLT 231 11/06/2019    Lab Results  Component Value Date   CREATININE 1.20 01/03/2021    No results found for: PSA  No results found for: TESTOSTERONE  No results found for: HGBA1C  Urinalysis    Component Value Date/Time   COLORURINE YELLOW 11/06/2019 2130   APPEARANCEUR Clear 12/27/2020 1024   LABSPEC 1.016 11/06/2019 2130   PHURINE 5.0 11/06/2019 2130   GLUCOSEU Negative 12/27/2020 1024   HGBUR NEGATIVE 11/06/2019 2130   BILIRUBINUR Negative 12/27/2020 1024   KETONESUR NEGATIVE 11/06/2019 2130   PROTEINUR Negative 12/27/2020 1024   PROTEINUR NEGATIVE 11/06/2019 2130   NITRITE Negative 12/27/2020 1024   NITRITE NEGATIVE 11/06/2019 2130   LEUKOCYTESUR Negative 12/27/2020 1024   LEUKOCYTESUR  NEGATIVE 11/06/2019 2130    Lab Results  Component Value Date   LABMICR Comment 06/27/2020    Pertinent Imaging:  No results found for this or any previous visit.  No results found for this or any previous visit.  No results found for this or any previous visit.  No results found for this or any previous visit.  No results found for this or any previous visit.  No results found for this or any previous visit.  No results found for this or any previous visit.  No results found for this or any previous visit.   Assessment & Plan:    1.  OAB (overactive bladder) -vesicare 5mg  daily - BLADDER SCAN AMB NON-IMAGING  2. Benign prostatic hyperplasia with urinary obstruction -  continue uroxatral 10mg  qhs  3. Nocturia -continue uroxatral 10mg  qhs. RTC 4 weeks for possible cystoscopy   No follow-ups on file.  Nicolette Bang, MD  [facility] [place]

## 2021-05-21 NOTE — Progress Notes (Signed)
post void residual=1 Urological Symptom Review  Patient is experiencing the following symptoms: Frequent urination Hard to postpone urination Get up at night to urinate Leakage of urine   Review of Systems  Gastrointestinal (upper)  : Negative for upper GI symptoms  Gastrointestinal (lower) : Negative for lower GI symptoms  Constitutional : Negative for symptoms  Skin: Negative for skin symptoms  Eyes: Negative for eye symptoms  Ear/Nose/Throat : Negative for Ear/Nose/Throat symptoms  Hematologic/Lymphatic: Negative for Hematologic/Lymphatic symptoms  Cardiovascular : Negative for cardiovascular symptoms  Respiratory : Negative for respiratory symptoms  Endocrine: Negative for endocrine symptoms  Musculoskeletal: Negative for musculoskeletal symptoms  Neurological: Negative for neurological symptoms  Psychologic: Negative for psychiatric symptoms

## 2021-05-21 NOTE — Patient Instructions (Signed)

## 2021-06-18 ENCOUNTER — Ambulatory Visit (INDEPENDENT_AMBULATORY_CARE_PROVIDER_SITE_OTHER): Payer: Medicare PPO | Admitting: Urology

## 2021-06-18 ENCOUNTER — Other Ambulatory Visit: Payer: Self-pay

## 2021-06-18 VITALS — BP 159/70 | HR 53

## 2021-06-18 DIAGNOSIS — N281 Cyst of kidney, acquired: Secondary | ICD-10-CM | POA: Diagnosis not present

## 2021-06-18 DIAGNOSIS — N401 Enlarged prostate with lower urinary tract symptoms: Secondary | ICD-10-CM | POA: Diagnosis not present

## 2021-06-18 DIAGNOSIS — R351 Nocturia: Secondary | ICD-10-CM | POA: Diagnosis not present

## 2021-06-18 DIAGNOSIS — N3281 Overactive bladder: Secondary | ICD-10-CM

## 2021-06-18 DIAGNOSIS — N138 Other obstructive and reflux uropathy: Secondary | ICD-10-CM

## 2021-06-18 LAB — URINALYSIS, ROUTINE W REFLEX MICROSCOPIC
Bilirubin, UA: NEGATIVE
Glucose, UA: NEGATIVE
Ketones, UA: NEGATIVE
Leukocytes,UA: NEGATIVE
Nitrite, UA: NEGATIVE
Protein,UA: NEGATIVE
RBC, UA: NEGATIVE
Specific Gravity, UA: 1.02 (ref 1.005–1.030)
Urobilinogen, Ur: 0.2 mg/dL (ref 0.2–1.0)
pH, UA: 5.5 (ref 5.0–7.5)

## 2021-06-18 NOTE — Progress Notes (Signed)
Urological Symptom Review  Patient is experiencing the following symptoms: Frequent urination Hard to postpone urination Get up at night to urinate Leakage of urine   Review of Systems  Gastrointestinal (upper)  : Negative for upper GI symptoms  Gastrointestinal (lower) : Negative for lower GI symptoms  Constitutional : Negative for symptoms  Skin: Negative for skin symptoms  Eyes: Negative for eye symptoms  Ear/Nose/Throat : Negative for Ear/Nose/Throat symptoms  Hematologic/Lymphatic: Negative for Hematologic/Lymphatic symptoms  Cardiovascular : Negative for cardiovascular symptoms  Respiratory : Negative for respiratory symptoms  Endocrine: Negative for endocrine symptoms  Musculoskeletal: buttocks  Neurological: Negative for neurological symptoms  Psychologic: Negative for psychiatric symptoms

## 2021-06-24 ENCOUNTER — Encounter: Payer: Self-pay | Admitting: Urology

## 2021-06-24 NOTE — Progress Notes (Signed)
06/18/2021 7:48 AM   Joseph [name] 04-21-1946 [id number]  Referring provider: Lemmie Evens, MD [place],  [place] [address]  Followup BPH and OAB   HPI: [name] is a 75yo here for followup for BPH and OAB. He is currently on uroxatral 10mg  qhs with stable moderate LUTS. IPSS 14 QOL 3. He was started on vesicare last visit and noted improvement in his nocturia to 1-2x and less urinary frequency. No straining to urinate. No other compalints today   PMH: Past Medical History:  Diagnosis Date   1st degree AV block    Atrial fibrillation (HCC)    Diastolic dysfunction 6/83   grade 1   Hypertension    New onset atrial fibrillation ([place]) 06/2015   RBBB    Sinus bradycardia    Stroke ([place])    no defecits, pt denies having a stroke, says [name] diagnosed this    Surgical History: Past Surgical History:  Procedure Laterality Date   COLONOSCOPY     COLONOSCOPY  12/30/2011   Procedure: COLONOSCOPY;  Surgeon: [name], MD;  [facility];  Service: Endoscopy;  Laterality: N/A;  1:45PM   COLONOSCOPY N/A 04/19/2019   Procedure: COLONOSCOPY;  Surgeon: [name], MD;  [facility];  Service: Endoscopy;  Laterality: N/A;  2:00pm   EXCISION MASS LOWER EXTREMETIES Left 05/12/2018   Procedure: Left foot plantar fibroma excision;  Surgeon: [name], MD;  Location: [place];  Service: Orthopedics;  Laterality: Left;   FOOT SURGERY Left    SPERMATOCELECTOMY Right 11/13/2019   Procedure: EXCISION OF SPERMATIC CORD CYST;  Surgeon: [name], MD;  [facility];  Service: Urology;  Laterality: Right;   VASECTOMY  11/13/2019   Procedure: VASECTOMY;  Surgeon: [name], MD;  Location: [facility];  Service: Urology;;    Home Medications:  Allergies as of 06/18/2021   No Known Allergies      Medication List        Accurate as of June 18, 2021 11:59 PM. If you have any questions, ask your nurse  or doctor.          alfuzosin 10 MG 24 hr tablet Commonly known as: UROXATRAL Take 1 tablet (10 mg total) by mouth daily with breakfast.   amLODipine 10 MG tablet Commonly known as: NORVASC TAKE 1 TABLET(10 MG) BY MOUTH DAILY   apixaban 5 MG Tabs tablet Commonly known as: Eliquis Take 1 tablet (5 mg total) by mouth 2 (two) times daily.   cholecalciferol 25 MCG (1000 UNIT) tablet Commonly known as: VITAMIN D Take 1,000 Units by mouth daily.   CoQ10 200 MG Caps Take 200 mg by mouth daily.   doxazosin 8 MG tablet Commonly known as: CARDURA Take 8 mg by mouth at bedtime.   hydrochlorothiazide 25 MG tablet Commonly known as: HYDRODIURIL   irbesartan 300 MG tablet Commonly known as: AVAPRO TAKE 1 TABLET BY MOUTH DAILY   Lubricant Eye Drops 0.4-0.3 % Soln Generic drug: Polyethyl Glycol-Propyl Glycol Place 1 drop into both eyes 3 (three) times daily as needed (dry/irritated eyes.).   metoprolol tartrate 25 MG tablet Commonly known as: LOPRESSOR Take 1/2 tablet (12.5mg ) to 1 tablet (25mg ) by mouth as needed for elevated heart rate   minoxidil 2.5 MG tablet Commonly known as: LONITEN Take 5 mg by mouth daily.   mirabegron ER 25 MG Tb24 tablet Commonly known as: MYRBETRIQ Take 1 tablet (25 mg  total) by mouth daily.   mirabegron ER 50 MG Tb24 tablet Commonly known as: MYRBETRIQ Take 1 tablet (50 mg total) by mouth daily.   multivitamin with minerals Tabs tablet Take 1 tablet by mouth daily.   PROSTATE HEALTH PO Take 1 capsule by mouth daily. Super Beta Prostate   saw palmetto 500 MG capsule Take 500 mg by mouth every evening.   solifenacin 5 MG tablet Commonly known as: VESICARE TAKE 1 TABLET(5 MG) BY MOUTH DAILY   spironolactone 25 MG tablet Commonly known as: ALDACTONE TAKE 1/2 TABLET BY MOUTH EVERY DAY   Turmeric 500 MG Caps Take 500 mg by mouth daily.   vitamin B-12 500 MCG tablet Commonly known as: CYANOCOBALAMIN Take 500 mcg by mouth daily.         Allergies: No Known Allergies  Family History: Family History  Problem Relation Age of Onset   Cancer Brother    CVA Mother    Hypertension Father    Colon cancer Neg Hx     Social History:  reports that he quit smoking about 22 years ago. His smoking use included cigarettes. He has never used smokeless tobacco. He reports current alcohol use. He reports that he does not use drugs.  ROS: All other review of systems were reviewed and are negative except what is noted above in HPI  Physical Exam: BP (!) 159/70   Pulse (!) 53   Constitutional:  Alert and oriented, No acute distress. HEENT: [place] AT, moist mucus membranes.  Trachea midline, no masses. Cardiovascular: No clubbing, cyanosis, or edema. Respiratory: Normal respiratory effort, no increased work of breathing. GI: Abdomen is soft, nontender, nondistended, no abdominal masses GU: No CVA tenderness.  Lymph: No cervical or inguinal lymphadenopathy. Skin: No rashes, bruises or suspicious lesions. Neurologic: Grossly intact, no focal deficits, moving all 4 extremities. Psychiatric: Normal mood and affect.  Laboratory Data: Lab Results  Component Value Date   WBC 5.4 11/06/2019   HGB 11.8 (L) 11/06/2019   HCT 38.7 (L) 11/06/2019   MCV 71.3 (L) 11/06/2019   PLT 231 11/06/2019    Lab Results  Component Value Date   CREATININE 1.20 01/03/2021    No results found for: PSA  No results found for: TESTOSTERONE  No results found for: HGBA1C  Urinalysis    Component Value Date/Time   COLORURINE YELLOW 11/06/2019 2130   APPEARANCEUR Clear 06/18/2021 1343   LABSPEC 1.016 11/06/2019 2130   PHURINE 5.0 11/06/2019 2130   GLUCOSEU Negative 06/18/2021 1343   HGBUR NEGATIVE 11/06/2019 2130   BILIRUBINUR Negative 06/18/2021 1343   KETONESUR NEGATIVE 11/06/2019 2130   PROTEINUR Negative 06/18/2021 [place] 11/06/2019 2130   NITRITE Negative 06/18/2021 1343   NITRITE NEGATIVE 11/06/2019 2130    LEUKOCYTESUR Negative 06/18/2021 1343   LEUKOCYTESUR NEGATIVE 11/06/2019 2130    Lab Results  Component Value Date   LABMICR Comment 06/18/2021    Pertinent Imaging:  No results found for this or any previous visit.  No results found for this or any previous visit.  No results found for this or any previous visit.  No results found for this or any previous visit.  No results found for this or any previous visit.  No results found for this or any previous visit.  No results found for this or any previous visit.  No results found for this or any previous visit.   Assessment & Plan:    1.  Benign prostatic hyperplasia with urinary obstruction -  Continue uroxatral 10mg  qhs  2. Nocturia Continue uroxatral 10mg  qhs  3. OAB (overactive bladder) -Continue vesicare 5mg    No follow-ups on file.  Nicolette Bang, MD  [facility] [place]

## 2021-06-24 NOTE — Patient Instructions (Signed)

## 2021-06-30 ENCOUNTER — Other Ambulatory Visit: Payer: Self-pay | Admitting: Urology

## 2021-08-16 ENCOUNTER — Other Ambulatory Visit: Payer: Self-pay | Admitting: Cardiovascular Disease

## 2021-08-27 ENCOUNTER — Telehealth: Payer: Self-pay

## 2021-08-27 ENCOUNTER — Encounter: Payer: Self-pay | Admitting: Physician Assistant

## 2021-08-27 ENCOUNTER — Ambulatory Visit (INDEPENDENT_AMBULATORY_CARE_PROVIDER_SITE_OTHER): Payer: Medicare PPO | Admitting: Physician Assistant

## 2021-08-27 ENCOUNTER — Other Ambulatory Visit: Payer: Self-pay

## 2021-08-27 VITALS — BP 132/72 | HR 62 | Ht 70.0 in | Wt 212.4 lb

## 2021-08-27 DIAGNOSIS — R202 Paresthesia of skin: Secondary | ICD-10-CM

## 2021-08-27 DIAGNOSIS — I10 Essential (primary) hypertension: Secondary | ICD-10-CM | POA: Diagnosis not present

## 2021-08-27 DIAGNOSIS — Z8673 Personal history of transient ischemic attack (TIA), and cerebral infarction without residual deficits: Secondary | ICD-10-CM | POA: Diagnosis not present

## 2021-08-27 DIAGNOSIS — I480 Paroxysmal atrial fibrillation: Secondary | ICD-10-CM

## 2021-08-27 DIAGNOSIS — I771 Stricture of artery: Secondary | ICD-10-CM | POA: Diagnosis not present

## 2021-08-27 NOTE — Progress Notes (Signed)
Cardiology Office Note:    Date:  08/28/2021   ID:  Joseph [name], DOB [birth year], MRN [id number]  PCP:  Lemmie Evens, MD   [facility] Providers Cardiologist:  Shelva Majestic, MD     Referring MD: Lemmie Evens, MD   Chief Complaint  Patient presents with   Follow-up    Seen for Dr. Claiborne Billings    History of Present Illness:    Joseph [name] is a 77 y.o. male with a hx of hypertension, PAF on Eliquis, right bundle branch block and history of stroke.  He is a retired Psychologist, prison and probation services.  CT scan in 2013 suggested small lacunar infarction most likely due to hypertension.  Echocardiogram in April 2013 suggested restrictive physiology.  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  [facility] evaluated [facility] who felt the cyst is benign.  Due to elevated blood pressure, losartan was switched to irbesartan.  Echocardiogram in December 2016 showed EF 50 to 55%, grade 2 DD.  Toprol-XL discontinued due to bradycardia.  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  He denied any abdominal discomfort.  Echocardiogram been 08/2018 showed mild LVH, EF 55 to 60%.  Heart monitor in 2021 demonstrated normal sinus rhythm with average heart rate 68, minimal heart rate of 36 which occurred at 5:37 AM, highest heart rate 149, 5 beat run of nonsustained VT and several episodes of SVT.  Last echocardiogram obtained in December 2021 demonstrated EF 55 to 60%, moderate LVH, grade 1 DD.  Heart monitor in February 2022 showed predominantly sinus rhythm with slowest heart rate 38 bpm and fast heart rate 157 bpm, first-degree heart block.  10 episodes of nonsustained VT with fastest interval lasting 4 beats and maximum heart rate of  187, average heart rate 112, frequent short episodes of SVT.  Low-dose beta-blocker was added for as needed basis.  Patient was last seen by Dr. Claiborne Billings in June 2022 at which time he was doing well.  Patient presents today for follow-up.  He denies any recent chest pain worsening dyspnea.  Blood pressure seems to be very well controlled today.  He has noticed occasional spike in the blood pressure in the 180s range, he does admit that he does not take his blood pressure medication consistently.  I urged him to take his amlodipine, hydrochlorothiazide and spironolactone during the day and take the doxazosin, irbesartan and minoxidil at night.  His blood pressure seems to be very well controlled on the current therapy.  I am unable to see his blood work for the past 2 years, I will request the blood work result from Dr. Vickey Sages office.  He is aware that he will need at least annual basic metabolic panel and a fasting lipid panel.  Overall, he is doing well from the cardiac perspective and may follow-up in 6 months.  He does complain of numbness and tingling in the first 3 fingers of his left hand, I suspect this is carpal tunnel syndrome.  He will need to discuss this with his primary care provider.   Past Medical History:  Diagnosis Date   1st degree AV block    Atrial fibrillation (HCC)    Diastolic dysfunction 6/72   grade  1   Hypertension    New onset atrial fibrillation ([place]) 06/2015   RBBB    Sinus bradycardia    Stroke (HCC)    no defecits, pt denies having a stroke, says [name] diagnosed this    Past Surgical History:  Procedure Laterality Date   COLONOSCOPY     COLONOSCOPY  12/30/2011   Procedure: COLONOSCOPY;  Surgeon: [name], MD;  [facility];  Service: Endoscopy;  Laterality: N/A;  1:45PM   COLONOSCOPY N/A 04/19/2019   Procedure: COLONOSCOPY;  Surgeon: [name], MD;  [facility];  Service: Endoscopy;  Laterality: N/A;  2:00pm   EXCISION MASS  LOWER EXTREMETIES Left 05/12/2018   Procedure: Left foot plantar fibroma excision;  Surgeon: [name], MD;  Location: [place];  Service: Orthopedics;  Laterality: Left;   FOOT SURGERY Left    SPERMATOCELECTOMY Right 11/13/2019   Procedure: EXCISION OF SPERMATIC CORD CYST;  Surgeon: [name], MD;  [facility];  Service: Urology;  Laterality: Right;   VASECTOMY  11/13/2019   Procedure: VASECTOMY;  Surgeon: [name], MD;  [facility];  Service: Urology;;    Current Medications: Current Meds  Medication Sig   alfuzosin (UROXATRAL) 10 MG 24 hr tablet Take 1 tablet (10 mg total) by mouth daily with breakfast.   amLODipine (NORVASC) 10 MG tablet TAKE 1 TABLET(10 MG) BY MOUTH DAILY   apixaban (ELIQUIS) 5 MG TABS tablet Take 1 tablet (5 mg total) by mouth 2 (two) times daily.   cholecalciferol (VITAMIN D) 25 MCG (1000 UT) tablet Take 1,000 Units by mouth daily.   Coenzyme Q10 (COQ10) 200 MG CAPS Take 200 mg by mouth daily.   doxazosin (CARDURA) 8 MG tablet Take 8 mg by mouth at bedtime.    hydrochlorothiazide (HYDRODIURIL) 25 MG tablet    irbesartan (AVAPRO) 300 MG tablet TAKE 1 TABLET BY MOUTH DAILY   metoprolol tartrate (LOPRESSOR) 25 MG tablet Take 1/2 tablet (12.5mg ) to 1 tablet (25mg ) by mouth as needed for elevated heart rate   minoxidil (LONITEN) 2.5 MG tablet Take 5 mg by mouth daily.    Misc Natural Products (PROSTATE HEALTH PO) Take 1 capsule by mouth daily. Super Beta Prostate   Multiple Vitamin (MULTIVITAMIN WITH MINERALS) TABS tablet Take 1 tablet by mouth daily.   Polyethyl Glycol-Propyl Glycol (LUBRICANT EYE DROPS) 0.4-0.3 % SOLN Place 1 drop into both eyes 3 (three) times daily as needed (dry/irritated eyes.).   saw palmetto 500 MG capsule Take 500 mg by mouth every evening.   solifenacin (VESICARE) 5 MG tablet TAKE 1 TABLET(5 MG) BY MOUTH DAILY   spironolactone (ALDACTONE) 25 MG tablet TAKE 1/2 TABLET BY MOUTH EVERY DAY   Turmeric  500 MG CAPS Take 500 mg by mouth daily.   vitamin B-12 (CYANOCOBALAMIN) 500 MCG tablet Take 500 mcg by mouth daily.     Allergies:   Patient has no known allergies.   Social History   Socioeconomic History   Marital status: Married    Spouse name: Not on file   Number of children: 5   Years of education: Not on file   Highest education level: Not on file  Occupational History   Not on file  Tobacco Use   Smoking status: Former    Packs/day: 0.00    Years: 4.00    Pack years: 0.00    Types: Cigarettes    Quit date: 2000    Years since  quitting: 23.1   Smokeless tobacco: Never   Tobacco comments:    quit about 40 yrs ago  Vaping Use   Vaping Use: Never used  Substance and Sexual Activity   Alcohol use: Yes    Alcohol/week: 0.0 standard drinks    Comment: 6 pack of beer or less in a week   Drug use: No   Sexual activity: Not on file  Other Topics Concern   Not on file  Social History Narrative   Not on file   Social Determinants of Health   Financial Resource Strain: Not on file  Food Insecurity: Not on file  Transportation Needs: Not on file  Physical Activity: Not on file  Stress: Not on file  Social Connections: Not on file     Family History: The patient's family history includes CVA in his mother; Cancer in his brother; Hypertension in his father. There is no history of Colon cancer.  ROS:   Please see the history of present illness.     All other systems reviewed and are negative.  EKGs/Labs/Other Studies Reviewed:    The following studies were reviewed today:  Echo 08/13/2020 1. Left ventricular ejection fraction, by estimation, is 55 to 60%. The  left ventricle has normal function. The left ventricle has no regional  wall motion abnormalities. There is moderate left ventricular hypertrophy.  Left ventricular diastolic  parameters are consistent with Grade I diastolic dysfunction (impaired  relaxation).   2. Right ventricular systolic function is  normal. The right ventricular  size is normal. There is mildly elevated pulmonary artery systolic  pressure.   3. Left atrial size was moderately dilated.   4. The mitral valve is abnormal. Trivial mitral valve regurgitation.   5. The aortic valve is tricuspid. Aortic valve regurgitation is not  visualized.   6. The inferior vena cava is normal in size with greater than 50%  respiratory variability, suggesting right atrial pressure of 3 mmHg.   Comparison(s): Changes from prior study are noted. 08/29/18: EF 55-60%.   EKG:  EKG is ordered today.  The ekg ordered today demonstrates normal sinus rhythm, right bundle branch block, occasional PVC.  Recent Labs: 01/03/2021: Creatinine, Ser 1.20  Recent Lipid Panel    Component Value Date/Time   CHOL 165 08/15/2019 1410   TRIG 56 08/15/2019 1410   HDL 73 08/15/2019 1410   CHOLHDL 2.3 08/15/2019 1410   LDLCALC 81 08/15/2019 1410     Risk Assessment/Calculations:    CHA2DS2-VASc Score = 3   This indicates a 3.2% annual risk of stroke. The patient's score is based upon: CHF History: 0 HTN History: 1 Diabetes History: 0 Stroke History: 0 Vascular Disease History: 1 Age Score: 1 Gender Score: 0          Physical Exam:    VS:  BP 132/72    Pulse 62    Ht 5\' 10"  (1.778 m)    Wt 212 lb 6.4 oz (96.3 kg)    SpO2 97%    BMI 30.48 kg/m     Wt Readings from Last 3 Encounters:  08/27/21 212 lb 6.4 oz (96.3 kg)  12/27/20 210 lb (95.3 kg)  12/19/20 212 lb (96.2 kg)     GEN:  Well nourished, well developed in no acute distress HEENT: Normal NECK: No JVD; No carotid bruits LYMPHATICS: No lymphadenopathy CARDIAC: RRR, no murmurs, rubs, gallops RESPIRATORY:  Clear to auscultation without rales, wheezing or rhonchi  ABDOMEN: Soft, non-tender, non-distended MUSCULOSKELETAL:  No  edema; No deformity  SKIN: Warm and dry NEUROLOGIC:  Alert and oriented x 3 PSYCHIATRIC:  Normal affect   ASSESSMENT:    1. Paroxysmal A-fib (Joseph [name])   2.  Essential hypertension   3. 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 Left hand paresthesia    PLAN:    In order of problems listed above:  PAF: Continue Eliquis and metoprolol  Hypertension: Blood pressure stable on current therapy.  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  Denies any abdominal discomfort with food  Left hand tingling: Involving the first 3 fingers on the left side, likely carpal tunnel syndrome.  Recommend discuss with PCP.            Medication Adjustments/Labs and Tests Ordered: Current medicines are reviewed at length with the patient today.  Concerns regarding medicines are outlined above.  Orders Placed This Encounter  Procedures   EKG 12-Lead   No orders of the defined types were placed in this encounter.   Patient Instructions  Medication Instructions:  NONE ordered at this time of appointment   *If you need a refill on your cardiac medications before your next appointment, please call your pharmacy*   Lab Work: NONE ordered at this time of appointment    Testing/Procedures: NONE ordered at this time of appointment     Follow-Up: At [facility], you and your health needs are our priority.  As part of our continuing mission to provide you with exceptional heart care, we have created designated Provider Care Teams.  These Care Teams include your primary Cardiologist (physician) and Advanced Practice Providers (APPs -  Physician Assistants and Nurse Practitioners) who all work together to provide you with the care you need, when you need it.  We recommend signing up for the patient portal called "MyChart".  Sign up information is provided on this After Visit Summary.  MyChart is used to connect with patients for Virtual Visits (Telemedicine).  Patients are able to view lab/test results,  encounter notes, upcoming appointments, etc.  Non-urgent messages can be sent to your provider as well.   To learn more about what you can do with MyChart, go to [email or web address].    Your next appointment:   6 month(s)  The format for your next appointment:   In Person  Provider:   Shelva Majestic, MD     Other Instructions     Signed, [name], [place]  08/28/2021 11:50 PM    [place]

## 2021-08-27 NOTE — Patient Instructions (Signed)
Medication Instructions:  NONE ordered at this time of appointment   *If you need a refill on your cardiac medications before your next appointment, please call your pharmacy*   Lab Work: NONE ordered at this time of appointment    Testing/Procedures: NONE ordered at this time of appointment     Follow-Up: At [facility], you and your health needs are our priority.  As part of our continuing mission to provide you with exceptional heart care, we have created designated Provider Care Teams.  These Care Teams include your primary Cardiologist (physician) and Advanced Practice Providers (APPs -  Physician Assistants and Nurse Practitioners) who all work together to provide you with the care you need, when you need it.  We recommend signing up for the patient portal called "MyChart".  Sign up information is provided on this After Visit Summary.  MyChart is used to connect with patients for Virtual Visits (Telemedicine).  Patients are able to view lab/test results, encounter notes, upcoming appointments, etc.  Non-urgent messages can be sent to your provider as well.   To learn more about what you can do with MyChart, go to [email or web address].    Your next appointment:   6 month(s)  The format for your next appointment:   In Person  Provider:   Shelva Majestic, MD     Other Instructions

## 2021-08-27 NOTE — Telephone Encounter (Signed)
Pt was seen in office today 08/27/21 by [name]. [name] is requesting lab results from pts PCP. I called Pt's PCP office at [phone] and haven't been able to reach anyone. Per Dr Vickey Sages office VM, their office will be closed Thursday 08/28/21 & Friday 08/29/21. Per the PCP VM, a VM can't be left on their recording. Will continue to follow up on Lab results needed for [name] when PCP office opens Monday 09/01/21.

## 2021-08-28 ENCOUNTER — Encounter: Payer: Self-pay | Admitting: Physician Assistant

## 2021-09-01 NOTE — Telephone Encounter (Signed)
FYI, Fax received @3 :40 PM on 09/01/21 from [facility]. Lab results were given to [name], NP to review.

## 2021-09-01 NOTE — Telephone Encounter (Signed)
Spoke with Jacqlyn Larsen at pts PCP office. She is going to fax pts lab results from 2022. Dr. [name] would also like pts last ov note from our office faxed to [phone]. Faxing ov note ok per [name], NP.

## 2021-09-01 NOTE — Telephone Encounter (Signed)
Called pts PCP office this morning 09/01/21 @ 8:50 AM and the call went to VM. Per the VM, no VM should be left. I will call back to retrieve labs.

## 2021-09-23 ENCOUNTER — Ambulatory Visit (INDEPENDENT_AMBULATORY_CARE_PROVIDER_SITE_OTHER): Payer: Medicare PPO | Admitting: Urology

## 2021-09-23 ENCOUNTER — Encounter: Payer: Self-pay | Admitting: Urology

## 2021-09-23 ENCOUNTER — Other Ambulatory Visit: Payer: Self-pay

## 2021-09-23 VITALS — BP 155/70 | HR 74

## 2021-09-23 DIAGNOSIS — N3281 Overactive bladder: Secondary | ICD-10-CM

## 2021-09-23 DIAGNOSIS — N281 Cyst of kidney, acquired: Secondary | ICD-10-CM

## 2021-09-23 DIAGNOSIS — R351 Nocturia: Secondary | ICD-10-CM | POA: Diagnosis not present

## 2021-09-23 DIAGNOSIS — N401 Enlarged prostate with lower urinary tract symptoms: Secondary | ICD-10-CM | POA: Diagnosis not present

## 2021-09-23 DIAGNOSIS — N138 Other obstructive and reflux uropathy: Secondary | ICD-10-CM | POA: Diagnosis not present

## 2021-09-23 MED ORDER — ALFUZOSIN HCL ER 10 MG PO TB24: 10.0000 mg | ORAL_TABLET | Freq: Every day | ORAL | 3 refills | Status: DC

## 2021-09-23 MED ORDER — SOLIFENACIN SUCCINATE 5 MG PO TABS: ORAL_TABLET | ORAL | 3 refills | Status: DC

## 2021-09-23 NOTE — Patient Instructions (Signed)

## 2021-09-23 NOTE — Progress Notes (Signed)
? ?09/23/2021 ?9:49 AM  ? ?Trinna Balloon Karnes ?November 04, 1945 ?[id number] ? ?Referring provider: Lemmie Evens, MD ?[address] ?[place],  [place] [address] ? ?Followup OAB and BPH ? ? ?HPI: ?[name] is a 76yo here for followup for OAB and BPH with Nocturia. His urinary urgency, frequency and urge incontinence is controlled with vesicare '5mg'$  daily. IPSS 13 QOL 3. Nocturia 2x on uroxatral '10mg'$  qhs. His urine stream is strong. No strainign to urinate. No urinary hesitancy. No other complaints today ? ?PMH: ?Past Medical History:  ?Diagnosis Date  ? 1st degree AV block   ? Atrial fibrillation ([place])   ? Diastolic dysfunction 7/03  ? grade 1  ? Hypertension   ? New onset atrial fibrillation ([place]) 06/2015  ? RBBB   ? Sinus bradycardia   ? Stroke Joseph [name])   ? no defecits, pt denies having a stroke, says [name] diagnosed this  ? ? ?Surgical History: ?Past Surgical History:  ?Procedure Laterality Date  ? COLONOSCOPY    ? COLONOSCOPY  12/30/2011  ? Procedure: COLONOSCOPY;  Surgeon: [name], MD;  [facility];  Service: Endoscopy;  Laterality: N/A;  1:45PM  ? COLONOSCOPY N/A 04/19/2019  ? Procedure: COLONOSCOPY;  Surgeon: [name], MD;  [facility];  Service: Endoscopy;  Laterality: N/A;  2:00pm  ? EXCISION MASS LOWER EXTREMETIES Left 05/12/2018  ? Procedure: Left foot plantar fibroma excision;  Surgeon: [name], MD;  Location: [place] [place];  Service: Orthopedics;  Laterality: Left;  ? FOOT SURGERY Left   ? SPERMATOCELECTOMY Right 11/13/2019  ? Procedure: EXCISION OF SPERMATIC CORD CYST;  Surgeon: [name], MD;  [facility];  Service: Urology;  Laterality: Right;  ? VASECTOMY  11/13/2019  ? Procedure: VASECTOMY;  Surgeon: [name], MD;  [facility];  Service: Urology;;  ? ? ?Home Medications:  ?Allergies as of 09/23/2021   ?No Known Allergies ?  ? ?  ?Medication List  ?  ? ?  ? Accurate as of September 23, 2021  9:49 AM. If you have any questions, ask  your nurse or doctor.  ?  ?  ? ?  ? ?alfuzosin 10 MG 24 hr tablet ?Commonly known as: UROXATRAL ?Take 1 tablet (10 mg total) by mouth daily with breakfast. ?  ?amLODipine 10 MG tablet ?Commonly known as: NORVASC ?TAKE 1 TABLET(10 MG) BY MOUTH DAILY ?  ?apixaban 5 MG Tabs tablet ?Commonly known as: Eliquis ?Take 1 tablet (5 mg total) by mouth 2 (two) times daily. ?  ?cholecalciferol 25 MCG (1000 UNIT) tablet ?Commonly known as: VITAMIN D ?Take 1,000 Units by mouth daily. ?  ?CoQ10 200 MG Caps ?Take 200 mg by mouth daily. ?  ?doxazosin 8 MG tablet ?Commonly known as: CARDURA ?Take 8 mg by mouth at bedtime. ?  ?hydrochlorothiazide 25 MG tablet ?Commonly known as: HYDRODIURIL ?  ?irbesartan 300 MG tablet ?Commonly known as: AVAPRO ?TAKE 1 TABLET BY MOUTH DAILY ?  ?latanoprost 0.005 % ophthalmic solution ?Commonly known as: XALATAN ?1 drop at bedtime. ?  ?Lubricant Eye Drops 0.4-0.3 % Soln ?Generic drug: Polyethyl Glycol-Propyl Glycol ?Place 1 drop into both eyes 3 (three) times daily as needed (dry/irritated eyes.). ?  ?metoprolol tartrate 25 MG tablet ?Commonly known as: LOPRESSOR ?Take 1/2 tablet (12.'5mg'$ ) to 1 tablet ('25mg'$ ) by mouth as needed for elevated heart rate ?  ?minoxidil 2.5 MG tablet ?Commonly known as: LONITEN ?Take 5 mg by mouth daily. ?  ?multivitamin  with minerals Tabs tablet ?Take 1 tablet by mouth daily. ?  ?PROSTATE HEALTH PO ?Take 1 capsule by mouth daily. Super Beta Prostate ?  ?saw palmetto 500 MG capsule ?Take 500 mg by mouth every evening. ?  ?solifenacin 5 MG tablet ?Commonly known as: VESICARE ?TAKE 1 TABLET(5 MG) BY MOUTH DAILY ?  ?spironolactone 25 MG tablet ?Commonly known as: ALDACTONE ?TAKE 1/2 TABLET BY MOUTH EVERY DAY ?  ?Turmeric 500 MG Caps ?Take 500 mg by mouth daily. ?  ?vitamin B-12 500 MCG tablet ?Commonly known as: CYANOCOBALAMIN ?Take 500 mcg by mouth daily. ?  ? ?  ? ? ?Allergies: No Known Allergies ? ?Family History: ?Family History  ?Problem Relation Age of Onset  ? Cancer  Brother   ? CVA Mother   ? Hypertension Father   ? Colon cancer Neg Hx   ? ? ?Social History:  reports that he quit smoking about 23 years ago. His smoking use included cigarettes. He has never used smokeless tobacco. He reports current alcohol use. He reports that he does not use drugs. ? ?ROS: ?All other review of systems were reviewed and are negative except what is noted above in HPI ? ?Physical Exam: ?BP (!) 155/70   Pulse 74   ?Constitutional:  Alert and oriented, No acute distress. ?HEENT: [place] AT, moist mucus membranes.  Trachea midline, no masses. ?Cardiovascular: No clubbing, cyanosis, or edema. ?Respiratory: Normal respiratory effort, no increased work of breathing. ?GI: Abdomen is soft, nontender, nondistended, no abdominal masses ?GU: No CVA tenderness.  ?Lymph: No cervical or inguinal lymphadenopathy. ?Skin: No rashes, bruises or suspicious lesions. ?Neurologic: Grossly intact, no focal deficits, moving all 4 extremities. ?Psychiatric: Normal mood and affect. ? ?Laboratory Data: ?Lab Results  ?Component Value Date  ? WBC 5.4 11/06/2019  ? HGB 11.8 (L) 11/06/2019  ? HCT 38.7 (L) 11/06/2019  ? MCV 71.3 (L) 11/06/2019  ? PLT 231 11/06/2019  ? ? ?Lab Results  ?Component Value Date  ? CREATININE 1.20 01/03/2021  ? ? ?No results found for: PSA ? ?No results found for: TESTOSTERONE ? ?No results found for: HGBA1C ? ?Urinalysis ?   ?Component Value Date/Time  ? [place] YELLOW 11/06/2019 2130  ? APPEARANCEUR Clear 06/18/2021 1343  ? LABSPEC 1.016 11/06/2019 2130  ? PHURINE 5.0 11/06/2019 2130  ? GLUCOSEU Negative 06/18/2021 1343  ? [place] NEGATIVE 11/06/2019 2130  ? BILIRUBINUR Negative 06/18/2021 1343  ? [place] NEGATIVE 11/06/2019 2130  ? PROTEINUR Negative 06/18/2021 1343  ? [place] NEGATIVE 11/06/2019 2130  ? NITRITE Negative 06/18/2021 1343  ? NITRITE NEGATIVE 11/06/2019 2130  ? LEUKOCYTESUR Negative 06/18/2021 1343  ? LEUKOCYTESUR NEGATIVE 11/06/2019 2130  ? ? ?Lab Results  ?Component Value Date  ?  LABMICR Comment 06/18/2021  ? ? ?Pertinent Imaging: ? ?No results found for this or any previous visit. ? ?No results found for this or any previous visit. ? ?No results found for this or any previous visit. ? ?No results found for this or any previous visit. ? ?No results found for this or any previous visit. ? ?No results found for this or any previous visit. ? ?No results found for this or any previous visit. ? ?No results found for this or any previous visit. ? ? ?Assessment & Plan:   ? ?1. Benign prostatic hyperplasia with urinary obstruction ?-Continue uroxatral '10mg'$  qhs ?- Urinalysis, Routine w reflex microscopic ? ?2. OAB ?-Continue vesicare '5mg'$  daily ? ?3. Nocturia ?-Continue vesicare '5mg'$  daily ?Continue uroxatral '10mg'$  qhs ? ? ?  No follow-ups on file. ? ?Nicolette Bang, MD ? ?[facility] [place] ?  ?

## 2021-09-24 LAB — URINALYSIS, ROUTINE W REFLEX MICROSCOPIC
Bilirubin, UA: NEGATIVE
Glucose, UA: NEGATIVE
Ketones, UA: NEGATIVE
Leukocytes,UA: NEGATIVE
Nitrite, UA: NEGATIVE
Protein,UA: NEGATIVE
Specific Gravity, UA: 1.015 (ref 1.005–1.030)
Urobilinogen, Ur: 0.2 mg/dL (ref 0.2–1.0)
pH, UA: 7 (ref 5.0–7.5)

## 2021-09-24 LAB — MICROSCOPIC EXAMINATION
Bacteria, UA: NONE SEEN
Epithelial Cells (non renal): NONE SEEN /hpf (ref 0–10)
RBC, Urine: NONE SEEN /hpf (ref 0–2)
Renal Epithel, UA: NONE SEEN /hpf
WBC, UA: NONE SEEN /hpf (ref 0–5)

## 2021-10-30 ENCOUNTER — Other Ambulatory Visit (HOSPITAL_COMMUNITY): Payer: Self-pay | Admitting: Neurology

## 2021-10-30 ENCOUNTER — Other Ambulatory Visit: Payer: Self-pay | Admitting: Neurology

## 2021-10-30 DIAGNOSIS — I63542 Cerebral infarction due to unspecified occlusion or stenosis of left cerebellar artery: Secondary | ICD-10-CM

## 2021-11-04 ENCOUNTER — Other Ambulatory Visit: Payer: Self-pay | Admitting: Urology

## 2021-11-11 ENCOUNTER — Encounter (HOSPITAL_COMMUNITY): Payer: Self-pay

## 2021-11-11 ENCOUNTER — Ambulatory Visit (HOSPITAL_COMMUNITY): Admission: RE | Admit: 2021-11-11 | Payer: Medicare PPO | Source: Ambulatory Visit

## 2021-11-12 ENCOUNTER — Ambulatory Visit: Payer: Medicare PPO | Admitting: Cardiovascular Disease

## 2022-01-23 ENCOUNTER — Inpatient Hospital Stay (HOSPITAL_COMMUNITY)
Admission: EM | Admit: 2022-01-23 | Discharge: 2022-01-27 | DRG: 243 | Disposition: A | Discharge status: Home / Self Care | Payer: Medicare PPO | Attending: Internal Medicine | Admitting: Internal Medicine

## 2022-01-23 ENCOUNTER — Emergency Department (HOSPITAL_COMMUNITY): Payer: Medicare PPO

## 2022-01-23 ENCOUNTER — Other Ambulatory Visit: Payer: Self-pay

## 2022-01-23 ENCOUNTER — Encounter (HOSPITAL_COMMUNITY): Payer: Self-pay

## 2022-01-23 DIAGNOSIS — I440 Atrioventricular block, first degree: Secondary | ICD-10-CM | POA: Diagnosis present

## 2022-01-23 DIAGNOSIS — I4891 Unspecified atrial fibrillation: Secondary | ICD-10-CM | POA: Diagnosis not present

## 2022-01-23 DIAGNOSIS — R000 Tachycardia, unspecified: Secondary | ICD-10-CM | POA: Diagnosis present

## 2022-01-23 DIAGNOSIS — I495 Sick sinus syndrome: Secondary | ICD-10-CM | POA: Diagnosis present

## 2022-01-23 DIAGNOSIS — Z8249 Family history of ischemic heart disease and other diseases of the circulatory system: Secondary | ICD-10-CM | POA: Diagnosis not present

## 2022-01-23 DIAGNOSIS — Z79899 Other long term (current) drug therapy: Secondary | ICD-10-CM

## 2022-01-23 DIAGNOSIS — I5032 Chronic diastolic (congestive) heart failure: Secondary | ICD-10-CM | POA: Diagnosis present

## 2022-01-23 DIAGNOSIS — I110 Hypertensive heart disease with heart failure: Secondary | ICD-10-CM | POA: Diagnosis present

## 2022-01-23 DIAGNOSIS — Z823 Family history of stroke: Secondary | ICD-10-CM

## 2022-01-23 DIAGNOSIS — I428 Other cardiomyopathies: Secondary | ICD-10-CM | POA: Diagnosis present

## 2022-01-23 DIAGNOSIS — Z8673 Personal history of transient ischemic attack (TIA), and cerebral infarction without residual deficits: Secondary | ICD-10-CM | POA: Diagnosis not present

## 2022-01-23 DIAGNOSIS — Z87891 Personal history of nicotine dependence: Secondary | ICD-10-CM

## 2022-01-23 DIAGNOSIS — N400 Enlarged prostate without lower urinary tract symptoms: Secondary | ICD-10-CM | POA: Diagnosis present

## 2022-01-23 DIAGNOSIS — I484 Atypical atrial flutter: Secondary | ICD-10-CM | POA: Diagnosis not present

## 2022-01-23 DIAGNOSIS — I480 Paroxysmal atrial fibrillation: Secondary | ICD-10-CM | POA: Diagnosis present

## 2022-01-23 DIAGNOSIS — I482 Chronic atrial fibrillation, unspecified: Secondary | ICD-10-CM | POA: Diagnosis present

## 2022-01-23 DIAGNOSIS — I10 Essential (primary) hypertension: Secondary | ICD-10-CM | POA: Diagnosis not present

## 2022-01-23 DIAGNOSIS — Z7901 Long term (current) use of anticoagulants: Secondary | ICD-10-CM

## 2022-01-23 DIAGNOSIS — I4892 Unspecified atrial flutter: Secondary | ICD-10-CM | POA: Diagnosis present

## 2022-01-23 DIAGNOSIS — I4510 Unspecified right bundle-branch block: Secondary | ICD-10-CM | POA: Diagnosis present

## 2022-01-23 LAB — COMPREHENSIVE METABOLIC PANEL
ALT: 17 U/L (ref 0–44)
AST: 21 U/L (ref 15–41)
Albumin: 3.5 g/dL (ref 3.5–5.0)
Alkaline Phosphatase: 70 U/L (ref 38–126)
Anion gap: 8 (ref 5–15)
BUN: 25 mg/dL — ABNORMAL HIGH (ref 8–23)
CO2: 23 mmol/L (ref 22–32)
Calcium: 8.4 mg/dL — ABNORMAL LOW (ref 8.9–10.3)
Chloride: 111 mmol/L (ref 98–111)
Creatinine, Ser: 1.44 mg/dL — ABNORMAL HIGH (ref 0.61–1.24)
GFR, Estimated: 51 mL/min — ABNORMAL LOW (ref >60)
Glucose, Bld: 95 mg/dL (ref 70–99)
Potassium: 3.6 mmol/L (ref 3.5–5.1)
Sodium: 142 mmol/L (ref 135–145)
Total Bilirubin: 1.2 mg/dL (ref 0.3–1.2)
Total Protein: 6.4 g/dL — ABNORMAL LOW (ref 6.5–8.1)

## 2022-01-23 LAB — CBC WITH DIFFERENTIAL/PLATELET
Abs Immature Granulocytes: 0 10*3/uL (ref 0.00–0.07)
Basophils Absolute: 0 10*3/uL (ref 0.0–0.1)
Basophils Relative: 1 %
Eosinophils Absolute: 0.1 10*3/uL (ref 0.0–0.5)
Eosinophils Relative: 2 %
HCT: 38.4 % — ABNORMAL LOW (ref 39.0–52.0)
Hemoglobin: 12.3 g/dL — ABNORMAL LOW (ref 13.0–17.0)
Immature Granulocytes: 0 %
Lymphocytes Relative: 34 %
Lymphs Abs: 1.5 10*3/uL (ref 0.7–4.0)
MCH: 22.2 pg — ABNORMAL LOW (ref 26.0–34.0)
MCHC: 32 g/dL (ref 30.0–36.0)
MCV: 69.2 fL — ABNORMAL LOW (ref 80.0–100.0)
Monocytes Absolute: 0.4 10*3/uL (ref 0.1–1.0)
Monocytes Relative: 9 %
Neutro Abs: 2.3 10*3/uL (ref 1.7–7.7)
Neutrophils Relative %: 54 %
Platelets: 187 10*3/uL (ref 150–400)
RBC: 5.55 MIL/uL (ref 4.22–5.81)
RDW: 16.2 % — ABNORMAL HIGH (ref 11.5–15.5)
WBC: 4.3 10*3/uL (ref 4.0–10.5)
nRBC: 0 % (ref 0.0–0.2)

## 2022-01-23 LAB — TROPONIN I (HIGH SENSITIVITY)
Troponin I (High Sensitivity): 36 ng/L — ABNORMAL HIGH (ref <18)
Troponin I (High Sensitivity): 39 ng/L — ABNORMAL HIGH (ref <18)

## 2022-01-23 LAB — BRAIN NATRIURETIC PEPTIDE: B Natriuretic Peptide: 372 pg/mL — ABNORMAL HIGH (ref 0.0–100.0)

## 2022-01-23 MED ORDER — SODIUM CHLORIDE 0.9 % IV SOLN: Freq: Once | INTRAVENOUS | Status: AC

## 2022-01-23 MED ORDER — LACTATED RINGERS IV BOLUS: 1000.0000 mL | Freq: Once | INTRAVENOUS | Status: DC

## 2022-01-23 NOTE — ED Triage Notes (Signed)
Pt presents with c/o elevated heart rate. Pt states he checks his heart rate periodically and it was 139, began feeling a little lightheaded.

## 2022-01-23 NOTE — ED Notes (Signed)
Lab at bedside

## 2022-01-24 ENCOUNTER — Inpatient Hospital Stay (HOSPITAL_COMMUNITY): Payer: Medicare PPO

## 2022-01-24 ENCOUNTER — Other Ambulatory Visit (HOSPITAL_COMMUNITY): Payer: Medicare PPO

## 2022-01-24 DIAGNOSIS — I4891 Unspecified atrial fibrillation: Secondary | ICD-10-CM

## 2022-01-24 DIAGNOSIS — I10 Essential (primary) hypertension: Secondary | ICD-10-CM | POA: Diagnosis not present

## 2022-01-24 DIAGNOSIS — I484 Atypical atrial flutter: Secondary | ICD-10-CM | POA: Diagnosis not present

## 2022-01-24 DIAGNOSIS — I495 Sick sinus syndrome: Secondary | ICD-10-CM | POA: Diagnosis not present

## 2022-01-24 LAB — ECHOCARDIOGRAM COMPLETE
Height: 70 in
S' Lateral: 4.8 cm
Weight: 3456.81 oz

## 2022-01-24 LAB — CBC
HCT: 41.5 % (ref 39.0–52.0)
Hemoglobin: 13.1 g/dL (ref 13.0–17.0)
MCH: 21.8 pg — ABNORMAL LOW (ref 26.0–34.0)
MCHC: 31.6 g/dL (ref 30.0–36.0)
MCV: 69.2 fL — ABNORMAL LOW (ref 80.0–100.0)
Platelets: 179 10*3/uL (ref 150–400)
RBC: 6 MIL/uL — ABNORMAL HIGH (ref 4.22–5.81)
RDW: 16.2 % — ABNORMAL HIGH (ref 11.5–15.5)
WBC: 4.6 10*3/uL (ref 4.0–10.5)
nRBC: 0 % (ref 0.0–0.2)

## 2022-01-24 LAB — TYPE AND SCREEN
ABO/RH(D): O POS
Antibody Screen: NEGATIVE

## 2022-01-24 LAB — BASIC METABOLIC PANEL
Anion gap: 10 (ref 5–15)
BUN: 20 mg/dL (ref 8–23)
CO2: 22 mmol/L (ref 22–32)
Calcium: 8.8 mg/dL — ABNORMAL LOW (ref 8.9–10.3)
Chloride: 109 mmol/L (ref 98–111)
Creatinine, Ser: 1.33 mg/dL — ABNORMAL HIGH (ref 0.61–1.24)
GFR, Estimated: 56 mL/min — ABNORMAL LOW (ref >60)
Glucose, Bld: 115 mg/dL — ABNORMAL HIGH (ref 70–99)
Potassium: 4.3 mmol/L (ref 3.5–5.1)
Sodium: 141 mmol/L (ref 135–145)

## 2022-01-24 LAB — MAGNESIUM: Magnesium: 2 mg/dL (ref 1.7–2.4)

## 2022-01-24 MED ORDER — ACETAMINOPHEN 325 MG PO TABS: 650.0000 mg | ORAL_TABLET | ORAL | Status: DC | PRN

## 2022-01-24 MED ORDER — DOXAZOSIN MESYLATE 8 MG PO TABS
8.0000 mg | ORAL_TABLET | Freq: Every day | ORAL | Status: DC
  Administered 2022-01-24 – 2022-01-26 (×3): 8 mg via ORAL
  Filled 2022-01-24 (×4): qty 1

## 2022-01-24 MED ORDER — VITAMIN D 25 MCG (1000 UNIT) PO TABS
1000.0000 [IU] | ORAL_TABLET | Freq: Every day | ORAL | Status: DC
  Administered 2022-01-24 – 2022-01-27 (×3): 1000 [IU] via ORAL
  Filled 2022-01-24 (×3): qty 1

## 2022-01-24 MED ORDER — APIXABAN 5 MG PO TABS
5.0000 mg | ORAL_TABLET | Freq: Two times a day (BID) | ORAL | Status: DC
Start: 2022-01-24 — End: 2022-01-24

## 2022-01-24 MED ORDER — MINOXIDIL 2.5 MG PO TABS
5.0000 mg | ORAL_TABLET | Freq: Every day | ORAL | Status: DC
  Filled 2022-01-24 (×2): qty 2

## 2022-01-24 MED ORDER — ADULT MULTIVITAMIN W/MINERALS CH
1.0000 | ORAL_TABLET | Freq: Every day | ORAL | Status: DC
  Administered 2022-01-24 – 2022-01-27 (×3): 1 via ORAL
  Filled 2022-01-24 (×3): qty 1

## 2022-01-24 MED ORDER — IRBESARTAN 150 MG PO TABS
300.0000 mg | ORAL_TABLET | Freq: Every day | ORAL | Status: DC
  Administered 2022-01-24 – 2022-01-26 (×3): 300 mg via ORAL
  Filled 2022-01-24 (×3): qty 2

## 2022-01-24 MED ORDER — VITAMIN B-12 1000 MCG PO TABS
500.0000 ug | ORAL_TABLET | Freq: Every day | ORAL | Status: DC
  Administered 2022-01-24 – 2022-01-27 (×3): 500 ug via ORAL
  Filled 2022-01-24 (×3): qty 1

## 2022-01-24 MED ORDER — POLYVINYL ALCOHOL 1.4 % OP SOLN: 1.0000 [drp] | Freq: Three times a day (TID) | OPHTHALMIC | Status: DC | PRN

## 2022-01-24 MED ORDER — SPIRONOLACTONE 12.5 MG HALF TABLET
12.5000 mg | ORAL_TABLET | Freq: Every day | ORAL | Status: DC
Start: 2022-01-24 — End: 2022-01-27
  Administered 2022-01-24 – 2022-01-26 (×3): 12.5 mg via ORAL
  Filled 2022-01-24 (×3): qty 1

## 2022-01-24 MED ORDER — AMLODIPINE BESYLATE 10 MG PO TABS
10.0000 mg | ORAL_TABLET | Freq: Every day | ORAL | Status: DC
  Administered 2022-01-24 – 2022-01-27 (×4): 10 mg via ORAL
  Filled 2022-01-24 (×4): qty 1

## 2022-01-24 MED ORDER — MIRABEGRON ER 50 MG PO TB24
50.0000 mg | ORAL_TABLET | Freq: Every day | ORAL | Status: DC
  Administered 2022-01-24 – 2022-01-27 (×4): 50 mg via ORAL
  Filled 2022-01-24 (×4): qty 1

## 2022-01-24 NOTE — Progress Notes (Signed)
  Echocardiogram 2D Echocardiogram has been performed.  Joseph [name] 01/24/2022, 5:50 PM

## 2022-01-24 NOTE — Consult Note (Signed)
Cardiology Consultation:   Patient ID: Joseph [name] MRN: [id number]; DOB: 1945-11-10  Admit date: 01/23/2022 Date of Consult: 01/24/2022  PCP:  Lemmie Evens, MD   [facility] Providers Cardiologist:  Shelva Majestic, MD       Patient Profile:   Joseph [name] is a 76 y.o. 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 Alda Lea.  History of Present Illness:   Joseph [name] is a very pleasant 76 year old patient of Dr. Evette Georges.  He has a history of paroxysmal atrial fibrillation and flutter.  He also has a history of sinus node dysfunction.  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  He is remained fairly active.  He has hypertension requiring multiple medications which has been fairly difficult to control.  He presented to his primary care physician yesterday afternoon feeling "woozy".  The patient was found to be in atrial fibrillation with a rapid ventricular response at rates up to 160 bpm.  At times she would return back to sinus rhythm with brief pauses and heart rates in the low 30s.  This was despite medical therapy with beta-blockers and calcium channel blockers.  The patient has remained on systemic anticoagulation.  He denies heart failure symptoms or angina.  He has baseline right bundle branch block.  He denies syncope.  He does note near syncope.   Past Medical History:  Diagnosis Date   1st degree AV block    Atrial fibrillation (HCC)    Diastolic dysfunction 4/09   grade 1   Hypertension    New onset atrial fibrillation ([place]) 06/2015   RBBB    Sinus bradycardia    Stroke ([place])    no defecits, pt denies having a stroke, says [name] diagnosed this    Past Surgical History:  Procedure Laterality Date   COLONOSCOPY     COLONOSCOPY  12/30/2011   Procedure: COLONOSCOPY;  Surgeon: [name], MD;  [facility];  Service: Endoscopy;  Laterality: N/A;  1:45PM   COLONOSCOPY N/A 04/19/2019   Procedure: COLONOSCOPY;  Surgeon: [name], MD;  Location[facility];  Service: Endoscopy;  Laterality: N/A;  2:00pm   EXCISION MASS LOWER EXTREMETIES Left 05/12/2018   Procedure: Left foot plantar fibroma excision;  Surgeon: [name], MD;  Location: [place];  Service: Orthopedics;  Laterality: Left;   FOOT SURGERY Left    SPERMATOCELECTOMY Right 11/13/2019   Procedure: EXCISION OF SPERMATIC CORD CYST;  Surgeon: [name], MD;  Location: [facility];  Service: Urology;  Laterality: Right;   VASECTOMY  11/13/2019   Procedure: VASECTOMY;  Surgeon: [name], MD;  Location: [facility];  Service: Urology;;     Home Medications:  Prior to Admission medications   Medication Sig Start Date End Date Taking? Authorizing Provider  alfuzosin (UROXATRAL) 10 MG 24 hr tablet Take 1 tablet (10 mg total) by mouth daily with breakfast. 09/23/21   McKenzie, Candee Furbish, MD  amLODipine (NORVASC) 10 MG tablet TAKE 1 TABLET(10 MG) BY MOUTH DAILY Patient taking differently: Take 10 mg by mouth daily. 03/13/21   Troy Sine, MD  apixaban (ELIQUIS) 5 MG TABS tablet Take 1 tablet (5 mg total) by mouth 2 (two) times daily. 07/18/15   Brett Canales, PA-C  cholecalciferol (VITAMIN D) 25 MCG (1000 UT) tablet  Take 1,000 Units by mouth daily.    [provider]  cloNIDine (CATAPRES) 0.3 MG tablet Take 0.3 mg by mouth 2 (two) times daily. 12/29/21   [provider]  Coenzyme Q10 (COQ10) 200 MG CAPS Take 200 mg by mouth daily.    [provider]  doxazosin (CARDURA) 8 MG tablet Take 8 mg by mouth at bedtime.     [provider]  irbesartan (AVAPRO) 300 MG tablet TAKE 1 TABLET BY MOUTH DAILY Patient taking differently: Take 300 mg by mouth daily. 08/18/21   Troy Sine, MD  latanoprost (XALATAN) 0.005 % ophthalmic solution 1 drop at  bedtime. 09/12/21   [provider]  metoprolol tartrate (LOPRESSOR) 25 MG tablet Take 1/2 tablet (12.'5mg'$ ) to 1 tablet ('25mg'$ ) by mouth as needed for elevated heart rate 10/31/20   Troy Sine, MD  minoxidil (LONITEN) 2.5 MG tablet Take 5 mg by mouth daily.     [provider]  Misc Natural Products (PROSTATE HEALTH PO) Take 1 capsule by mouth daily. Super Beta Prostate    [provider]  Multiple Vitamin (MULTIVITAMIN WITH MINERALS) TABS tablet Take 1 tablet by mouth daily.    [provider]  MYRBETRIQ 50 MG TB24 tablet TAKE 1 TABLET(50 MG) BY MOUTH DAILY Patient taking differently: Take 50 mg by mouth daily. 11/10/21   McKenzie, Candee Furbish, MD  Polyethyl Glycol-Propyl Glycol (LUBRICANT EYE DROPS) 0.4-0.3 % SOLN Place 1 drop into both eyes 3 (three) times daily as needed (dry/irritated eyes.).    [provider]  saw palmetto 500 MG capsule Take 500 mg by mouth every evening.    [provider]  solifenacin (VESICARE) 5 MG tablet TAKE 1 TABLET(5 MG) BY MOUTH DAILY Patient taking differently: Take 5 mg by mouth daily. 09/23/21   McKenzie, Candee Furbish, MD  spironolactone (ALDACTONE) 25 MG tablet TAKE 1/2 TABLET BY MOUTH EVERY DAY Patient taking differently: Take 12.5 mg by mouth daily. 04/28/21   Troy Sine, MD  Turmeric 500 MG CAPS Take 500 mg by mouth daily.    [provider]  vitamin B-12 (CYANOCOBALAMIN) 500 MCG tablet Take 500 mcg by mouth daily.    [provider]    Inpatient Medications: Scheduled Meds:  amLODipine  10 mg Oral Daily   apixaban  5 mg Oral BID   cholecalciferol  1,000 Units Oral Daily   doxazosin  8 mg Oral QHS   irbesartan  300 mg Oral Daily   minoxidil  5 mg Oral Daily   mirabegron ER  50 mg Oral Daily   multivitamin with minerals  1 tablet Oral Daily   spironolactone  12.5 mg Oral Daily   vitamin B-12  500 mcg Oral Daily   Continuous Infusions:  PRN Meds: acetaminophen, polyvinyl  alcohol  Allergies:   No Known Allergies  Social History:   Social History   Socioeconomic History   Marital status: Married    Spouse name: Not on file   Number of children: 5   Years of education: Not on file   Highest education level: Not on file  Occupational History   Not on file  Tobacco Use   Smoking status: Former    Packs/day: 0.00    Years: 4.00    Total pack years: 0.00    Types: Cigarettes    Quit date: 2000    Years since quitting: 23.5   Smokeless tobacco: Never   Tobacco comments:    quit about  40 yrs ago  Vaping Use   Vaping Use: Never used  Substance and Sexual Activity   Alcohol use: Yes    Alcohol/week: 0.0 standard drinks of alcohol    Comment: 6 pack of beer or less in a week   Drug use: No   Sexual activity: Not on file  Other Topics Concern   Not on file  Social History Narrative   Not on file   Social Determinants of Health   Financial Resource Strain: Not on file  Food Insecurity: Not on file  Transportation Needs: Not on file  Physical Activity: Not on file  Stress: Not on file  Social Connections: Not on file  Intimate Partner Violence: Not on file    Family History:    Family History  Problem Relation Age of Onset   Cancer Brother    CVA Mother    Hypertension Father    Colon cancer Neg Hx      ROS:  Please see the history of present illness.   All other ROS reviewed and negative.     Physical Exam/Data:   Vitals:   01/24/22 0552 01/24/22 0600 01/24/22 0729 01/24/22 0809  BP:  (!) 158/101 (!) 152/101   Pulse:  71 80 60  Resp:  '16 16 18  '$ Temp:  98.3 F (36.8 C) 97.8 F (36.6 C)   TempSrc:  Oral Oral   SpO2:  100% 100% 100%  Weight:  98 kg    Height: '5\' 10"'$  (1.778 m) '5\' 10"'$  (1.778 m)      Intake/Output Summary (Last 24 hours) at 01/24/2022 0935 Last data filed at 01/23/2022 1941 Gross per 24 hour  Intake 1000 ml  Output --  Net 1000 ml      01/24/2022    6:00 AM 01/23/2022    6:22 PM 08/27/2021    1:19 PM   Last 3 Weights  Weight (lbs) 216 lb 0.8 oz 210 lb 212 lb 6.4 oz  Weight (kg) 98 kg 95.255 kg 96.344 kg     Body mass index is 31 kg/m.  General:  Well nourished, well developed, in no acute distress HEENT: normal Neck: no JVD Vascular: No carotid bruits; Distal pulses 2+ bilaterally Cardiac:  normal S1, S2; IRRR; no murmur  Lungs:  clear to auscultation bilaterally, no wheezing, rhonchi or rales  Abd: soft, nontender, no hepatomegaly  Ext: no edema Musculoskeletal:  No deformities, BUE and BLE strength normal and equal Skin: warm and dry  Neuro:  CNs 2-12 intact, no focal abnormalities noted Psych:  Normal affect   EKG:  The EKG was personally reviewed and demonstrates: Atypical atrial flutter with a rapid ventricular response Telemetry:  Telemetry was personally reviewed and demonstrates: Normal sinus rhythm with sinus bradycardia, heart rates in the 30s and 40s, PACs and PVCs  Relevant CV Studies: None  Laboratory Data:  High Sensitivity Troponin:   Recent Labs  Lab 01/23/22 1915 01/23/22 2042  TROPONINIHS 36* 39*     Chemistry Recent Labs  Lab 01/23/22 1915 01/24/22 0722  NA 142 141  K 3.6 4.3  CL 111 109  CO2 23 22  GLUCOSE 95 115*  BUN 25* 20  CREATININE 1.44* 1.33*  CALCIUM 8.4* 8.8*  MG  --  2.0  GFRNONAA 51* 56*  ANIONGAP 8 10    Recent Labs  Lab 01/23/22 1915  PROT 6.4*  ALBUMIN 3.5  AST 21  ALT 17  ALKPHOS 70  BILITOT 1.2   Lipids No results  for input(s): "CHOL", "TRIG", "HDL", "LABVLDL", "LDLCALC", "CHOLHDL" in the last 168 hours.  Hematology Recent Labs  Lab 01/23/22 1915 01/24/22 0722  WBC 4.3 4.6  RBC 5.55 6.00*  HGB 12.3* 13.1  HCT 38.4* 41.5  MCV 69.2* 69.2*  MCH 22.2* 21.8*  MCHC 32.0 31.6  RDW 16.2* 16.2*  PLT 187 179   Thyroid No results for input(s): "TSH", "FREET4" in the last 168 hours.  BNP Recent Labs  Lab 01/23/22 1915  BNP 372.0*    DDimer No results for input(s): "DDIMER" in the last 168  hours.   Radiology/Studies:  DG Chest Portable 1 View  Result Date: 01/23/2022 CLINICAL DATA:  Dyspnea EXAM: PORTABLE CHEST 1 VIEW COMPARISON:  11/06/2019 FINDINGS: Stable mild cardiomegaly. No focal airspace consolidation, pleural effusion, or pneumothorax. No acute bony findings. IMPRESSION: No active disease. Electronically Signed   By: Davina Poke D.O.   On: 01/23/2022 18:49     Assessment and Plan:   Symptomatic tachybradycardia syndrome -despite medical therapy with beta-blockers and calcium channel blockers, the patient remains symptomatic.  I discussed the treatment options in detail.  His atypical left atrial flutter is not particularly good for catheter ablation.  There is no cure for his atrial arrhythmias.  Antiarrhythmic drug therapy would be fraught with worsening bradycardia.  The risk, goals, benefits, and expectations of dual-chamber pacemaker insertion were reviewed with the patient.  I recommended insertion of a dual-chamber pacemaker followed by initiation of antiarrhythmic drug therapy or up titration of his AV nodal blocking drugs. Hypertension -his blood pressure is reasonably well controlled.  No change in medical therapy. Anticoagulation -we will hold his Eliquis.  I would anticipate restarting several days after pacemaker insertion.   For questions or updates, please contact [place] Please consult [email or web address] for contact info under    Signed, Cristopher Peru, MD  01/24/2022 9:35 AM

## 2022-01-24 NOTE — Plan of Care (Signed)
  Problem: Clinical Measurements: Goal: Ability to maintain clinical measurements within normal limits will improve Outcome: Progressing Goal: Cardiovascular complication will be avoided Outcome: Progressing   

## 2022-01-24 NOTE — Plan of Care (Signed)

## 2022-01-24 NOTE — ED Provider Notes (Signed)
[facility] Provider Note  CSN: [id number] Arrival date & time: 01/23/22 1814  Chief Complaint(s) Tachycardia  HPI Joseph [name] is a 76 y.o. male with PMH paroxysmal A-fib on low-dose metoprolol, HTN, CHF, right bundle branch block, [facility] emergency department for evaluation of rapid heart rate.  Patient states that as a part of his normal routine when taking his medications at night he noticed his heart rate jumping as high as 130-140.  He spoke to his primary doctor who encouraged him to come to the emergency department for further evaluation.  Patient arrives with no complaints of chest pain, shortness of breath, [name] pain, nausea, vomiting, headache, fever or other systemic symptoms.  Patient arrives in atrial flutter with rapid atrial rate but slow ventricular rate.   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/2020   Non-recurrent unilateral inguinal hernia without obstruction or gangrene    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   History of adenomatous polyp of colon 02/27/2019   Paroxysmal atrial fibrillation ([name]) 07/18/2015   Bradycardia 06/29/2015   Near syncope    Atrial fibrillation with rapid ventricular response ([name]) 06/27/2015   Left ventricular diastolic dysfunction, NYHA class 3 10/26/2014   Kidney cysts 04/04/2013   HTN (hypertension) 03/31/2013    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 Authorizing Provider  alfuzosin (UROXATRAL) 10 MG 24 hr tablet Take 1 tablet (10 mg total) by mouth daily with breakfast. 09/23/21   McKenzie, Candee Furbish, MD  amLODipine (NORVASC) 10 MG tablet TAKE 1 TABLET(10 MG) BY MOUTH DAILY 03/13/21   Troy Sine, MD  apixaban (ELIQUIS) 5 MG TABS tablet Take 1 tablet (5 mg total) by mouth 2 (two) times daily. 07/18/15   Brett Canales, PA-C  cholecalciferol (VITAMIN D) 25 MCG (1000 UT) tablet Take 1,000 Units by mouth daily.    [provider]  Coenzyme Q10 (COQ10) 200 MG CAPS Take 200 mg by mouth daily.    [provider]  doxazosin (CARDURA) 8 MG tablet Take 8 mg by mouth at bedtime.     [provider]  hydrochlorothiazide (HYDRODIURIL) 25 MG tablet  05/13/20   [provider]  irbesartan (AVAPRO) 300 MG tablet TAKE 1 TABLET BY MOUTH DAILY 08/18/21   Troy Sine, MD  latanoprost (XALATAN) 0.005 % ophthalmic solution 1 drop at bedtime. 09/12/21   [provider]  metoprolol tartrate (LOPRESSOR) 25 MG tablet Take 1/2 tablet (12.'5mg'$ ) to 1 tablet ('25mg'$ ) by mouth as needed for elevated heart rate 10/31/20   Troy Sine, MD  minoxidil (LONITEN) 2.5 MG tablet Take 5 mg by mouth daily.     [provider]  Misc Natural Products (PROSTATE HEALTH PO) Take 1 capsule by mouth daily. Super Beta Prostate    [provider]  Multiple Vitamin (MULTIVITAMIN WITH MINERALS) TABS tablet Take 1 tablet by mouth daily.    [provider]  MYRBETRIQ 50 MG TB24 tablet TAKE 1 TABLET(50 MG) BY MOUTH DAILY 11/10/21   McKenzie, Candee Furbish, MD  Polyethyl Glycol-Propyl Glycol (LUBRICANT EYE DROPS) 0.4-0.3 % SOLN Place 1 drop into both eyes 3 (three) times daily as needed (dry/irritated eyes.).    [provider]   saw palmetto 500 MG capsule Take 500 mg by mouth every evening.    [provider]  solifenacin (VESICARE) 5 MG tablet TAKE 1 TABLET(5 MG) BY MOUTH DAILY 09/23/21   McKenzie, Candee Furbish, MD  spironolactone (ALDACTONE) 25 MG tablet TAKE 1/2 TABLET BY MOUTH EVERY DAY 04/28/21   Troy Sine, MD  Turmeric 500 MG CAPS Take 500 mg by mouth daily.    [provider]  vitamin B-12 (CYANOCOBALAMIN) 500 MCG tablet Take 500 mcg by mouth daily.    [provider]                                                                                                                                    Past Surgical History Past Surgical History:  Procedure Laterality Date   COLONOSCOPY     COLONOSCOPY  12/30/2011   Procedure: COLONOSCOPY;  Surgeon: [name], MD;  [facility];  Service: Endoscopy;  Laterality: N/A;  1:45PM   COLONOSCOPY N/A 04/19/2019   Procedure: COLONOSCOPY;  Surgeon: [name], MD;  [facility];  Service: Endoscopy;  Laterality: N/A;  2:00pm   EXCISION MASS LOWER EXTREMETIES Left 05/12/2018   Procedure: Left foot plantar fibroma excision;  Surgeon: [name], MD;  Location: [place];  Service: Orthopedics;  Laterality: Left;   FOOT SURGERY Left    SPERMATOCELECTOMY Right 11/13/2019   Procedure: EXCISION OF SPERMATIC CORD CYST;  Surgeon: [name], MD;  Location: [facility];  Service: Urology;  Laterality: Right;   VASECTOMY  11/13/2019   Procedure: VASECTOMY;  Surgeon: [name], MD;  Location: [facility];  Service: Urology;;   Family History Family History  Problem Relation Age of Onset   Cancer Brother    CVA Mother    Hypertension Father    Colon cancer Neg Hx     Social History Social History   Tobacco Use   Smoking status: Former    Packs/day: 0.00    Years: 4.00    Total pack years: 0.00    Types: Cigarettes    Quit date: 2000    Years since quitting: 23.5   Smokeless tobacco:  Never   Tobacco comments:    quit about 40 yrs ago  Vaping Use   Vaping Use: Never used  Substance Use Topics  Alcohol use: Yes    Alcohol/week: 0.0 standard drinks of alcohol    Comment: 6 pack of beer or less in a week   Drug use: No   Allergies Patient has no known allergies.  Review of Systems Review of Systems  All other systems reviewed and are negative.   Physical Exam Vital Signs  I have reviewed the triage vital signs BP (!) 124/94   Pulse (!) 49   Temp 98.4 F (36.9 C) (Oral)   Resp (!) 22   Ht '5\' 10"'$  (1.778 m)   Wt 95.3 kg   SpO2 100%   BMI 30.13 kg/m   Physical Exam Constitutional:      General: He is not in acute distress.    Appearance: Normal appearance.  HENT:     Head: Normocephalic and atraumatic.     Nose: No congestion or rhinorrhea.  Eyes:     General:        Right eye: No discharge.        Left eye: No discharge.     Extraocular Movements: Extraocular movements intact.     Pupils: Pupils are equal, round, and reactive to light.  Cardiovascular:     Rate and Rhythm: Tachycardia present. Rhythm irregular.     Heart sounds: No murmur heard. Pulmonary:     Effort: No respiratory distress.     Breath sounds: No wheezing or rales.  Abdominal:     General: There is no distension.     Tenderness: There is no abdominal tenderness.  Musculoskeletal:        General: Normal range of motion.     Cervical back: Normal range of motion.  Skin:    General: Skin is warm and dry.  Neurological:     General: No focal deficit present.     Mental Status: He is alert.     ED Results and Treatments Labs (all labs ordered are listed, but only abnormal results are displayed) Labs Reviewed  COMPREHENSIVE METABOLIC PANEL - Abnormal; Notable for the following components:      Result Value   BUN 25 (*)    Creatinine, Ser 1.44 (*)    Calcium 8.4 (*)    Total Protein 6.4 (*)    GFR, Estimated 51 (*)    All other components within normal limits  CBC  WITH DIFFERENTIAL/PLATELET - Abnormal; Notable for the following components:   Hemoglobin 12.3 (*)    HCT 38.4 (*)    MCV 69.2 (*)    MCH 22.2 (*)    RDW 16.2 (*)    All other components within normal limits  BRAIN NATRIURETIC PEPTIDE - Abnormal; Notable for the following components:   B Natriuretic Peptide 372.0 (*)    All other components within normal limits  TROPONIN I (HIGH SENSITIVITY) - Abnormal; Notable for the following components:   Troponin I (High Sensitivity) 36 (*)    All other components within normal limits  TROPONIN I (HIGH SENSITIVITY) - Abnormal; Notable for the following components:   Troponin I (High Sensitivity) 39 (*)    All other components within normal limits  Radiology DG Chest Portable 1 View  Result Date: 01/23/2022 CLINICAL DATA:  Dyspnea EXAM: PORTABLE CHEST 1 VIEW COMPARISON:  11/06/2019 FINDINGS: Stable mild cardiomegaly. No focal airspace consolidation, pleural effusion, or pneumothorax. No acute bony findings. IMPRESSION: No active disease. Electronically Signed   By: Davina Poke D.O.   On: 01/23/2022 18:49    Pertinent labs & imaging results that were available during my care of the patient were reviewed by me and considered in my medical decision making (see MDM for details).  Medications Ordered in ED Medications  0.9 %  sodium chloride infusion (0 mLs Intravenous Stopped 01/23/22 1941)                                                                                                                                     Procedures Procedures  (including critical care time)  Medical Decision Making / ED Course   This patient presents to the ED for concern of rapid heart rate, this involves an extensive number of treatment options, and is a complaint that carries with it a high risk of complications and morbidity.  The  differential diagnosis includes atrial flutter, junctional escape rhythm, tachybradycardia syndrome, electrolyte abnormality, dehydration  MDM: Patient seen emergency room for evaluation of rapid heart rate.  Physical exam is largely unremarkable outside of alternating irregular tachycardia versus bradycardia.  Laboratory evaluation with a hemoglobin of 12.3, creatinine 1.44, BUN 25, BNP elevated to 372 with a troponin of 36 and delta troponin 39.  Chest x-ray unremarkable.  Initial ECG with rapid atrial rate but slow ventricular rate that will intermittently change to a junctional escape rhythm.  I trialed fluid resuscitation to see if the patient would spontaneously convert as giving additional rate control medications make because the patient to have significant symptomatic bradycardia.  This unfortunate was unsuccessful.  I spoke with the cardiology resident on-call Dr. Conley Canal who recommends admission to the cardiology service and transfer to [facility] for possible EP consultation given possible need for pacemaker or ablation.  Patient then admitted to the cardiology service at [facility].   Additional history obtained: -Additional history obtained from wife -External records from outside source obtained and reviewed including: Chart review including previous notes, labs, imaging, consultation notes   Lab Tests: -I ordered, reviewed, and interpreted labs.   The pertinent results include:   Labs Reviewed  COMPREHENSIVE METABOLIC PANEL - Abnormal; Notable for the following components:      Result Value   BUN 25 (*)    Creatinine, Ser 1.44 (*)    Calcium 8.4 (*)    Total Protein 6.4 (*)    GFR, Estimated 51 (*)    All other components within normal limits  CBC WITH DIFFERENTIAL/PLATELET - Abnormal; Notable for the following components:   Hemoglobin 12.3 (*)    HCT 38.4 (*)    MCV 69.2 (*)    MCH 22.2 (*)  RDW 16.2 (*)    All other components within normal limits  BRAIN NATRIURETIC  PEPTIDE - Abnormal; Notable for the following components:   B Natriuretic Peptide 372.0 (*)    All other components within normal limits  TROPONIN I (HIGH SENSITIVITY) - Abnormal; Notable for the following components:   Troponin I (High Sensitivity) 36 (*)    All other components within normal limits  TROPONIN I (HIGH SENSITIVITY) - Abnormal; Notable for the following components:   Troponin I (High Sensitivity) 39 (*)    All other components within normal limits      EKG   EKG Interpretation  Date/Time:  Friday January 23 2022 20:27:53 EDT Ventricular Rate:  45 PR Interval:  245 QRS Duration: 177 QT Interval:  449 QTC Calculation: 389 R Axis:   -68 Text Interpretation: junctional escape rhythm Multiple premature complexes, vent & supraven Prolonged PR interval RBBB and LAFB Left ventricular hypertrophy Confirmed by [place] ([id number]) on 01/24/2022 12:09:11 AM         Imaging Studies ordered: I ordered imaging studies including chest x-ray I independently visualized and interpreted imaging. I agree with the radiologist interpretation   Medicines ordered and prescription drug management: Meds ordered this encounter  Medications   DISCONTD: lactated ringers bolus 1,000 mL   0.9 %  sodium chloride infusion    -I have reviewed the patients home medicines and have made adjustments as needed  Critical interventions none  Consultations Obtained: I requested consultation with the cardiologist Dr. Conley Canal,  and discussed lab and imaging findings as well as pertinent plan - they recommend: Admission to [facility]   Cardiac Monitoring: The patient was maintained on a cardiac monitor.  I personally viewed and interpreted the cardiac monitored which showed an underlying rhythm of: A flutter, junctional escape rhythm  Social Determinants of Health:  Factors impacting patients care include: none   Reevaluation: After the interventions noted above, I reevaluated the patient  and found that they have :stayed the same  Co morbidities that complicate the patient evaluation  Past Medical History:  Diagnosis Date   1st degree AV block    Atrial fibrillation (HCC)    Diastolic dysfunction 8/91   grade 1   Hypertension    New onset atrial fibrillation ([place]) 06/2015   RBBB    Sinus bradycardia    Stroke ([place])    no defecits, pt denies having a stroke, says [name] diagnosed this      Dispostion: I considered admission for this patient, and given persistent ectopy and slow atrial flutter, patient will require cardiology admission     Final Clinical Impression(s) / ED Diagnoses Final diagnoses:  None     '@PCDICTATION'$ @    Teressa Lower, MD 01/24/22 0010

## 2022-01-24 NOTE — H&P (Signed)
Cardiology Admission History and Physical:   Patient ID: Joseph [name] MRN: [id number]; DOB: [birth year]   Admission date: 01/23/2022  PCP:  Lemmie Evens, MD   [facility] Providers Cardiologist:  Shelva Majestic, MD        Chief Complaint:  "Wooziness"  Patient Profile:   Joseph [name] is a 76 y.o. male with Afib/Aflutter (on Apixaban), HTN, sinus bradycardia, BPH, prior CVA and cognitive impairment? who is being seen 01/24/2022 for the evaluation of Afib and bradycardia.  History of Present Illness:   Mr. Prout reports that yesterday at ~5 pm he started feeling "woozy". When asked to qualify this feeling, he denies feeling lightheaded, but instead just felt "unusual'. He reportedly called his cardiologist regarding his symptoms and was instructed to come to the ED. He denies having chest pain, SOB, PND, orthopnea, palpitations, nausea, vomiting, abdominal pain, syncope, presyncope, or fevers. He initially arrived to the [facility] ED where he was found to be in Afib with heart rates up to the 130-140s. His heart rate would intermittently drop down to the 30-40s however without intervention. I was called regarding what to do with this patient's rhythm and elected to have him transferred to [facility] for EP evaluation.  Of note the patient has longstanding bradycardia. He is prescribed metoprolol 12.5 mg daily prn for tachycardia but has not taken it in months per his report. He takes Apixaban for stroke prevention, but endorses missing a few doses in the past 1 month. He has no other complaints.  On admission his VS were afebrile, HR 30s-130s, BP 158/101, RR 16 and satting 100% on RA. Nothing particularly notable on labs or CXR. He was asymptomatic when I spoke with him.    Past Medical History:  Diagnosis Date   1st degree AV block    Atrial fibrillation (HCC)    Diastolic dysfunction 9/98   grade 1   Hypertension    New onset atrial fibrillation ([place]) 06/2015   RBBB     Sinus bradycardia    Stroke ([place])    no defecits, pt denies having a stroke, says [name] diagnosed this    Past Surgical History:  Procedure Laterality Date   COLONOSCOPY     COLONOSCOPY  12/30/2011   Procedure: COLONOSCOPY;  Surgeon: [name], MD;  [facility];  Service: Endoscopy;  Laterality: N/A;  1:45PM   COLONOSCOPY N/A 04/19/2019   Procedure: COLONOSCOPY;  Surgeon: [name], MD;  [facility];  Service: Endoscopy;  Laterality: N/A;  2:00pm   EXCISION MASS LOWER EXTREMETIES Left 05/12/2018   Procedure: Left foot plantar fibroma excision;  Surgeon: [name], MD;  Location: [place];  Service: Orthopedics;  Laterality: Left;   FOOT SURGERY Left    SPERMATOCELECTOMY Right 11/13/2019   Procedure: EXCISION OF SPERMATIC CORD CYST;  Surgeon: [name], MD;  Location: [facility];  Service: Urology;  Laterality: Right;   VASECTOMY  11/13/2019   Procedure: VASECTOMY;  Surgeon: [name], MD;  Location: [facility];  Service: Urology;;     Medications Prior to Admission: Prior to Admission medications   Medication Sig Start Date End Date Taking? Authorizing Provider  alfuzosin (UROXATRAL) 10 MG 24 hr tablet Take 1 tablet (10 mg total) by mouth daily with breakfast. 09/23/21   McKenzie, Candee Furbish, MD  amLODipine (NORVASC) 10 MG tablet TAKE 1 TABLET(10 MG) BY MOUTH DAILY 03/13/21   Troy Sine, MD  apixaban Arne Cleveland) 5  MG TABS tablet Take 1 tablet (5 mg total) by mouth 2 (two) times daily. 07/18/15   Brett Canales, PA-C  cholecalciferol (VITAMIN D) 25 MCG (1000 UT) tablet Take 1,000 Units by mouth daily.    [provider]  Coenzyme Q10 (COQ10) 200 MG CAPS Take 200 mg by mouth daily.    [provider]  doxazosin (CARDURA) 8 MG tablet Take 8 mg by mouth at bedtime.     [provider]  hydrochlorothiazide (HYDRODIURIL) 25 MG tablet  05/13/20   [provider]  irbesartan (AVAPRO) 300 MG tablet  TAKE 1 TABLET BY MOUTH DAILY 08/18/21   Troy Sine, MD  latanoprost (XALATAN) 0.005 % ophthalmic solution 1 drop at bedtime. 09/12/21   [provider]  metoprolol tartrate (LOPRESSOR) 25 MG tablet Take 1/2 tablet (12.'5mg'$ ) to 1 tablet ('25mg'$ ) by mouth as needed for elevated heart rate 10/31/20   Troy Sine, MD  minoxidil (LONITEN) 2.5 MG tablet Take 5 mg by mouth daily.     [provider]  Misc Natural Products (PROSTATE HEALTH PO) Take 1 capsule by mouth daily. Super Beta Prostate    [provider]  Multiple Vitamin (MULTIVITAMIN WITH MINERALS) TABS tablet Take 1 tablet by mouth daily.    [provider]  MYRBETRIQ 50 MG TB24 tablet TAKE 1 TABLET(50 MG) BY MOUTH DAILY 11/10/21   McKenzie, Candee Furbish, MD  Polyethyl Glycol-Propyl Glycol (LUBRICANT EYE DROPS) 0.4-0.3 % SOLN Place 1 drop into both eyes 3 (three) times daily as needed (dry/irritated eyes.).    [provider]  saw palmetto 500 MG capsule Take 500 mg by mouth every evening.    [provider]  solifenacin (VESICARE) 5 MG tablet TAKE 1 TABLET(5 MG) BY MOUTH DAILY 09/23/21   McKenzie, Candee Furbish, MD  spironolactone (ALDACTONE) 25 MG tablet TAKE 1/2 TABLET BY MOUTH EVERY DAY 04/28/21   Troy Sine, MD  Turmeric 500 MG CAPS Take 500 mg by mouth daily.    [provider]  vitamin B-12 (CYANOCOBALAMIN) 500 MCG tablet Take 500 mcg by mouth daily.    [provider]     Allergies:   No Known Allergies  Social History:   Social History   Socioeconomic History   Marital status: Married    Spouse name: Not on file   Number of children: 5   Years of education: Not on file   Highest education level: Not on file  Occupational History   Not on file  Tobacco Use   Smoking status: Former    Packs/day: 0.00    Years: 4.00    Total pack years: 0.00    Types: Cigarettes    Quit date: 2000    Years since quitting: 23.5   Smokeless tobacco: Never   Tobacco  comments:    quit about 40 yrs ago  Vaping Use   Vaping Use: Never used  Substance and Sexual Activity   Alcohol use: Yes    Alcohol/week: 0.0 standard drinks of alcohol    Comment: 6 pack of beer or less in a week   Drug use: No   Sexual activity: Not on file  Other Topics Concern   Not on file  Social History Narrative   Not on file   Social Determinants of Health   Financial Resource Strain: Not on file  Food Insecurity: Not on file  Transportation Needs: Not on file  Physical Activity: Not on file  Stress: Not on  file  Social Connections: Not on file  Intimate Partner Violence: Not on file    Family History:   The patient's family history includes CVA in his mother; Cancer in his brother; Hypertension in his father. There is no history of Colon cancer.    ROS:  Please see the history of present illness.  All other ROS reviewed and negative.     Physical Exam/Data:   Vitals:   01/24/22 0430 01/24/22 0431 01/24/22 0552 01/24/22 0600  BP: 140/71   (!) 158/101  Pulse: (!) 36   71  Resp: 19   16  Temp:  97.9 F (36.6 C)  98.3 F (36.8 C)  TempSrc:  Oral  Oral  SpO2: 100%   100%  Weight:    98 kg  Height:   '5\' 10"'$  (1.778 m) '5\' 10"'$  (1.778 m)    Intake/Output Summary (Last 24 hours) at 01/24/2022 0654 Last data filed at 01/23/2022 1941 Gross per 24 hour  Intake 1000 ml  Output --  Net 1000 ml      01/24/2022    6:00 AM 01/23/2022    6:22 PM 08/27/2021    1:19 PM  Last 3 Weights  Weight (lbs) 216 lb 0.8 oz 210 lb 212 lb 6.4 oz  Weight (kg) 98 kg 95.255 kg 96.344 kg     Body mass index is 31 kg/m.  General:  Well nourished, well developed, in no acute distress HEENT: normal Neck: no JVD Vascular: No carotid bruits; Distal pulses 2+ bilaterally   Cardiac:  normal S1, S2; RRR; no murmur  Lungs:  clear to auscultation bilaterally, no wheezing, rhonchi or rales  Abd: soft, nontender, no hepatomegaly  Ext: no edema Musculoskeletal:  No deformities, BUE and  BLE strength normal and equal Skin: warm and dry  Neuro:  CNs 2-12 intact, no focal abnormalities noted Psych:  Normal affect    EKG:  The ECGs that were done was personally reviewed and demonstrates Afib with RVR, Aflutter with variable block, junctional escape rhythm   Relevant CV Studies:  TTE 08/13/20:  IMPRESSIONS     1. Left ventricular ejection fraction, by estimation, is 55 to 60%. The  left ventricle has normal function. The left ventricle has no regional  wall motion abnormalities. There is moderate left ventricular hypertrophy.  Left ventricular diastolic  parameters are consistent with Grade I diastolic dysfunction (impaired  relaxation).   2. Right ventricular systolic function is normal. The right ventricular  size is normal. There is mildly elevated pulmonary artery systolic  pressure.   3. Left atrial size was moderately dilated.   4. The mitral valve is abnormal. Trivial mitral valve regurgitation.   5. The aortic valve is tricuspid. Aortic valve regurgitation is not  visualized.   6. The inferior vena cava is normal in size with greater than 50%  respiratory variability, suggesting right atrial pressure of 3 mmHg.   Laboratory Data:  High Sensitivity Troponin:   Recent Labs  Lab 01/23/22 1915 01/23/22 2042  TROPONINIHS 36* 39*      Chemistry Recent Labs  Lab 01/23/22 1915  NA 142  K 3.6  CL 111  CO2 23  GLUCOSE 95  BUN 25*  CREATININE 1.44*  CALCIUM 8.4*  GFRNONAA 51*  ANIONGAP 8    Recent Labs  Lab 01/23/22 1915  PROT 6.4*  ALBUMIN 3.5  AST 21  ALT 17  ALKPHOS 70  BILITOT 1.2   Lipids No results for input(s): "CHOL", "TRIG", "HDL", "LABVLDL", "  [place]", "CHOLHDL" in the last 168 hours. Hematology Recent Labs  Lab 01/23/22 1915  WBC 4.3  RBC 5.55  HGB 12.3*  HCT 38.4*  MCV 69.2*  MCH 22.2*  MCHC 32.0  RDW 16.2*  PLT 187   Thyroid No results for input(s): "TSH", "FREET4" in the last 168 hours. BNP Recent Labs  Lab  01/23/22 1915  BNP 372.0*    DDimer No results for input(s): "DDIMER" in the last 168 hours.   Radiology/Studies:  DG Chest Portable 1 View  Result Date: 01/23/2022 CLINICAL DATA:  Dyspnea EXAM: PORTABLE CHEST 1 VIEW COMPARISON:  11/06/2019 FINDINGS: Stable mild cardiomegaly. No focal airspace consolidation, pleural effusion, or pneumothorax. No acute bony findings. IMPRESSION: No active disease. Electronically Signed   By: Davina Poke D.O.   On: 01/23/2022 18:49     Assessment and Plan:   NORA ROOKE is a 76 y.o. male with Afib/Aflutter (on Apixaban), HTN, sinus bradycardia, BPH, prior CVA and cognitive impairment? who is being seen 01/24/2022 for the evaluation of Afib and bradycardia.  #Tachy-Brady Syndrome #Afib/Aflutter ::Patient presenting with non-specific symptoms found to have intermittent Aflutter and Afib with RVR coupled with profound bradycardia. 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 Ultimately I think it will be challenging to manage his rhythm issues with rate control in the setting of his SND. He may require a PPM in order to be able to adequately rate control him. Alternatively, ablation vs AAD therapy may be options for him as well. I would appreciate the help of EP to determine what the best course of action is for managing this patient. -Consult with EP regarding need for PPM vs AAD therapy vs ablation -TTE to assess EF -continue apixaban 5 mg BID -hold home prn metoprolol given bradycardia -maintain telemetry  #HTN -continue amlodipine, irbesartan and spironolactone  #BPH -continue doxazosin -continue mirabegron   Risk Assessment/Risk Scores:         CHA2DS2-VASc Score = 3   This indicates a 3.2% annual risk of stroke. The patient's score is based upon: CHF History: 0 HTN History: 1 Diabetes History: 0 Stroke History: 0 Vascular Disease History: 1 Age Score: 1 Gender Score: 0      Severity of  Illness: The appropriate patient status for this patient is INPATIENT. Inpatient status is judged to be reasonable and necessary in order to provide the required intensity of service to ensure the patient's safety. The patient's presenting symptoms, physical exam findings, and initial radiographic and laboratory data in the context of their chronic comorbidities is felt to place them at high risk for further clinical deterioration. Furthermore, it is not anticipated that the patient will be medically stable for discharge from the hospital within 2 midnights of admission.   * I certify that at the point of admission it is my clinical judgment that the patient will require inpatient hospital care spanning beyond 2 midnights from the point of admission due to high intensity of service, high risk for further deterioration and high frequency of surveillance required.*   For questions or updates, please contact [place] Please consult [email or web address] for contact info under     Signed, Hershal Coria, MD  01/24/2022 6:54 AM

## 2022-01-25 DIAGNOSIS — I484 Atypical atrial flutter: Secondary | ICD-10-CM | POA: Diagnosis not present

## 2022-01-25 DIAGNOSIS — I4891 Unspecified atrial fibrillation: Secondary | ICD-10-CM | POA: Diagnosis not present

## 2022-01-25 LAB — SURGICAL PCR SCREEN
MRSA, PCR: NEGATIVE
Staphylococcus aureus: NEGATIVE

## 2022-01-25 MED ORDER — CEFAZOLIN SODIUM-DEXTROSE 2-4 GM/100ML-% IV SOLN
2.0000 g | INTRAVENOUS | Status: AC
  Administered 2022-01-26: 2 g via INTRAVENOUS
  Filled 2022-01-25: qty 100

## 2022-01-25 MED ORDER — SODIUM CHLORIDE 0.9 % IV SOLN: INTRAVENOUS | Status: DC

## 2022-01-25 MED ORDER — SODIUM CHLORIDE 0.9 % WEIGHT BASED INFUSION: 3.0000 mL/kg/h | INTRAVENOUS | Status: DC

## 2022-01-25 MED ORDER — SODIUM CHLORIDE 0.9 % IV SOLN: 250.0000 mL | INTRAVENOUS | Status: DC | PRN

## 2022-01-25 MED ORDER — SODIUM CHLORIDE 0.9% FLUSH: 3.0000 mL | INTRAVENOUS | Status: DC | PRN

## 2022-01-25 MED ORDER — SODIUM CHLORIDE 0.9% FLUSH
3.0000 mL | Freq: Two times a day (BID) | INTRAVENOUS | Status: DC
  Administered 2022-01-25 – 2022-01-26 (×2): 3 mL via INTRAVENOUS

## 2022-01-25 MED ORDER — CEFAZOLIN SODIUM-DEXTROSE 2-4 GM/100ML-% IV SOLN
2.0000 g | INTRAVENOUS | Status: DC
  Filled 2022-01-25: qty 100

## 2022-01-25 MED ORDER — SODIUM CHLORIDE 0.9 % IV SOLN
80.0000 mg | INTRAVENOUS | Status: DC
  Filled 2022-01-25: qty 2

## 2022-01-25 MED ORDER — LATANOPROST 0.005 % OP SOLN
1.0000 [drp] | Freq: Every day | OPHTHALMIC | Status: DC
  Administered 2022-01-25 – 2022-01-26 (×2): 1 [drp] via OPHTHALMIC
  Filled 2022-01-25: qty 2.5

## 2022-01-25 MED ORDER — SODIUM CHLORIDE 0.9 % WEIGHT BASED INFUSION: 1.0000 mL/kg/h | INTRAVENOUS | Status: DC

## 2022-01-25 MED ORDER — ASPIRIN 81 MG PO CHEW
81.0000 mg | CHEWABLE_TABLET | ORAL | Status: AC
  Administered 2022-01-26: 81 mg via ORAL
  Filled 2022-01-25: qty 1

## 2022-01-25 MED ORDER — SODIUM CHLORIDE 0.9 % IV SOLN
80.0000 mg | INTRAVENOUS | Status: AC
  Administered 2022-01-26: 80 mg
  Filled 2022-01-25: qty 2

## 2022-01-25 MED ORDER — CLONIDINE HCL 0.2 MG PO TABS
0.3000 mg | ORAL_TABLET | Freq: Two times a day (BID) | ORAL | Status: DC
  Administered 2022-01-25 (×2): 0.3 mg via ORAL
  Filled 2022-01-25 (×2): qty 1

## 2022-01-25 NOTE — Progress Notes (Signed)
Progress Note  Patient Name: BANDON SHERWIN Date of Encounter: 01/25/2022  Primary Cardiologist: Shelva Majestic, MD   Subjective   No chest pain or sob.   Inpatient Medications    Scheduled Meds:  amLODipine  10 mg Oral Daily   cholecalciferol  1,000 Units Oral Daily   cloNIDine  0.3 mg Oral BID   doxazosin  8 mg Oral QHS   irbesartan  300 mg Oral Daily   latanoprost  1 drop Both Eyes QHS   mirabegron ER  50 mg Oral Daily   multivitamin with minerals  1 tablet Oral Daily   spironolactone  12.5 mg Oral Daily   vitamin B-12  500 mcg Oral Daily   Continuous Infusions:  PRN Meds: acetaminophen, polyvinyl alcohol   Vital Signs    Vitals:   01/24/22 2050 01/25/22 0139 01/25/22 0338 01/25/22 0700  BP: (!) 144/85 (!) 148/74 131/89 (!) 154/99  Pulse: 63     Resp: '16  17 16  '$ Temp: 98 F (36.7 C) 97.8 F (36.6 C)  98 F (36.7 C)  TempSrc: Axillary   Oral  SpO2: 99%  98%   Weight:      Height:        Intake/Output Summary (Last 24 hours) at 01/25/2022 1056 Last data filed at 01/25/2022 0000 Gross per 24 hour  Intake 550 ml  Output 550 ml  Net 0 ml   Filed Weights   01/23/22 1822 01/24/22 0600  Weight: 95.3 kg 98 kg    Telemetry    Atypical atrial flutter with a CVR - Personally Reviewed  ECG    none - Personally Reviewed  Physical Exam   GEN: No acute distress.   Neck: No JVD Cardiac: IRRR, no murmurs, rubs, or gallops.  Respiratory: Clear to auscultation bilaterally. GI: Soft, nontender, non-distended  MS: No edema; No deformity. Neuro:  Nonfocal  Psych: Normal affect   Labs    Chemistry Recent Labs  Lab 01/23/22 1915 01/24/22 0722  NA 142 141  K 3.6 4.3  CL 111 109  CO2 23 22  GLUCOSE 95 115*  BUN 25* 20  CREATININE 1.44* 1.33*  CALCIUM 8.4* 8.8*  PROT 6.4*  --   ALBUMIN 3.5  --   AST 21  --   ALT 17  --   ALKPHOS 70  --   BILITOT 1.2  --   GFRNONAA 51* 56*  ANIONGAP 8 10     Hematology Recent Labs  Lab 01/23/22 1915  01/24/22 0722  WBC 4.3 4.6  RBC 5.55 6.00*  HGB 12.3* 13.1  HCT 38.4* 41.5  MCV 69.2* 69.2*  MCH 22.2* 21.8*  MCHC 32.0 31.6  RDW 16.2* 16.2*  PLT 187 179    Cardiac EnzymesNo results for input(s): "TROPONINI" in the last 168 hours. No results for input(s): "TROPIPOC" in the last 168 hours.   BNP Recent Labs  Lab 01/23/22 1915  BNP 372.0*     DDimer No results for input(s): "DDIMER" in the last 168 hours.   Radiology    ECHOCARDIOGRAM COMPLETE  Result Date: 01/24/2022    ECHOCARDIOGRAM REPORT   Patient Name:   LUISMARIO COSTON [facility] Date of Exam: 01/24/2022 Medical Rec #:  [id number]         Height:       70.0 in Accession #:    [id number]        Weight:       216.0 lb Date of Birth:  Jan 22, 1946  BSA:          2.157 m Patient Age:    76 years          BP:           149/71 mmHg Patient Gender: M                 HR:           51 bpm. Exam Location:  Inpatient Procedure: 2D Echo Indications:    Atrial fibrillation  History:        Patient has prior history of Echocardiogram examinations, most                 recent 08/13/2020. Abnormal ECG, Arrythmias:Atrial Fibrillation,                 Atrial Flutter and RBBB; Risk Factors:Hypertension.  Sonographer:    Johny Chess RDCS Referring Phys: [id number] [place]  1. Left ventricular ejection fraction, by estimation, is 35 to 40%. The left ventricle has moderately decreased function. The left ventricle demonstrates global hypokinesis. The left ventricular internal cavity size was mildly dilated. Left ventricular diastolic function could not be evaluated. There is moderate hypokinesis of the left ventricular, mid-apical inferior wall, inferolateral wall and anterolateral wall.  2. Right ventricular systolic function is normal. The right ventricular size is normal.  3. Left atrial size was moderately dilated.  4. The mitral valve is normal in structure. Trivial mitral valve regurgitation. No evidence of mitral stenosis.  5. The  aortic valve is tricuspid. There is mild calcification of the aortic valve. There is mild thickening of the aortic valve. Aortic valve regurgitation is not visualized. Aortic valve sclerosis/calcification is present, without any evidence of aortic stenosis.  6. The inferior vena cava is dilated in size with <50% respiratory variability, suggesting right atrial pressure of 15 mmHg. Comparison(s): Changes from prior study are noted. Conclusion(s)/Recommendation(s): Since prior echo, both global hypokinesis and focal wall motion abnormalities of the LV are present. Most prominent WMA are in the mid to apical inferior and lateral wall. FINDINGS  Left Ventricle: Left ventricular ejection fraction, by estimation, is 35 to 40%. The left ventricle has moderately decreased function. The left ventricle demonstrates global hypokinesis. Moderate hypokinesis of the left ventricular, mid-apical inferior wall, inferolateral wall and anterolateral wall. The left ventricular internal cavity size was mildly dilated. There is borderline left ventricular hypertrophy. Left ventricular diastolic function could not be evaluated due to nondiagnostic images. Left ventricular diastolic function could not be evaluated. Right Ventricle: The right ventricular size is normal. No increase in right ventricular wall thickness. Right ventricular systolic function is normal. Left Atrium: Left atrial size was moderately dilated. Right Atrium: Right atrial size was normal in size. Pericardium: There is no evidence of pericardial effusion. Mitral Valve: The mitral valve is normal in structure. Trivial mitral valve regurgitation. No evidence of mitral valve stenosis. Tricuspid Valve: The tricuspid valve is normal in structure. Tricuspid valve regurgitation is trivial. No evidence of tricuspid stenosis. Aortic Valve: The aortic valve is tricuspid. There is mild calcification of the aortic valve. There is mild thickening of the aortic valve. Aortic valve  regurgitation is not visualized. Aortic valve sclerosis/calcification is present, without any evidence of aortic stenosis. Pulmonic Valve: The pulmonic valve was grossly normal. Pulmonic valve regurgitation is trivial. No evidence of pulmonic stenosis. Aorta: Aortic root could not be assessed and the aortic root, ascending aorta, aortic arch and descending aorta are all structurally normal, with  no evidence of dilitation or obstruction. Venous: The inferior vena cava is dilated in size with less than 50% respiratory variability, suggesting right atrial pressure of 15 mmHg. IAS/Shunts: The atrial septum is grossly normal.  LEFT VENTRICLE PLAX 2D LVIDd:         5.90 cm LVIDs:         4.80 cm LV PW:         1.20 cm LV IVS:        1.10 cm LVOT diam:     1.90 cm LVOT Area:     2.84 cm  RIGHT VENTRICLE             IVC RV S prime:     11.30 cm/s  IVC diam: 2.40 cm TAPSE (M-mode): 2.6 cm LEFT ATRIUM           Index        RIGHT ATRIUM           Index LA diam:      3.60 cm 1.67 cm/m   RA Area:     18.80 cm LA Vol (A4C): 99.6 ml 46.18 ml/m  RA Volume:   49.80 ml  23.09 ml/m   AORTA Ao Root diam: 2.90 cm Ao Asc diam:  3.20 cm  SHUNTS Systemic Diam: 1.90 cm Buford Dresser MD Electronically signed by Buford Dresser MD Signature Date/Time: 01/24/2022/5:52:50 PM    Final    DG Chest Portable 1 View  Result Date: 01/23/2022 CLINICAL DATA:  Dyspnea EXAM: PORTABLE CHEST 1 VIEW COMPARISON:  11/06/2019 FINDINGS: Stable mild cardiomegaly. No focal airspace consolidation, pleural effusion, or pneumothorax. No acute bony findings. IMPRESSION: No active disease. Electronically Signed   By: Davina Poke D.O.   On: 01/23/2022 18:49    Cardiac Studies   See above echo  Patient Profile     76 y.o. male admitted with symptomatic tachy-brady syndrome, now with LV dysfunction  Assessment & Plan    Tachy-brady syndrome - his rates are controlled. He will undergo PPM tomorrow. New LV dysfunction - he will  undergo left heart cath due to newly diagnosed LV dysfunction. GDMT will be prescribed. Atrial fib/flutter with a RVR - I would invision the use of AA drug therapy, likely amiodarone once above sorted out. HTN - he will be treated with coreg and entresto with plans to dc amlodipine, clonidine and irbesartan.  For questions or updates, please contact [place] Please consult [email or web address] for contact info under Cardiology/STEMI.      Signed, Cristopher Peru, MD  01/25/2022, 10:56 AM

## 2022-01-25 NOTE — H&P (View-Only) (Signed)
Progress Note  Patient Name: Joseph [name] Date of Encounter: 01/25/2022  Primary Cardiologist: Shelva Majestic, MD   Subjective   No chest pain or sob.   Inpatient Medications    Scheduled Meds:  amLODipine  10 mg Oral Daily   cholecalciferol  1,000 Units Oral Daily   cloNIDine  0.3 mg Oral BID   doxazosin  8 mg Oral QHS   irbesartan  300 mg Oral Daily   latanoprost  1 drop Both Eyes QHS   mirabegron ER  50 mg Oral Daily   multivitamin with minerals  1 tablet Oral Daily   spironolactone  12.5 mg Oral Daily   vitamin B-12  500 mcg Oral Daily   Continuous Infusions:  PRN Meds: acetaminophen, polyvinyl alcohol   Vital Signs    Vitals:   01/24/22 2050 01/25/22 0139 01/25/22 0338 01/25/22 0700  BP: (!) 144/85 (!) 148/74 131/89 (!) 154/99  Pulse: 63     Resp: '16  17 16  '$ Temp: 98 F (36.7 C) 97.8 F (36.6 C)  98 F (36.7 C)  TempSrc: Axillary   Oral  SpO2: 99%  98%   Weight:      Height:        Intake/Output Summary (Last 24 hours) at 01/25/2022 1056 Last data filed at 01/25/2022 0000 Gross per 24 hour  Intake 550 ml  Output 550 ml  Net 0 ml   Filed Weights   01/23/22 1822 01/24/22 0600  Weight: 95.3 kg 98 kg    Telemetry    Atypical atrial flutter with a CVR - Personally Reviewed  ECG    none - Personally Reviewed  Physical Exam   GEN: No acute distress.   Neck: No JVD Cardiac: IRRR, no murmurs, rubs, or gallops.  Respiratory: Clear to auscultation bilaterally. GI: Soft, nontender, non-distended  MS: No edema; No deformity. Neuro:  Nonfocal  Psych: Normal affect   Labs    Chemistry Recent Labs  Lab 01/23/22 1915 01/24/22 0722  NA 142 141  K 3.6 4.3  CL 111 109  CO2 23 22  GLUCOSE 95 115*  BUN 25* 20  CREATININE 1.44* 1.33*  CALCIUM 8.4* 8.8*  PROT 6.4*  --   ALBUMIN 3.5  --   AST 21  --   ALT 17  --   ALKPHOS 70  --   BILITOT 1.2  --   GFRNONAA 51* 56*  ANIONGAP 8 10     Hematology Recent Labs  Lab 01/23/22 1915  01/24/22 0722  WBC 4.3 4.6  RBC 5.55 6.00*  HGB 12.3* 13.1  HCT 38.4* 41.5  MCV 69.2* 69.2*  MCH 22.2* 21.8*  MCHC 32.0 31.6  RDW 16.2* 16.2*  PLT 187 179    Cardiac EnzymesNo results for input(s): "TROPONINI" in the last 168 hours. No results for input(s): "TROPIPOC" in the last 168 hours.   BNP Recent Labs  Lab 01/23/22 1915  BNP 372.0*     DDimer No results for input(s): "DDIMER" in the last 168 hours.   Radiology    ECHOCARDIOGRAM COMPLETE  Result Date: 01/24/2022    ECHOCARDIOGRAM REPORT   Patient Name:   Joseph [name] [facility] Date of Exam: 01/24/2022 Medical Rec #:  [id number]         Height:       70.0 in Accession #:    [id number]        Weight:       216.0 lb Date of Birth:  [birth year]  BSA:          2.157 m Patient Age:    76 years          BP:           149/71 mmHg Patient Gender: M                 HR:           51 bpm. Exam Location:  Inpatient Procedure: 2D Echo Indications:    Atrial fibrillation  History:        Patient has prior history of Echocardiogram examinations, most                 recent 08/13/2020. Abnormal ECG, Arrythmias:Atrial Fibrillation,                 Atrial Flutter and RBBB; Risk Factors:Hypertension.  Sonographer:    Johny Chess RDCS Referring Phys: [id number] [place]  1. Left ventricular ejection fraction, by estimation, is 35 to 40%. The left ventricle has moderately decreased function. The left ventricle demonstrates global hypokinesis. The left ventricular internal cavity size was mildly dilated. Left ventricular diastolic function could not be evaluated. There is moderate hypokinesis of the left ventricular, mid-apical inferior wall, inferolateral wall and anterolateral wall.  2. Right ventricular systolic function is normal. The right ventricular size is normal.  3. Left atrial size was moderately dilated.  4. The mitral valve is normal in structure. Trivial mitral valve regurgitation. No evidence of mitral stenosis.  5. The  aortic valve is tricuspid. There is mild calcification of the aortic valve. There is mild thickening of the aortic valve. Aortic valve regurgitation is not visualized. Aortic valve sclerosis/calcification is present, without any evidence of aortic stenosis.  6. The inferior vena cava is dilated in size with <50% respiratory variability, suggesting right atrial pressure of 15 mmHg. Comparison(s): Changes from prior study are noted. Conclusion(s)/Recommendation(s): Since prior echo, both global hypokinesis and focal wall motion abnormalities of the LV are present. Most prominent WMA are in the mid to apical inferior and lateral wall. FINDINGS  Left Ventricle: Left ventricular ejection fraction, by estimation, is 35 to 40%. The left ventricle has moderately decreased function. The left ventricle demonstrates global hypokinesis. Moderate hypokinesis of the left ventricular, mid-apical inferior wall, inferolateral wall and anterolateral wall. The left ventricular internal cavity size was mildly dilated. There is borderline left ventricular hypertrophy. Left ventricular diastolic function could not be evaluated due to nondiagnostic images. Left ventricular diastolic function could not be evaluated. Right Ventricle: The right ventricular size is normal. No increase in right ventricular wall thickness. Right ventricular systolic function is normal. Left Atrium: Left atrial size was moderately dilated. Right Atrium: Right atrial size was normal in size. Pericardium: There is no evidence of pericardial effusion. Mitral Valve: The mitral valve is normal in structure. Trivial mitral valve regurgitation. No evidence of mitral valve stenosis. Tricuspid Valve: The tricuspid valve is normal in structure. Tricuspid valve regurgitation is trivial. No evidence of tricuspid stenosis. Aortic Valve: The aortic valve is tricuspid. There is mild calcification of the aortic valve. There is mild thickening of the aortic valve. Aortic valve  regurgitation is not visualized. Aortic valve sclerosis/calcification is present, without any evidence of aortic stenosis. Pulmonic Valve: The pulmonic valve was grossly normal. Pulmonic valve regurgitation is trivial. No evidence of pulmonic stenosis. Aorta: Aortic root could not be assessed and the aortic root, ascending aorta, aortic arch and descending aorta are all structurally normal, with  no evidence of dilitation or obstruction. Venous: The inferior vena cava is dilated in size with less than 50% respiratory variability, suggesting right atrial pressure of 15 mmHg. IAS/Shunts: The atrial septum is grossly normal.  LEFT VENTRICLE PLAX 2D LVIDd:         5.90 cm LVIDs:         4.80 cm LV PW:         1.20 cm LV IVS:        1.10 cm LVOT diam:     1.90 cm LVOT Area:     2.84 cm  RIGHT VENTRICLE             IVC RV S prime:     11.30 cm/s  IVC diam: 2.40 cm TAPSE (M-mode): 2.6 cm LEFT ATRIUM           Index        RIGHT ATRIUM           Index LA diam:      3.60 cm 1.67 cm/m   RA Area:     18.80 cm LA Vol (A4C): 99.6 ml 46.18 ml/m  RA Volume:   49.80 ml  23.09 ml/m   AORTA Ao Root diam: 2.90 cm Ao Asc diam:  3.20 cm  SHUNTS Systemic Diam: 1.90 cm Buford Dresser MD Electronically signed by Buford Dresser MD Signature Date/Time: 01/24/2022/5:52:50 PM    Final    DG Chest Portable 1 View  Result Date: 01/23/2022 CLINICAL DATA:  Dyspnea EXAM: PORTABLE CHEST 1 VIEW COMPARISON:  11/06/2019 FINDINGS: Stable mild cardiomegaly. No focal airspace consolidation, pleural effusion, or pneumothorax. No acute bony findings. IMPRESSION: No active disease. Electronically Signed   By: Davina Poke D.O.   On: 01/23/2022 18:49    Cardiac Studies   See above echo  Patient Profile     76 y.o. male admitted with symptomatic tachy-brady syndrome, now with LV dysfunction  Assessment & Plan    Tachy-brady syndrome - his rates are controlled. He will undergo PPM tomorrow. New LV dysfunction - he will  undergo left heart cath due to newly diagnosed LV dysfunction. GDMT will be prescribed. Atrial fib/flutter with a RVR - I would invision the use of AA drug therapy, likely amiodarone once above sorted out. HTN - he will be treated with coreg and entresto with plans to dc amlodipine, clonidine and irbesartan.  For questions or updates, please contact [place] Please consult [email or web address] for contact info under Cardiology/STEMI.      Signed, Cristopher Peru, MD  01/25/2022, 10:56 AM

## 2022-01-26 ENCOUNTER — Encounter (HOSPITAL_COMMUNITY)
Admission: EM | Disposition: A | Discharge status: Home / Self Care | Payer: Self-pay | Source: Home / Self Care | Attending: Cardiology

## 2022-01-26 ENCOUNTER — Inpatient Hospital Stay (HOSPITAL_COMMUNITY)
Admission: EM | Disposition: A | Discharge status: Home / Self Care | Payer: Self-pay | Source: Home / Self Care | Attending: Cardiology

## 2022-01-26 DIAGNOSIS — I495 Sick sinus syndrome: Secondary | ICD-10-CM

## 2022-01-26 DIAGNOSIS — I484 Atypical atrial flutter: Secondary | ICD-10-CM | POA: Diagnosis not present

## 2022-01-26 HISTORY — PX: LEFT HEART CATH AND CORONARY ANGIOGRAPHY: CATH118249

## 2022-01-26 HISTORY — PX: BIV PACEMAKER INSERTION CRT-P: EP1199

## 2022-01-26 LAB — BASIC METABOLIC PANEL
Anion gap: 5 (ref 5–15)
BUN: 26 mg/dL — ABNORMAL HIGH (ref 8–23)
CO2: 24 mmol/L (ref 22–32)
Calcium: 8.6 mg/dL — ABNORMAL LOW (ref 8.9–10.3)
Chloride: 107 mmol/L (ref 98–111)
Creatinine, Ser: 1.25 mg/dL — ABNORMAL HIGH (ref 0.61–1.24)
GFR, Estimated: >60 mL/min (ref >60)
Glucose, Bld: 107 mg/dL — ABNORMAL HIGH (ref 70–99)
Potassium: 4.1 mmol/L (ref 3.5–5.1)
Sodium: 136 mmol/L (ref 135–145)

## 2022-01-26 SURGERY — LEFT HEART CATH AND CORONARY ANGIOGRAPHY
Anesthesia: LOCAL

## 2022-01-26 SURGERY — BIV PACEMAKER INSERTION CRT-P

## 2022-01-26 MED ORDER — MIDAZOLAM HCL 5 MG/5ML IJ SOLN
INTRAMUSCULAR | Status: DC | PRN
  Administered 2022-01-26: 1 mg via INTRAVENOUS
  Administered 2022-01-26: 2 mg via INTRAVENOUS

## 2022-01-26 MED ORDER — LIDOCAINE HCL 1 % IJ SOLN
INTRAMUSCULAR | Status: AC
  Filled 2022-01-26: qty 20

## 2022-01-26 MED ORDER — FENTANYL CITRATE (PF) 100 MCG/2ML IJ SOLN
INTRAMUSCULAR | Status: AC
  Filled 2022-01-26: qty 2

## 2022-01-26 MED ORDER — IOHEXOL 350 MG/ML SOLN
INTRAVENOUS | Status: DC | PRN
  Administered 2022-01-26: 55 mL
  Administered 2022-01-26: 16 mL

## 2022-01-26 MED ORDER — MIDAZOLAM HCL 2 MG/2ML IJ SOLN
INTRAMUSCULAR | Status: DC | PRN
  Administered 2022-01-26: 2 mg via INTRAVENOUS

## 2022-01-26 MED ORDER — ACETAMINOPHEN 325 MG PO TABS
650.0000 mg | ORAL_TABLET | ORAL | Status: DC | PRN
Start: 2022-01-26 — End: 2022-01-27
  Administered 2022-01-26 – 2022-01-27 (×2): 650 mg via ORAL
  Filled 2022-01-26 (×2): qty 2

## 2022-01-26 MED ORDER — MIDAZOLAM HCL 5 MG/5ML IJ SOLN
INTRAMUSCULAR | Status: AC
  Filled 2022-01-26: qty 5

## 2022-01-26 MED ORDER — VERAPAMIL HCL 2.5 MG/ML IV SOLN
INTRAVENOUS | Status: AC
  Filled 2022-01-26: qty 2

## 2022-01-26 MED ORDER — ONDANSETRON HCL 4 MG/2ML IJ SOLN: 4.0000 mg | Freq: Four times a day (QID) | INTRAMUSCULAR | Status: DC | PRN

## 2022-01-26 MED ORDER — LABETALOL HCL 5 MG/ML IV SOLN: 10.0000 mg | INTRAVENOUS | Status: AC | PRN

## 2022-01-26 MED ORDER — VERAPAMIL HCL 2.5 MG/ML IV SOLN
INTRAVENOUS | Status: DC | PRN
  Administered 2022-01-26: 10 mL via INTRA_ARTERIAL

## 2022-01-26 MED ORDER — SODIUM CHLORIDE 0.9 % IV SOLN: INTRAVENOUS | Status: AC

## 2022-01-26 MED ORDER — CEFAZOLIN SODIUM-DEXTROSE 2-4 GM/100ML-% IV SOLN
INTRAVENOUS | Status: AC
  Filled 2022-01-26: qty 100

## 2022-01-26 MED ORDER — HYDRALAZINE HCL 20 MG/ML IJ SOLN
10.0000 mg | INTRAMUSCULAR | Status: AC | PRN
  Administered 2022-01-26: 10 mg via INTRAVENOUS
  Filled 2022-01-26: qty 1

## 2022-01-26 MED ORDER — DIAZEPAM 5 MG PO TABS: 5.0000 mg | ORAL_TABLET | ORAL | Status: DC | PRN

## 2022-01-26 MED ORDER — ACETAMINOPHEN 325 MG PO TABS: 325.0000 mg | ORAL_TABLET | ORAL | Status: DC | PRN

## 2022-01-26 MED ORDER — SODIUM CHLORIDE 0.9 % IV SOLN
INTRAVENOUS | Status: AC
  Filled 2022-01-26: qty 2

## 2022-01-26 MED ORDER — MIDAZOLAM HCL 2 MG/2ML IJ SOLN
INTRAMUSCULAR | Status: AC
  Filled 2022-01-26: qty 2

## 2022-01-26 MED ORDER — LIDOCAINE HCL (PF) 1 % IJ SOLN
INTRAMUSCULAR | Status: AC
  Filled 2022-01-26: qty 30

## 2022-01-26 MED ORDER — HEPARIN (PORCINE) IN NACL 1000-0.9 UT/500ML-% IV SOLN
INTRAVENOUS | Status: DC | PRN
  Administered 2022-01-26 (×3): 500 mL

## 2022-01-26 MED ORDER — HEPARIN (PORCINE) IN NACL 1000-0.9 UT/500ML-% IV SOLN
INTRAVENOUS | Status: AC
  Filled 2022-01-26: qty 1000

## 2022-01-26 MED ORDER — SODIUM CHLORIDE 0.9 % IV SOLN: 250.0000 mL | INTRAVENOUS | Status: DC | PRN

## 2022-01-26 MED ORDER — HEPARIN (PORCINE) IN NACL 1000-0.9 UT/500ML-% IV SOLN
INTRAVENOUS | Status: AC
  Filled 2022-01-26: qty 500

## 2022-01-26 MED ORDER — SODIUM CHLORIDE 0.9% FLUSH
3.0000 mL | Freq: Two times a day (BID) | INTRAVENOUS | Status: DC
  Administered 2022-01-27: 3 mL via INTRAVENOUS

## 2022-01-26 MED ORDER — CEFAZOLIN SODIUM-DEXTROSE 1-4 GM/50ML-% IV SOLN
1.0000 g | Freq: Four times a day (QID) | INTRAVENOUS | Status: DC
  Administered 2022-01-27 (×2): 1 g via INTRAVENOUS
  Filled 2022-01-26 (×3): qty 50

## 2022-01-26 MED ORDER — HEPARIN SODIUM (PORCINE) 1000 UNIT/ML IJ SOLN
INTRAMUSCULAR | Status: DC | PRN
  Administered 2022-01-26: 5000 [IU] via INTRAVENOUS

## 2022-01-26 MED ORDER — LIDOCAINE HCL 1 % IJ SOLN
INTRAMUSCULAR | Status: AC
Start: 2022-01-26 — End: ?
  Filled 2022-01-26: qty 20

## 2022-01-26 MED ORDER — LIDOCAINE HCL (PF) 1 % IJ SOLN
INTRAMUSCULAR | Status: DC | PRN
  Administered 2022-01-26: 60 mL

## 2022-01-26 MED ORDER — HEPARIN SODIUM (PORCINE) 1000 UNIT/ML IJ SOLN
INTRAMUSCULAR | Status: AC
  Filled 2022-01-26: qty 10

## 2022-01-26 MED ORDER — SODIUM CHLORIDE 0.9% FLUSH: 3.0000 mL | INTRAVENOUS | Status: DC | PRN

## 2022-01-26 MED ORDER — FENTANYL CITRATE (PF) 100 MCG/2ML IJ SOLN
INTRAMUSCULAR | Status: DC | PRN
  Administered 2022-01-26: 25 ug via INTRAVENOUS
  Administered 2022-01-26: 12.5 ug
  Administered 2022-01-26: 25 ug

## 2022-01-26 SURGICAL SUPPLY — 12 items
BAND ZEPHYR COMPRESS 30 LONG (HEMOSTASIS) ×1 IMPLANT
CATH INFINITI JR4 5F (CATHETERS) ×1 IMPLANT
CATH OPTITORQUE TIG 4.0 5F (CATHETERS) ×1 IMPLANT
GLIDESHEATH SLEND SS 6F .021 (SHEATH) ×3 IMPLANT
GUIDEWIRE INQWIRE 1.5J.035X260 (WIRE) IMPLANT
INQWIRE 1.5J .035X260CM (WIRE) ×2
KIT HEART LEFT (KITS) ×2 IMPLANT
MAT PREVALON FULL STRYKER (MISCELLANEOUS) ×1 IMPLANT
PACK CARDIAC CATHETERIZATION (CUSTOM PROCEDURE TRAY) ×2 IMPLANT
SHEATH PROBE COVER 6X72 (BAG) ×1 IMPLANT
TRANSDUCER W/STOPCOCK (MISCELLANEOUS) ×2 IMPLANT
TUBING CIL FLEX 10 FLL-RA (TUBING) ×2 IMPLANT

## 2022-01-26 SURGICAL SUPPLY — 20 items
CABLE SURGICAL S-101-97-12 (CABLE) ×2 IMPLANT
CATH CPS DIRECT 135 DS2C020 (CATHETERS) ×1 IMPLANT
CATH CPS LOCATOR 3D LG (CATHETERS) ×1 IMPLANT
CATH JOSEPH QUAD ALLRED 6F REP (CATHETERS) ×1 IMPLANT
HELIX LOCKING TOOL (MISCELLANEOUS) ×2
LEAD QUARTET 1458QL-86 (Lead) IMPLANT
LEAD TENDRIL MRI 52CM LPA1200M (Lead) ×1 IMPLANT
LEAD TENDRIL SDX 2088TC-58CM (Lead) ×1 IMPLANT
PACEMAKER QUDR ALLR CRT PM3562 (Pacemaker) IMPLANT
PAD DEFIB RADIO PHYSIO CONN (PAD) ×2 IMPLANT
PMKR QUADRA ALLURE CRT PM3562 (Pacemaker) ×2 IMPLANT
QUARTET 1458QL-86 (Lead) ×2 IMPLANT
SHEATH 8FR PRELUDE SNAP 13 (SHEATH) ×1 IMPLANT
SHEATH 9.5FR PRELUDE SNAP 13 (SHEATH) IMPLANT
SHEATH 9FR PRELUDE SNAP 13 (SHEATH) ×1 IMPLANT
SLITTER AGILIS HISPRO (INSTRUMENTS) ×2 IMPLANT
TOOL HELIX LOCKING (MISCELLANEOUS) IMPLANT
TRAY PACEMAKER INSERTION (PACKS) ×2 IMPLANT
WIRE ACUITY WHISPER EDS 4648 (WIRE) ×1 IMPLANT
WIRE HI TORQ VERSACORE-J 145CM (WIRE) ×1 IMPLANT

## 2022-01-26 NOTE — Progress Notes (Signed)
Patient returned from cath lab at 1833hrs.  Right radial site level zero with TR band in place.  Left arm in sling with pressure dressing to left shoulder, dressing CDI.  Reviewed post pacer and cath instruction with patient. Patient verbalized understanding.

## 2022-01-26 NOTE — Interval H&P Note (Signed)
History and Physical Interval Note:  01/26/2022 4:02 PM  Joseph [name]  has presented today for surgery, with the diagnosis of tachy-brady.  The various methods of treatment have been discussed with the patient and family. After consideration of risks, benefits and other options for treatment, the patient has consented to  Procedure(s): BIV PACEMAKER INSERTION CRT-P (N/A) as a surgical intervention.  The patient's history has been reviewed, patient examined, no change in status, stable for surgery.  I have reviewed the patient's chart and labs.  Questions were answered to the patient's satisfaction.     Cristopher Peru

## 2022-01-26 NOTE — H&P (View-Only) (Signed)
Progress Note  Patient Name: Joseph [name] Date of Encounter: 01/26/2022  [facility] Cardiologist: Shelva Majestic, MD   Subjective   No new complaints  Inpatient Medications    Scheduled Meds:  amLODipine  10 mg Oral Daily   aspirin  81 mg Oral Pre-Cath   cholecalciferol  1,000 Units Oral Daily   cloNIDine  0.3 mg Oral BID   doxazosin  8 mg Oral QHS   gentamicin (GARAMYCIN) 80 mg in sodium chloride 0.9 % 500 mL irrigation  80 mg Irrigation On Call   irbesartan  300 mg Oral Daily   latanoprost  1 drop Both Eyes QHS   mirabegron ER  50 mg Oral Daily   multivitamin with minerals  1 tablet Oral Daily   sodium chloride flush  3 mL Intravenous Q12H   spironolactone  12.5 mg Oral Daily   vitamin B-12  500 mcg Oral Daily   Continuous Infusions:  sodium chloride     sodium chloride 50 mL/hr at 01/26/22 0326    ceFAZolin (ANCEF) IV     PRN Meds: sodium chloride, acetaminophen, polyvinyl alcohol, sodium chloride flush   Vital Signs    Vitals:   01/25/22 0700 01/25/22 2021 01/26/22 0054 01/26/22 0429  BP: (!) 154/99 131/68  120/79  Pulse:  (!) 59  (!) 48  Resp: '16 18 17 18  '$ Temp: 98 F (36.7 C) 98.1 F (36.7 C)  97.6 F (36.4 C)  TempSrc: Oral Oral  Oral  SpO2:  98%  98%  Weight:      Height:        Intake/Output Summary (Last 24 hours) at 01/26/2022 0734 Last data filed at 01/26/2022 0326 Gross per 24 hour  Intake 658.3 ml  Output --  Net 658.3 ml      01/24/2022    6:00 AM 01/23/2022    6:22 PM 08/27/2021    1:19 PM  Last 3 Weights  Weight (lbs) 216 lb 0.8 oz 210 lb 212 lb 6.4 oz  Weight (kg) 98 kg 95.255 kg 96.344 kg      Telemetry    Junctional rhythm towards high 30's-40's, Mobitz one as well as SR w/1:1 conduction 70's - Personally Reviewed  ECG    No new EKGs - Personally Reviewed  Physical Exam   GEN: No acute distress.   Neck: No JVD Cardiac: RRR, with some extrasystoles, no murmurs, rubs, or gallops.  Respiratory: CTA b/l. GI:  Soft, nontender, non-distended  MS: No edema; No deformity. Neuro:  Nonfocal  Psych: Normal affect   Labs    High Sensitivity Troponin:   Recent Labs  Lab 01/23/22 1915 01/23/22 2042  TROPONINIHS 36* 39*     Chemistry Recent Labs  Lab 01/23/22 1915 01/24/22 0722 01/26/22 0425  NA 142 141 136  K 3.6 4.3 4.1  CL 111 109 107  CO2 '23 22 24  '$ GLUCOSE 95 115* 107*  BUN 25* 20 26*  CREATININE 1.44* 1.33* 1.25*  CALCIUM 8.4* 8.8* 8.6*  MG  --  2.0  --   PROT 6.4*  --   --   ALBUMIN 3.5  --   --   AST 21  --   --   ALT 17  --   --   ALKPHOS 70  --   --   BILITOT 1.2  --   --   GFRNONAA 51* 56* >60  ANIONGAP '8 10 5    '$ Lipids No results for input(s): "CHOL", "TRIG", "HDL", "  LABVLDL", "LDLCALC", "CHOLHDL" in the last 168 hours.  Hematology Recent Labs  Lab 01/23/22 1915 01/24/22 0722  WBC 4.3 4.6  RBC 5.55 6.00*  HGB 12.3* 13.1  HCT 38.4* 41.5  MCV 69.2* 69.2*  MCH 22.2* 21.8*  MCHC 32.0 31.6  RDW 16.2* 16.2*  PLT 187 179   Thyroid No results for input(s): "TSH", "FREET4" in the last 168 hours.  BNP Recent Labs  Lab 01/23/22 1915  BNP 372.0*    DDimer No results for input(s): "DDIMER" in the last 168 hours.   Radiology     Cardiac Studies    01/24/22: TTE 1. Left ventricular ejection fraction, by estimation, is 35 to 40%. The  left ventricle has moderately decreased function. The left ventricle  demonstrates global hypokinesis. The left ventricular internal cavity size  was mildly dilated. Left ventricular  diastolic function could not be evaluated. There is moderate hypokinesis  of the left ventricular, mid-apical inferior wall, inferolateral wall and  anterolateral wall.   2. Right ventricular systolic function is normal. The right ventricular  size is normal.   3. Left atrial size was moderately dilated.   4. The mitral valve is normal in structure. Trivial mitral valve  regurgitation. No evidence of mitral stenosis.   5. The aortic valve is  tricuspid. There is mild calcification of the  aortic valve. There is mild thickening of the aortic valve. Aortic valve  regurgitation is not visualized. Aortic valve sclerosis/calcification is  present, without any evidence of  aortic stenosis.   6. The inferior vena cava is dilated in size with <50% respiratory  variability, suggesting right atrial pressure of 15 mmHg.   Comparison(s): Changes from prior study are noted.     08/13/20: TTE  1. Left ventricular ejection fraction, by estimation, is 55 to 60%. The  left ventricle has normal function. The left ventricle has no regional  wall motion abnormalities. There is moderate left ventricular hypertrophy.  Left ventricular diastolic  parameters are consistent with Grade I diastolic dysfunction (impaired  relaxation).   2. Right ventricular systolic function is normal. The right ventricular  size is normal. There is mildly elevated pulmonary artery systolic  pressure.   3. Left atrial size was moderately dilated.   4. The mitral valve is abnormal. Trivial mitral valve regurgitation.   5. The aortic valve is tricuspid. Aortic valve regurgitation is not  visualized.   6. The inferior vena cava is normal in size with greater than 50%  respiratory variability, suggesting right atrial pressure of 3 mmHg.   Comparison(s): Changes from prior study are noted. 08/29/18: EF 55-60%.   06/29/2015: stress myoview IMPRESSION: 1. Non reversible decreased myocardial perfusion inferior wall LEFT ventricle.   2. Dilated LEFT ventricle without focal wall motion abnormalities.   3. Left ventricular ejection fraction 42%   4. Intermediate-risk stress test findings*.     Patient Profile     76 y.o. male AFib, HTN, stroke, RBBB, admitted with symptoms of fatigue, perhaps lightheaded, no syncope, initially in AFib w/RVR > SB  Admitted with suspect tachy-brady and EP evaluation  Assessment & Plan    Tachy-brady syndrome Paroxysmal  AFib/flutter (atypical) CHA2DS2Vasc is 5, on Eliquis at home, held for procedures Planned for CRT-P with LVEF 35-40%   New CM Planned for LHC to evaluated for CAD given new CM Prior stress test with fixed IW defect, EF 42% (2016) Subsequent echos with preserved LVEF until now   HTN Started on clonidine here, will stop given  intermittent junctional rhythm Plan to get him back on nodal agents post pacing   Patient is aware of the plan today, answered his follow up questions for both procedures, cath/poss PCI and CRT-pacing, he remains agreeable for both   For questions or updates, please contact [place] Please consult [email or web address] for contact info under        Signed, Baldwin Jamaica, PA-C  01/26/2022, 7:34 AM     EP Attending  Patient seen and examined. Agree with above. The patient has returned to sinus rhythm with a mostly junctional escape. He has undergone left heart cath which demonstrates mod LV dysfunction and no CAD, and with symptomatic tachy-brady will undergo insertion of a biv PPM as he is expected to pace more than 40% of the time.   Carleene Overlie Yasmin Bronaugh,MD

## 2022-01-26 NOTE — Progress Notes (Signed)
Patient taken to cath lab at 1420hrs.

## 2022-01-26 NOTE — Progress Notes (Addendum)
Progress Note  Patient Name: Joseph [name] Date of Encounter: 01/26/2022  [facility] Cardiologist: Shelva Majestic, MD   Subjective   No new complaints  Inpatient Medications    Scheduled Meds:  amLODipine  10 mg Oral Daily   aspirin  81 mg Oral Pre-Cath   cholecalciferol  1,000 Units Oral Daily   cloNIDine  0.3 mg Oral BID   doxazosin  8 mg Oral QHS   gentamicin (GARAMYCIN) 80 mg in sodium chloride 0.9 % 500 mL irrigation  80 mg Irrigation On Call   irbesartan  300 mg Oral Daily   latanoprost  1 drop Both Eyes QHS   mirabegron ER  50 mg Oral Daily   multivitamin with minerals  1 tablet Oral Daily   sodium chloride flush  3 mL Intravenous Q12H   spironolactone  12.5 mg Oral Daily   vitamin B-12  500 mcg Oral Daily   Continuous Infusions:  sodium chloride     sodium chloride 50 mL/hr at 01/26/22 0326    ceFAZolin (ANCEF) IV     PRN Meds: sodium chloride, acetaminophen, polyvinyl alcohol, sodium chloride flush   Vital Signs    Vitals:   01/25/22 0700 01/25/22 2021 01/26/22 0054 01/26/22 0429  BP: (!) 154/99 131/68  120/79  Pulse:  (!) 59  (!) 48  Resp: '16 18 17 18  '$ Temp: 98 F (36.7 C) 98.1 F (36.7 C)  97.6 F (36.4 C)  TempSrc: Oral Oral  Oral  SpO2:  98%  98%  Weight:      Height:        Intake/Output Summary (Last 24 hours) at 01/26/2022 0734 Last data filed at 01/26/2022 0326 Gross per 24 hour  Intake 658.3 ml  Output --  Net 658.3 ml      01/24/2022    6:00 AM 01/23/2022    6:22 PM 08/27/2021    1:19 PM  Last 3 Weights  Weight (lbs) 216 lb 0.8 oz 210 lb 212 lb 6.4 oz  Weight (kg) 98 kg 95.255 kg 96.344 kg      Telemetry    Junctional rhythm towards high 30's-40's, Mobitz one as well as SR w/1:1 conduction 70's - Personally Reviewed  ECG    No new EKGs - Personally Reviewed  Physical Exam   GEN: No acute distress.   Neck: No JVD Cardiac: RRR, with some extrasystoles, no murmurs, rubs, or gallops.  Respiratory: CTA b/l. GI:  Soft, nontender, non-distended  MS: No edema; No deformity. Neuro:  Nonfocal  Psych: Normal affect   Labs    High Sensitivity Troponin:   Recent Labs  Lab 01/23/22 1915 01/23/22 2042  TROPONINIHS 36* 39*     Chemistry Recent Labs  Lab 01/23/22 1915 01/24/22 0722 01/26/22 0425  NA 142 141 136  K 3.6 4.3 4.1  CL 111 109 107  CO2 '23 22 24  '$ GLUCOSE 95 115* 107*  BUN 25* 20 26*  CREATININE 1.44* 1.33* 1.25*  CALCIUM 8.4* 8.8* 8.6*  MG  --  2.0  --   PROT 6.4*  --   --   ALBUMIN 3.5  --   --   AST 21  --   --   ALT 17  --   --   ALKPHOS 70  --   --   BILITOT 1.2  --   --   GFRNONAA 51* 56* >60  ANIONGAP '8 10 5    '$ Lipids No results for input(s): "CHOL", "TRIG", "HDL", "  LABVLDL", "LDLCALC", "CHOLHDL" in the last 168 hours.  Hematology Recent Labs  Lab 01/23/22 1915 01/24/22 0722  WBC 4.3 4.6  RBC 5.55 6.00*  HGB 12.3* 13.1  HCT 38.4* 41.5  MCV 69.2* 69.2*  MCH 22.2* 21.8*  MCHC 32.0 31.6  RDW 16.2* 16.2*  PLT 187 179   Thyroid No results for input(s): "TSH", "FREET4" in the last 168 hours.  BNP Recent Labs  Lab 01/23/22 1915  BNP 372.0*    DDimer No results for input(s): "DDIMER" in the last 168 hours.   Radiology     Cardiac Studies    01/24/22: TTE 1. Left ventricular ejection fraction, by estimation, is 35 to 40%. The  left ventricle has moderately decreased function. The left ventricle  demonstrates global hypokinesis. The left ventricular internal cavity size  was mildly dilated. Left ventricular  diastolic function could not be evaluated. There is moderate hypokinesis  of the left ventricular, mid-apical inferior wall, inferolateral wall and  anterolateral wall.   2. Right ventricular systolic function is normal. The right ventricular  size is normal.   3. Left atrial size was moderately dilated.   4. The mitral valve is normal in structure. Trivial mitral valve  regurgitation. No evidence of mitral stenosis.   5. The aortic valve is  tricuspid. There is mild calcification of the  aortic valve. There is mild thickening of the aortic valve. Aortic valve  regurgitation is not visualized. Aortic valve sclerosis/calcification is  present, without any evidence of  aortic stenosis.   6. The inferior vena cava is dilated in size with <50% respiratory  variability, suggesting right atrial pressure of 15 mmHg.   Comparison(s): Changes from prior study are noted.     08/13/20: TTE  1. Left ventricular ejection fraction, by estimation, is 55 to 60%. The  left ventricle has normal function. The left ventricle has no regional  wall motion abnormalities. There is moderate left ventricular hypertrophy.  Left ventricular diastolic  parameters are consistent with Grade I diastolic dysfunction (impaired  relaxation).   2. Right ventricular systolic function is normal. The right ventricular  size is normal. There is mildly elevated pulmonary artery systolic  pressure.   3. Left atrial size was moderately dilated.   4. The mitral valve is abnormal. Trivial mitral valve regurgitation.   5. The aortic valve is tricuspid. Aortic valve regurgitation is not  visualized.   6. The inferior vena cava is normal in size with greater than 50%  respiratory variability, suggesting right atrial pressure of 3 mmHg.   Comparison(s): Changes from prior study are noted. 08/29/18: EF 55-60%.   06/29/2015: stress myoview IMPRESSION: 1. Non reversible decreased myocardial perfusion inferior wall LEFT ventricle.   2. Dilated LEFT ventricle without focal wall motion abnormalities.   3. Left ventricular ejection fraction 42%   4. Intermediate-risk stress test findings*.     Patient Profile     76 y.o. male AFib, HTN, stroke, RBBB, admitted with symptoms of fatigue, perhaps lightheaded, no syncope, initially in AFib w/RVR > SB  Admitted with suspect tachy-brady and EP evaluation  Assessment & Plan    Tachy-brady syndrome Paroxysmal  AFib/flutter (atypical) CHA2DS2Vasc is 5, on Eliquis at home, held for procedures Planned for CRT-P with LVEF 35-40%   New CM Planned for LHC to evaluated for CAD given new CM Prior stress test with fixed IW defect, EF 42% (2016) Subsequent echos with preserved LVEF until now   HTN Started on clonidine here, will stop given  intermittent junctional rhythm Plan to get him back on nodal agents post pacing   Patient is aware of the plan today, answered his follow up questions for both procedures, cath/poss PCI and CRT-pacing, he remains agreeable for both   For questions or updates, please contact [place] Please consult [email or web address] for contact info under        Signed, Baldwin Jamaica, PA-C  01/26/2022, 7:34 AM     EP Attending  Patient seen and examined. Agree with above. The patient has returned to sinus rhythm with a mostly junctional escape. He has undergone left heart cath which demonstrates mod LV dysfunction and no CAD, and with symptomatic tachy-brady will undergo insertion of a biv PPM as he is expected to pace more than 40% of the time.   Carleene Overlie Tanica Gaige,MD

## 2022-01-26 NOTE — Interval H&P Note (Signed)
Cath Lab Visit (complete for each Cath Lab visit)  Clinical Evaluation Leading to the Procedure:   ACS: No.  Non-ACS:    Anginal Classification: CCS I  Anti-ischemic medical therapy: Minimal Therapy (1 class of medications)  Non-Invasive Test Results: No non-invasive testing performed  Prior CABG: No previous CABG      History and Physical Interval Note:  01/26/2022 2:31 PM  Joseph [name]  has presented today for surgery, with the diagnosis of lv dysfunction.  The various methods of treatment have been discussed with the patient and family. After consideration of risks, benefits and other options for treatment, the patient has consented to  Procedure(s): LEFT HEART CATH AND CORONARY ANGIOGRAPHY (N/A) as a surgical intervention.  The patient's history has been reviewed, patient examined, no change in status, stable for surgery.  I have reviewed the patient's chart and labs.  Questions were answered to the patient's satisfaction.     Shelva Majestic

## 2022-01-27 ENCOUNTER — Other Ambulatory Visit: Payer: Self-pay | Admitting: Physician Assistant

## 2022-01-27 ENCOUNTER — Inpatient Hospital Stay (HOSPITAL_COMMUNITY): Payer: Medicare PPO

## 2022-01-27 ENCOUNTER — Other Ambulatory Visit (HOSPITAL_COMMUNITY): Payer: Self-pay

## 2022-01-27 ENCOUNTER — Encounter (HOSPITAL_COMMUNITY): Payer: Self-pay | Admitting: Cardiovascular Disease

## 2022-01-27 DIAGNOSIS — I495 Sick sinus syndrome: Secondary | ICD-10-CM | POA: Diagnosis not present

## 2022-01-27 DIAGNOSIS — Z79899 Other long term (current) drug therapy: Secondary | ICD-10-CM

## 2022-01-27 LAB — CBC
HCT: 39.4 % (ref 39.0–52.0)
Hemoglobin: 12.7 g/dL — ABNORMAL LOW (ref 13.0–17.0)
MCH: 21.9 pg — ABNORMAL LOW (ref 26.0–34.0)
MCHC: 32.2 g/dL (ref 30.0–36.0)
MCV: 67.9 fL — ABNORMAL LOW (ref 80.0–100.0)
Platelets: 168 10*3/uL (ref 150–400)
RBC: 5.8 MIL/uL (ref 4.22–5.81)
RDW: 16 % — ABNORMAL HIGH (ref 11.5–15.5)
WBC: 4.7 10*3/uL (ref 4.0–10.5)
nRBC: 0 % (ref 0.0–0.2)

## 2022-01-27 LAB — BASIC METABOLIC PANEL
Anion gap: 7 (ref 5–15)
BUN: 21 mg/dL (ref 8–23)
CO2: 22 mmol/L (ref 22–32)
Calcium: 8.7 mg/dL — ABNORMAL LOW (ref 8.9–10.3)
Chloride: 110 mmol/L (ref 98–111)
Creatinine, Ser: 1.18 mg/dL (ref 0.61–1.24)
GFR, Estimated: >60 mL/min (ref >60)
Glucose, Bld: 96 mg/dL (ref 70–99)
Potassium: 4.1 mmol/L (ref 3.5–5.1)
Sodium: 139 mmol/L (ref 135–145)

## 2022-01-27 MED ORDER — SACUBITRIL-VALSARTAN 24-26 MG PO TABS
1.0000 | ORAL_TABLET | Freq: Two times a day (BID) | ORAL | 5 refills | Status: DC
  Filled 2022-01-27: qty 180, 90d supply, fill #1
  Filled 2022-01-27: qty 60, 30d supply, fill #1
  Filled 2022-01-27: qty 60, 30d supply, fill #0

## 2022-01-27 MED ORDER — SACUBITRIL-VALSARTAN 24-26 MG PO TABS
1.0000 | ORAL_TABLET | Freq: Two times a day (BID) | ORAL | Status: DC
  Administered 2022-01-27: 1 via ORAL
  Filled 2022-01-27: qty 1

## 2022-01-27 MED ORDER — CARVEDILOL 6.25 MG PO TABS
6.2500 mg | ORAL_TABLET | Freq: Two times a day (BID) | ORAL | 5 refills | Status: DC
  Filled 2022-01-27: qty 60, 30d supply, fill #0

## 2022-01-27 MED ORDER — CARVEDILOL 6.25 MG PO TABS
6.2500 mg | ORAL_TABLET | Freq: Two times a day (BID) | ORAL | Status: DC
  Administered 2022-01-27: 6.25 mg via ORAL
  Filled 2022-01-27: qty 1

## 2022-01-27 MED FILL — Lidocaine HCl Local Inj 1%: INTRAMUSCULAR | Qty: 60 | Status: AC

## 2022-01-27 MED FILL — Lidocaine HCl Local Preservative Free (PF) Inj 1%: INTRAMUSCULAR | Qty: 30 | Status: AC

## 2022-01-27 NOTE — Discharge Instructions (Signed)
    Supplemental Discharge Instructions for  Pacemaker/Defibrillator Patients   Activity No heavy lifting or vigorous activity with your left/right arm for 6 to 8 weeks.  Do not raise your left/right arm above your head for one week.  Gradually raise your affected arm as drawn below.             01/31/22                     02/01/22                   02/02/22                  02/03/22 __  NO DRIVING for   1 week  ; you may begin driving on  7/85/88   .  WOUND CARE Keep the wound area clean and dry.  Do not get this area wet , no showers for one week; you may shower on   02/03/22  . The tape/steri-strips on your wound will fall off; do not pull them off.  No bandage is needed on the site.  DO  NOT apply any creams, oils, or ointments to the wound area. If you notice any drainage or discharge from the wound, any swelling or bruising at the site, or you develop a fever > 101? F after you are discharged home, call the office at once.  Special Instructions You are still able to use cellular telephones; use the ear opposite the side where you have your pacemaker/defibrillator.  Avoid carrying your cellular phone near your device. When traveling through airports, show security personnel your identification card to avoid being screened in the metal detectors.  Ask the security personnel to use the hand wand. Avoid arc welding equipment, MRI testing (magnetic resonance imaging), TENS units (transcutaneous nerve stimulators).  Call the office for questions about other devices. Avoid electrical appliances that are in poor condition or are not properly grounded. Microwave ovens are safe to be near or to operate.

## 2022-01-27 NOTE — Care Management (Signed)
  Transition of Care [facility]) Screening Note   Patient Details  Name: Joseph [name] Date of Birth: [birth year]   Transition of Care [facility]) CM/SW Contact:    Bethena Roys, RN Phone Number: 01/27/2022, 10:20 AM    Transition of Care Department [facility]) has reviewed patient and no TOC needs have been identified at this time. We will continue to monitor patient advancement through interdisciplinary progression rounds. If new patient transition needs arise, please place a TOC consult.

## 2022-01-27 NOTE — Discharge Summary (Addendum)
DISCHARGE SUMMARY    Patient ID: Joseph [name],  MRN: [id number], DOB/AGE: [birth year] 76 y.o.  Admit date: 01/23/2022 Discharge date: 01/27/2022  Primary Care Physician: Lemmie Evens, MD  Primary Cardiologist: Dr. Claiborne Billings Electrophysiologist: new, Dr. 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   Implantation of a Abbott dual CRT-PPM on 01/26/22 by Dr Lovena Le.  See procedure report for details.   Brief HPI: Joseph [name] is a 76 y.o. male w/PMHx including above sought medical attention with progressive fatigue, lightheadedness, found in AFib w/RVR that transitioned to SB/junctional rhythm spontaneously.  He was admitted for further management.  Hospital Course:  The patient was admitted labs note AKI, HS Trops unremarkable  at 25, 13.  He had not used his PRN lopressor at all at home for months by the patient's report.  EP was consulted with tachy-brady.  Planned for PPM, though TTE noted a new reduction in his LVEF to 35-40% and planned for LHC first. LHC done yesterday did not note any obstructive CAD and he and underwent implantation of a CRT-pacer.    He was monitored on telemetry throughout his stay, he is on AV paced/SR/V paced rhythm this morning.  Left chest was without hematoma or ecchymosis.   R radial site is stable as well.  The device was interrogated and found to be functioning normally.  CXR was obtained and demonstrated no pneumothorax status post device implantation.  Wound care, arm mobility, and restrictions were reviewed with the patient.  The patient feels well, denies any CP/SOB, with minimal pacer site discomfort.  No discomfort at his cath site.  He was examined by Dr. Lovena Le and considered stable for discharge to home.   Will  start coreg and change his irbesartan to Entresto Stop his minoxidil Continue his clonidine with plans to titrate off out pt and advance his GDMT Cardiology follow up is in place as well as usual post implant EP BMET is ordered to be done at his wound check visit   Physical Exam: Vitals:   01/26/22 2347 01/27/22 0447 01/27/22 0837 01/27/22 0848  BP: (!) 144/59 (!) 147/77 (!) 191/71 (!) 191/71  Pulse: 62 65 73 78  Resp: '19 20  20  '$ Temp: 98.5 F (36.9 C) 98.5 F (36.9 C)    TempSrc: Oral Oral    SpO2: 98% 99%  97%  Weight:  95.3 kg    Height:        GEN- The patient is well appearing, alert and oriented x 3 today.   HEENT: normocephalic, atraumatic; sclera clear, conjunctiva pink; hearing intact; oropharynx clear; neck supple, no JVP Lungs- CTA b/l, normal work of breathing.  No wheezes, rales, rhonchi Heart- RRR, no murmurs, rubs or gallops, PMI not laterally displaced GI- soft, non-tender, non-distended Extremities- no clubbing, cyanosis, or edema, R radial site is stable MS- no significant deformity or atrophy Skin- warm and dry, no rash or lesion, left chest without hematoma/ecchymosis Psych- euthymic mood, full affect Neuro- no gross deficits   Labs:   Lab Results  Component Value Date   WBC 4.7 01/27/2022   HGB 12.7 (L) 01/27/2022   HCT 39.4 01/27/2022   MCV 67.9 (L) 01/27/2022   PLT 168 01/27/2022    Recent Labs  Lab 01/23/22 1915 01/24/22 0722 01/27/22  0353  NA 142   < > 139  K 3.6   < > 4.1  CL 111   < > 110  CO2 23   < > 22  BUN 25*   < > 21  CREATININE 1.44*   < > 1.18  CALCIUM 8.4*   < > 8.7*  PROT 6.4*  --   --   BILITOT 1.2  --   --   ALKPHOS 70  --   --   ALT 17  --   --   AST 21  --   --   GLUCOSE 95   < > 96   < > = values in this interval not displayed.    Discharge Medications:  Allergies as of 01/27/2022   No Known Allergies      Medication List     STOP taking these medications    irbesartan 300 MG tablet Commonly known  as: AVAPRO   minoxidil 2.5 MG tablet Commonly known as: LONITEN       TAKE these medications    alfuzosin 10 MG 24 hr tablet Commonly known as: UROXATRAL Take 1 tablet (10 mg total) by mouth daily with breakfast.   amLODipine 10 MG tablet Commonly known as: NORVASC TAKE 1 TABLET(10 MG) BY MOUTH DAILY What changed: See the new instructions.   apixaban 5 MG Tabs tablet Commonly known as: Eliquis Take 1 tablet (5 mg total) by mouth 2 (two) times daily.   carvedilol 6.25 MG tablet Commonly known as: COREG Take 1 tablet (6.25 mg total) by mouth 2 (two) times daily with a meal.   cholecalciferol 25 MCG (1000 UNIT) tablet Commonly known as: VITAMIN D Take 1,000 Units by mouth daily.   cloNIDine 0.3 MG tablet Commonly known as: CATAPRES Take 0.3 mg by mouth 2 (two) times daily.   doxazosin 8 MG tablet Commonly known as: CARDURA Take 8 mg by mouth at bedtime.   latanoprost 0.005 % ophthalmic solution Commonly known as: XALATAN Place 1 drop into both eyes at bedtime as needed (irritation).   LORazepam 0.5 MG tablet Commonly known as: ATIVAN Take 0.5 mg by mouth daily as needed for anxiety.   Lubricant Eye Drops 0.4-0.3 % Soln Generic drug: Polyethyl Glycol-Propyl Glycol Place 1 drop into both eyes 3 (three) times daily as needed (dry/irritated eyes.).   metoprolol tartrate 25 MG tablet Commonly known as: LOPRESSOR Take 1/2 tablet (12.'5mg'$ ) to 1 tablet ('25mg'$ ) by mouth as needed for elevated heart rate   multivitamin with minerals Tabs tablet Take 1 tablet by mouth daily.   Myrbetriq 50 MG Tb24 tablet Generic drug: mirabegron ER TAKE 1 TABLET(50 MG) BY MOUTH DAILY What changed: See the new instructions.   PROSTATE HEALTH PO Take 1 capsule by mouth daily. Super Beta Prostate   sacubitril-valsartan 24-26 MG Commonly known as: ENTRESTO Take 1 tablet by mouth 2 (two) times daily.   saw palmetto 500 MG capsule Take 500 mg by mouth every evening.   solifenacin 5  MG tablet Commonly known as: VESICARE TAKE 1 TABLET(5 MG) BY MOUTH DAILY What changed:  how much to take how to take this when to take this reasons to take this additional instructions   spironolactone 25 MG tablet Commonly known as: ALDACTONE TAKE 1/2 TABLET BY MOUTH EVERY DAY   Turmeric 500 MG Caps Take 500 mg by mouth daily.   Vitamin B Complex Tabs Take 1 tablet by mouth daily.  Discharge Care Instructions  (From admission, onward)           Start     Ordered   01/27/22 0000  Discharge wound care:       Comments: As per AVS   01/27/22 1051            Disposition:  Discharge Instructions     Diet - low sodium heart healthy   Complete by: As directed    Discharge wound care:   Complete by: As directed    As per AVS   Increase activity slowly   Complete by: As directed         Duration of Discharge Encounter: Greater than 30 minutes including physician time.  Venetia Night, PA-C 01/27/2022 10:58 AM  EP attending  Patient seen and examined.  Agree with the findings as noted above.  The patient is doing well after insertion of a dual-chamber pacemaker.  He is maintaining sinus rhythm.  Interrogation of his pacemaker under my direction demonstrates normal atrial and ventricular pacing leads with normal device function.  Chest x-ray demonstrates satisfactory lead position with no complicating features.  The patient will be discharged home.  Usual follow-up will be recommended.  He will continue his current medications as noted above.  He will hold his systemic anticoagulation for several days as noted above.  I will see him back in my [place] office.  Cristopher Peru, MD

## 2022-01-27 NOTE — Care Management Important Message (Signed)
Important Message  Patient Details  Name: PHILO KURTZ MRN: [id number] Date of Birth: [birth year]   Medicare Important Message Given:  Yes     Shelda Altes 01/27/2022, 11:16 AM

## 2022-01-28 LAB — LIPOPROTEIN A (LPA): Lipoprotein (a): 131 nmol/L — ABNORMAL HIGH (ref <75.0)

## 2022-02-03 ENCOUNTER — Observation Stay (HOSPITAL_COMMUNITY)
Admission: EM | Admit: 2022-02-03 | Discharge: 2022-02-04 | Disposition: A | Discharge status: Home / Self Care | Payer: Medicare PPO | Attending: Family Medicine | Admitting: Family Medicine

## 2022-02-03 ENCOUNTER — Other Ambulatory Visit: Payer: Self-pay

## 2022-02-03 ENCOUNTER — Emergency Department (HOSPITAL_COMMUNITY): Payer: Medicare PPO

## 2022-02-03 ENCOUNTER — Encounter (HOSPITAL_COMMUNITY): Payer: Self-pay | Admitting: Emergency Medicine

## 2022-02-03 ENCOUNTER — Other Ambulatory Visit: Payer: Self-pay | Admitting: Cardiovascular Disease

## 2022-02-03 ENCOUNTER — Telehealth: Payer: Self-pay | Admitting: Cardiovascular Disease

## 2022-02-03 DIAGNOSIS — I959 Hypotension, unspecified: Secondary | ICD-10-CM | POA: Diagnosis not present

## 2022-02-03 DIAGNOSIS — Z87891 Personal history of nicotine dependence: Secondary | ICD-10-CM | POA: Diagnosis not present

## 2022-02-03 DIAGNOSIS — Z79899 Other long term (current) drug therapy: Secondary | ICD-10-CM | POA: Insufficient documentation

## 2022-02-03 DIAGNOSIS — I495 Sick sinus syndrome: Secondary | ICD-10-CM | POA: Diagnosis not present

## 2022-02-03 DIAGNOSIS — I9589 Other hypotension: Secondary | ICD-10-CM

## 2022-02-03 DIAGNOSIS — Z950 Presence of cardiac pacemaker: Secondary | ICD-10-CM | POA: Diagnosis not present

## 2022-02-03 DIAGNOSIS — I10 Essential (primary) hypertension: Secondary | ICD-10-CM | POA: Diagnosis present

## 2022-02-03 DIAGNOSIS — I428 Other cardiomyopathies: Secondary | ICD-10-CM | POA: Diagnosis not present

## 2022-02-03 DIAGNOSIS — N179 Acute kidney failure, unspecified: Principal | ICD-10-CM | POA: Diagnosis present

## 2022-02-03 DIAGNOSIS — Z8673 Personal history of transient ischemic attack (TIA), and cerebral infarction without residual deficits: Secondary | ICD-10-CM | POA: Diagnosis not present

## 2022-02-03 DIAGNOSIS — I480 Paroxysmal atrial fibrillation: Secondary | ICD-10-CM | POA: Diagnosis not present

## 2022-02-03 DIAGNOSIS — R42 Dizziness and giddiness: Secondary | ICD-10-CM | POA: Diagnosis present

## 2022-02-03 DIAGNOSIS — Z7901 Long term (current) use of anticoagulants: Secondary | ICD-10-CM | POA: Insufficient documentation

## 2022-02-03 DIAGNOSIS — R351 Nocturia: Secondary | ICD-10-CM

## 2022-02-03 LAB — CBC
HCT: 43.9 % (ref 39.0–52.0)
Hemoglobin: 13.8 g/dL (ref 13.0–17.0)
MCH: 21.7 pg — ABNORMAL LOW (ref 26.0–34.0)
MCHC: 31.4 g/dL (ref 30.0–36.0)
MCV: 69 fL — ABNORMAL LOW (ref 80.0–100.0)
Platelets: 206 10*3/uL (ref 150–400)
RBC: 6.36 MIL/uL — ABNORMAL HIGH (ref 4.22–5.81)
RDW: 16.6 % — ABNORMAL HIGH (ref 11.5–15.5)
WBC: 3.8 10*3/uL — ABNORMAL LOW (ref 4.0–10.5)
nRBC: 0 % (ref 0.0–0.2)

## 2022-02-03 LAB — CBG MONITORING, ED: Glucose-Capillary: 111 mg/dL — ABNORMAL HIGH (ref 70–99)

## 2022-02-03 LAB — URINALYSIS, ROUTINE W REFLEX MICROSCOPIC
Bilirubin Urine: NEGATIVE
Glucose, UA: NEGATIVE mg/dL
Hgb urine dipstick: NEGATIVE
Ketones, ur: NEGATIVE mg/dL
Leukocytes,Ua: NEGATIVE
Nitrite: NEGATIVE
Protein, ur: NEGATIVE mg/dL
Specific Gravity, Urine: 1.019 (ref 1.005–1.030)
pH: 5 (ref 5.0–8.0)

## 2022-02-03 LAB — BASIC METABOLIC PANEL
Anion gap: 8 (ref 5–15)
BUN: 40 mg/dL — ABNORMAL HIGH (ref 8–23)
CO2: 25 mmol/L (ref 22–32)
Calcium: 8.9 mg/dL (ref 8.9–10.3)
Chloride: 99 mmol/L (ref 98–111)
Creatinine, Ser: 1.81 mg/dL — ABNORMAL HIGH (ref 0.61–1.24)
GFR, Estimated: 39 mL/min — ABNORMAL LOW (ref >60)
Glucose, Bld: 108 mg/dL — ABNORMAL HIGH (ref 70–99)
Potassium: 4.6 mmol/L (ref 3.5–5.1)
Sodium: 132 mmol/L — ABNORMAL LOW (ref 135–145)

## 2022-02-03 MED ORDER — POLYETHYLENE GLYCOL 3350 17 G PO PACK: 17.0000 g | PACK | Freq: Every day | ORAL | Status: DC | PRN

## 2022-02-03 MED ORDER — CHLORHEXIDINE GLUCONATE CLOTH 2 % EX PADS
6.0000 | MEDICATED_PAD | Freq: Every day | CUTANEOUS | Status: DC
  Administered 2022-02-04: 6 via TOPICAL

## 2022-02-03 MED ORDER — SODIUM CHLORIDE 0.9 % IV SOLN: INTRAVENOUS | Status: AC

## 2022-02-03 MED ORDER — AMLODIPINE BESYLATE 10 MG PO TABS: 10.0000 mg | ORAL_TABLET | Freq: Every morning | ORAL | 1 refills | Status: DC

## 2022-02-03 MED ORDER — ONDANSETRON HCL 4 MG/2ML IJ SOLN: 4.0000 mg | Freq: Four times a day (QID) | INTRAMUSCULAR | Status: DC | PRN

## 2022-02-03 MED ORDER — ACETAMINOPHEN 325 MG PO TABS: 650.0000 mg | ORAL_TABLET | Freq: Four times a day (QID) | ORAL | Status: DC | PRN

## 2022-02-03 MED ORDER — APIXABAN 5 MG PO TABS
5.0000 mg | ORAL_TABLET | Freq: Two times a day (BID) | ORAL | Status: DC
  Administered 2022-02-03 – 2022-02-04 (×2): 5 mg via ORAL
  Filled 2022-02-03 (×2): qty 1

## 2022-02-03 MED ORDER — CARVEDILOL 3.125 MG PO TABS
6.2500 mg | ORAL_TABLET | Freq: Two times a day (BID) | ORAL | Status: DC
  Administered 2022-02-03 – 2022-02-04 (×2): 6.25 mg via ORAL
  Filled 2022-02-03 (×2): qty 2

## 2022-02-03 MED ORDER — SODIUM CHLORIDE 0.9 % IV BOLUS
1000.0000 mL | Freq: Once | INTRAVENOUS | Status: AC
  Administered 2022-02-03: 1000 mL via INTRAVENOUS

## 2022-02-03 MED ORDER — ALFUZOSIN HCL ER 10 MG PO TB24
10.0000 mg | ORAL_TABLET | Freq: Every day | ORAL | Status: DC
Start: 2022-02-04 — End: 2022-02-04
  Administered 2022-02-04: 10 mg via ORAL
  Filled 2022-02-03 (×4): qty 1

## 2022-02-03 MED ORDER — ACETAMINOPHEN 650 MG RE SUPP: 650.0000 mg | Freq: Four times a day (QID) | RECTAL | Status: DC | PRN

## 2022-02-03 MED ORDER — ONDANSETRON HCL 4 MG PO TABS: 4.0000 mg | ORAL_TABLET | Freq: Four times a day (QID) | ORAL | Status: DC | PRN

## 2022-02-03 MED ORDER — SODIUM CHLORIDE 0.9 % IV SOLN: INTRAVENOUS | Status: DC

## 2022-02-03 MED ORDER — SODIUM CHLORIDE 0.9 % IV BOLUS
500.0000 mL | Freq: Once | INTRAVENOUS | Status: AC
  Administered 2022-02-03: 500 mL via INTRAVENOUS

## 2022-02-03 NOTE — Assessment & Plan Note (Addendum)
Presenting with symptomatic hypotension blood pressure down to 70 systolic.  Improved after fluids.  Likely due to multiple blood pressure lowering medications medications-Norvasc, HCTZ, clonidine 0.3 twice daily, doxazosin, ?? possibly also metoprolol, in addition to new medications carvedilol and Entresto. -Blood pressure improved and currently up to 354 systolic .  Will resume carvedilol hold Entresto with AKI -Hydrate -May develop hypertension from clonidine d/c

## 2022-02-03 NOTE — H&P (Addendum)
History and Physical    Joseph [name] SEG:[id number] DOB: [birth year] DOA: 02/03/2022  PCP: Lemmie Evens, MD   Patient coming from: Home  I have personally briefly reviewed patient's old medical records in [place]  Chief Complaint: Dizziness  HPI: Joseph [name] is a 76 y.o. 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 Patient presented to the ED today with reports of hypotension and dizziness.  Patient reports feeling woozy 2 days ago, blood pressure was 129/71 then, today dizziness returned and was more severe, blood pressure was checked document was 95/53.  He went to see his primary care provider and blood pressure was 70/56.  No vomiting no loose stools he has maintained good oral intake.  Recent hospitalization at [facility] 7/14 to 7/18 under cardiology service, for initial atrial fibrillation with RVR that transition to sinus bradycardia/junctional rhythm spontaneously.  Also had an AKI.  EP was consulted for tachybradycardia syndrome.  TTE showed new low EF of 35 to 40%, so left heart cath was done and was without obstructive coronary artery disease, he underwent implantation of CRT pacer, By Dr. Lovena Le.  Tolerated procedure well.  On discharge, he was started on Coreg and his irbesartan was switched to [place].  His mental laxity was discontinued and there was a plan to titrate off his clonidine, and advance his GDMT.  Patient's wife is at bedside, she tells me patient has been taking his old medications which include Norvasc,  clonidine, HCTZ, spironolactone, also tells me he takes metoprolol.  In addition to new medications Coreg and Entresto.  ED Course: Blood pressure initially 70/56, improved to 150s after 1.5 L bolus given.  Heart rate 59-61. Creatinine elevated 1.8. EDP consult cardiology agreed with hospitalization.  Review of Systems: As per HPI all other  systems reviewed and negative.  Past Medical History:  Diagnosis Date   1st degree AV block    Atrial fibrillation (HCC)    Diastolic dysfunction 7/37   grade 1   Hypertension    New onset atrial fibrillation ([place]) 06/2015   RBBB    Sinus bradycardia    Stroke ([place])    no defecits, pt denies having a stroke, says [name] diagnosed this    Past Surgical History:  Procedure Laterality Date   BIV PACEMAKER INSERTION CRT-P N/A 01/26/2022   Procedure: BIV PACEMAKER INSERTION CRT-P;  Surgeon: Evans Lance, MD;  Location: [place] CV LAB;  Service: Cardiovascular;  Laterality: N/A;   COLONOSCOPY     COLONOSCOPY  12/30/2011   Procedure: COLONOSCOPY;  Surgeon: [name], MD;  [facility];  Service: Endoscopy;  Laterality: N/A;  1:45PM   COLONOSCOPY N/A 04/19/2019   Procedure: COLONOSCOPY;  Surgeon: [name], MD;  [facility];  Service: Endoscopy;  Laterality: N/A;  2:00pm   EXCISION MASS LOWER EXTREMETIES Left 05/12/2018   Procedure: Left foot plantar fibroma excision;  Surgeon: [name], MD;  Location: [place];  Service: Orthopedics;  Laterality: Left;   FOOT SURGERY Left    LEFT HEART CATH AND CORONARY ANGIOGRAPHY N/A 01/26/2022   Procedure: LEFT HEART CATH AND CORONARY ANGIOGRAPHY;  Surgeon: Troy Sine, MD;  Location: [place] CV LAB;  Service: Cardiovascular;  Laterality: N/A;   SPERMATOCELECTOMY Right 11/13/2019   Procedure: EXCISION OF SPERMATIC CORD CYST;  Surgeon: [name], MD;  Location: [facility];  Service: Urology;  Laterality:  Right;   VASECTOMY  11/13/2019   Procedure: VASECTOMY;  Surgeon: [name], MD;  Location: [facility];  Service: Urology;;     reports that he quit smoking about 23 years ago. His smoking use included cigarettes. He has never used smokeless tobacco. He reports current alcohol use. He reports that he does not use drugs.  No Known Allergies  Family History  Problem Relation  Age of Onset   Cancer Brother    CVA Mother    Hypertension Father    Colon cancer Neg Hx     Prior to Admission medications   Medication Sig Start Date End Date Taking? Authorizing Provider  alfuzosin (UROXATRAL) 10 MG 24 hr tablet Take 1 tablet (10 mg total) by mouth daily with breakfast. 09/23/21  Yes McKenzie, Candee Furbish, MD  amLODipine (NORVASC) 10 MG tablet Take 1 tablet (10 mg total) by mouth in the morning. PATIENT MUST SCHEDULE ANNUAL APPOINTMENT FOR FUTURE REFILLS 02/03/22  Yes Troy Sine, MD  apixaban (ELIQUIS) 5 MG TABS tablet Take 1 tablet (5 mg total) by mouth 2 (two) times daily. 07/18/15  Yes Brett Canales, PA-C  B Complex Vitamins (VITAMIN B COMPLEX) TABS Take 1 tablet by mouth daily.   Yes [provider]  carvedilol (COREG) 6.25 MG tablet Take 1 tablet (6.25 mg) by mouth 2 (two) times daily with a meal. 01/27/22  Yes Baldwin Jamaica, PA-C  cholecalciferol (VITAMIN D) 25 MCG (1000 UT) tablet Take 1,000 Units by mouth daily.   Yes [provider]  cloNIDine (CATAPRES) 0.3 MG tablet Take 0.3 mg by mouth 2 (two) times daily. 12/29/21  Yes [provider]  doxazosin (CARDURA) 8 MG tablet Take 8 mg by mouth at bedtime.    Yes [provider]  hydrochlorothiazide (HYDRODIURIL) 25 MG tablet Take 25 mg by mouth daily. 01/28/22  Yes [provider]  latanoprost (XALATAN) 0.005 % ophthalmic solution Place 1 drop into both eyes at bedtime as needed (irritation). 09/12/21  Yes [provider]  LORazepam (ATIVAN) 0.5 MG tablet Take 0.5 mg by mouth daily as needed for sleep.   Yes [provider]  Misc Natural Products (PROSTATE HEALTH PO) Take 1 capsule by mouth daily. Super Beta Prostate   Yes [provider]  Multiple Vitamin (MULTIVITAMIN WITH MINERALS) TABS tablet Take 1 tablet by mouth daily.   Yes [provider]  MYRBETRIQ 50 MG TB24 tablet TAKE 1 TABLET(50 MG) BY MOUTH DAILY Patient taking differently:  Take 50 mg by mouth daily as needed (frequent urination (take when going out)). 11/10/21  Yes McKenzie, Candee Furbish, MD  Polyethyl Glycol-Propyl Glycol (LUBRICANT EYE DROPS) 0.4-0.3 % SOLN Place 1 drop into both eyes 3 (three) times daily as needed (dry/irritated eyes.).   Yes [provider]  sacubitril-valsartan (ENTRESTO) 24-26 MG Take 1 tablet by mouth 2 (two) times daily. 01/27/22  Yes Baldwin Jamaica, PA-C  saw palmetto 500 MG capsule Take 500 mg by mouth every evening.   Yes [provider]  solifenacin (VESICARE) 5 MG tablet TAKE 1 TABLET(5 MG) BY MOUTH DAILY Patient taking differently: Take 5 mg by mouth daily as needed (frequent urination (take when going out)). 09/23/21  Yes McKenzie, Candee Furbish, MD  spironolactone (ALDACTONE) 25 MG tablet TAKE 1/2 TABLET BY MOUTH EVERY DAY Patient taking differently: Take 12.5 mg by mouth daily. 04/28/21  Yes Troy Sine, MD  Turmeric 500 MG CAPS Take 500 mg by mouth daily.  Yes [provider]  metoprolol tartrate (LOPRESSOR) 25 MG tablet Take 1/2 tablet (12.'5mg'$ ) to 1 tablet ('25mg'$ ) by mouth as needed for elevated heart rate Patient not taking: Reported on 01/25/2022 10/31/20   Troy Sine, MD    Physical Exam: Vitals:   02/03/22 1600 02/03/22 1640 02/03/22 1700 02/03/22 1730  BP: (!) 154/86 (!) 141/96 (!) 145/85 (!) 159/85  Pulse: 60 60 61 (!) 59  Resp: '17 19 18 12  '$ Temp:      TempSrc:      SpO2: 100% 99% 100% 100%  Weight:      Height:        Constitutional: NAD, calm, comfortable Vitals:   02/03/22 1600 02/03/22 1640 02/03/22 1700 02/03/22 1730  BP: (!) 154/86 (!) 141/96 (!) 145/85 (!) 159/85  Pulse: 60 60 61 (!) 59  Resp: '17 19 18 12  '$ Temp:      TempSrc:      SpO2: 100% 99% 100% 100%  Weight:      Height:       Eyes: PERRL, lids and conjunctivae normal ENMT: Mucous membranes are moist.  Neck: normal, supple, no masses, no thyromegaly Respiratory: clear to auscultation bilaterally, no wheezing, no  crackles. Normal respiratory effort. No accessory muscle use.  Cardiovascular: Regular rate and rhythm, no murmurs / rubs / gallops. No extremity edema.  Lower extremities warm.   Abdomen: no tenderness, no masses palpated. No hepatosplenomegaly. Bowel sounds positive.  Musculoskeletal: no clubbing / cyanosis. No joint deformity upper and lower extremities. Good ROM, no contractures. Normal muscle tone.  Skin: Dressing still present from recent surgical procedure  (pacemaker placement ) to left pectoralis area, no surrounding tenderness, or drainage.  Neurologic: No apparent cranial nerve abnormality moving extremities spontaneously Psychiatric: Normal judgment and insight. Alert and oriented x 3. Normal mood.   Labs on Admission: I have personally reviewed following labs and imaging studies  CBC: Recent Labs  Lab 02/03/22 1526  WBC 3.8*  HGB 13.8  HCT 43.9  MCV 69.0*  PLT 423   Basic Metabolic Panel: Recent Labs  Lab 02/03/22 1526  NA 132*  K 4.6  CL 99  CO2 25  GLUCOSE 108*  BUN 40*  CREATININE 1.81*  CALCIUM 8.9    CBG: Recent Labs  Lab 02/03/22 1502  GLUCAP 111*   Urine analysis:    Component Value Date/Time   COLORURINE AMBER (A) 02/03/2022 1531   APPEARANCEUR HAZY (A) 02/03/2022 1531   APPEARANCEUR Clear 09/23/2021 1211   LABSPEC 1.019 02/03/2022 1531   PHURINE 5.0 02/03/2022 1531   GLUCOSEU NEGATIVE 02/03/2022 1531   HGBUR NEGATIVE 02/03/2022 1531   BILIRUBINUR NEGATIVE 02/03/2022 1531   BILIRUBINUR Negative 09/23/2021 1211   KETONESUR NEGATIVE 02/03/2022 1531   PROTEINUR NEGATIVE 02/03/2022 1531   NITRITE NEGATIVE 02/03/2022 1531   LEUKOCYTESUR NEGATIVE 02/03/2022 1531    Radiological Exams on Admission: DG Chest Port 1 View  Result Date: 02/03/2022 CLINICAL DATA:  Weakness, hypotension EXAM: PORTABLE CHEST 1 VIEW COMPARISON:  01/27/2022 FINDINGS: Redemonstrated left chest cardiac device with leads in unchanged position. Cardiomegaly. No focal  pulmonary opacity. No pleural effusion or pneumothorax. No acute osseous abnormality. IMPRESSION: No acute cardiopulmonary process. Electronically Signed   By: Merilyn Baba M.D.   On: 02/03/2022 15:45    EKG: Independently reviewed.  Rate 60, AV dual paced.  QTc 467.  LVH.  No significant change from prior.  Assessment/Plan Principal Problem:   AKI (acute kidney injury) ([place]) Active Problems:  HTN (hypertension)   Paroxysmal atrial fibrillation (HCC)   Sinus node dysfunction (HCC)   NICM (nonischemic cardiomyopathy) (HCC)  Assessment and Plan: * AKI (acute kidney injury) ([place]) Creatinine elevated at 1.8, baseline 1.1-1.4.  Likely secondary to hypotension from antihypertensive medications and Entresto, HCTZ. -1.5 L bolus given, continue N/s 75cc/hr x 10hrs -Hold HCTZ, Entresto, spironolactone  NICM (nonischemic cardiomyopathy) (HCC) New low EF of 35 to 40%, on echo 01/24/2022.  Subsequent left heart cath without significant coronary artery disease.  Euvolemic today.  -Gentle fluids for hypotension and AKI  Sinus node dysfunction (HCC) Recent paroxysmal atrial fibrillation that spontaneously converted to sinus bradycardia/rhythm.  Hospitalized and had pacemaker placed 7/17 Dr. Lovena Le.  EKG today showing AV dual paced rhythm. Surgical site clean without evidence of infection.  HTN (hypertension) Presenting with symptomatic hypotension blood pressure down to 70 systolic.  Improved after fluids.  Likely due to multiple blood pressure lowering medications medications-Norvasc, HCTZ, clonidine 0.3 twice daily, doxazosin, ?? possibly also metoprolol, in addition to new medications carvedilol and Entresto. -Blood pressure improved and currently up to 379 systolic .  Will resume carvedilol hold Entresto with AKI -Hydrate -May develop hypertension from clonidine d/c     DVT prophylaxis: Eliquis Code Status: Full Family Communication: Spouse at bedside Disposition Plan: ~ 1- 2  days Consults called: None Admission status:  Obs tele   Author: Bethena Roys, MD 02/03/2022 7:34 PM  For on call review [email or web address].

## 2022-02-03 NOTE — Telephone Encounter (Signed)
Pt c/o medication issue:  1. Name of Medication:   cloNIDine (CATAPRES) 0.3 MG tablet  amLODipine (NORVASC) 10 MG tablet  2. How are you currently taking this medication (dosage and times per day)?  As prescribed  3. Are you having a reaction (difficulty breathing--STAT)?   No  4. What is your medication issue?   Wife called stating patient was admitted to hospital and his doctor recommended he be taken off these two medications.  Wife would like to get appointment this week regarding these medication changes.

## 2022-02-03 NOTE — ED Triage Notes (Signed)
Pt presents with dizziness that started around 1330 pm today went to PCP and was found to be hypotensive, sent to ED for further evaluation.

## 2022-02-03 NOTE — Assessment & Plan Note (Signed)
New low EF of 35 to 40%, on echo 01/24/2022.  Subsequent left heart cath without significant coronary artery disease.  Euvolemic today.  -Gentle fluids for hypotension and AKI

## 2022-02-03 NOTE — ED Provider Notes (Addendum)
[facility] EMERGENCY DEPARTMENT Provider Note   CSN: [id number] Arrival date & time: 02/03/22  1429     History  Chief Complaint  Patient presents with   Hypotension    Joseph [name] is a 76 y.o. male.  Patient sent in from Dr. Vickey Sages office for low blood pressure.  Right ear with a pressure of 70/56.  Repeat here was 80 systolic.  Patient stated he was feeling dizzy today.  The appointment was because he was feeling dizzy and just was not feeling quite right.  They referred him here.  Patient followed by cardiology just had recent admission on July 14 through July 18 for placement of a AV pacemaker.  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  History of stroke in the past and history of hypertension.  Patient states that he was started on 2 new medications Coreg and Entresto.  Patient has continued his other medications amlodipine clonidine hydrochlorothiazide.  Patient denies any chest pain or shortness of breath.       Home Medications Prior to Admission medications   Medication Sig Start Date End Date Taking? Authorizing Provider  alfuzosin (UROXATRAL) 10 MG 24 hr tablet Take 1 tablet (10 mg total) by mouth daily with breakfast. 09/23/21  Yes McKenzie, Candee Furbish, MD  amLODipine (NORVASC) 10 MG tablet Take 1 tablet (10 mg total) by mouth in the morning. PATIENT MUST SCHEDULE ANNUAL APPOINTMENT FOR FUTURE REFILLS 02/03/22  Yes Troy Sine, MD  apixaban (ELIQUIS) 5 MG TABS tablet Take 1 tablet (5 mg total) by mouth 2 (two) times daily. 07/18/15  Yes Brett Canales, PA-C  B Complex Vitamins (VITAMIN B COMPLEX) TABS Take 1 tablet by mouth daily.   Yes [provider]  carvedilol (COREG) 6.25 MG tablet Take 1 tablet (6.25 mg) by mouth 2 (two) times daily with a meal. 01/27/22  Yes Baldwin Jamaica, PA-C  cholecalciferol (VITAMIN D) 25 MCG (1000 UT) tablet Take 1,000 Units  by mouth daily.   Yes [provider]  cloNIDine (CATAPRES) 0.3 MG tablet Take 0.3 mg by mouth 2 (two) times daily. 12/29/21  Yes [provider]  doxazosin (CARDURA) 8 MG tablet Take 8 mg by mouth at bedtime.    Yes [provider]  hydrochlorothiazide (HYDRODIURIL) 25 MG tablet Take 25 mg by mouth daily. 01/28/22  Yes [provider]  latanoprost (XALATAN) 0.005 % ophthalmic solution Place 1 drop into both eyes at bedtime as needed (irritation). 09/12/21  Yes [provider]  LORazepam (ATIVAN) 0.5 MG tablet Take 0.5 mg by mouth daily as needed for sleep.   Yes [provider]  Misc Natural Products (PROSTATE HEALTH PO) Take 1 capsule by mouth daily. Super Beta Prostate   Yes [provider]  Multiple Vitamin (MULTIVITAMIN WITH MINERALS) TABS tablet Take 1 tablet by mouth daily.   Yes [provider]  MYRBETRIQ 50 MG TB24 tablet TAKE 1 TABLET(50 MG) BY MOUTH DAILY Patient taking differently: Take 50 mg by mouth daily as needed (frequent urination (take when going out)). 11/10/21  Yes McKenzie, Candee Furbish, MD  Polyethyl Glycol-Propyl Glycol (LUBRICANT EYE DROPS) 0.4-0.3 % SOLN Place 1 drop into both eyes 3 (three) times daily as needed (dry/irritated eyes.).   Yes [provider]  sacubitril-valsartan (ENTRESTO) 24-26 MG Take 1 tablet by mouth 2 (two) times daily. 01/27/22  Yes Baldwin Jamaica, PA-C  saw palmetto 500  MG capsule Take 500 mg by mouth every evening.   Yes [provider]  solifenacin (VESICARE) 5 MG tablet TAKE 1 TABLET(5 MG) BY MOUTH DAILY Patient taking differently: Take 5 mg by mouth daily as needed (frequent urination (take when going out)). 09/23/21  Yes McKenzie, Candee Furbish, MD  spironolactone (ALDACTONE) 25 MG tablet TAKE 1/2 TABLET BY MOUTH EVERY DAY Patient taking differently: Take 12.5 mg by mouth daily. 04/28/21  Yes Troy Sine, MD  Turmeric 500 MG CAPS Take 500 mg by mouth daily.   Yes  [provider]  metoprolol tartrate (LOPRESSOR) 25 MG tablet Take 1/2 tablet (12.'5mg'$ ) to 1 tablet ('25mg'$ ) by mouth as needed for elevated heart rate Patient not taking: Reported on 01/25/2022 10/31/20   Troy Sine, MD      Allergies    Patient has no known allergies.    Review of Systems   Review of Systems  Constitutional:  Negative for chills and fever.  HENT:  Negative for ear pain and sore throat.   Eyes:  Negative for pain and visual disturbance.  Respiratory:  Negative for cough and shortness of breath.   Cardiovascular:  Negative for chest pain and palpitations.  Gastrointestinal:  Negative for abdominal pain and vomiting.  Genitourinary:  Negative for dysuria and hematuria.  Musculoskeletal:  Negative for arthralgias and back pain.  Skin:  Negative for color change and rash.  Neurological:  Positive for light-headedness. Negative for seizures and syncope.  All other systems reviewed and are negative.   Physical Exam Updated Vital Signs BP (!) 145/85   Pulse 61   Temp 98 F (36.7 C) (Oral)   Resp 18   Ht 1.803 m ('5\' 11"'$ )   Wt 95.3 kg   SpO2 100%   BMI 29.29 kg/m  Physical Exam Vitals and nursing note reviewed.  Constitutional:      General: He is not in acute distress.    Appearance: Normal appearance. He is well-developed. He is not ill-appearing.  HENT:     Head: Normocephalic and atraumatic.  Eyes:     Extraocular Movements: Extraocular movements intact.     Conjunctiva/sclera: Conjunctivae normal.     Pupils: Pupils are equal, round, and reactive to light.  Cardiovascular:     Rate and Rhythm: Normal rate and regular rhythm.     Heart sounds: No murmur heard. Pulmonary:     Effort: Pulmonary effort is normal. No respiratory distress.     Breath sounds: Normal breath sounds.  Abdominal:     Palpations: Abdomen is soft.     Tenderness: There is no abdominal tenderness.  Musculoskeletal:        General: No swelling.     Cervical back:  Normal range of motion and neck supple.  Skin:    General: Skin is warm and dry.     Capillary Refill: Capillary refill takes less than 2 seconds.  Neurological:     General: No focal deficit present.     Mental Status: He is alert and oriented to person, place, and time.     Cranial Nerves: No cranial nerve deficit.     Sensory: No sensory deficit.     Motor: No weakness.  Psychiatric:        Mood and Affect: Mood normal.     ED Results / Procedures / Treatments   Labs (all labs ordered are listed, but only abnormal results are displayed) Labs Reviewed  BASIC METABOLIC PANEL - Abnormal; Notable for  the following components:      Result Value   Sodium 132 (*)    Glucose, Bld 108 (*)    BUN 40 (*)    Creatinine, Ser 1.81 (*)    GFR, Estimated 39 (*)    All other components within normal limits  CBC - Abnormal; Notable for the following components:   WBC 3.8 (*)    RBC 6.36 (*)    MCV 69.0 (*)    MCH 21.7 (*)    RDW 16.6 (*)    All other components within normal limits  URINALYSIS, ROUTINE W REFLEX MICROSCOPIC - Abnormal; Notable for the following components:   Color, Urine AMBER (*)    APPearance HAZY (*)    All other components within normal limits  CBG MONITORING, ED - Abnormal; Notable for the following components:   Glucose-Capillary 111 (*)    All other components within normal limits    EKG EKG Interpretation  Date/Time:  Tuesday February 03 2022 14:57:47 EDT Ventricular Rate:  60 PR Interval:  214 QRS Duration: 207 QT Interval:  467 QTC Calculation: 467 R Axis:   237 Text Interpretation: AV dual paced rhythm Borderline prolonged PR interval Probable left atrial enlargement IVCD, consider atypical RBBB Left ventricular hypertrophy Confirmed by Fredia Sorrow [phone]) on 02/03/2022 5:23:52 PM  Radiology DG Chest Port 1 View  Result Date: 02/03/2022 CLINICAL DATA:  Weakness, hypotension EXAM: PORTABLE CHEST 1 VIEW COMPARISON:  01/27/2022 FINDINGS:  Redemonstrated left chest cardiac device with leads in unchanged position. Cardiomegaly. No focal pulmonary opacity. No pleural effusion or pneumothorax. No acute osseous abnormality. IMPRESSION: No acute cardiopulmonary process. Electronically Signed   By: Merilyn Baba M.D.   On: 02/03/2022 15:45    Procedures Procedures    Medications Ordered in ED Medications  0.9 %  sodium chloride infusion ( Intravenous New Bag/Given (Non-Interop) 02/03/22 1532)  sodium chloride 0.9 % bolus 1,000 mL (0 mLs Intravenous Stopped 02/03/22 1704)  sodium chloride 0.9 % bolus 500 mL (0 mLs Intravenous Stopped 02/03/22 1704)    ED Course/ Medical Decision Making/ A&P                           Medical Decision Making Amount and/or Complexity of Data Reviewed Labs: ordered.  Risk Prescription drug management. Decision regarding hospitalization.   Patient's blood pressures rapidly improved here with just a minimal amount of fluid is almost as if some of his medications that he takes twice a day which would have in the morning does have worn off.  Patient never really was particularly symptomatic.  No focal neurodeficit.  Initially was thinking that maybe he could hold his clonidine and hold his amlodipine and see how his pressures did.  But then labs came back.  Urinalysis negative for urinary tract infection.  Basic metabolic panel sodium a little down at 132 glucose 108 creatinine 1.814 GFR 39 which is significantly changed from his GFR greater than 60 not too long ago.  CBC white count 3.8 hemoglobin 13.8 platelets 206.  Chest x-ray without any acute findings.  EKG consistent with AV pacing.  Heart rate around 60.  Which is his paced rhythm.  Due to the significant change in his renal function really acute kidney injury I feel that patient probably ought to be admitted instead of just change his blood pressure medicines.  Do have a call out to cardiology for consultation.  His 2 new medicines are the [facility]  and the Coreg they were just started on July 19.  Patient being treated with IV fluids.  We will continue that for the acute kidney injury.  Final Clinical Impression(s) / ED Diagnoses Final diagnoses:  Hypotension, unspecified hypotension type  AKI (acute kidney injury) [facility])    Rx / DC Orders ED Discharge Orders     None         Fredia Sorrow, MD 02/03/22 1722    Fredia Sorrow, MD 02/03/22 1724

## 2022-02-03 NOTE — Telephone Encounter (Signed)
Noted. Thanks.

## 2022-02-03 NOTE — Assessment & Plan Note (Signed)
Recent paroxysmal atrial fibrillation that spontaneously converted to sinus bradycardia/rhythm.  Hospitalized and had pacemaker placed 7/17 Dr. Lovena Le.  EKG today showing AV dual paced rhythm. Surgical site clean without evidence of infection.

## 2022-02-03 NOTE — Telephone Encounter (Signed)
Spoke with patient and spouse on their speaker phone. Wife stated patient was driven to [facility] ED by PCP for low BP. While on phone with patient/spouse, Dr. Edyth Gunnels said he was admitting the patient.

## 2022-02-03 NOTE — Assessment & Plan Note (Addendum)
Creatinine elevated at 1.8, baseline 1.1-1.4.  Likely secondary to hypotension from antihypertensive medications and Entresto, HCTZ. -1.5 L bolus given, continue N/s 75cc/hr x 10hrs -Hold HCTZ, Entresto, spironolactone

## 2022-02-04 DIAGNOSIS — N179 Acute kidney failure, unspecified: Secondary | ICD-10-CM | POA: Diagnosis not present

## 2022-02-04 LAB — BASIC METABOLIC PANEL
Anion gap: 6 (ref 5–15)
BUN: 35 mg/dL — ABNORMAL HIGH (ref 8–23)
CO2: 25 mmol/L (ref 22–32)
Calcium: 8.6 mg/dL — ABNORMAL LOW (ref 8.9–10.3)
Chloride: 107 mmol/L (ref 98–111)
Creatinine, Ser: 1.37 mg/dL — ABNORMAL HIGH (ref 0.61–1.24)
GFR, Estimated: 54 mL/min — ABNORMAL LOW (ref >60)
Glucose, Bld: 109 mg/dL — ABNORMAL HIGH (ref 70–99)
Potassium: 4.5 mmol/L (ref 3.5–5.1)
Sodium: 138 mmol/L (ref 135–145)

## 2022-02-04 LAB — MRSA NEXT GEN BY PCR, NASAL: MRSA by PCR Next Gen: NOT DETECTED

## 2022-02-04 MED ORDER — ACETAMINOPHEN 325 MG PO TABS: 650.0000 mg | ORAL_TABLET | Freq: Four times a day (QID) | ORAL | 0 refills | Status: DC | PRN

## 2022-02-04 MED ORDER — DOXAZOSIN MESYLATE 8 MG PO TABS: 4.0000 mg | ORAL_TABLET | Freq: Every day | ORAL | 4 refills | Status: DC

## 2022-02-04 MED ORDER — SPIRONOLACTONE 25 MG PO TABS: 12.5000 mg | ORAL_TABLET | Freq: Every day | ORAL | 7 refills | Status: DC

## 2022-02-04 MED ORDER — LATANOPROST 0.005 % OP SOLN: 1.0000 [drp] | Freq: Every day | OPHTHALMIC | 12 refills | Status: AC

## 2022-02-04 MED ORDER — CLONIDINE HCL 0.1 MG PO TABS: 0.1000 mg | ORAL_TABLET | Freq: Two times a day (BID) | ORAL | 2 refills | Status: DC

## 2022-02-04 NOTE — Discharge Instructions (Signed)
1)Very low-salt diet advised 2)Weigh yourself daily, call if you gain more than 3 pounds in 1 day or more than 5 pounds in 1 week as your diuretic medications may need to be adjusted 3)Limit your Fluid  intake to no more than 60 ounces (1.8 Liters) per day 4)Avoid ibuprofen/Advil/Aleve/Motrin/Goody Powders/Naproxen/BC powders/Meloxicam/Diclofenac/Indomethacin and other Nonsteroidal anti-inflammatory medications as these will make you more likely to bleed and can cause stomach ulcers, can also cause Kidney problems.  5)Stop Amlodipine/Norvasc 6)Decrease Clonidine to 0.1 mg Twice Daily 7)Decrease Spironolactone/Aldactone to 12.5 mg (half tablet) 8)Decrease Cardura/Doxazosin-4 mg at bedtime from 8 mg 9)Repeat BMP on Monday 02/09/22.Marland KitchenMarland KitchenMarland Kitchen

## 2022-02-04 NOTE — Progress Notes (Signed)
Patient alert and oriented x4. NO complaints of pain, shortness of breath, chest pain, dizziness, nausea or vomiting. Patient up out of bed to chair and commode independently with steady gait. Dressing to left side upper chest is currently clean, dry and intact. IV removed with no complications. Patient tolerated PO meds and diet well. Appetite good. Went over discharge summary/instructions, follow up information and medication education with patient and patient spouse. All questions answered and both patient and wife expressed full understanding of instructions/summary, follow up info and medication education which includes what med changes there are and what medications to stop taking (amlodipine & metoprolol per summary). Both provided teach back to confirm understanding. Patient discharged with all belongings for home via car.

## 2022-02-04 NOTE — TOC Progression Note (Signed)
  Transition of Care [facility]) Screening Note   Patient Details  Name: Joseph [name] Date of Birth: April 26, 1946   [facility]) CM/SW Contact:    Boneta Lucks, RN Phone Number: 02/04/2022, 12:37 PM    Transition of Care Department [facility]) has reviewed patient and no TOC needs have been identified at this time. We will continue to monitor patient advancement through interdisciplinary progression rounds. If new patient transition needs arise, please place a TOC consult.      Barriers to Discharge: No Barriers Identified  [email or web address] d Discharge Plan and Services         Expected Discharge Date: 02/04/22

## 2022-02-04 NOTE — Discharge Summary (Signed)
Joseph [name], is a 76 y.o. male  DOB June 28, 1946  MRN [id number].  Admission date:  02/03/2022  Admitting Physician  Bethena Roys, MD  Discharge Date:  02/04/2022   Primary MD  Lemmie Evens, MD  Recommendations for primary care physician for things to follow:   1)Very low-salt diet advised 2)Weigh yourself daily, call if you gain more than 3 pounds in 1 day or more than 5 pounds in 1 week as your diuretic medications may need to be adjusted 3)Limit your Fluid  intake to no more than 60 ounces (1.8 Liters) per day 4)Avoid ibuprofen/Advil/Aleve/Motrin/Goody Powders/Naproxen/BC powders/Meloxicam/Diclofenac/Indomethacin and other Nonsteroidal anti-inflammatory medications as these will make you more likely to bleed and can cause stomach ulcers, can also cause Kidney problems.  5)Stop Amlodipine/Norvasc 6)Decrease Clonidine to 0.1 mg Twice Daily 7)Decrease Spironolactone/Aldactone to 12.5 mg (half tablet) 8)Decrease Cardura/Doxazosin-4 mg at bedtime from 8 mg 9)Repeat BMP on Monday 02/09/22....   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   grade 1   Hypertension    New onset atrial fibrillation ([place]) 06/2015   RBBB    Sinus bradycardia    Stroke ([place])    no defecits, pt denies having a stroke, says [name] diagnosed this    Past Surgical History:  Procedure Laterality Date    BIV PACEMAKER INSERTION CRT-P N/A 01/26/2022   Procedure: BIV PACEMAKER INSERTION CRT-P;  Surgeon: Evans Lance, MD;  Location: [facility];  Service: Cardiovascular;  Laterality: N/A;   COLONOSCOPY     COLONOSCOPY  12/30/2011   Procedure: COLONOSCOPY;  Surgeon: [name], MD;  [facility];  Service: Endoscopy;  Laterality: N/A;  1:45PM   COLONOSCOPY N/A 04/19/2019   Procedure: COLONOSCOPY;  Surgeon: [name], MD;  [facility];  Service: Endoscopy;  Laterality: N/A;  2:00pm   EXCISION MASS LOWER EXTREMETIES Left 05/12/2018   Procedure: Left foot plantar fibroma excision;  Surgeon: [name], MD;  Location: [facility];  Service: Orthopedics;  Laterality: Left;   FOOT SURGERY Left    LEFT HEART CATH AND CORONARY ANGIOGRAPHY N/A 01/26/2022   Procedure: LEFT HEART CATH AND CORONARY ANGIOGRAPHY;  Surgeon: [place] Sine, MD;  Location: [facility];  Service: Cardiovascular;  Laterality: N/A;   SPERMATOCELECTOMY Right 11/13/2019   Procedure: EXCISION OF SPERMATIC CORD CYST;  Surgeon: [name], MD;  Location: [facility];  Service: Urology;  Laterality: Right;   VASECTOMY  11/13/2019  Procedure: VASECTOMY;  Surgeon: [name], MD;  Location: [facility];  Service: Urology;;     HPI  from the history and physical done on the day of admission:     Chief Complaint: Dizziness   HPI: Joseph [name] is a 76 y.o. 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 Patient presented to the ED today with reports of hypotension and dizziness.  Patient reports feeling woozy 2 days ago, blood pressure was 129/71 then, today dizziness returned and was more severe, blood pressure was checked document was 95/53.  He went to see his primary care provider and blood pressure was 70/56.  No vomiting no loose stools he has maintained good oral  intake.   Recent hospitalization at [facility] 7/14 to 7/18 under cardiology service, for initial atrial fibrillation with RVR that transition to sinus bradycardia/junctional rhythm spontaneously.  Also had an AKI.  EP was consulted for tachybradycardia syndrome.  TTE showed new low EF of 35 to 40%, so left heart cath was done and was without obstructive coronary artery disease, he underwent implantation of CRT pacer, By Dr. Lovena Le.  Tolerated procedure well.   On discharge, he was started on Coreg and his irbesartan was switched to [place].  His mental laxity was discontinued and there was a plan to titrate off his clonidine, and advance his GDMT.   Patient's wife is at bedside, she tells me patient has been taking his old medications which include Norvasc,  clonidine, HCTZ, spironolactone, also tells me he takes metoprolol.  In addition to new medications Coreg and Entresto.   ED Course: Blood pressure initially 70/56, improved to 150s after 1.5 L bolus given.  Heart rate 59-61. Creatinine elevated 1.8. EDP consult cardiology agreed with hospitalization.   Review of Systems: As per HPI all other systems reviewed and negative.     Hospital Course:    Assessment and Plan: 1)AKI (acute kidney injury) ([place]) Creatinine elevated at 1.8,  baseline 1.1-1.4.   Likely secondary to hypotension from antihypertensive medications and Entresto, HCTZ. -Creatinine is down to 1.37 from 1.81 after hydration and medication adjustments -Okay to discharge home with the following medication changes:- Stop Amlodipine/Norvasc due to hypotension concerns Decrease Clonidine to 0.1 mg Twice Daily due to hypotension concern Decrease Spironolactone/Aldactone to 12.5 mg (half tablet) due to renal dysfunction and hypotension concern -Decrease Cardura/Doxazosin-4 mg at bedtime from 8 mg due to BP concerns Repeat BMP on Monday 02/09/22....   2)NICM (nonischemic cardiomyopathy) ([place]) New low EF of 35 to 40%, on  echo 01/24/2022.  Subsequent left heart cath without significant coronary artery disease.   -Was dehydrated on admission received IV fluids  appears euvolemic at the time of discharge - 3)Sinus node dysfunction (HCC) Recent paroxysmal atrial fibrillation that spontaneously converted to sinus bradycardia/rhythm.  Hospitalized and had pacemaker placed 7/17 Dr. Lovena Le.   -Surgical site clean without evidence of infection.  4)HTN (hypertension) -Had persistent hypotension on admission- Improved with medication adjustments and IV fluids -EF is on the low side as noted above #2 -Blood pressure discharge medications adjustments as above #1  Discharge Condition: stable  Follow UP   Follow-up Information     [place] Sine, MD. Schedule an appointment as soon as possible for a visit in 1 month(s).   Specialty: Cardiology Contact information: [address] [place] [address] [phone]         Lemmie Evens, MD. Schedule an  appointment as soon as possible for a visit on 02/09/2022.   Specialty: Family Medicine Why: BMP check Contact information: [place] [address] [phone]         [place] Sine, MD .   Specialty: Cardiology Contact information: [address] [place] [address] [phone]                 Diet and Activity recommendation:  As advised  Discharge Instructions    Discharge Instructions     Call MD for:  difficulty breathing, headache or visual disturbances   Complete by: As directed    Call MD for:  persistant dizziness or light-headedness   Complete by: As directed    Call MD for:  persistant nausea and vomiting   Complete by: As directed    Call MD for:  temperature >100.4   Complete by: As directed    Diet - low sodium heart healthy   Complete by: As directed    Discharge instructions   Complete by: As directed    1)Very low-salt diet advised 2)Weigh yourself daily, call  if you gain more than 3 pounds in 1 day or more than 5 pounds in 1 week as your diuretic medications may need to be adjusted 3)Limit your Fluid  intake to no more than 60 ounces (1.8 Liters) per day 4)Avoid ibuprofen/Advil/Aleve/Motrin/Goody Powders/Naproxen/BC powders/Meloxicam/Diclofenac/Indomethacin and other Nonsteroidal anti-inflammatory medications as these will make you more likely to bleed and can cause stomach ulcers, can also cause Kidney problems.  5)Stop Amlodipine/Norvasc 6)Decrease Clonidine to 0.1 mg Twice Daily 7)Decrease Spironolactone/Aldactone to 12.5 mg (half tablet) 8)Decrease Cardura/Doxazosin-4 mg at bedtime from 8 mg 9)Repeat BMP on Monday 02/09/22....   Discharge wound care:   Complete by: As directed    Keep wound clean and dry   Increase activity slowly   Complete by: As directed          Discharge Medications     Allergies as of 02/04/2022   No Known Allergies      Medication List     STOP taking these medications    amLODipine 10 MG tablet Commonly known as: NORVASC   metoprolol tartrate 25 MG tablet Commonly known as: LOPRESSOR       TAKE these medications    acetaminophen 325 MG tablet Commonly known as: TYLENOL Take 2 tablets (650 mg total) by mouth every 6 (six) hours as needed for mild pain, fever or headache (or Fever >/= 101).   alfuzosin 10 MG 24 hr tablet Commonly known as: UROXATRAL Take 1 tablet (10 mg total) by mouth daily with breakfast.   apixaban 5 MG Tabs tablet Commonly known as: Eliquis Take 1 tablet (5 mg total) by mouth 2 (two) times daily.   carvedilol 6.25 MG tablet Commonly known as: COREG Take 1 tablet (6.25 mg) by mouth 2 (two) times daily with a meal.   cholecalciferol 25 MCG (1000 UNIT) tablet Commonly known as: VITAMIN D Take 1,000 Units by mouth daily.   cloNIDine 0.1 MG tablet Commonly known as: Catapres Take 1 tablet (0.1 mg total) by mouth 2 (two) times daily. What changed:  medication  strength how much to take   doxazosin 8 MG tablet Commonly known as: CARDURA Take 0.5 tablets (4 mg total) by mouth at bedtime. What changed: how much to take   Entresto 24-26 MG Generic drug: sacubitril-valsartan Take 1 tablet by mouth 2 (two) times daily.   hydrochlorothiazide 25 MG tablet Commonly  known as: HYDRODIURIL Take 25 mg by mouth daily.   latanoprost 0.005 % ophthalmic solution Commonly known as: XALATAN Place 1 drop into both eyes at bedtime. What changed:  when to take this reasons to take this   LORazepam 0.5 MG tablet Commonly known as: ATIVAN Take 0.5 mg by mouth daily as needed for sleep.   Lubricant Eye Drops 0.4-0.3 % Soln Generic drug: Polyethyl Glycol-Propyl Glycol Place 1 drop into both eyes 3 (three) times daily as needed (dry/irritated eyes.).   multivitamin with minerals Tabs tablet Take 1 tablet by mouth daily.   Myrbetriq 50 MG Tb24 tablet Generic drug: mirabegron ER TAKE 1 TABLET(50 MG) BY MOUTH DAILY What changed: See the new instructions.   PROSTATE HEALTH PO Take 1 capsule by mouth daily. Super Beta Prostate   saw palmetto 500 MG capsule Take 500 mg by mouth every evening.   solifenacin 5 MG tablet Commonly known as: VESICARE TAKE 1 TABLET(5 MG) BY MOUTH DAILY What changed:  how much to take how to take this when to take this reasons to take this additional instructions   spironolactone 25 MG tablet Commonly known as: ALDACTONE Take 0.5 tablets (12.5 mg total) by mouth daily.   Turmeric 500 MG Caps Take 500 mg by mouth daily.   Vitamin B Complex Tabs Take 1 tablet by mouth daily.               Discharge Care Instructions  (From admission, onward)           Start     Ordered   02/04/22 0000  Discharge wound care:       Comments: Keep wound clean and dry   02/04/22 1246            Major procedures and Radiology Reports - PLEASE review detailed and final reports for all details, in brief -   DG  Chest Port 1 View  Result Date: 02/03/2022 CLINICAL DATA:  Weakness, hypotension EXAM: PORTABLE CHEST 1 VIEW COMPARISON:  01/27/2022 FINDINGS: Redemonstrated left chest cardiac device with leads in unchanged position. Cardiomegaly. No focal pulmonary opacity. No pleural effusion or pneumothorax. No acute osseous abnormality. IMPRESSION: No acute cardiopulmonary process. Electronically Signed   By: Merilyn Baba M.D.   On: 02/03/2022 15:45   DG Chest 2 View  Result Date: 01/27/2022 CLINICAL DATA:  Status post pacemaker EXAM: CHEST - 2 VIEW COMPARISON:  01/23/2022 FINDINGS: Cardiomegaly. Interval placement of left chest multi lead pacer, leads projecting in the generally expected vicinity of the right atrial appendage, right ventricle, and coronary sinus. Both lungs are clear. Disc degenerative disease of the thoracic spine. IMPRESSION: Cardiomegaly with interval placement of left chest multi lead pacer. No acute abnormality of the lungs. No pneumothorax. Electronically Signed   By: Delanna Ahmadi M.D.   On: 01/27/2022 08:26   EP PPM/ICD IMPLANT  Result Date: 01/26/2022 CONCLUSIONS: 1. Successful implantation of a [employer] BiV pacemaker for symptomatic sinus node dysfunction, and atrial fib with a RVR, and high grade AV block 2. No early apparent complications. Cristopher Peru, MD 8:41 PM 01/26/2022   CARDIAC CATHETERIZATION  Result Date: 01/26/2022   There is moderate left ventricular systolic dysfunction.   LV end diastolic pressure is normal. No significant coronary obstructive disease.  There is minimal superior calcification in the distal left main.  The LAD, large ramus intermediate vessel, dominant left circumflex vessel and nondominant RCA are angiographically normal. With the patient's history of symptomatic tachybradycardia syndrome with episodes  of marked bradycardia, as well as PAF and atrial flutter with one-to-one conduction with rapid rate, he will undergo pacemaker insertion by Dr. Lovena Le  following the catheterization procedure. RECOMMENDATION: GDMT for reduced LV function with medication adjustment following biventricular pacemaker insertion.   ECHOCARDIOGRAM COMPLETE  Result Date: 01/24/2022    ECHOCARDIOGRAM REPORT   Patient Name:   MURLIN SCHRIEBER [facility] Date of Exam: 01/24/2022 Medical Rec #:  [id number]         Height:       70.0 in Accession #:    [id number]        Weight:       216.0 lb Date of Birth:  [birth year]          BSA:          2.157 m Patient Age:    21 years          BP:           149/71 mmHg Patient Gender: M                 HR:           51 bpm. Exam Location:  Inpatient Procedure: 2D Echo Indications:    Atrial fibrillation  History:        Patient has prior history of Echocardiogram examinations, most                 recent 08/13/2020. Abnormal ECG, Arrythmias:Atrial Fibrillation,                 Atrial Flutter and RBBB; Risk Factors:Hypertension.  Sonographer:    Johny Chess RDCS Referring Phys: [id number] [place]  1. Left ventricular ejection fraction, by estimation, is 35 to 40%. The left ventricle has moderately decreased function. The left ventricle demonstrates global hypokinesis. The left ventricular internal cavity size was mildly dilated. Left ventricular diastolic function could not be evaluated. There is moderate hypokinesis of the left ventricular, mid-apical inferior wall, inferolateral wall and anterolateral wall.  2. Right ventricular systolic function is normal. The right ventricular size is normal.  3. Left atrial size was moderately dilated.  4. The mitral valve is normal in structure. Trivial mitral valve regurgitation. No evidence of mitral stenosis.  5. The aortic valve is tricuspid. There is mild calcification of the aortic valve. There is mild thickening of the aortic valve. Aortic valve regurgitation is not visualized. Aortic valve sclerosis/calcification is present, without any evidence of aortic stenosis.  6. The inferior vena cava is  dilated in size with <50% respiratory variability, suggesting right atrial pressure of 15 mmHg. Comparison(s): Changes from prior study are noted. Conclusion(s)/Recommendation(s): Since prior echo, both global hypokinesis and focal wall motion abnormalities of the LV are present. Most prominent WMA are in the mid to apical inferior and lateral wall. FINDINGS  Left Ventricle: Left ventricular ejection fraction, by estimation, is 35 to 40%. The left ventricle has moderately decreased function. The left ventricle demonstrates global hypokinesis. Moderate hypokinesis of the left ventricular, mid-apical inferior wall, inferolateral wall and anterolateral wall. The left ventricular internal cavity size was mildly dilated. There is borderline left ventricular hypertrophy. Left ventricular diastolic function could not be evaluated due to nondiagnostic images. Left ventricular diastolic function could not be evaluated. Right Ventricle: The right ventricular size is normal. No increase in right ventricular wall thickness. Right ventricular systolic function is normal. Left Atrium: Left atrial size was moderately dilated. Right Atrium: Right atrial size was normal in size. Pericardium: There  is no evidence of pericardial effusion. Mitral Valve: The mitral valve is normal in structure. Trivial mitral valve regurgitation. No evidence of mitral valve stenosis. Tricuspid Valve: The tricuspid valve is normal in structure. Tricuspid valve regurgitation is trivial. No evidence of tricuspid stenosis. Aortic Valve: The aortic valve is tricuspid. There is mild calcification of the aortic valve. There is mild thickening of the aortic valve. Aortic valve regurgitation is not visualized. Aortic valve sclerosis/calcification is present, without any evidence of aortic stenosis. Pulmonic Valve: The pulmonic valve was grossly normal. Pulmonic valve regurgitation is trivial. No evidence of pulmonic stenosis. Aorta: Aortic root could not be  assessed and the aortic root, ascending aorta, aortic arch and descending aorta are all structurally normal, with no evidence of dilitation or obstruction. Venous: The inferior vena cava is dilated in size with less than 50% respiratory variability, suggesting right atrial pressure of 15 mmHg. IAS/Shunts: The atrial septum is grossly normal.  LEFT VENTRICLE PLAX 2D LVIDd:         5.90 cm LVIDs:         4.80 cm LV PW:         1.20 cm LV IVS:        1.10 cm LVOT diam:     1.90 cm LVOT Area:     2.84 cm  RIGHT VENTRICLE             IVC RV S prime:     11.30 cm/s  IVC diam: 2.40 cm TAPSE (M-mode): 2.6 cm LEFT ATRIUM           Index        RIGHT ATRIUM           Index LA diam:      3.60 cm 1.67 cm/m   RA Area:     18.80 cm LA Vol (A4C): 99.6 ml 46.18 ml/m  RA Volume:   49.80 ml  23.09 ml/m   AORTA Ao Root diam: 2.90 cm Ao Asc diam:  3.20 cm  SHUNTS Systemic Diam: 1.90 cm Buford Dresser MD Electronically signed by Buford Dresser MD Signature Date/Time: 01/24/2022/5:52:50 PM    Final    DG Chest Portable 1 View  Result Date: 01/23/2022 CLINICAL DATA:  Dyspnea EXAM: PORTABLE CHEST 1 VIEW COMPARISON:  11/06/2019 FINDINGS: Stable mild cardiomegaly. No focal airspace consolidation, pleural effusion, or pneumothorax. No acute bony findings. IMPRESSION: No active disease. Electronically Signed   By: Davina Poke D.O.   On: 01/23/2022 18:49    Micro Results   Recent Results (from the past 240 hour(s))  Surgical PCR screen     Status: None   Collection Time: 01/25/22  2:37 PM   Specimen: Nasal Mucosa; Nasal Swab  Result Value Ref Range Status   MRSA, PCR NEGATIVE NEGATIVE Final   Staphylococcus aureus NEGATIVE NEGATIVE Final    Comment: (NOTE) The Xpert SA Assay (FDA approved for NASAL specimens in patients 24 years of age and older), is one component of a comprehensive surveillance program. It is not intended to diagnose infection nor to guide or monitor treatment. Performed at [facility], [place] [address], [place], [place] [address]   MRSA Next Gen by PCR, Nasal     Status: None   Collection Time: 02/03/22  6:40 PM   Specimen: Nasal Mucosa; Nasal Swab  Result Value Ref Range Status   MRSA by PCR Next Gen NOT DETECTED NOT DETECTED Final    Comment: (NOTE) The GeneXpert MRSA Assay (FDA  approved for NASAL specimens only), is one component of a comprehensive MRSA colonization surveillance program. It is not intended to diagnose MRSA infection nor to guide or monitor treatment for MRSA infections. Test performance is not FDA approved in patients less than 49 years old. Performed at [facility], [address] [place], [place] [address]     Today   Subjective    Joseph [name] today has no new complaints  No fever  Or chills   No Nausea, Vomiting or Diarrhea       Patient has been seen and examined prior to discharge   Objective   Blood pressure 134/66, pulse 61, temperature 98.1 F (36.7 C), temperature source Oral, resp. rate 20, height '5\' 10"'$  (1.778 m), weight 94.2 kg, SpO2 98 %.   Intake/Output Summary (Last 24 hours) at 02/04/2022 1249 Last data filed at 02/04/2022 0600 Gross per 24 hour  Intake 956.52 ml  Output 1425 ml  Net -468.48 ml    Exam Gen:- Awake Alert, no acute distress  HEENT:- [place].AT, No sclera icterus Neck-Supple Neck,No JVD,.  Lungs-  CTAB , good air movement bilaterally CV- S1, S2 normal, regular, pacemaker site appears to be healing nicely Abd-  +ve B.Sounds, Abd Soft, No tenderness,    Extremity/Skin:- No  edema,   good pulses Psych-affect is appropriate, oriented x3 Neuro-no new focal deficits, no tremors    Data Review   CBC w Diff:  Lab Results  Component Value Date   WBC 3.8 (L) 02/03/2022   HGB 13.8 02/03/2022   HGB 13.2 08/15/2019   HCT 43.9 02/03/2022   HCT 41.7 08/15/2019   HCT 39 10/22/2011   PLT 206 02/03/2022   PLT 242 08/15/2019   LYMPHOPCT 34 01/23/2022   MONOPCT 9 01/23/2022   EOSPCT 2  01/23/2022   BASOPCT 1 01/23/2022    CMP:  Lab Results  Component Value Date   NA 138 02/04/2022   NA 138 08/15/2019   K 4.5 02/04/2022   K 4.2 10/22/2011   CL 107 02/04/2022   CL 106 10/22/2011   CO2 25 02/04/2022   CO2 27 10/22/2011   BUN 35 (H) 02/04/2022   BUN 15 08/15/2019   CREATININE 1.37 (H) 02/04/2022   CREATININE 0.97 10/22/2011   PROT 6.4 (L) 01/23/2022   PROT 7.0 08/15/2019   ALBUMIN 3.5 01/23/2022   ALBUMIN 4.4 08/15/2019   BILITOT 1.2 01/23/2022   BILITOT 1.1 08/15/2019   BILITOT 0.4 09/11/2011   ALKPHOS 70 01/23/2022   ALKPHOS 97 09/11/2011   AST 21 01/23/2022   AST 24 09/11/2011   ALT 17 01/23/2022  .  Total Discharge time is about 33 minutes  Roxan Hockey M.D on 02/04/2022 at 12:49 PM  Go to [email or web address] -  for contact info  [facility] - Office  [phone]

## 2022-02-11 ENCOUNTER — Ambulatory Visit: Payer: Medicare PPO | Admitting: Physician Assistant

## 2022-02-11 ENCOUNTER — Ambulatory Visit (INDEPENDENT_AMBULATORY_CARE_PROVIDER_SITE_OTHER): Payer: Medicare PPO

## 2022-02-11 ENCOUNTER — Telehealth: Payer: Self-pay | Admitting: Cardiovascular Disease

## 2022-02-11 ENCOUNTER — Other Ambulatory Visit: Payer: Medicare PPO

## 2022-02-11 DIAGNOSIS — Z79899 Other long term (current) drug therapy: Secondary | ICD-10-CM

## 2022-02-11 DIAGNOSIS — I495 Sick sinus syndrome: Secondary | ICD-10-CM | POA: Diagnosis not present

## 2022-02-11 DIAGNOSIS — I480 Paroxysmal atrial fibrillation: Secondary | ICD-10-CM | POA: Diagnosis not present

## 2022-02-11 DIAGNOSIS — Z950 Presence of cardiac pacemaker: Secondary | ICD-10-CM

## 2022-02-11 LAB — CUP PACEART INCLINIC DEVICE CHECK
Battery Remaining Longevity: 42 mo
Battery Voltage: 3.1 V
Brady Statistic RA Percent Paced: 64 %
Brady Statistic RV Percent Paced: 90 %
Date Time Interrogation Session: 20230802093400
Implantable Lead Implant Date: 20230717
Implantable Lead Implant Date: 20230717
Implantable Lead Implant Date: 20230717
Implantable Lead Location: 753858
Implantable Lead Location: 753859
Implantable Lead Location: 753860
Implantable Pulse Generator Implant Date: 20230717
Lead Channel Impedance Value: 312.5 Ohm
Lead Channel Impedance Value: 512.5 Ohm
Lead Channel Impedance Value: 587.5 Ohm
Lead Channel Pacing Threshold Amplitude: 0.75 V
Lead Channel Pacing Threshold Amplitude: 0.75 V
Lead Channel Pacing Threshold Amplitude: 0.75 V
Lead Channel Pacing Threshold Amplitude: 0.75 V
Lead Channel Pacing Threshold Amplitude: 1.25 V
Lead Channel Pacing Threshold Amplitude: 1.25 V
Lead Channel Pacing Threshold Pulse Width: 0.5 ms
Lead Channel Pacing Threshold Pulse Width: 0.5 ms
Lead Channel Pacing Threshold Pulse Width: 0.5 ms
Lead Channel Pacing Threshold Pulse Width: 0.5 ms
Lead Channel Pacing Threshold Pulse Width: 0.5 ms
Lead Channel Pacing Threshold Pulse Width: 0.5 ms
Lead Channel Sensing Intrinsic Amplitude: 10.9 mV
Lead Channel Sensing Intrinsic Amplitude: 5 mV
Lead Channel Setting Pacing Amplitude: 3.5 V
Lead Channel Setting Pacing Amplitude: 3.5 V
Lead Channel Setting Pacing Amplitude: 3.5 V
Lead Channel Setting Pacing Pulse Width: 0.5 ms
Lead Channel Setting Pacing Pulse Width: 0.5 ms
Lead Channel Setting Sensing Sensitivity: 2 mV
Pulse Gen Model: 3562
Pulse Gen Serial Number: 8094736

## 2022-02-11 LAB — BASIC METABOLIC PANEL
BUN/Creatinine Ratio: 20 (ref 10–24)
BUN: 32 mg/dL — ABNORMAL HIGH (ref 8–27)
CO2: 25 mmol/L (ref 20–29)
Calcium: 9.2 mg/dL (ref 8.6–10.2)
Chloride: 103 mmol/L (ref 96–106)
Creatinine, Ser: 1.58 mg/dL — ABNORMAL HIGH (ref 0.76–1.27)
Glucose: 98 mg/dL (ref 70–99)
Potassium: 4.4 mmol/L (ref 3.5–5.2)
Sodium: 142 mmol/L (ref 134–144)
eGFR: 46 mL/min/{1.73_m2} — ABNORMAL LOW (ref >59)

## 2022-02-11 NOTE — Progress Notes (Signed)
Wound check appointment. Steri-strips removed. Wound without redness or edema. Incision edges approximated, wound well healed. Normal device function. Thresholds, sensing, and impedances consistent with implant measurements. Device programmed at 3.5V/auto capture programmed on for extra safety margin until 3 month visit. Histogram distribution appropriate for patient and level of activity.  AT/AF burden 2.3%, + Eliquis, No high ventricular rates noted. Patient educated about wound care, arm mobility, lifting restrictions. ROV in 3 months with implanting physician.

## 2022-02-11 NOTE — Telephone Encounter (Signed)
Routed to [facility] LPN as Juluis Rainier

## 2022-02-11 NOTE — Patient Instructions (Signed)
   After Your Pacemaker   Monitor your pacemaker site for redness, swelling, and drainage. Call the device clinic at [phone] if you experience these symptoms or fever/chills.  Your incision was closed with Steri-strips or staples:  You may shower 7 days after your procedure and wash your incision with soap and water. Avoid lotions, ointments, or perfumes over your incision until it is well-healed.  You may use a hot tub or a pool after your wound check appointment if the incision is completely closed.  Do not lift, push or pull greater than 10 pounds with the affected arm until 6 weeks after your procedure. There are no other restrictions in arm movement after your wound check appointment. UNTIL AFTER AUGUST 28th.   You may drive, unless driving has been restricted by your healthcare providers.  Your Pacemaker is MRI compatible.  Remote monitoring is used to monitor your pacemaker from home. This monitoring is scheduled every 91 days by our office. It allows [place] to keep an eye on the functioning of your device to ensure it is working properly. You will routinely see your Electrophysiologist annually (more often if necessary).

## 2022-02-11 NOTE — Telephone Encounter (Signed)
Joseph [name] from Dr. Newt Minion office called with pt lab results from yesterday 02/10/22. She states she will also fax over the results to [phone]  BUN - 31 Creatine - 1.71 EGFR - 42

## 2022-02-17 NOTE — Progress Notes (Unsigned)
Cardiology Office Note:    Date:  02/18/2022   ID:  Joseph [name], DOB [birth year], MRN [id number]  PCP:  Lemmie Evens, [place] Cardiologist: Shelva Majestic, MD   Reason for visit: Hospital follow-up  History of Present Illness:    Joseph [name] is a 76 y.o. male with a hx of hypertension, PAF on Eliquis, right bundle branch block, history of stroke, NSVT, celiac artery stenosis, SMA stenosis, tachybradycardia syndrome requiring pacemaker, and nonischemic cardiomyopathy .  He last saw [name] in February 2023.  Patient admitted to not taking his blood pressure medication consistently and had occasional blood pressure spikes.  He was recommended to take amlodipine, hydrochlorothiazide and spironolactone during the day and take the doxazosin, irbesartan and minoxidil at night.  He was admitted to the hospital July 14 with symptomatic tachy-brady syndrome, feeling woozy.  He had atrial fibrillation with RVR with transition to sinus bradycardia/junctional rhythm. TTE showed new low EF of 35 to 40%.  Left heart cath showed no obstructive CAD.  He underwent CRT pacer by Dr. Lovena Le.  On discharge, he was started on Coreg and his irbesartan was switched to [place].  He was admitted to the hospital February 03, 2022 with hypotension & dizziness, BP 95/53 and at recheck at PCP 70/56.  It appears that patient was taking his old medications as well as new ones from 7/14 admit.  Patient had AKI with creatinine 1.8 with baseline 1.1-1.4.  Amlodipine discontinued.  Clonidine decreased to 0.1 mg twice daily.  Spironolactone decreased to 12.5 mg.  Doxazosin decreased from '8mg'$  to 4 mg.  He was given IVF.  Recheck Cr 1.58 on 8/2.  Today, patient states has had no further lightheadedness.  He states it is normal for him to have a systolic blood pressure 157W.  They bring a blood pressure log showing blood pressures in the 130s to 140s but sometimes reaching 160s to 170s.  Patient denies heart  failure symptoms including PND, orthopnea, lower extremity edema and shortness of breath.  Denies chest pain and palpitations.      Past Medical History:  Diagnosis Date   1st degree AV block    Atrial fibrillation (HCC)    Diastolic dysfunction 6/20   grade 1   Hypertension    New onset atrial fibrillation ([place]) 06/2015   RBBB    Sinus bradycardia    Stroke ([place])    no defecits, pt denies having a stroke, says [name] diagnosed this    Past Surgical History:  Procedure Laterality Date   BIV PACEMAKER INSERTION CRT-P N/A 01/26/2022   Procedure: BIV PACEMAKER INSERTION CRT-P;  Surgeon: Evans Lance, MD;  Location: [facility];  Service: Cardiovascular;  Laterality: N/A;   COLONOSCOPY     COLONOSCOPY  12/30/2011   Procedure: COLONOSCOPY;  Surgeon: [name], MD;  [facility];  Service: Endoscopy;  Laterality: N/A;  1:45PM   COLONOSCOPY N/A 04/19/2019   Procedure: COLONOSCOPY;  Surgeon: [name], MD;  [facility];  Service: Endoscopy;  Laterality: N/A;  2:00pm   EXCISION MASS LOWER EXTREMETIES Left 05/12/2018   Procedure: Left foot plantar fibroma excision;  Surgeon: [name], MD;  Location: [facility];  Service: Orthopedics;  Laterality: Left;   FOOT SURGERY Left    LEFT HEART CATH AND CORONARY ANGIOGRAPHY N/A 01/26/2022   Procedure: LEFT HEART CATH AND CORONARY ANGIOGRAPHY;  Surgeon: Troy Sine, MD;  Location: [facility];  Service: Cardiovascular;  Laterality: N/A;   SPERMATOCELECTOMY Right 11/13/2019   Procedure: EXCISION OF SPERMATIC CORD CYST;  Surgeon: [name], MD;  Location: [facility];  Service: Urology;  Laterality: Right;   VASECTOMY  11/13/2019   Procedure: VASECTOMY;  Surgeon: [name], MD;  Location: [facility];  Service: Urology;;    Current Medications: Current Meds  Medication Sig   acetaminophen (TYLENOL) 325 MG tablet Take 2 tablets (650 mg total) by mouth every 6 (six) hours  as needed for mild pain, fever or headache (or Fever >/= 101).   alfuzosin (UROXATRAL) 10 MG 24 hr tablet Take 1 tablet (10 mg total) by mouth daily with breakfast.   apixaban (ELIQUIS) 5 MG TABS tablet Take 1 tablet (5 mg total) by mouth 2 (two) times daily.   B Complex Vitamins (VITAMIN B COMPLEX) TABS Take 1 tablet by mouth daily.   carvedilol (COREG) 12.5 MG tablet Take 1 tablet (12.5 mg total) by mouth 2 (two) times daily.   cholecalciferol (VITAMIN D) 25 MCG (1000 UT) tablet Take 1,000 Units by mouth daily.   cloNIDine (CATAPRES) 0.1 MG tablet Take 1 tablet (0.1 mg total) by mouth 2 (two) times daily.   doxazosin (CARDURA) 8 MG tablet Take 0.5 tablets (4 mg total) by mouth at bedtime.   hydrochlorothiazide (HYDRODIURIL) 25 MG tablet Take 25 mg by mouth daily.   latanoprost (XALATAN) 0.005 % ophthalmic solution Place 1 drop into both eyes at bedtime.   LORazepam (ATIVAN) 0.5 MG tablet Take 0.5 mg by mouth daily as needed for sleep.   Misc Natural Products (PROSTATE HEALTH PO) Take 1 capsule by mouth daily. Super Beta Prostate   Multiple Vitamin (MULTIVITAMIN WITH MINERALS) TABS tablet Take 1 tablet by mouth daily.   MYRBETRIQ 50 MG TB24 tablet TAKE 1 TABLET(50 MG) BY MOUTH DAILY (Patient taking differently: Take 50 mg by mouth daily as needed (frequent urination (take when going out)).)   Polyethyl Glycol-Propyl Glycol (LUBRICANT EYE DROPS) 0.4-0.3 % SOLN Place 1 drop into both eyes 3 (three) times daily as needed (dry/irritated eyes.).   sacubitril-valsartan (ENTRESTO) 24-26 MG Take 1 tablet by mouth 2 (two) times daily.   saw palmetto 500 MG capsule Take 500 mg by mouth every evening.   solifenacin (VESICARE) 5 MG tablet TAKE 1 TABLET(5 MG) BY MOUTH DAILY (Patient taking differently: Take 5 mg by mouth daily as needed (frequent urination (take when going out)).)   spironolactone (ALDACTONE) 25 MG tablet Take 0.5 tablets (12.5 mg total) by mouth daily.   Turmeric 500 MG CAPS Take 500 mg by  mouth daily.   [DISCONTINUED] carvedilol (COREG) 6.25 MG tablet Take 1 tablet (6.25 mg) by mouth 2 (two) times daily with a meal.     Allergies:   Patient has no known allergies.   Social History   Socioeconomic History   Marital status: Married    Spouse name: Not on file   Number of children: 5   Years of education: Not on file   Highest education level: Not on file  Occupational History   Not on file  Tobacco Use   Smoking status: Former    Packs/day: 0.00    Years: 4.00    Total pack years: 0.00    Types: Cigarettes    Quit date: 2000    Years since quitting: 23.6   Smokeless tobacco: Never   Tobacco comments:    quit about 40 yrs ago  Vaping Use  Vaping Use: Never used  Substance and Sexual Activity   Alcohol use: Yes    Alcohol/week: 0.0 standard drinks of alcohol    Comment: 6 pack of beer or less in a week   Drug use: No   Sexual activity: Not on file  Other Topics Concern   Not on file  Social History Narrative   Not on file   Social Determinants of Health   Financial Resource Strain: Not on file  Food Insecurity: Not on file  Transportation Needs: Not on file  Physical Activity: Not on file  Stress: Not on file  Social Connections: Not on file     Family History: The patient's family history includes CVA in his mother; Cancer in his brother; Hypertension in his father. There is no history of Colon cancer.  ROS:   Please see the history of present illness.     EKGs/Labs/Other Studies Reviewed:    EKG:  The ekg ordered today demonstrates paced rhythm with heart rate 60 bpm.  Recent Labs: 01/23/2022: ALT 17; B Natriuretic Peptide 372.0 01/24/2022: Magnesium 2.0 02/03/2022: Hemoglobin 13.8; Platelets 206 02/11/2022: BUN 32; Creatinine, Ser 1.58; Potassium 4.4; Sodium 142   Recent Lipid Panel Lab Results  Component Value Date/Time   CHOL 165 08/15/2019 02:10 PM   TRIG 56 08/15/2019 02:10 PM   HDL 73 08/15/2019 02:10 PM   LDLCALC 81 08/15/2019  02:10 PM    Physical Exam:    VS:  BP (!) 150/70   Pulse 60   Ht '5\' 11"'$  (1.803 m)   Wt 208 lb 6.4 oz (94.5 kg)   SpO2 98%   BMI 29.07 kg/m    No data found.  GEN:  Well nourished, well developed in no acute distress HEENT: Normal NECK: No JVD; No carotid bruits CARDIAC: RRR, no murmurs, rubs, gallops RESPIRATORY:  Clear to auscultation without rales, wheezing or rhonchi  ABDOMEN: Soft, non-tender, non-distended MUSCULOSKELETAL: No edema; No deformity  SKIN: Warm and dry NEUROLOGIC:  Alert and oriented PSYCHIATRIC:  Normal affect    ASSESSMENT AND PLAN   Hypertension with labile BP -Patient's had many med changes in the past month.  Now blood pressure running on the higher side. -Recommend increasing carvedilol to 12.5 mg twice daily. -Check BMET today.  If creatinine returns to baseline, we could consider increasing his Entresto.   -Asked patient to call us with 2-week blood pressure log following increase carvedilol.  (Wife seems organized) -I told him and his wife my goal systolic blood pressure for him is 110s to 120s.    Nonischemic cardiomyopathy, euvolemic -EF 35 to 40% in July 2023 -Increase carvedilol.  Continue Entresto and spironolactone -- unable to titrate doses (or add SGLT2 inhibitor) secondary to recent AKI on CKD. -Order repeat 2D echo in 2 months post CRT implant/CHF med therapy.  Paroxysmal atrial fibrillation -EKG-today shows paced rhythm with heart rate 60 -Continue Eliquis for stroke prevention. -He has a follow-up with EP in November.  Celiac artery stenosis SMA stenosis  -Previously denied any abdominal discomfort with food  Disposition - Follow-up in 2 to 3 months with Dr. Claiborne Billings or myself.  Patient also has a follow-up with PCP within the next month who can also help manage blood pressure.   Medication Adjustments/Labs and Tests Ordered: Current medicines are reviewed at length with the patient today.  Concerns regarding medicines are  outlined above.  Orders Placed This Encounter  Procedures   Basic metabolic panel   EKG 10-UVOZ  ECHOCARDIOGRAM COMPLETE   Meds ordered this encounter  Medications   carvedilol (COREG) 12.5 MG tablet    Sig: Take 1 tablet (12.5 mg total) by mouth 2 (two) times daily.    Dispense:  180 tablet    Refill:  3    Patient Instructions  Medication Instructions:  Increase Coreg to 12.5 mg ( Take 1 Tablet Twice Daily). *If you need a refill on your cardiac medications before your next appointment, please call your pharmacy*   Lab Work: BMET Today. If you have labs (blood work) drawn today and your tests are completely normal, you will receive your results only by: [place] (if you have MyChart) OR A paper copy in the mail If you have any lab test that is abnormal or we need to change your treatment, we will call you to review the results.   Testing/Procedures: [address] [place] has requested that you have an echocardiogram. Echocardiography is a painless test that uses sound waves to create images of your heart. It provides your doctor with information about the size and shape of your heart and how well your heart's chambers and valves are working. This procedure takes approximately one hour. There are no restrictions for this procedure.    Follow-Up: At [facility], you and your health needs are our priority.  As part of our continuing mission to provide you with exceptional heart care, we have created designated Provider Care Teams.  These Care Teams include your primary Cardiologist (physician) and Advanced Practice Providers (APPs -  Physician Assistants and Nurse Practitioners) who all work together to provide you with the care you need, when you need it.  We recommend signing up for the patient portal called "MyChart".  Sign up information is provided on this After Visit Summary.  MyChart is used to connect with patients for Virtual Visits  (Telemedicine).  Patients are able to view lab/test results, encounter notes, upcoming appointments, etc.  Non-urgent messages can be sent to your provider as well.   To learn more about what you can do with MyChart, go to [email or web address].    Your next appointment:   2-3 month(s)  The format for your next appointment:   In Person  Provider:   Caron Presume, PA-C   or, Shelva Majestic, MD .      Signed, Warren Lacy, PA-C  02/18/2022 1:46 PM    [facility]

## 2022-02-18 ENCOUNTER — Encounter: Payer: Self-pay | Admitting: Physician Assistant

## 2022-02-18 ENCOUNTER — Ambulatory Visit (INDEPENDENT_AMBULATORY_CARE_PROVIDER_SITE_OTHER): Payer: Medicare PPO | Admitting: Physician Assistant

## 2022-02-18 VITALS — BP 150/70 | HR 60 | Ht 71.0 in | Wt 208.4 lb

## 2022-02-18 DIAGNOSIS — I428 Other cardiomyopathies: Secondary | ICD-10-CM

## 2022-02-18 DIAGNOSIS — I480 Paroxysmal atrial fibrillation: Secondary | ICD-10-CM

## 2022-02-18 DIAGNOSIS — I10 Essential (primary) hypertension: Secondary | ICD-10-CM | POA: Diagnosis not present

## 2022-02-18 MED ORDER — CARVEDILOL 12.5 MG PO TABS: 12.5000 mg | ORAL_TABLET | Freq: Two times a day (BID) | ORAL | 3 refills | Status: DC

## 2022-02-18 NOTE — Patient Instructions (Signed)
Medication Instructions:  Increase Coreg to 12.5 mg ( Take 1 Tablet Twice Daily). *If you need a refill on your cardiac medications before your next appointment, please call your pharmacy*   Lab Work: BMET Today. If you have labs (blood work) drawn today and your tests are completely normal, you will receive your results only by: [place] (if you have MyChart) OR A paper copy in the mail If you have any lab test that is abnormal or we need to change your treatment, we will call you to review the results.   Testing/Procedures: [address] [place] has requested that you have an echocardiogram. Echocardiography is a painless test that uses sound waves to create images of your heart. It provides your doctor with information about the size and shape of your heart and how well your heart's chambers and valves are working. This procedure takes approximately one hour. There are no restrictions for this procedure.    Follow-Up: At [facility], you and your health needs are our priority.  As part of our continuing mission to provide you with exceptional heart care, we have created designated Provider Care Teams.  These Care Teams include your primary Cardiologist (physician) and Advanced Practice Providers (APPs -  Physician Assistants and Nurse Practitioners) who all work together to provide you with the care you need, when you need it.  We recommend signing up for the patient portal called "MyChart".  Sign up information is provided on this After Visit Summary.  MyChart is used to connect with patients for Virtual Visits (Telemedicine).  Patients are able to view lab/test results, encounter notes, upcoming appointments, etc.  Non-urgent messages can be sent to your provider as well.   To learn more about what you can do with MyChart, go to [email or web address].    Your next appointment:   2-3 month(s)  The format for your next appointment:   In  Person  Provider:   Caron Presume, PA-C   or, Shelva Majestic, MD .

## 2022-02-19 LAB — BASIC METABOLIC PANEL
BUN/Creatinine Ratio: 18 (ref 10–24)
BUN: 24 mg/dL (ref 8–27)
CO2: 26 mmol/L (ref 20–29)
Calcium: 9.5 mg/dL (ref 8.6–10.2)
Chloride: 102 mmol/L (ref 96–106)
Creatinine, Ser: 1.35 mg/dL — ABNORMAL HIGH (ref 0.76–1.27)
Glucose: 101 mg/dL — ABNORMAL HIGH (ref 70–99)
Potassium: 5 mmol/L (ref 3.5–5.2)
Sodium: 139 mmol/L (ref 134–144)
eGFR: 55 mL/min/{1.73_m2} — ABNORMAL LOW (ref >59)

## 2022-02-24 ENCOUNTER — Telehealth: Payer: Self-pay

## 2022-02-24 NOTE — Telephone Encounter (Addendum)
Called patient regarding results. Patient had understanding of results.----- Message from Warren Lacy, PA-C sent at 02/19/2022  3:21 PM EDT ----- Good news creatinine back at baseline, currently 1.35.  Please call in your blood pressures in 2 weeks.  With your kidney function at baseline, we have more options to increase your medications if needed.

## 2022-03-02 ENCOUNTER — Other Ambulatory Visit: Payer: Self-pay

## 2022-03-02 ENCOUNTER — Telehealth: Payer: Self-pay | Admitting: Cardiovascular Disease

## 2022-03-02 MED ORDER — SACUBITRIL-VALSARTAN 24-26 MG PO TABS: 1.0000 | ORAL_TABLET | Freq: Two times a day (BID) | ORAL | 3 refills | Status: DC

## 2022-03-02 MED ORDER — SACUBITRIL-VALSARTAN 24-26 MG PO TABS: 1.0000 | ORAL_TABLET | Freq: Two times a day (BID) | ORAL | 1 refills | Status: DC

## 2022-03-02 NOTE — Telephone Encounter (Signed)
*  STAT* If patient is at the pharmacy, call can be transferred to refill team.   1. Which medications need to be refilled? (please list name of each medication and dose if known) sacubitril-valsartan (ENTRESTO) 24-26 MG  2. Which pharmacy/location (including street and city if local pharmacy) is medication to be sent to? [id number] - [place], [place] [address]  3. Do they need a 30 day or 90 day supply? [place]

## 2022-03-02 NOTE — Telephone Encounter (Signed)
RX sent to pharmacy  

## 2022-03-06 ENCOUNTER — Ambulatory Visit (HOSPITAL_COMMUNITY): Payer: Medicare PPO | Attending: Cardiology

## 2022-03-06 DIAGNOSIS — I10 Essential (primary) hypertension: Secondary | ICD-10-CM | POA: Diagnosis present

## 2022-03-06 DIAGNOSIS — I480 Paroxysmal atrial fibrillation: Secondary | ICD-10-CM | POA: Diagnosis present

## 2022-03-06 DIAGNOSIS — I428 Other cardiomyopathies: Secondary | ICD-10-CM | POA: Insufficient documentation

## 2022-03-06 LAB — ECHOCARDIOGRAM COMPLETE
AR max vel: 2.91 cm2
AV Area VTI: 2.98 cm2
AV Area mean vel: 2.65 cm2
AV Mean grad: 2 mmHg
AV Peak grad: 4.3 mmHg
Ao pk vel: 1.04 m/s
Area-P 1/2: 2.72 cm2
S' Lateral: 3.5 cm

## 2022-03-11 ENCOUNTER — Telehealth: Payer: Self-pay

## 2022-03-11 NOTE — Telephone Encounter (Addendum)
Called patient regarding results. Patient had understanding of results.----- Message from Warren Lacy, PA-C sent at 03/10/2022 11:36 AM EDT ----- Ejection fraction remains 35 to 40%. Goal is to continue maximizing heart failure therapy as tolerable. Carvedilol increased at last visit.  Please send in your blood pressure readings. With your kidney function at baseline, we have more options to maximize your heart failure management.. My goal systolic blood pressure for him is 110s to 120s.    He has room to titrate Entresto, spironolactone and add SGLT2 inhibitor.  But requires careful monitoring of his any function with baseline Cr 1.1-1.4.

## 2022-03-25 ENCOUNTER — Telehealth: Payer: Self-pay

## 2022-03-25 ENCOUNTER — Encounter: Payer: Self-pay | Admitting: Physician Assistant

## 2022-03-25 NOTE — Progress Notes (Signed)
Received patient's blood pressure log with readings from August 10 to August 24.  Blood pressure averaging systolics 158E to 825R over diastolics 49T to 55E..  Highest blood pressure 168/88.  Lowest blood pressure 125/70.  Heart rates range 53-82.  With reduced ejection fraction and creatinine returning to baseline, I recommend: - Increasing Entresto to 49 over 51 mg twice daily.  #180, 3 refills -Check BMET (blood work) in 1 week. -Send in BP readings in 3 weeks.  Clifton Custard, PA-C

## 2022-03-25 NOTE — Telephone Encounter (Addendum)
Called patients spouse. Advised spouse regarding medication change. Spouse Stated patient does not want to take his medication and requested a call from provider. Advised spouse will forward message to Caron Presume.----- Message from Warren Lacy, PA-C sent at 03/25/2022 11:00 AM EDT ----- With reduced ejection fraction and creatinine returning to baseline, I recommend: - Increasing Entresto to 49 over 51 mg twice daily.  #180, 3 refills -Check BMET (blood work) in 1 week. -Send in BP readings in 3 weeks.

## 2022-04-03 ENCOUNTER — Ambulatory Visit: Payer: Medicare PPO | Admitting: Urology

## 2022-04-03 ENCOUNTER — Telehealth: Payer: Self-pay

## 2022-04-03 DIAGNOSIS — I5043 Acute on chronic combined systolic (congestive) and diastolic (congestive) heart failure: Secondary | ICD-10-CM

## 2022-04-03 MED ORDER — ENTRESTO 49-51 MG PO TABS: 1.0000 | ORAL_TABLET | Freq: Two times a day (BID) | ORAL | 3 refills | Status: DC

## 2022-04-03 NOTE — Telephone Encounter (Addendum)
Called patient to advise increasing Entresto to 49/51 mg  1 Tablet Twice Daily . Patient advised to have BMET in 1 week. And Monitor Blood Pressure Daily.----- Message from Warren Lacy, PA-C sent at 03/25/2022 11:00 AM EDT ----- With reduced ejection fraction and creatinine returning to baseline, I recommend: - Increasing Entresto to 49 over 51 mg twice daily.  #180, 3 refills -Check BMET (blood work) in 1 week. -Send in BP readings in 3 weeks.

## 2022-04-06 ENCOUNTER — Encounter: Payer: Self-pay | Admitting: Urology

## 2022-04-06 ENCOUNTER — Ambulatory Visit (INDEPENDENT_AMBULATORY_CARE_PROVIDER_SITE_OTHER): Payer: Medicare PPO | Admitting: Urology

## 2022-04-06 VITALS — BP 159/75 | HR 60

## 2022-04-06 DIAGNOSIS — N3281 Overactive bladder: Secondary | ICD-10-CM

## 2022-04-06 DIAGNOSIS — N401 Enlarged prostate with lower urinary tract symptoms: Secondary | ICD-10-CM

## 2022-04-06 DIAGNOSIS — N138 Other obstructive and reflux uropathy: Secondary | ICD-10-CM

## 2022-04-06 DIAGNOSIS — R351 Nocturia: Secondary | ICD-10-CM

## 2022-04-06 LAB — URINALYSIS, ROUTINE W REFLEX MICROSCOPIC
Bilirubin, UA: NEGATIVE
Glucose, UA: NEGATIVE
Ketones, UA: NEGATIVE
Leukocytes,UA: NEGATIVE
Nitrite, UA: NEGATIVE
Protein,UA: NEGATIVE
RBC, UA: NEGATIVE
Specific Gravity, UA: 1.015 (ref 1.005–1.030)
Urobilinogen, Ur: 0.2 mg/dL (ref 0.2–1.0)
pH, UA: 5 (ref 5.0–7.5)

## 2022-04-06 MED ORDER — ALFUZOSIN HCL ER 10 MG PO TB24: 10.0000 mg | ORAL_TABLET | Freq: Every day | ORAL | 3 refills | Status: DC

## 2022-04-06 MED ORDER — SOLIFENACIN SUCCINATE 5 MG PO TABS: 5.0000 mg | ORAL_TABLET | Freq: Every day | ORAL | 3 refills | Status: DC

## 2022-04-06 MED ORDER — MIRABEGRON ER 50 MG PO TB24
50.0000 mg | ORAL_TABLET | Freq: Every day | ORAL | 3 refills | Status: DC
Start: 2022-04-06 — End: 2022-06-30

## 2022-04-06 NOTE — Progress Notes (Signed)
04/06/2022 10:16 AM   Joseph [name] 1946-03-09 [id number]  Referring provider: Lemmie Evens, MD [place],  [place] [address]  No chief complaint on file.   HPI: Joseph [name] is a 76yo here for followup for OAB, BPH and nocturia. IPSS 8 QOl 2 on uroxatral, vesicare and mirabegron. Nocturia 2x.    PMH: Past Medical History:  Diagnosis Date   1st degree AV block    Atrial fibrillation (HCC)    Diastolic dysfunction 7/90   grade 1   Hypertension    New onset atrial fibrillation ([place]) 06/2015   RBBB    Sinus bradycardia    Stroke ([place])    no defecits, pt denies having a stroke, says [name] diagnosed this    Surgical History: Past Surgical History:  Procedure Laterality Date   BIV PACEMAKER INSERTION CRT-P N/A 01/26/2022   Procedure: BIV PACEMAKER INSERTION CRT-P;  Surgeon: Evans Lance, MD;  Location: [facility];  Service: Cardiovascular;  Laterality: N/A;   COLONOSCOPY     COLONOSCOPY  12/30/2011   Procedure: COLONOSCOPY;  Surgeon: [name], MD;  [facility];  Service: Endoscopy;  Laterality: N/A;  1:45PM   COLONOSCOPY N/A 04/19/2019   Procedure: COLONOSCOPY;  Surgeon: [name], MD;  [facility];  Service: Endoscopy;  Laterality: N/A;  2:00pm   EXCISION MASS LOWER EXTREMETIES Left 05/12/2018   Procedure: Left foot plantar fibroma excision;  Surgeon: [name], MD;  Location: [facility];  Service: Orthopedics;  Laterality: Left;   FOOT SURGERY Left    LEFT HEART CATH AND CORONARY ANGIOGRAPHY N/A 01/26/2022   Procedure: LEFT HEART CATH AND CORONARY ANGIOGRAPHY;  Surgeon: Troy Sine, MD;  Location: [facility];  Service: Cardiovascular;  Laterality: N/A;   SPERMATOCELECTOMY Right 11/13/2019   Procedure: EXCISION OF SPERMATIC CORD CYST;  Surgeon: [name], MD;  [facility];  Service: Urology;  Laterality: Right;   VASECTOMY  11/13/2019   Procedure: VASECTOMY;  Surgeon:  [name], MD;  [facility]: [facility];  Service: Urology;;    Home Medications:  Allergies as of 04/06/2022   No Known Allergies      Medication List        Accurate as of April 06, 2022 10:16 AM. If you have any questions, ask your nurse or doctor.          acetaminophen 325 MG tablet Commonly known as: TYLENOL Take 2 tablets (650 mg total) by mouth every 6 (six) hours as needed for mild pain, fever or headache (or Fever >/= 101).   alfuzosin 10 MG 24 hr tablet Commonly known as: UROXATRAL Take 1 tablet (10 mg total) by mouth daily with breakfast.   apixaban 5 MG Tabs tablet Commonly known as: Eliquis Take 1 tablet (5 mg total) by mouth 2 (two) times daily.   carvedilol 12.5 MG tablet Commonly known as: COREG Take 1 tablet (12.5 mg total) by mouth 2 (two) times daily.   cholecalciferol 25 MCG (1000 UNIT) tablet Commonly known as: VITAMIN D3 Take 1,000 Units by mouth daily.   cloNIDine 0.1 MG tablet Commonly known as: Catapres Take 1 tablet (0.1 mg total) by mouth 2 (two) times daily.   doxazosin 8 MG tablet Commonly known as: CARDURA Take 0.5 tablets (4 mg total) by mouth at bedtime.   Entresto 49-51 MG Generic drug: sacubitril-valsartan Take 1 tablet by mouth 2 (two) times daily.  hydrochlorothiazide 25 MG tablet Commonly known as: HYDRODIURIL Take 25 mg by mouth daily.   latanoprost 0.005 % ophthalmic solution Commonly known as: XALATAN Place 1 drop into both eyes at bedtime.   LORazepam 0.5 MG tablet Commonly known as: ATIVAN Take 0.5 mg by mouth daily as needed for sleep.   Lubricant Eye Drops 0.4-0.3 % Soln Generic drug: Polyethyl Glycol-Propyl Glycol Place 1 drop into both eyes 3 (three) times daily as needed (dry/irritated eyes.).   multivitamin with minerals Tabs tablet Take 1 tablet by mouth daily.   Myrbetriq 50 MG Tb24 tablet Generic drug: mirabegron ER TAKE 1 TABLET(50 MG) BY MOUTH DAILY What changed: See the new  instructions.   PROSTATE HEALTH PO Take 1 capsule by mouth daily. Super Beta Prostate   saw palmetto 500 MG capsule Take 500 mg by mouth every evening.   solifenacin 5 MG tablet Commonly known as: VESICARE TAKE 1 TABLET(5 MG) BY MOUTH DAILY What changed:  how much to take how to take this when to take this reasons to take this additional instructions   spironolactone 25 MG tablet Commonly known as: ALDACTONE Take 0.5 tablets (12.5 mg total) by mouth daily.   Turmeric 500 MG Caps Take 500 mg by mouth daily.   Vitamin B Complex Tabs Take 1 tablet by mouth daily.        Allergies: No Known Allergies  Family History: Family History  Problem Relation Age of Onset   Cancer Brother    CVA Mother    Hypertension Father    Colon cancer Neg Hx     Social History:  reports that he quit smoking about 23 years ago. His smoking use included cigarettes. He has never used smokeless tobacco. He reports current alcohol use. He reports that he does not use drugs.  ROS: All other review of systems were reviewed and are negative except what is noted above in HPI  Physical Exam: BP (!) 159/75   Pulse 60   Constitutional:  Alert and oriented, No acute distress. HEENT: [place] AT, moist mucus membranes.  Trachea midline, no masses. Cardiovascular: No clubbing, cyanosis, or edema. Respiratory: Normal respiratory effort, no increased work of breathing. GI: Abdomen is soft, nontender, nondistended, no abdominal masses GU: No CVA tenderness.  Lymph: No cervical or inguinal lymphadenopathy. Skin: No rashes, bruises or suspicious lesions. Neurologic: Grossly intact, no focal deficits, moving all 4 extremities. Psychiatric: Normal mood and affect.  Laboratory Data: Lab Results  Component Value Date   WBC 3.8 (L) 02/03/2022   HGB 13.8 02/03/2022   HCT 43.9 02/03/2022   MCV 69.0 (L) 02/03/2022   PLT 206 02/03/2022    Lab Results  Component Value Date   CREATININE 1.35 (H)  02/18/2022    No results found for: "PSA"  No results found for: "TESTOSTERONE"  No results found for: "HGBA1C"  Urinalysis    Component Value Date/Time   COLORURINE AMBER (A) 02/03/2022 1531   APPEARANCEUR HAZY (A) 02/03/2022 1531   APPEARANCEUR Clear 09/23/2021 1211   LABSPEC 1.019 02/03/2022 1531   PHURINE 5.0 02/03/2022 1531   GLUCOSEU NEGATIVE 02/03/2022 1531   HGBUR NEGATIVE 02/03/2022 1531   BILIRUBINUR NEGATIVE 02/03/2022 1531   BILIRUBINUR Negative 09/23/2021 1211   KETONESUR NEGATIVE 02/03/2022 1531   PROTEINUR NEGATIVE 02/03/2022 1531   NITRITE NEGATIVE 02/03/2022 1531   LEUKOCYTESUR NEGATIVE 02/03/2022 1531    Lab Results  Component Value Date   LABMICR See below: 09/23/2021   WBCUA None seen 09/23/2021  LABEPIT None seen 09/23/2021   BACTERIA None seen 09/23/2021    Pertinent Imaging: *** No results found for this or any previous visit.  No results found for this or any previous visit.  No results found for this or any previous visit.  No results found for this or any previous visit.  No results found for this or any previous visit.  No valid procedures specified. No results found for this or any previous visit.  No results found for this or any previous visit.   Assessment & Plan:    1. Benign prostatic hyperplasia with urinary obstruction *** - Urinalysis, Routine w reflex microscopic  2. OAB (overactive bladder) Continue vesicare '5mg'$  and mirabegron '50mg'$    3. Nocturia Continue uroxatral   No follow-ups on file.  Nicolette Bang, MD  [facility] [place]

## 2022-04-06 NOTE — Patient Instructions (Signed)

## 2022-04-28 ENCOUNTER — Ambulatory Visit (INDEPENDENT_AMBULATORY_CARE_PROVIDER_SITE_OTHER): Payer: Medicare PPO

## 2022-04-28 DIAGNOSIS — I428 Other cardiomyopathies: Secondary | ICD-10-CM | POA: Diagnosis not present

## 2022-04-28 LAB — CUP PACEART REMOTE DEVICE CHECK
Battery Remaining Longevity: 40 mo
Battery Remaining Percentage: 95.5 %
Battery Voltage: 2.98 V
Brady Statistic AP VP Percent: 58 %
Brady Statistic AP VS Percent: 3.3 %
Brady Statistic AS VP Percent: 33 %
Brady Statistic AS VS Percent: 3.1 %
Brady Statistic RA Percent Paced: 57 %
Date Time Interrogation Session: 20231017033816
Implantable Lead Implant Date: 20230717
Implantable Lead Implant Date: 20230717
Implantable Lead Implant Date: 20230717
Implantable Lead Location: 753858
Implantable Lead Location: 753859
Implantable Lead Location: 753860
Implantable Pulse Generator Implant Date: 20230717
Lead Channel Impedance Value: 360 Ohm
Lead Channel Impedance Value: 450 Ohm
Lead Channel Impedance Value: 510 Ohm
Lead Channel Pacing Threshold Amplitude: 0.75 V
Lead Channel Pacing Threshold Amplitude: 0.75 V
Lead Channel Pacing Threshold Amplitude: 1.25 V
Lead Channel Pacing Threshold Pulse Width: 0.5 ms
Lead Channel Pacing Threshold Pulse Width: 0.5 ms
Lead Channel Pacing Threshold Pulse Width: 0.5 ms
Lead Channel Sensing Intrinsic Amplitude: 3.9 mV
Lead Channel Sensing Intrinsic Amplitude: 9.1 mV
Lead Channel Setting Pacing Amplitude: 3.5 V
Lead Channel Setting Pacing Amplitude: 3.5 V
Lead Channel Setting Pacing Amplitude: 3.5 V
Lead Channel Setting Pacing Pulse Width: 0.5 ms
Lead Channel Setting Pacing Pulse Width: 0.5 ms
Lead Channel Setting Sensing Sensitivity: 2 mV
Pulse Gen Model: 3562
Pulse Gen Serial Number: 8094736

## 2022-05-07 ENCOUNTER — Encounter: Payer: Medicare PPO | Admitting: Internal Medicine

## 2022-05-11 ENCOUNTER — Encounter: Payer: Medicare PPO | Admitting: Internal Medicine

## 2022-05-12 NOTE — Progress Notes (Signed)
Remote pacemaker transmission.   

## 2022-05-14 ENCOUNTER — Ambulatory Visit: Payer: Medicare PPO | Attending: Internal Medicine | Admitting: Internal Medicine

## 2022-05-14 ENCOUNTER — Encounter: Payer: Self-pay | Admitting: Internal Medicine

## 2022-05-14 VITALS — BP 138/76 | HR 60 | Ht 71.0 in | Wt 213.2 lb

## 2022-05-14 DIAGNOSIS — I5022 Chronic systolic (congestive) heart failure: Secondary | ICD-10-CM | POA: Diagnosis not present

## 2022-05-14 NOTE — Progress Notes (Signed)
HPI  Joseph [name] is a 76 y.o. male with a hx of hypertension, PAF on Eliquis, right bundle branch block, history of stroke, NSVT, celiac artery stenosis, SMA stenosis, tachybradycardia syndrome requiring pacemaker, and nonischemic cardiomyopathy . In the interim, he notes that he has done well. He has had some residual tenderness around his device. No fever or chills or swelling.    Past Medical History:  Diagnosis Date   1st degree AV block    Atrial fibrillation (HCC)    Diastolic dysfunction 3/22   grade 1   Hypertension    New onset atrial fibrillation ([place]) 06/2015   RBBB    Sinus bradycardia    Stroke ([place])    no defecits, pt denies having a stroke, says [name] diagnosed this    ROS:   All systems reviewed and negative except as noted in the HPI.   Past Surgical History:  Procedure Laterality Date   BIV PACEMAKER INSERTION CRT-P N/A 01/26/2022   Procedure: BIV PACEMAKER INSERTION CRT-P;  Surgeon: Evans Lance, MD;  Location: [facility];  Service: Cardiovascular;  Laterality: N/A;   COLONOSCOPY     COLONOSCOPY  12/30/2011   Procedure: COLONOSCOPY;  Surgeon: [name], MD;  [facility];  Service: Endoscopy;  Laterality: N/A;  1:45PM   COLONOSCOPY N/A 04/19/2019   Procedure: COLONOSCOPY;  Surgeon: [name], MD;  [facility];  Service: Endoscopy;  Laterality: N/A;  2:00pm   EXCISION MASS LOWER EXTREMETIES Left 05/12/2018   Procedure: Left foot plantar fibroma excision;  Surgeon: [name], MD;  Location: [facility];  Service: Orthopedics;  Laterality: Left;   FOOT SURGERY Left    LEFT HEART CATH AND CORONARY ANGIOGRAPHY N/A 01/26/2022   Procedure: LEFT HEART CATH AND CORONARY ANGIOGRAPHY;  Surgeon: Troy Sine, MD;  Location: [facility];  Service: Cardiovascular;  Laterality: N/A;   SPERMATOCELECTOMY Right 11/13/2019   Procedure: EXCISION OF SPERMATIC CORD CYST;  Surgeon: [name], MD;  [facility];  Service: Urology;  Laterality: Right;   VASECTOMY  11/13/2019   Procedure: VASECTOMY;  Surgeon: [name], MD;  [facility];  Service: Urology;;     Family History  Problem Relation Age of Onset   Cancer Brother    CVA Mother    Hypertension Father    Colon cancer Neg Hx      Social History   Socioeconomic History   Marital status: Married    Spouse name: Not on file   Number of children: 5   Years of education: Not on file   Highest education level: Not on file  Occupational History   Not on file  Tobacco Use   Smoking status: Former    Packs/day: 0.00    Years: 4.00    Total pack years: 0.00    Types: Cigarettes    Quit date: 2000    Years since quitting: 23.8   Smokeless tobacco: Never   Tobacco comments:    quit about 40 yrs ago  Vaping Use   Vaping Use: Never used  Substance and Sexual Activity   Alcohol use: Yes    Alcohol/week: 0.0 standard drinks of alcohol    Comment: 6 pack of beer or less in a week   Drug use: No   Sexual activity: Not on file  Other Topics Concern   Not on file  Social History Narrative  Not on file   Social Determinants of Health   Financial Resource Strain: Not on file  Food Insecurity: Not on file  Transportation Needs: Not on file  Physical Activity: Not on file  Stress: Not on file  Social Connections: Not on file  Intimate Partner Violence: Not on file     BP 138/76   Pulse 60   Ht '5\' 11"'$  (1.803 m)   Wt 213 lb 3.2 oz (96.7 kg)   SpO2 97%   BMI 29.74 kg/m   Physical Exam:  Well appearing NAD HEENT: Unremarkable Neck:  No JVD, no thyromegally Lymphatics:  No adenopathy Back:  No CVA tenderness Lungs:  Clear with no wheezes HEART:  Regular rate rhythm, no murmurs, no rubs, no clicks Abd:  soft, positive bowel sounds, no organomegally, no rebound, no guarding Ext:  2 plus pulses, no edema, no cyanosis, no clubbing Skin:  No rashes no nodules Neuro:  CN II  through XII intact, motor grossly intact  DEVICE  Normal device function.  See PaceArt for details.   Assess/Plan: Chronic systolic heart failure -his symptoms are class 2. No change in medical therapy. Heart block/PPM - his [employer] biv PPM is working normally.  NSVT - no symptoms. We will follow.  Carleene Overlie [name],MD

## 2022-05-14 NOTE — Patient Instructions (Signed)
Medication Instructions:  Your physician recommends that you continue on your current medications as directed. Please refer to the Current Medication list given to you today.  *If you need a refill on your cardiac medications before your next appointment, please call your pharmacy*   Lab Work: NONE   If you have labs (blood work) drawn today and your tests are completely normal, you will receive your results only by: MyChart Message (if you have MyChart) OR A paper copy in the mail If you have any lab test that is abnormal or we need to change your treatment, we will call you to review the results.   Testing/Procedures: NONE    Follow-Up: At [facility], you and your health needs are our priority.  As part of our continuing mission to provide you with exceptional heart care, we have created designated Provider Care Teams.  These Care Teams include your primary Cardiologist (physician) and Advanced Practice Providers (APPs -  Physician Assistants and Nurse Practitioners) who all work together to provide you with the care you need, when you need it.  We recommend signing up for the patient portal called "MyChart".  Sign up information is provided on this After Visit Summary.  MyChart is used to connect with patients for Virtual Visits (Telemedicine).  Patients are able to view lab/test results, encounter notes, upcoming appointments, etc.  Non-urgent messages can be sent to your provider as well.   To learn more about what you can do with MyChart, go to [email or web address].    Your next appointment:   1 year(s)  The format for your next appointment:   In Person  Provider:   Gregg Taylor, MD    Other Instructions Thank you for choosing [facility]!    Important Information About Sugar       

## 2022-05-18 ENCOUNTER — Other Ambulatory Visit: Payer: Self-pay | Admitting: Cardiovascular Disease

## 2022-05-26 NOTE — Progress Notes (Deleted)
Cardiology Office Note:    Date:  05/27/2022   ID:  Joseph [name], DOB [birth year], MRN [id number]  PCP:  Joseph [name], [place] Cardiologist: Joseph Majestic, [name]   Reason for visit: 2-3 month follow-up  History of Present Illness:    Joseph [name] is a 76 y.o. male with a hx of hypertension, PAF on Eliquis, right bundle branch block, history of stroke, NSVT, celiac artery stenosis, SMA stenosis, tachybradycardia syndrome requiring pacemaker, and nonischemic cardiomyopathy.  He last saw Joseph [name] in February 2023.  Patient admitted to not taking his blood pressure medication consistently and had occasional blood pressure spikes.  He was recommended to take amlodipine, hydrochlorothiazide and spironolactone during the day and take the doxazosin, irbesartan and minoxidil at night.   He was admitted to the hospital July 14 with symptomatic tachy-brady syndrome, feeling woozy.  He had atrial fibrillation with RVR with transition to sinus bradycardia/junctional rhythm. TTE showed new low EF of 35 to 40%.  Left heart cath showed no obstructive CAD.  He underwent CRT pacer by Joseph [name].  On discharge, he was started on Coreg and his irbesartan was switched to Joseph [name].   He was admitted to the hospital February 03, 2022 with hypotension & dizziness, BP 95/53 and at recheck at PCP 70/56.  It appears that patient was taking his old medications as well as new ones from 7/14 admit.  Patient had AKI with creatinine 1.8 with baseline 1.1-1.4.  Amlodipine discontinued.  Clonidine decreased to 0.1 mg twice daily.  Spironolactone decreased to 12.5 mg.  Doxazosin decreased from '8mg'$  to 4 mg.  He was given IVF.  Recheck Cr 1.58 on 8/2.     I saw him in August 2023.  He had no further lightheadedness or cardiac symptoms.  Blood pressure averaged systolic 884Z with occasional reading to the 160s 170s.  Coreg was increased.  Echo 8/25 showed EF 35-40%.  With continued BP 130s to 150s, increased his  Entresto to 49 over 51 mg.    Today, ***  Goal is to continue maximizing heart failure therapy as tolerable. Carvedilol increased at last visit.   Please send in your blood pressure readings. With your kidney function at baseline, we have more options to maximize your heart failure management.. My goal systolic blood pressure for him is 110s to 120s.     He has room to titrate Entresto, spironolactone and add SGLT2 inhibitor.  But requires careful monitoring of his any function with baseline Cr 1.2-1.4.    ?Add Jardiance/Farxiga Check BMET  Hypertension with labile BP -Goal systolic blood pressure for him is 110s to 120s.   -***   Nonischemic cardiomyopathy, euvolemic -EF 55-60% in Feb 2022 -EF 35 to 40% in July 2023 --- unchanged on echo Aug 2023  -***  -Increase carvedilol.  Continue Entresto and spironolactone -- unable to titrate doses (or add SGLT2 inhibitor) secondary to recent AKI on CKD. -May consider repeat 2D echo in 6 months after maximal tolerated med therapy.   Paroxysmal atrial fibrillation -Continue Eliquis for stroke prevention. -Follows with EP Dr. Cristopher [name].   Celiac artery stenosis SMA stenosis  -Previously denied any abdominal discomfort with food   Disposition - Follow-up in ***      Past Medical History:  Diagnosis Date   1st degree AV block    Atrial fibrillation (HCC)    Diastolic dysfunction 6/60   grade 1   Hypertension    New onset atrial fibrillation (HCC)  06/2015   RBBB    Sinus bradycardia    Stroke ([place])    no defecits, pt denies having a stroke, says [name] diagnosed this    Past Surgical History:  Procedure Laterality Date   BIV PACEMAKER INSERTION CRT-P N/A 01/26/2022   Procedure: BIV PACEMAKER INSERTION CRT-P;  Surgeon: Joseph [name];  Location: [facility];  Service: Cardiovascular;  Laterality: N/A;   COLONOSCOPY     COLONOSCOPY  12/30/2011   Procedure: COLONOSCOPY;  Surgeon: Joseph [name];  [facility];  Service: Endoscopy;  Laterality: N/A;  1:45PM   COLONOSCOPY N/A 04/19/2019   Procedure: COLONOSCOPY;  Surgeon: Joseph [name];  [facility];  Service: Endoscopy;  Laterality: N/A;  2:00pm   EXCISION MASS LOWER EXTREMETIES Left 05/12/2018   Procedure: Left foot plantar fibroma excision;  Surgeon: Joseph [name];  Location: [facility];  Service: Orthopedics;  Laterality: Left;   FOOT SURGERY Left    LEFT HEART CATH AND CORONARY ANGIOGRAPHY N/A 01/26/2022   Procedure: LEFT HEART CATH AND CORONARY ANGIOGRAPHY;  Surgeon: Joseph [name];  Location: [facility];  Service: Cardiovascular;  Laterality: N/A;   SPERMATOCELECTOMY Right 11/13/2019   Procedure: EXCISION OF SPERMATIC CORD CYST;  Surgeon: Joseph [name];  [facility];  Service: Urology;  Laterality: Right;   VASECTOMY  11/13/2019   Procedure: VASECTOMY;  Surgeon: Joseph [name];  Location[facility];  Service: Urology;;    Current Medications: No outpatient medications have been marked as taking for the 05/27/22 encounter (Appointment) with Joseph Lacy, PA-C.     Allergies:   Patient has no known allergies.   Social History   Socioeconomic History   Marital status: Married    Spouse name: Not on file   Number of children: 5   Years of education: Not on file   Highest education level: Not on file  Occupational History   Not on file  Tobacco Use   Smoking status: Former    Packs/day: 0.00    Years: 4.00    Total pack years: 0.00    Types: Cigarettes    Quit date: 2000    Years since quitting: 23.8   Smokeless tobacco: Never   Tobacco comments:    quit about 40 yrs ago  Vaping Use   Vaping Use: Never used  Substance and Sexual Activity   Alcohol use: Yes    Alcohol/week: 0.0 standard drinks of alcohol    Comment: 6 pack of beer or less in a week   Drug use: No   Sexual activity: Not on file  Other Topics Concern   Not on file  Social  History Narrative   Not on file   Social Determinants of Health   Financial Resource Strain: Not on file  Food Insecurity: Not on file  Transportation Needs: Not on file  Physical Activity: Not on file  Stress: Not on file  Social Connections: Not on file     Family History: The patient's family history includes CVA in his mother; Cancer in his brother; Hypertension in his father. There is no history of Colon cancer.  ROS:   Please see the history of present illness.     EKGs/Labs/Other Studies Reviewed:    EKG:  The ekg ordered today demonstrates ***  Recent Labs: 01/23/2022: ALT 17; B Natriuretic Peptide 372.0 01/24/2022: Magnesium 2.0 02/03/2022: Hemoglobin 13.8; Platelets 206 02/18/2022:  BUN 24; Creatinine, Ser 1.35; Potassium 5.0; Sodium 139   Recent Lipid Panel Lab Results  Component Value Date/Time   CHOL 165 08/15/2019 02:10 PM   TRIG 56 08/15/2019 02:10 PM   HDL 73 08/15/2019 02:10 PM   LDLCALC 81 08/15/2019 02:10 PM    Physical Exam:    VS:  There were no vitals taken for this visit.   No data found.  No BP recorded.  {Refresh Note OR Click here to enter BP  :1}***    Wt Readings from Last 3 Encounters:  05/14/22 213 lb 3.2 oz (96.7 kg)  02/18/22 208 lb 6.4 oz (94.5 kg)  02/04/22 207 lb 10.8 oz (94.2 kg)     GEN: *** Well nourished, well developed in no acute distress HEENT: Normal NECK: No JVD; No carotid bruits CARDIAC: ***RRR, no murmurs, rubs, gallops RESPIRATORY:  Clear to auscultation without rales, wheezing or rhonchi  ABDOMEN: Soft, non-tender, non-distended MUSCULOSKELETAL: No edema SKIN: Warm and dry NEUROLOGIC:  Alert and oriented PSYCHIATRIC:  Normal affect     ASSESSMENT AND PLAN   ***   {Are you ordering a CV Procedure (e.g. stress test, cath, DCCV, TEE, etc)?   Press F2        :[id number]}    Medication Adjustments/Labs and Tests Ordered: Current medicines are reviewed at length with the patient today.  Concerns regarding  medicines are outlined above.  No orders of the defined types were placed in this encounter.  No orders of the defined types were placed in this encounter.   There are no Patient Instructions on file for this visit.   Signed, Joseph Lacy, PA-C  05/27/2022 10:16 AM    [place]

## 2022-05-27 ENCOUNTER — Ambulatory Visit: Payer: Medicare PPO | Attending: Physician Assistant | Admitting: Physician Assistant

## 2022-05-27 DIAGNOSIS — I428 Other cardiomyopathies: Secondary | ICD-10-CM

## 2022-05-27 DIAGNOSIS — I10 Essential (primary) hypertension: Secondary | ICD-10-CM

## 2022-05-27 DIAGNOSIS — I5022 Chronic systolic (congestive) heart failure: Secondary | ICD-10-CM

## 2022-06-18 ENCOUNTER — Encounter: Payer: Self-pay | Admitting: Physician Assistant

## 2022-06-29 ENCOUNTER — Other Ambulatory Visit: Payer: Self-pay | Admitting: Urology

## 2022-07-28 ENCOUNTER — Ambulatory Visit: Payer: Medicare PPO | Attending: Internal Medicine

## 2022-07-28 DIAGNOSIS — I495 Sick sinus syndrome: Secondary | ICD-10-CM

## 2022-07-28 LAB — CUP PACEART REMOTE DEVICE CHECK
Battery Remaining Longevity: 73 mo
Battery Remaining Percentage: 93 %
Battery Voltage: 2.99 V
Brady Statistic AP VP Percent: 60 %
Brady Statistic AP VS Percent: 3.1 %
Brady Statistic AS VP Percent: 32 %
Brady Statistic AS VS Percent: 2.3 %
Brady Statistic RA Percent Paced: 60 %
Date Time Interrogation Session: 20240116020009
Implantable Lead Connection Status: 753985
Implantable Lead Connection Status: 753985
Implantable Lead Connection Status: 753985
Implantable Lead Implant Date: 20230717
Implantable Lead Implant Date: 20230717
Implantable Lead Implant Date: 20230717
Implantable Lead Location: 753858
Implantable Lead Location: 753859
Implantable Lead Location: 753860
Implantable Pulse Generator Implant Date: 20230717
Lead Channel Impedance Value: 350 Ohm
Lead Channel Impedance Value: 450 Ohm
Lead Channel Impedance Value: 490 Ohm
Lead Channel Pacing Threshold Amplitude: 0.75 V
Lead Channel Pacing Threshold Amplitude: 0.75 V
Lead Channel Pacing Threshold Amplitude: 2 V
Lead Channel Pacing Threshold Pulse Width: 0.5 ms
Lead Channel Pacing Threshold Pulse Width: 0.5 ms
Lead Channel Pacing Threshold Pulse Width: 0.5 ms
Lead Channel Sensing Intrinsic Amplitude: 4.5 mV
Lead Channel Sensing Intrinsic Amplitude: 8.3 mV
Lead Channel Setting Pacing Amplitude: 2 V
Lead Channel Setting Pacing Amplitude: 2.5 V
Lead Channel Setting Pacing Amplitude: 2.5 V
Lead Channel Setting Pacing Pulse Width: 0.5 ms
Lead Channel Setting Pacing Pulse Width: 0.5 ms
Lead Channel Setting Sensing Sensitivity: 2 mV
Pulse Gen Model: 3562
Pulse Gen Serial Number: 8094736

## 2022-08-21 NOTE — Progress Notes (Signed)
Remote pacemaker transmission.   

## 2022-08-29 ENCOUNTER — Other Ambulatory Visit: Payer: Self-pay | Admitting: Cardiovascular Disease

## 2022-10-27 ENCOUNTER — Ambulatory Visit (INDEPENDENT_AMBULATORY_CARE_PROVIDER_SITE_OTHER): Payer: Medicare PPO

## 2022-10-27 DIAGNOSIS — I495 Sick sinus syndrome: Secondary | ICD-10-CM | POA: Diagnosis not present

## 2022-10-27 LAB — CUP PACEART REMOTE DEVICE CHECK
Battery Remaining Longevity: 71 mo
Battery Remaining Percentage: 89 %
Battery Voltage: 2.99 V
Brady Statistic AP VP Percent: 60 %
Brady Statistic AP VS Percent: 2.4 %
Brady Statistic AS VP Percent: 34 %
Brady Statistic AS VS Percent: 1.9 %
Brady Statistic RA Percent Paced: 59 %
Date Time Interrogation Session: 20240416030241
Implantable Lead Connection Status: 753985
Implantable Lead Connection Status: 753985
Implantable Lead Connection Status: 753985
Implantable Lead Implant Date: 20230717
Implantable Lead Implant Date: 20230717
Implantable Lead Implant Date: 20230717
Implantable Lead Location: 753858
Implantable Lead Location: 753859
Implantable Lead Location: 753860
Implantable Pulse Generator Implant Date: 20230717
Lead Channel Impedance Value: 390 Ohm
Lead Channel Impedance Value: 460 Ohm
Lead Channel Impedance Value: 490 Ohm
Lead Channel Pacing Threshold Amplitude: 0.75 V
Lead Channel Pacing Threshold Amplitude: 0.75 V
Lead Channel Pacing Threshold Amplitude: 2 V
Lead Channel Pacing Threshold Pulse Width: 0.5 ms
Lead Channel Pacing Threshold Pulse Width: 0.5 ms
Lead Channel Pacing Threshold Pulse Width: 0.5 ms
Lead Channel Sensing Intrinsic Amplitude: 12 mV
Lead Channel Sensing Intrinsic Amplitude: 3.5 mV
Lead Channel Setting Pacing Amplitude: 2 V
Lead Channel Setting Pacing Amplitude: 2.5 V
Lead Channel Setting Pacing Amplitude: 2.5 V
Lead Channel Setting Pacing Pulse Width: 0.5 ms
Lead Channel Setting Pacing Pulse Width: 0.5 ms
Lead Channel Setting Sensing Sensitivity: 2 mV
Pulse Gen Model: 3562
Pulse Gen Serial Number: 8094736

## 2022-11-30 NOTE — Progress Notes (Signed)
Remote pacemaker transmission.   

## 2023-01-26 ENCOUNTER — Ambulatory Visit (INDEPENDENT_AMBULATORY_CARE_PROVIDER_SITE_OTHER): Payer: Medicare PPO

## 2023-01-26 DIAGNOSIS — I495 Sick sinus syndrome: Secondary | ICD-10-CM

## 2023-01-27 LAB — CUP PACEART REMOTE DEVICE CHECK
Battery Remaining Longevity: 70 mo
Battery Remaining Percentage: 86 %
Battery Voltage: 2.99 V
Brady Statistic AP VP Percent: 60 %
Brady Statistic AP VS Percent: 2.1 %
Brady Statistic AS VP Percent: 35 %
Brady Statistic AS VS Percent: 1.6 %
Brady Statistic RA Percent Paced: 59 %
Date Time Interrogation Session: 20240716020012
Implantable Lead Connection Status: 753985
Implantable Lead Connection Status: 753985
Implantable Lead Connection Status: 753985
Implantable Lead Implant Date: 20230717
Implantable Lead Implant Date: 20230717
Implantable Lead Implant Date: 20230717
Implantable Lead Location: 753858
Implantable Lead Location: 753859
Implantable Lead Location: 753860
Implantable Pulse Generator Implant Date: 20230717
Lead Channel Impedance Value: 430 Ohm
Lead Channel Impedance Value: 490 Ohm
Lead Channel Impedance Value: 490 Ohm
Lead Channel Pacing Threshold Amplitude: 0.75 V
Lead Channel Pacing Threshold Amplitude: 0.75 V
Lead Channel Pacing Threshold Amplitude: 2 V
Lead Channel Pacing Threshold Pulse Width: 0.5 ms
Lead Channel Pacing Threshold Pulse Width: 0.5 ms
Lead Channel Pacing Threshold Pulse Width: 0.5 ms
Lead Channel Sensing Intrinsic Amplitude: 12 mV
Lead Channel Sensing Intrinsic Amplitude: 5 mV
Lead Channel Setting Pacing Amplitude: 2 V
Lead Channel Setting Pacing Amplitude: 2.5 V
Lead Channel Setting Pacing Amplitude: 2.5 V
Lead Channel Setting Pacing Pulse Width: 0.5 ms
Lead Channel Setting Pacing Pulse Width: 0.5 ms
Lead Channel Setting Sensing Sensitivity: 2 mV
Pulse Gen Model: 3562
Pulse Gen Serial Number: 8094736

## 2023-02-04 ENCOUNTER — Other Ambulatory Visit: Payer: Self-pay

## 2023-02-04 ENCOUNTER — Ambulatory Visit (HOSPITAL_COMMUNITY): Payer: Medicare PPO | Attending: Family Medicine | Admitting: Physical Therapy

## 2023-02-04 DIAGNOSIS — M5416 Radiculopathy, lumbar region: Secondary | ICD-10-CM | POA: Diagnosis present

## 2023-02-04 DIAGNOSIS — M6281 Muscle weakness (generalized): Secondary | ICD-10-CM | POA: Diagnosis present

## 2023-02-04 NOTE — Therapy (Signed)
OUTPATIENT PHYSICAL THERAPY THORACOLUMBAR EVALUATION   Patient Name: Joseph [name] MRN: [id number] DOB:05/19/1946, 77 y.o., male Today's Date: 02/04/2023  END OF SESSION:  PT End of Session - 02/04/23 1431     Visit Number 1    Number of Visits 12    Date for PT Re-Evaluation 03/18/23    Authorization Type humana    PT Start Time 1345    PT Stop Time 1430    PT Time Calculation (min) 45 min             Past Medical History:  Diagnosis Date   1st degree AV block    Atrial fibrillation (HCC)    Diastolic dysfunction 4/13   grade 1   Hypertension    New onset atrial fibrillation (HCC) 06/2015   RBBB    Sinus bradycardia    Stroke (HCC)    no defecits, pt denies having a stroke, says Dr Tresa Endo diagnosed this   Past Surgical History:  Procedure Laterality Date   BIV PACEMAKER INSERTION CRT-P N/A 01/26/2022   Procedure: BIV PACEMAKER INSERTION CRT-P;  Surgeon: Marinus Maw, MD;  Location: [facility] CV [facility];  Service: Cardiovascular;  Laterality: N/A;   COLONOSCOPY     COLONOSCOPY  12/30/2011   Procedure: COLONOSCOPY;  Surgeon: Corbin Ade, MD;  Location: [facility];  Service: Endoscopy;  Laterality: N/A;  1:45PM   COLONOSCOPY N/A 04/19/2019   Procedure: COLONOSCOPY;  Surgeon: Corbin Ade, MD;  Location: [facility];  Service: Endoscopy;  Laterality: N/A;  2:00pm   EXCISION MASS LOWER EXTREMETIES Left 05/12/2018   Procedure: Left foot plantar fibroma excision;  Surgeon: Toni Arthurs, MD;  Location: [facility];  Service: Orthopedics;  Laterality: Left;   FOOT SURGERY Left    LEFT HEART CATH AND CORONARY ANGIOGRAPHY N/A 01/26/2022   Procedure: LEFT HEART CATH AND CORONARY ANGIOGRAPHY;  Surgeon: Lennette Bihari, MD;  Location: [facility] CV [facility];  Service: Cardiovascular;  Laterality: N/A;   SPERMATOCELECTOMY Right 11/13/2019   Procedure: EXCISION OF SPERMATIC CORD CYST;  Surgeon: Malen Gauze, MD;  Location: [facility];  Service: Urology;   Laterality: Right;   VASECTOMY  11/13/2019   Procedure: VASECTOMY;  Surgeon: Malen Gauze, MD;  Location: [facility];  Service: Urology;;   Patient Active Problem List   Diagnosis Date Noted   AKI (acute kidney injury) (HCC) 02/03/2022   NICM (nonischemic cardiomyopathy) (HCC) 02/03/2022   Sinus node dysfunction (HCC)    Atrial flutter (HCC) 01/23/2022   OAB (overactive bladder) 12/27/2020   Nocturia 01/10/2020   Benign prostatic hyperplasia with urinary obstruction 01/10/2020   Non-recurrent unilateral inguinal hernia without obstruction or gangrene    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   History of adenomatous polyp of colon 02/27/2019   Paroxysmal atrial fibrillation (HCC) 07/18/2015   Bradycardia 06/29/2015   Near syncope    Atrial fibrillation with rapid ventricular response (HCC) 06/27/2015   Left ventricular diastolic dysfunction, NYHA class 3 10/26/2014   Kidney cysts 04/04/2013   HTN (hypertension) 03/31/2013    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 03/31/2013   Diastolic dysfunction- grade 2- echo 06/28/15 03/31/2013   RBBB 03/31/2013   AV block, 1st degree 03/31/2013   Sinus bradycardia 03/31/2013   Hematochezia 12/29/2011    PCP: Gareth Morgan  REFERRING PROVIDER: Gareth Morgan  REFERRING DIAG: Low back pain   Rationale for Evaluation and Treatment: Rehabilitation  THERAPY DIAG:  Lumbar radiculopathy Muscle weakness   ONSET DATE:  07/27/22                                                                                                                                                                                             SUBJECTIVE STATEMENT: Pt states that he started having Rt buttock pain over six months ago. ( Wife states that he fell 8 months ago landing on his buttock and has been complaining of his buttock ever since. ) Pt states that his pain is mostly in the morning and takes about two hours to work out.  Pt states that it affects his balance at times.    PERTINENT HISTORY:  CVA, AKI  PAIN:  Are you having pain? Yes: NPRS scale: 1/10; worst is an 8 Pain location: buttock  Pain description: aggravation  Aggravating factors: mornings  Relieving factors: walk around  PRECAUTIONS: None     WEIGHT BEARING RESTRICTIONS: No  FALLS:  Has patient fallen in last 6 months? No  LIVING ENVIRONMENT: Lives with: lives with their family Lives in: House/apartment Stairs: Yes: Internal: 12 steps; on right going up and External: 4 steps; on right going up; stairs does not bother the buttock pain  Has following equipment at home: None  OCCUPATION: retired  PLOF: Independent  PATIENT GOALS: less pain   OBJECTIVE:   PATIENT SURVEYS:  Difficult as pt has slight dementia, wife assists with questions.    COGNITION: Overall cognitive status: Within functional limits for tasks assessed       MUSCLE LENGTH: Hamstrings: Right 155 deg; Left 155 deg   POSTURE: No Significant postural limitations    LUMBAR ROM:   AROM eval  Flexion Fingers to toes   Extension 20  Right lateral flexion   Left lateral flexion   Right rotation   Left rotation    (Blank rows = not tested)  LOWER EXTREMITY ROM:   WFL    LOWER EXTREMITY MMT:    MMT Right eval Left eval  Hip flexion 4+ 5  Hip extension 3 3  Hip abduction 3+ 4+  Hip adduction    Hip internal rotation    Hip external rotation    Knee flexion 4- 5  Knee extension 5 5  Ankle dorsiflexion 5 5  Ankle plantarflexion    Ankle inversion    Ankle eversion     (Blank rows = not tested)  FUNCTIONAL TESTS:  30 seconds chair stand test:   6 , ( 8 is poor for pt age and sex, 13 is below average) 2 minute walk test:  339 ft pt begins to drag his Rt foot after a minute.  Single leg stance: RT: 6"  , LT 6"    TODAY'S TREATMENT:                                                                                                                              DATE:  02/04/24:   Evaluation   Bridge x 10 Knee to chest 3 x 30" Active hamstring stretch 3 x 30"  PATIENT EDUCATION:  Education details: HEP Person educated: Patient Education method: Explanation and Handouts Education comprehension: verbalized understanding  HOME EXERCISE PROGRAM: Access Code: [facility] URL: [email or web address] Date: 02/04/2023 Prepared by: Virgina Organ  Exercises - Supine Bridge  - 2 x daily - 7 x weekly - 1 sets - 10 reps - 5" hold - Supine Hamstring Stretch  - 2 x daily - 7 x weekly - 1 sets - 3 reps - 30" hold - Supine Single Knee to Chest Stretch  - 2 x daily - 7 x weekly - 1 sets - 3 reps - 30" hold  ASSESSMENT:  CLINICAL IMPRESSION: Patient is a 77 y.o. male who was seen today for physical therapy evaluation and treatment for Low back pain .   Evaluation demonstrates decreased ROM, decreased core/LE strength, decreased balance and increased pain.  Mr. Alipio will benefit from skilled PT to address these issues and decrease his pain while improving his functional ability.   OBJECTIVE IMPAIRMENTS: decreased activity tolerance, decreased balance, decreased ROM, decreased strength, and pain.   ACTIVITY LIMITATIONS: carrying, lifting, standing, and locomotion level  PARTICIPATION LIMITATIONS: shopping and community activity  PERSONAL FACTORS: Time since onset of injury/illness/exacerbation and 1-2 comorbidities: AKI, CVA  are also affecting patient's functional outcome.   REHAB POTENTIAL: Good  CLINICAL DECISION MAKING: Stable/uncomplicated  EVALUATION COMPLEXITY: Low   GOALS: Goals reviewed with patient? No  SHORT TERM GOALS: Target date: 02/25/23  PT to be I in Hep in order to decrease his Rt buttock pain to a 5/10 Baseline: Goal status: INITIAL  2.  PT to be able to single leg stance on each foot for 15" to reduce risk of falling  Baseline:  Goal status: INITIAL  3.  Pt core and LE strength to be increased to allow pt to be able to come  sit to stand 9 x in 30" Baseline:  Goal status: INITIAL  L  LONG TERM GOALS: Target date: 03/18/23  PT to be I in Hep in order to decrease his Rt buttock pain to a 2/10  Goal status: INITIAL   2.   PT to be able to single leg stance on each foot for 30" to reduce risk of falling  Baseline:  Goal status: INITIAL  3.   Pt core and LE strength to be increased to allow pt to be able to come sit to stand 11 x in 30" Baseline:  Goal status: INITIAL   PLAN:  PT FREQUENCY: 2x/week  PT DURATION: 6 weeks  PLANNED INTERVENTIONS: Therapeutic exercises, Neuromuscular re-education,  Balance training, Patient/Family education, Self Care, and Manual therapy.  PLAN FOR NEXT SESSION: Continue Core and LE strength, add piriformis stretches, progress balance   Virgina Organ, PT CLT [phone]  02/04/2023, 3:53 PM

## 2023-02-09 ENCOUNTER — Ambulatory Visit (HOSPITAL_COMMUNITY): Payer: Medicare PPO | Admitting: Physical Therapy

## 2023-02-09 DIAGNOSIS — M5416 Radiculopathy, lumbar region: Secondary | ICD-10-CM | POA: Diagnosis not present

## 2023-02-09 DIAGNOSIS — M6281 Muscle weakness (generalized): Secondary | ICD-10-CM

## 2023-02-09 NOTE — Therapy (Signed)
OUTPATIENT PHYSICAL THERAPY THORACOLUMBAR Treatment    Patient Name: Joseph [name] MRN: [id number] DOB:09-14-1945, 77 y.o., male Today's Date: 02/09/2023  END OF SESSION:  PT End of Session - 02/09/23 1430     Visit Number 2    Number of Visits 12    Date for PT Re-Evaluation 03/18/23    Authorization Type humana    Authorization Time Period 7/25-9/5 12 visits    Authorization - Number of Visits 2    Progress Note Due on Visit 12    PT Start Time 1350    PT Stop Time 1430    PT Time Calculation (min) 40 min          Past Medical History:  Diagnosis Date   1st degree AV block    Atrial fibrillation (HCC)    Diastolic dysfunction 4/13   grade 1   Hypertension    New onset atrial fibrillation (HCC) 06/2015   RBBB    Sinus bradycardia    Stroke (HCC)    no defecits, pt denies having a stroke, says Dr Tresa Endo diagnosed this   Past Surgical History:  Procedure Laterality Date   BIV PACEMAKER INSERTION CRT-P N/A 01/26/2022   Procedure: BIV PACEMAKER INSERTION CRT-P;  Surgeon: Marinus Maw, MD;  Location: [facility];  Service: Cardiovascular;  Laterality: N/A;   COLONOSCOPY     COLONOSCOPY  12/30/2011   Procedure: COLONOSCOPY;  Surgeon: Corbin Ade, MD;  Location: [facility];  Service: Endoscopy;  Laterality: N/A;  1:45PM   COLONOSCOPY N/A 04/19/2019   Procedure: COLONOSCOPY;  Surgeon: Corbin Ade, MD;  Location: [facility];  Service: Endoscopy;  Laterality: N/A;  2:00pm   EXCISION MASS LOWER EXTREMETIES Left 05/12/2018   Procedure: Left foot plantar fibroma excision;  Surgeon: Toni Arthurs, MD;  Location: [facility];  Service: Orthopedics;  Laterality: Left;   FOOT SURGERY Left    LEFT HEART CATH AND CORONARY ANGIOGRAPHY N/A 01/26/2022   Procedure: LEFT HEART CATH AND CORONARY ANGIOGRAPHY;  Surgeon: Lennette Bihari, MD;  Location: [facility];  Service: Cardiovascular;  Laterality: N/A;   SPERMATOCELECTOMY Right 11/13/2019    Procedure: EXCISION OF SPERMATIC CORD CYST;  Surgeon: Malen Gauze, MD;  Location: [facility];  Service: Urology;  Laterality: Right;   VASECTOMY  11/13/2019   Procedure: VASECTOMY;  Surgeon: Malen Gauze, MD;  Location: [facility];  Service: Urology;;   Patient Active Problem List   Diagnosis Date Noted   AKI (acute kidney injury) (HCC) 02/03/2022   NICM (nonischemic cardiomyopathy) (HCC) 02/03/2022   Sinus node dysfunction (HCC)    Atrial flutter (HCC) 01/23/2022   OAB (overactive bladder) 12/27/2020   Nocturia 01/10/2020   Benign prostatic hyperplasia with urinary obstruction 01/10/2020   Non-recurrent unilateral inguinal hernia without obstruction or gangrene    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   History of adenomatous polyp of colon 02/27/2019   Paroxysmal atrial fibrillation (HCC) 07/18/2015   Bradycardia 06/29/2015   Near syncope    Atrial fibrillation with rapid ventricular response (HCC) 06/27/2015   Left ventricular diastolic dysfunction, NYHA class 3 10/26/2014   Kidney cysts 04/04/2013   HTN (hypertension) 03/31/2013    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 03/31/2013   Diastolic dysfunction- grade 2- echo 06/28/15 03/31/2013   RBBB 03/31/2013   AV block, 1st degree 03/31/2013   Sinus bradycardia 03/31/2013   Hematochezia 12/29/2011    PCP: Gareth Morgan  REFERRING PROVIDER: Gareth Morgan  REFERRING DIAG: Low back pain   Rationale for Evaluation and Treatment: Rehabilitation  THERAPY DIAG:  Lumbar radiculopathy Muscle weakness   ONSET DATE: 07/27/22                                                                                                                                                                                             SUBJECTIVE STATEMENT: Pt states that he did not complete the exercises.   States that he forgot to do them and his wife can not remember where she put them.  Pt states that he started having Rt buttock pain over six months ago. ( Wife  states that he fell 8 months ago landing on his buttock and has been complaining of his buttock ever since. ) Pt states that his pain is mostly in the morning and takes about two hours to work out.  Pt states that it affects his balance at times.   PERTINENT HISTORY:  CVA, AKI  PAIN:  Are you having pain? Yes: NPRS scale: 0/10; worst is an 8 Pain location: buttock  Pain description: aggravation  Aggravating factors: mornings  Relieving factors: walk around  PRECAUTIONS: None     WEIGHT BEARING RESTRICTIONS: No  FALLS:  Has patient fallen in last 6 months? No  LIVING ENVIRONMENT: Lives with: lives with their family Lives in: House/apartment Stairs: Yes: Internal: 12 steps; on right going up and External: 4 steps; on right going up; stairs does not bother the buttock pain  Has following equipment at home: None  OCCUPATION: retired  PLOF: Independent  PATIENT GOALS: less pain   OBJECTIVE:   PATIENT SURVEYS:  Difficult as pt has slight dementia, wife assists with questions.    COGNITION: Overall cognitive status: Within functional limits for tasks assessed       MUSCLE LENGTH: Hamstrings: Right 155 deg; Left 155 deg   POSTURE: No Significant postural limitations    LUMBAR ROM:   AROM eval  Flexion Fingers to toes   Extension 20  Right lateral flexion   Left lateral flexion   Right rotation   Left rotation    (Blank rows = not tested)  LOWER EXTREMITY ROM:   WFL    LOWER EXTREMITY MMT:    MMT Right eval Left eval  Hip flexion 4+ 5  Hip extension 3 3  Hip abduction 3+ 4+  Hip adduction    Hip internal rotation    Hip external rotation    Knee flexion 4- 5  Knee extension 5 5  Ankle dorsiflexion 5 5  Ankle plantarflexion    Ankle inversion    Ankle  eversion     (Blank rows = not tested)  FUNCTIONAL TESTS:  30 seconds chair stand test:   6 , ( 8 is poor for pt age and sex, 89 is below average) 2 minute walk test:  339 ft pt begins to  drag his Rt foot after a minute.   Single leg stance: RT: 6"  , LT 6"    TODAY'S TREATMENT:                                                                                                                              DATE:  02/09/23 Supine: Bridge x 10 Trunk rotation x 10  Knee to chest x 3 for 30" Side lying : Hip abduction x 10 both rt and lt Prone: POE  Sitting: Sit to stand x 10  Ankle dorsi/plantar x 10  Nustep level 2 x 5 minutes    02/04/24:  Evaluation   Bridge x 10 Knee to chest 3 x 30" Active hamstring stretch 3 x 30"  PATIENT EDUCATION:  Education details: HEP Person educated: Patient Education method: Chief Technology Officer Education comprehension: verbalized understanding  HOME EXERCISE PROGRAM: Access Code: [facility] URL: [email or web address]- Supine Lower Trunk Rotation  - 2 x daily - 7 x weekly - 1 sets - 10 reps - 5" hold4  Date: 02/04/2023 Prepared by: Virgina Organ  Exercises - Supine Bridge  - 2 x daily - 7 x weekly - 1 sets - 10 reps - 5" hold - Supine Hamstring Stretch  - 2 x daily - 7 x weekly - 1 sets - 3 reps - 30" hold - Supine Single Knee to Chest Stretch  - 2 x daily - 7 x weekly - 1 sets - 3 reps - 30" hold  ASSESSMENT:  CLINICAL IMPRESSION: Evaluation and goals reviewed with pt today.  Therapist stressed that pt can not expect to get better if he does not do the exercises.  Therapist added trunk rotation to improve mobility.  Pt needs significant verbal and tactile cuing to perform exercises correctly. Therapist gave pt new copy of HEP.   EVAL:  Patient is a 77 y.o. male who was seen today for physical therapy evaluation and treatment for Low back pain .   Evaluation demonstrates decreased ROM, decreased core/LE strength, decreased balance and increased pain.  Mr. Rutigliano will benefit from skilled PT to address these issues and decrease his pain while improving his functional ability.   OBJECTIVE IMPAIRMENTS:  decreased activity tolerance, decreased balance, decreased ROM, decreased strength, and pain.   ACTIVITY LIMITATIONS: carrying, lifting, standing, and locomotion level  PARTICIPATION LIMITATIONS: shopping and community activity  PERSONAL FACTORS: Time since onset of injury/illness/exacerbation and 1-2 comorbidities: AKI, CVA  are also affecting patient's functional outcome.   REHAB POTENTIAL: Good  CLINICAL DECISION MAKING: Stable/uncomplicated  EVALUATION COMPLEXITY: Low   GOALS: Goals reviewed with patient? No  SHORT TERM GOALS: Target date: 02/25/23  PT to be  I in Hep in order to decrease his Rt buttock pain to a 5/10 Baseline: Goal status: On-going  2.  PT to be able to single leg stance on each foot for 15" to reduce risk of falling  Baseline:  Goal status:  On-going  3.  Pt core and LE strength to be increased to allow pt to be able to come sit to stand 9 x in 30" Baseline:  Goal status:  On-going    LONG TERM GOALS: Target date: 03/18/23  PT to be I in Hep in order to decrease his Rt buttock pain to a 2/10  Goal status:  On-going   2.   PT to be able to single leg stance on each foot for 30" to reduce risk of falling  Baseline:  Goal status:  On-going  3.   Pt core and LE strength to be increased to allow pt to be able to come sit to stand 11 x in 30" Baseline:  Goal status:  On-going   PLAN:  PT FREQUENCY: 2x/week  PT DURATION: 6 weeks  PLANNED INTERVENTIONS: Therapeutic exercises, Neuromuscular re-education, Balance training, Patient/Family education, Self Care, and Manual therapy.  PLAN FOR NEXT SESSION: Continue Core and LE strength, add piriformis stretches, progress balance   Virgina Organ, PT CLT [phone]  02/09/2023, 2:32 PM

## 2023-02-11 NOTE — Progress Notes (Signed)
Remote pacemaker transmission.   

## 2023-02-17 ENCOUNTER — Ambulatory Visit (HOSPITAL_COMMUNITY): Payer: Medicare PPO | Attending: Family Medicine | Admitting: Physical Therapy

## 2023-02-17 ENCOUNTER — Telehealth: Payer: Self-pay

## 2023-02-17 DIAGNOSIS — M6281 Muscle weakness (generalized): Secondary | ICD-10-CM | POA: Diagnosis present

## 2023-02-17 DIAGNOSIS — M5416 Radiculopathy, lumbar region: Secondary | ICD-10-CM | POA: Insufficient documentation

## 2023-02-17 NOTE — Telephone Encounter (Addendum)
Called patient regarding results. Patient had understanding of results.----- Message from Cannon Kettle sent at 02/16/2023  1:51 PM EDT ----- Ejection fraction stable at 35 to 40%.  No significant valve disease.  No signs of fluid retention. -Continue current therapy. -Ensure follow-up is scheduled with Dr. Tresa Endo or myself soon -- it's been 1 year since last general cardiology follow-up.

## 2023-02-17 NOTE — Therapy (Addendum)
OUTPATIENT PHYSICAL THERAPY THORACOLUMBAR Treatment    Patient Name: Joseph [name] MRN: [id number] DOB:1945/10/29, 77 y.o., male Today's Date: 02/17/2023  END OF SESSION:  PT End of Session - 02/17/2023 1546    Visit Number 3   Number of Visits 12    Date for PT Re-Evaluation 03/18/23    Authorization Type humana    Authorization Time Period 7/25-9/5 12 visits    Authorization - Number of Visits 3   Progress Note Due on Visit 12    PT Start Time 1514    PT Stop Time 1546   PT Time Calculation (min) 42 min          Past Medical History:  Diagnosis Date   1st degree AV block    Atrial fibrillation (HCC)    Diastolic dysfunction 4/13   grade 1   Hypertension    New onset atrial fibrillation (HCC) 06/2015   RBBB    Sinus bradycardia    Stroke (HCC)    no defecits, pt denies having a stroke, says Dr Tresa Endo diagnosed this   Past Surgical History:  Procedure Laterality Date   BIV PACEMAKER INSERTION CRT-P N/A 01/26/2022   Procedure: BIV PACEMAKER INSERTION CRT-P;  Surgeon: Marinus Maw, MD;  [facility]: [facility];  Service: Cardiovascular;  Laterality: N/A;   COLONOSCOPY     COLONOSCOPY  12/30/2011   Procedure: COLONOSCOPY;  Surgeon: Corbin Ade, MD;  Location: [facility];  Service: Endoscopy;  Laterality: N/A;  1:45PM   COLONOSCOPY N/A 04/19/2019   Procedure: COLONOSCOPY;  Surgeon: Corbin Ade, MD;  [facility]: [facility];  Service: Endoscopy;  Laterality: N/A;  2:00pm   EXCISION MASS LOWER EXTREMETIES Left 05/12/2018   Procedure: Left foot plantar fibroma excision;  Surgeon: Toni Arthurs, MD;  Location: [facility];  Service: Orthopedics;  Laterality: Left;   FOOT SURGERY Left    LEFT HEART CATH AND CORONARY ANGIOGRAPHY N/A 01/26/2022   Procedure: LEFT HEART CATH AND CORONARY ANGIOGRAPHY;  Surgeon: Lennette Bihari, MD;  [facility]: [facility];  Service: Cardiovascular;  Laterality: N/A;   SPERMATOCELECTOMY Right 11/13/2019   Procedure:  EXCISION OF SPERMATIC CORD CYST;  Surgeon: Malen Gauze, MD;  Location: [facility];  Service: Urology;  Laterality: Right;   VASECTOMY  11/13/2019   Procedure: VASECTOMY;  Surgeon: Malen Gauze, MD;  Location: [facility];  Service: Urology;;   Patient Active Problem List   Diagnosis Date Noted   AKI (acute kidney injury) (HCC) 02/03/2022   NICM (nonischemic cardiomyopathy) (HCC) 02/03/2022   Sinus node dysfunction (HCC)    Atrial flutter (HCC) 01/23/2022   OAB (overactive bladder) 12/27/2020   Nocturia 01/10/2020   Benign prostatic hyperplasia with urinary obstruction 01/10/2020   Non-recurrent unilateral inguinal hernia without obstruction or gangrene    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   History of adenomatous polyp of colon 02/27/2019   Paroxysmal atrial fibrillation (HCC) 07/18/2015   Bradycardia 06/29/2015   Near syncope    Atrial fibrillation with rapid ventricular response (HCC) 06/27/2015   Left ventricular diastolic dysfunction, NYHA class 3 10/26/2014   Kidney cysts 04/04/2013   HTN (hypertension) 03/31/2013    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 03/31/2013   Diastolic dysfunction- grade 2- echo 06/28/15 03/31/2013   RBBB 03/31/2013   AV block, 1st degree 03/31/2013   Sinus bradycardia 03/31/2013   Hematochezia 12/29/2011    PCP: Gareth Morgan  REFERRING PROVIDER: Gareth Morgan  REFERRING DIAG: Low back  pain   Rationale for Evaluation and Treatment: Rehabilitation  THERAPY DIAG:  Lumbar radiculopathy Muscle weakness   ONSET DATE: 07/27/22                                                                                                                                                                                             SUBJECTIVE STATEMENT:  Pt states that he feels no better his pain is in his buttock.  He lost his exercises again and therefore has not completed any of the exercises at home.  PERTINENT HISTORY:  CVA, AKI  PAIN:  Are you having pain? Yes: NPRS  scale: 0/10; worst is an 8 Pain location: buttock  Pain description: aggravation  Aggravating factors: mornings  Relieving factors: walk around  PRECAUTIONS: None     WEIGHT BEARING RESTRICTIONS: No  FALLS:  Has patient fallen in last 6 months? No  LIVING ENVIRONMENT: Lives with: lives with their family Lives in: House/apartment Stairs: Yes: Internal: 12 steps; on right going up and External: 4 steps; on right going up; stairs does not bother the buttock pain  Has following equipment at home: None  OCCUPATION: retired  PLOF: Independent  PATIENT GOALS: less pain   OBJECTIVE:   PATIENT SURVEYS:  Difficult as pt has slight dementia, wife assists with questions.    COGNITION: Overall cognitive status: Within functional limits for tasks assessed       MUSCLE LENGTH: Hamstrings: Right 155 deg; Left 155 deg   POSTURE: No Significant postural limitations    LUMBAR ROM:   AROM eval  Flexion Fingers to toes   Extension 20  Right lateral flexion   Left lateral flexion   Right rotation   Left rotation    (Blank rows = not tested)  LOWER EXTREMITY ROM:   WFL    LOWER EXTREMITY MMT:    MMT Right eval Left eval  Hip flexion 4+ 5  Hip extension 3 3  Hip abduction 3+ 4+  Hip adduction    Hip internal rotation    Hip external rotation    Knee flexion 4- 5  Knee extension 5 5  Ankle dorsiflexion 5 5  Ankle plantarflexion    Ankle inversion    Ankle eversion     (Blank rows = not tested)  FUNCTIONAL TESTS:  30 seconds chair stand test:   6 , ( 8 is poor for pt age and sex, 16 is below average) 2 minute walk test:  339 ft pt begins to drag his Rt foot after a minute.   Single leg stance: RT: 6"  , LT 6"  TODAY'S TREATMENT:                                                                                                                              DATE:  02/17/23 Sitting : Piriformis stretch 3 x 30"  Sit to stand x 15  Supine: Knee to chest 3 x  30"  Trunk rotation  Hamstring stretch 3 x 30"  Bridge x 15  Prone : POE Press up x 5 Single leg raise x 10 Standing: Heel raise x 10  Squat x 10   02/09/23 Supine: Bridge x 10 Trunk rotation x 10  Knee to chest x 3 for 30" Side lying : Hip abduction x 10 both rt and lt Prone: POE  Sitting: Sit to stand x 10  Ankle dorsi/plantar x 10  Nustep level 2 x 5 minutes    02/04/24:  Evaluation   Bridge x 10 Knee to chest 3 x 30" Active hamstring stretch 3 x 30"  PATIENT EDUCATION:  Education details: HEP Person educated: Patient Education method: Chief Technology [employer] Education comprehension: verbalized understanding  HOME EXERCISE PROGRAM: Access Code: [facility] URL: [email or web address]  02/17/23- sitting piriformis stretch x 30"  3x each  7/30/2- Supine Lower Trunk Rotation  - 2 x daily - 7 x weekly - 1 sets - 10 reps - 5" hold4  Date: 02/04/2023 Prepared by: Virgina Organ  Exercises - Supine Bridge  - 2 x daily - 7 x weekly - 1 sets - 10 reps - 5" hold - Supine Hamstring Stretch  - 2 x daily - 7 x weekly - 1 sets - 3 reps - 30" hold - Supine Single Knee to Chest Stretch  - 2 x daily - 7 x weekly - 1 sets - 3 reps - 30" hold  ASSESSMENT:  CLINICAL IMPRESSION: PT once again forgot where he put his exercise program and has not been completing his HEP at home.  PT continues to demonstrates decreased ROM, decreased core/LE strength, decreased balance and increased pain.  Mr. Amesquita will benefit from skilled PT to address these issues and decrease his pain while improving his functional ability.    OBJECTIVE IMPAIRMENTS: decreased activity tolerance, decreased balance, decreased ROM, decreased strength, and pain.   ACTIVITY LIMITATIONS: carrying, lifting, standing, and locomotion level  PARTICIPATION LIMITATIONS: shopping and community activity  PERSONAL FACTORS: Time since onset of injury/illness/exacerbation and 1-2 comorbidities: AKI, CVA  are  also affecting patient's functional outcome.   REHAB POTENTIAL: Good  CLINICAL DECISION MAKING: Stable/uncomplicated  EVALUATION COMPLEXITY: Low   GOALS: Goals reviewed with patient? No  SHORT TERM GOALS: Target date: 02/25/23  PT to be I in Hep in order to decrease his Rt buttock pain to a 5/10 Baseline: Goal status: On-going  2.  PT to be able to single leg stance on each foot for 15" to reduce risk of falling  Baseline:  Goal status:  On-going  3.  Pt core and LE  strength to be increased to allow pt to be able to come sit to stand 9 x in 30" Baseline:  Goal status:  On-going    LONG TERM GOALS: Target date: 03/18/23  PT to be I in Hep in order to decrease his Rt buttock pain to a 2/10  Goal status:  On-going   2.   PT to be able to single leg stance on each foot for 30" to reduce risk of falling  Baseline:  Goal status:  On-going  3.   Pt core and LE strength to be increased to allow pt to be able to come sit to stand 11 x in 30" Baseline:  Goal status:  On-going   PLAN:  PT FREQUENCY: 2x/week  PT DURATION: 6 weeks  PLANNED INTERVENTIONS: Therapeutic exercises, Neuromuscular re-education, Balance training, Patient/Family education, Self Care, and Manual therapy.  PLAN FOR NEXT SESSION: Continue Core and LE strength, add piriformis stretches, progress balance   Virgina Organ, PT CLT [phone]  02/17/2023, 4:01 PM

## 2023-02-19 ENCOUNTER — Other Ambulatory Visit: Payer: Self-pay | Admitting: Urology

## 2023-02-19 ENCOUNTER — Ambulatory Visit (HOSPITAL_COMMUNITY): Payer: Medicare PPO | Admitting: Physical Therapy

## 2023-02-19 DIAGNOSIS — M6281 Muscle weakness (generalized): Secondary | ICD-10-CM

## 2023-02-19 DIAGNOSIS — M5416 Radiculopathy, lumbar region: Secondary | ICD-10-CM

## 2023-02-19 DIAGNOSIS — N401 Enlarged prostate with lower urinary tract symptoms: Secondary | ICD-10-CM

## 2023-02-19 NOTE — Therapy (Signed)
OUTPATIENT PHYSICAL THERAPY THORACOLUMBAR Treatment    Patient Name: Joseph [name] MRN: [id number] DOB:05/06/1946, 77 y.o., male Today's Date: 02/19/2023  END OF SESSION:   PT End of Session - 02/19/23 0855     Visit Number 4    Number of Visits 12    Date for PT Re-Evaluation 03/18/23    Authorization Type humana    Authorization Time Period 7/25-9/5 12 visits    Authorization - Number of Visits 4    Progress Note Due on Visit 12    PT Start Time 0815    PT Stop Time 0855    PT Time Calculation (min) 40 min    Activity Tolerance Patient tolerated treatment well               Past Medical History:  Diagnosis Date   1st degree AV block    Atrial fibrillation (HCC)    Diastolic dysfunction 4/13   grade 1   Hypertension    New onset atrial fibrillation (HCC) 06/2015   RBBB    Sinus bradycardia    Stroke (HCC)    no defecits, pt denies having a stroke, says Dr Tresa Endo diagnosed this   Past Surgical History:  Procedure Laterality Date   BIV PACEMAKER INSERTION CRT-P N/A 01/26/2022   Procedure: BIV PACEMAKER INSERTION CRT-P;  Surgeon: Marinus Maw, MD;  [facility]: [facility];  Service: Cardiovascular;  Laterality: N/A;   COLONOSCOPY     COLONOSCOPY  12/30/2011   Procedure: COLONOSCOPY;  Surgeon: Corbin Ade, MD;  [facility];  Service: Endoscopy;  Laterality: N/A;  1:45PM   COLONOSCOPY N/A 04/19/2019   Procedure: COLONOSCOPY;  Surgeon: Corbin Ade, MD;  [facility]: [facility];  Service: Endoscopy;  Laterality: N/A;  2:00pm   EXCISION MASS LOWER EXTREMETIES Left 05/12/2018   Procedure: Left foot plantar fibroma excision;  Surgeon: Toni Arthurs, MD;  Location: [facility];  Service: Orthopedics;  Laterality: Left;   FOOT SURGERY Left    LEFT HEART CATH AND CORONARY ANGIOGRAPHY N/A 01/26/2022   Procedure: LEFT HEART CATH AND CORONARY ANGIOGRAPHY;  Surgeon: Lennette Bihari, MD;  [facility]: [facility];  Service:  Cardiovascular;  Laterality: N/A;   SPERMATOCELECTOMY Right 11/13/2019   Procedure: EXCISION OF SPERMATIC CORD CYST;  Surgeon: Malen Gauze, MD;  Location: [facility];  Service: Urology;  Laterality: Right;   VASECTOMY  11/13/2019   Procedure: VASECTOMY;  Surgeon: Malen Gauze, MD;  Location: [facility];  Service: Urology;;   Patient Active Problem List   Diagnosis Date Noted   AKI (acute kidney injury) (HCC) 02/03/2022   NICM (nonischemic cardiomyopathy) (HCC) 02/03/2022   Sinus node dysfunction (HCC)    Atrial flutter (HCC) 01/23/2022   OAB (overactive bladder) 12/27/2020   Nocturia 01/10/2020   Benign prostatic hyperplasia with urinary obstruction 01/10/2020   Non-recurrent unilateral inguinal hernia without obstruction or gangrene    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   History of adenomatous polyp of colon 02/27/2019   Paroxysmal atrial fibrillation (HCC) 07/18/2015   Bradycardia 06/29/2015   Near syncope    Atrial fibrillation with rapid ventricular response (HCC) 06/27/2015   Left ventricular diastolic dysfunction, NYHA class 3 10/26/2014   Kidney cysts 04/04/2013   HTN (hypertension) 03/31/2013    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 03/31/2013   Diastolic dysfunction- grade 2- echo 06/28/15 03/31/2013   RBBB 03/31/2013   AV block, 1st degree 03/31/2013   Sinus bradycardia 03/31/2013  Hematochezia 12/29/2011    PCP: Gareth Morgan  REFERRING PROVIDER: Gareth Morgan  REFERRING DIAG: Low back pain   Rationale for Evaluation and Treatment: Rehabilitation  THERAPY DIAG:  Lumbar radiculopathy Muscle weakness   ONSET DATE: 07/27/22                                                                                                                                                                                             SUBJECIVE STATEMENT:  Pt states that mornings are worse for him therefore his pain is high today states it is about a 7.  Pt has lost his exercises again and has not  been completing his HEP.  PERTINENT HISTORY:  CVA, AKI  PAIN:  Are you having pain? Yes: NPRS scale: 7/10; worst is an 8 Pain location: buttock  Pain description: aggravation  Aggravating factors: mornings  Relieving factors: walk around  PRECAUTIONS: None     WEIGHT BEARING RESTRICTIONS: No  FALLS:  Has patient fallen in last 6 months? No  LIVING ENVIRONMENT: Lives with: lives with their family Lives in: House/apartment Stairs: Yes: Internal: 12 steps; on right going up and External: 4 steps; on right going up; stairs does not bother the buttock pain  Has following equipment at home: None  OCCUPATION: retired  PLOF: Independent  PATIENT GOALS: less pain   OBJECTIVE:   PATIENT SURVEYS:  Difficult as pt has slight dementia, wife assists with questions.    COGNITION: Overall cognitive status: Within functional limits for tasks assessed       MUSCLE LENGTH: Hamstrings: Right 155 deg; Left 155 deg   POSTURE: No Significant postural limitations    LUMBAR ROM:   AROM eval  Flexion Fingers to toes   Extension 20  Right lateral flexion   Left lateral flexion   Right rotation   Left rotation    (Blank rows = not tested)  LOWER EXTREMITY ROM:   WFL    LOWER EXTREMITY MMT:    MMT Right eval Left eval  Hip flexion 4+ 5  Hip extension 3 3  Hip abduction 3+ 4+  Hip adduction    Hip internal rotation    Hip external rotation    Knee flexion 4- 5  Knee extension 5 5  Ankle dorsiflexion 5 5  Ankle plantarflexion    Ankle inversion    Ankle eversion     (Blank rows = not tested)  FUNCTIONAL TESTS:  30 seconds chair stand test:   6 , ( 8 is poor for pt age and sex, 42 is below average) 2 minute walk test:  339 ft pt begins  to drag his Rt foot after a minute.   Single leg stance: RT: 6"  , LT 6"    TODAY'S TREATMENT:                                                                                                                               DATE:  02/19/2023 Prone: Prone x 1 minute POE x 2 minute Press up x 5 B UE extension x 10 Glut set x 10 Single leg raise x 10  Attempted child pose too much knee pain Supine: Double knee to chest x 3 hold one minute  Piriformis stretch x 3 Bridge x 15 with hip adduction  Hamstring stretch 1:00 x 2 Side lying : Hip abduction x 10 with 3 #     02/17/23 Sitting : Piriformis stretch 3 x 30"  Sit to stand x 15  Supine: Knee to chest 3 x 30"  Trunk rotation  Hamstring stretch 3 x 30"  Bridge x 15  Prone : POE Press up x 5 Single leg raise x 10 Standing: Heel raise x 10  Squat x 10   02/09/23 Supine: Bridge x 10 Trunk rotation x 10  Knee to chest x 3 for 30" Side lying : Hip abduction x 10 both rt and lt Prone: POE  Sitting: Sit to stand x 10  Ankle dorsi/plantar x 10  Nustep level 2 x 5 minutes    02/04/24:  Evaluation   Bridge x 10 Knee to chest 3 x 30" Active hamstring stretch 3 x 30"  PATIENT EDUCATION:  Education details: HEP Person educated: Patient Education method: Chief Technology Officer Education comprehension: verbalized understanding  HOME EXERCISE PROGRAM: Access Code: [facility] URL: [email or web address]  02/17/23- sitting piriformis stretch x 30"  3x each  7/30/2- Supine Lower Trunk Rotation  - 2 x daily - 7 x weekly - 1 sets - 10 reps - 5" hold4  Date: 02/04/2023 Prepared by: Virgina Organ  Exercises - Supine Bridge  - 2 x daily - 7 x weekly - 1 sets - 10 reps - 5" hold - Supine Hamstring Stretch  - 2 x daily - 7 x weekly - 1 sets - 3 reps - 30" hold - Supine Single Knee to Chest Stretch  - 2 x daily - 7 x weekly - 1 sets - 3 reps - 30" hold  ASSESSMENT:  CLINICAL IMPRESSION: PT again forgot where he put his exercise program and has not been completing his HEP at home. Therapist once again gave pt a new HEP.  PT continues to have increased pain in the morning as well as decreased ROM, decreased core/LE strength,  decreased balance and increased pain.  Mr. Gluth will benefit from skilled PT to address these issues and decrease his pain while improving his functional ability.    OBJECTIVE IMPAIRMENTS: decreased activity tolerance, decreased balance, decreased ROM, decreased strength, and pain.   ACTIVITY LIMITATIONS: carrying, lifting, standing, and locomotion  level  PARTICIPATION LIMITATIONS: shopping and community activity  PERSONAL FACTORS: Time since onset of injury/illness/exacerbation and 1-2 comorbidities: AKI, CVA  are also affecting patient's functional outcome.   REHAB POTENTIAL: Good  CLINICAL DECISION MAKING: Stable/uncomplicated  EVALUATION COMPLEXITY: Low   GOALS: Goals reviewed with patient? No  SHORT TERM GOALS: Target date: 02/25/23  PT to be I in Hep in order to decrease his Rt buttock pain to a 5/10 Baseline: Goal status: On-going  2.  PT to be able to single leg stance on each foot for 15" to reduce risk of falling  Baseline:  Goal status:  On-going  3.  Pt core and LE strength to be increased to allow pt to be able to come sit to stand 9 x in 30" Baseline:  Goal status:  On-going    LONG TERM GOALS: Target date: 03/18/23  PT to be I in Hep in order to decrease his Rt buttock pain to a 2/10  Goal status:  On-going   2.   PT to be able to single leg stance on each foot for 30" to reduce risk of falling  Baseline:  Goal status:  On-going  3.   Pt core and LE strength to be increased to allow pt to be able to come sit to stand 11 x in 30" Baseline:  Goal status:  On-going   PLAN:  PT FREQUENCY: 2x/week  PT DURATION: 6 weeks  PLANNED INTERVENTIONS: Therapeutic exercises, Neuromuscular re-education, Balance training, Patient/Family education, Self Care, and Manual therapy.  PLAN FOR NEXT SESSION: Continue Core and LE strength, add piriformis stretches, progress balance   Virgina Organ, PT CLT [phone]  02/19/2023, 9:08 AM

## 2023-02-23 ENCOUNTER — Encounter (HOSPITAL_COMMUNITY): Payer: Self-pay

## 2023-02-23 ENCOUNTER — Ambulatory Visit (HOSPITAL_COMMUNITY): Payer: Medicare PPO

## 2023-02-23 DIAGNOSIS — M6281 Muscle weakness (generalized): Secondary | ICD-10-CM

## 2023-02-23 DIAGNOSIS — M5416 Radiculopathy, lumbar region: Secondary | ICD-10-CM

## 2023-02-23 NOTE — Therapy (Addendum)
OUTPATIENT PHYSICAL THERAPY THORACOLUMBAR Treatment    Patient Name: Joseph [name] MRN: [id number] DOB:1945/08/12, 77 y.o., male Today's Date: 02/23/2023  END OF SESSION: END OF SESSION:   PT End of Session - 02/23/23 1302     Visit Number 5    Number of Visits 12    Date for PT Re-Evaluation 03/18/23    Authorization Type humana    Authorization Time Period 7/25-9/5 12 visits    Authorization - Number of Visits 5    Progress Note Due on Visit 12    PT Start Time 1302    PT Stop Time 1351    PT Time Calculation (min) 49 min    Activity Tolerance Patient tolerated treatment well    Behavior During Therapy [facility] for tasks assessed/performed             Past Medical History:  Diagnosis Date   1st degree AV block    Atrial fibrillation (HCC)    Diastolic dysfunction 4/13   grade 1   Hypertension    New onset atrial fibrillation (HCC) 06/2015   RBBB    Sinus bradycardia    Stroke (HCC)    no defecits, pt denies having a stroke, says Dr Tresa Endo diagnosed this   Past Surgical History:  Procedure Laterality Date   BIV PACEMAKER INSERTION CRT-P N/A 01/26/2022   Procedure: BIV PACEMAKER INSERTION CRT-P;  Surgeon: Marinus Maw, MD;  Location: [facility];  Service: Cardiovascular;  Laterality: N/A;   COLONOSCOPY     COLONOSCOPY  12/30/2011   Procedure: COLONOSCOPY;  Surgeon: Corbin Ade, MD;  Location: [facility];  Service: Endoscopy;  Laterality: N/A;  1:45PM   COLONOSCOPY N/A 04/19/2019   Procedure: COLONOSCOPY;  Surgeon: Corbin Ade, MD;  [facility]: [facility];  Service: Endoscopy;  Laterality: N/A;  2:00pm   EXCISION MASS LOWER EXTREMETIES Left 05/12/2018   Procedure: Left foot plantar fibroma excision;  Surgeon: Toni Arthurs, MD;  Location: [facility];  Service: Orthopedics;  Laterality: Left;   FOOT SURGERY Left    LEFT HEART CATH AND CORONARY ANGIOGRAPHY N/A 01/26/2022   Procedure: LEFT HEART CATH AND CORONARY ANGIOGRAPHY;  Surgeon:  Lennette Bihari, MD;  Location: [facility];  Service: Cardiovascular;  Laterality: N/A;   SPERMATOCELECTOMY Right 11/13/2019   Procedure: EXCISION OF SPERMATIC CORD CYST;  Surgeon: Malen Gauze, MD;  Location: [facility];  Service: Urology;  Laterality: Right;   VASECTOMY  11/13/2019   Procedure: VASECTOMY;  Surgeon: Malen Gauze, MD;  Location: [facility];  Service: Urology;;   Patient Active Problem List   Diagnosis Date Noted   AKI (acute kidney injury) (HCC) 02/03/2022   NICM (nonischemic cardiomyopathy) (HCC) 02/03/2022   Sinus node dysfunction (HCC)    Atrial flutter (HCC) 01/23/2022   OAB (overactive bladder) 12/27/2020   Nocturia 01/10/2020   Benign prostatic hyperplasia with urinary obstruction 01/10/2020   Non-recurrent unilateral inguinal hernia without obstruction or gangrene    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   History of adenomatous polyp of colon 02/27/2019   Paroxysmal atrial fibrillation (HCC) 07/18/2015   Bradycardia 06/29/2015   Near syncope    Atrial fibrillation with rapid ventricular response (HCC) 06/27/2015   Left ventricular diastolic dysfunction, NYHA class 3 10/26/2014   Kidney cysts 04/04/2013   HTN (hypertension) 03/31/2013    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 03/31/2013   Diastolic dysfunction- grade 2- echo 06/28/15 03/31/2013   RBBB 03/31/2013  AV block, 1st degree 03/31/2013   Sinus bradycardia 03/31/2013   Hematochezia 12/29/2011    PCP: Gareth Morgan  REFERRING PROVIDER: Gareth Morgan  REFERRING DIAG: Low back pain   Rationale for Evaluation and Treatment: Rehabilitation  THERAPY DIAG:  Lumbar radiculopathy Muscle weakness   ONSET DATE: 07/27/22                                                                                                                                                                                             SUBJECIVE STATEMENT:  Pt stated he continues to have pain in the mornings, no pain this afternoon.   Admits he has not been completeing his HEP as often as he should.  PERTINENT HISTORY:  CVA, AKI  PAIN:  Are you having pain? Yes: NPRS scale: 7/10; worst is an 8 Pain location: buttock  Pain description: aggravation  Aggravating factors: mornings  Relieving factors: walk around  PRECAUTIONS: None     WEIGHT BEARING RESTRICTIONS: No  FALLS:  Has patient fallen in last 6 months? No  LIVING ENVIRONMENT: Lives with: lives with their family Lives in: House/apartment Stairs: Yes: Internal: 12 steps; on right going up and External: 4 steps; on right going up; stairs does not bother the buttock pain  Has following equipment at home: None  OCCUPATION: retired  PLOF: Independent  PATIENT GOALS: less pain   OBJECTIVE:   PATIENT SURVEYS:  Difficult as pt has slight dementia, wife assists with questions.    COGNITION: Overall cognitive status: Within functional limits for tasks assessed       MUSCLE LENGTH: Hamstrings: Right 155 deg; Left 155 deg   POSTURE: No Significant postural limitations    LUMBAR ROM:   AROM eval  Flexion Fingers to toes   Extension 20  Right lateral flexion   Left lateral flexion   Right rotation   Left rotation    (Blank rows = not tested)  LOWER EXTREMITY ROM:   WFL    LOWER EXTREMITY MMT:    MMT Right eval Left eval  Hip flexion 4+ 5  Hip extension 3 3  Hip abduction 3+ 4+  Hip adduction    Hip internal rotation    Hip external rotation    Knee flexion 4- 5  Knee extension 5 5  Ankle dorsiflexion 5 5  Ankle plantarflexion    Ankle inversion    Ankle eversion     (Blank rows = not tested)  FUNCTIONAL TESTS:  30 seconds chair stand test:   6 , ( 8 is poor for pt age and sex, 58 is below average) 2 minute walk  test:  339 ft pt begins to drag his Rt foot after a minute.   Single leg stance: RT: 6"  , LT 6"    TODAY'S TREATMENT:                                                                                                                               DATE:  02/23/23 Prone: POE x 2 minutes Press ups 5x Hip extension 10x 3" holds Side lying : Hip abduction x 10 with 3 #   Supine: DKTC with feet on green theraball 10x 5-10" Double knee to chest x 2 hold one minute  Piriformis stretch 3x 1 min Hamstring 2x 1 min  STS 10 eccentric control  02/19/2023 Prone: Prone x 1 minute POE x 2 minute Press up x 5 B UE extension x 10 Glut set x 10 Single leg raise x 10  Attempted child pose too much knee pain Supine: Double knee to chest x 3 hold one minute  Piriformis stretch x 3 Bridge x 15 with hip adduction  Hamstring stretch 1:00 x 2 Side lying : Hip abduction x 10 with 3 #     02/17/23 Sitting : Piriformis stretch 3 x 30"  Sit to stand x 15  Supine: Knee to chest 3 x 30"  Trunk rotation  Hamstring stretch 3 x 30"  Bridge x 15  Prone : POE Press up x 5 Single leg raise x 10 Standing: Heel raise x 10  Squat x 10   02/09/23 Supine: Bridge x 10 Trunk rotation x 10  Knee to chest x 3 for 30" Side lying : Hip abduction x 10 both rt and lt Prone: POE  Sitting: Sit to stand x 10  Ankle dorsi/plantar x 10  Nustep level 2 x 5 minutes    02/04/24:  Evaluation   Bridge x 10 Knee to chest 3 x 30" Active hamstring stretch 3 x 30"  PATIENT EDUCATION:  Education details: HEP Person educated: Patient Education method: Chief Technology Officer Education comprehension: verbalized understanding  HOME EXERCISE PROGRAM: Access Code: [facility] URL: [email or web address]  02/17/23- sitting piriformis stretch x 30"  3x each  7/30/2- Supine Lower Trunk Rotation  - 2 x daily - 7 x weekly - 1 sets - 10 reps - 5" hold4  Date: 02/04/2023 Prepared by: Virgina Organ  Exercises - Supine Bridge  - 2 x daily - 7 x weekly - 1 sets - 10 reps - 5" hold - Supine Hamstring Stretch  - 2 x daily - 7 x weekly - 1 sets - 3 reps - 30" hold - Supine Single Knee to Chest Stretch  - 2 x  daily - 7 x weekly - 1 sets - 3 reps - 30" hold  ASSESSMENT:  CLINICAL IMPRESSION:  Session focus with proximal strengthening and mobility.  Pt presents with decreased ROM and decreased core/LE strengthen and impaired balance.  Cueing required for form with mat activities  for maximal gluteal strengthening to reduce compensation (ex: hip abduction vs flexion in sidelying exercises).  No reports of pain through session, was limited by fatigue with activities.  Reviewed importance of HEP compliance for maximal benefits.  Pt stated his wife notices increased ER Rt foot, educated on anatomy with tight ER of the hip and educated stretches to assist with this.   OBJECTIVE IMPAIRMENTS: decreased activity tolerance, decreased balance, decreased ROM, decreased strength, and pain.   ACTIVITY LIMITATIONS: carrying, lifting, standing, and locomotion level  PARTICIPATION LIMITATIONS: shopping and community activity  PERSONAL FACTORS: Time since onset of injury/illness/exacerbation and 1-2 comorbidities: AKI, CVA  are also affecting patient's functional outcome.   REHAB POTENTIAL: Good  CLINICAL DECISION MAKING: Stable/uncomplicated  EVALUATION COMPLEXITY: Low   GOALS: Goals reviewed with patient? No  SHORT TERM GOALS: Target date: 02/25/23  PT to be I in Hep in order to decrease his Rt buttock pain to a 5/10 Baseline: Goal status: On-going  2.  PT to be able to single leg stance on each foot for 15" to reduce risk of falling  Baseline:  Goal status:  On-going  3.  Pt core and LE strength to be increased to allow pt to be able to come sit to stand 9 x in 30" Baseline:  Goal status:  On-going    LONG TERM GOALS: Target date: 03/18/23  PT to be I in Hep in order to decrease his Rt buttock pain to a 2/10  Goal status:  On-going   2.   PT to be able to single leg stance on each foot for 30" to reduce risk of falling  Baseline:  Goal status:  On-going  3.   Pt core and LE strength to be  increased to allow pt to be able to come sit to stand 11 x in 30" Baseline:  Goal status:  On-going   PLAN:  PT FREQUENCY: 2x/week  PT DURATION: 6 weeks  PLANNED INTERVENTIONS: Therapeutic exercises, Neuromuscular re-education, Balance training, Patient/Family education, Self Care, and Manual therapy.  PLAN FOR NEXT SESSION: Continue Core and LE strength, add piriformis stretches, progress balance   Becky Sax, LPTA/CLT; CBIS [phone]  Juel Burrow, PTA 02/23/2023, 1:54 PM  02/23/2023, 1:54 PM

## 2023-02-25 ENCOUNTER — Ambulatory Visit (INDEPENDENT_AMBULATORY_CARE_PROVIDER_SITE_OTHER): Payer: Medicare PPO | Admitting: Family Medicine

## 2023-02-25 ENCOUNTER — Encounter: Payer: Self-pay | Admitting: Family Medicine

## 2023-02-25 VITALS — BP 132/79 | HR 60 | Ht 70.0 in | Wt 214.0 lb

## 2023-02-25 DIAGNOSIS — Z131 Encounter for screening for diabetes mellitus: Secondary | ICD-10-CM

## 2023-02-25 DIAGNOSIS — Z1329 Encounter for screening for other suspected endocrine disorder: Secondary | ICD-10-CM

## 2023-02-25 DIAGNOSIS — E559 Vitamin D deficiency, unspecified: Secondary | ICD-10-CM

## 2023-02-25 DIAGNOSIS — Z13220 Encounter for screening for lipoid disorders: Secondary | ICD-10-CM

## 2023-02-25 DIAGNOSIS — E538 Deficiency of other specified B group vitamins: Secondary | ICD-10-CM | POA: Diagnosis not present

## 2023-02-25 DIAGNOSIS — Z1159 Encounter for screening for other viral diseases: Secondary | ICD-10-CM

## 2023-02-25 DIAGNOSIS — I10 Essential (primary) hypertension: Secondary | ICD-10-CM

## 2023-02-25 MED ORDER — LEVOCETIRIZINE DIHYDROCHLORIDE 5 MG PO TABS
5.0000 mg | ORAL_TABLET | Freq: Every evening | ORAL | 3 refills | Status: DC
Start: 2023-02-25 — End: 2023-11-02

## 2023-02-25 NOTE — Progress Notes (Signed)
New Patient Office Visit   Subjective   Patient ID: Joseph [name], male    DOB: 1946/05/04  Age: 77 y.o. MRN: [id number]  CC:  Chief Complaint  Patient presents with   Establish Care    Patient is here to establish care.     HPI Joseph [name] 77 year old male, presents to establish care. He  has a past medical history of 1st degree AV block, Atrial fibrillation (HCC), Diastolic dysfunction (4/13), Hypertension, New onset atrial fibrillation (HCC) (06/2015), RBBB, Sinus bradycardia, and Stroke (HCC).  Hypertension This is a chronic problem. Blood pressure controlled in today's visit. Pertinent negatives include no blurred vision, chest pain, headaches or shortness of breath. Risk factors for coronary artery disease include dyslipidemia and sedentary lifestyle. Past treatments include beta blockers, lifestyle changes and diuretics ,alpha-2 adrenergic agonist.Compliance problems include exercise and psychosocial issues. Hypertensive end-organ damage includes kidney disease and Hx CAD/MI. Identifiable causes of hypertension include chronic renal disease.        Outpatient Encounter Medications as of 02/25/2023  Medication Sig   acetaminophen (TYLENOL) 325 MG tablet Take 2 tablets (650 mg total) by mouth every 6 (six) hours as needed for mild pain, fever or headache (or Fever >/= 101).   alfuzosin (UROXATRAL) 10 MG 24 hr tablet Take 1 tablet (10 mg total) by mouth daily with breakfast.   apixaban (ELIQUIS) 5 MG TABS tablet Take 1 tablet (5 mg total) by mouth 2 (two) times daily.   B Complex Vitamins (VITAMIN B COMPLEX) TABS Take 1 tablet by mouth daily.   carvedilol (COREG) 12.5 MG tablet Take 1 tablet (12.5 mg total) by mouth 2 (two) times daily.   cholecalciferol (VITAMIN D) 25 MCG (1000 UT) tablet Take 1,000 Units by mouth daily.   donepezil (ARICEPT) 5 MG tablet Take 5 mg by mouth daily.   doxazosin (CARDURA) 4 MG tablet Take 4 mg by mouth at bedtime.   ENTRESTO 24-26 MG Take  1 tablet by mouth 2 (two) times daily.   hydrochlorothiazide (HYDRODIURIL) 25 MG tablet Take 25 mg by mouth daily.   latanoprost (XALATAN) 0.005 % ophthalmic solution Place 1 drop into both eyes at bedtime.   levocetirizine (XYZAL) 5 MG tablet Take 1 tablet (5 mg total) by mouth every evening.   LORazepam (ATIVAN) 0.5 MG tablet Take 0.5 mg by mouth daily as needed for sleep.   Misc Natural Products (PROSTATE HEALTH PO) Take 1 capsule by mouth daily. Super Beta Prostate   Multiple Vitamin (MULTIVITAMIN WITH MINERALS) TABS tablet Take 1 tablet by mouth daily.   MYRBETRIQ 50 MG TB24 tablet TAKE 1 TABLET(50 MG) BY MOUTH DAILY   Polyethyl Glycol-Propyl Glycol (LUBRICANT EYE DROPS) 0.4-0.3 % SOLN Place 1 drop into both eyes 3 (three) times daily as needed (dry/irritated eyes.).   sacubitril-valsartan (ENTRESTO) 49-51 MG Take 1 tablet by mouth 2 (two) times daily.   saw palmetto 500 MG capsule Take 500 mg by mouth every evening.   solifenacin (VESICARE) 5 MG tablet Take 1 tablet (5 mg total) by mouth at bedtime.   spironolactone (ALDACTONE) 25 MG tablet TAKE 1/2 TABLET BY MOUTH DAILY   Turmeric 500 MG CAPS Take 500 mg by mouth daily.   cloNIDine (CATAPRES) 0.1 MG tablet Take 1 tablet (0.1 mg total) by mouth 2 (two) times daily.   [DISCONTINUED] doxazosin (CARDURA) 8 MG tablet Take 0.5 tablets (4 mg total) by mouth at bedtime.   No facility-administered encounter medications on file as of 02/25/2023.  Past Surgical History:  Procedure Laterality Date   BIV PACEMAKER INSERTION CRT-P N/A 01/26/2022   Procedure: BIV PACEMAKER INSERTION CRT-P;  Surgeon: Marinus Maw, MD;  Location: [facility];  Service: Cardiovascular;  Laterality: N/A;   COLONOSCOPY     COLONOSCOPY  12/30/2011   Procedure: COLONOSCOPY;  Surgeon: Corbin Ade, MD;  [facility];  Service: Endoscopy;  Laterality: N/A;  1:45PM   COLONOSCOPY N/A 04/19/2019   Procedure: COLONOSCOPY;  Surgeon: Corbin Ade, MD;   [facility];  Service: Endoscopy;  Laterality: N/A;  2:00pm   EXCISION MASS LOWER EXTREMETIES Left 05/12/2018   Procedure: Left foot plantar fibroma excision;  Surgeon: Toni Arthurs, MD;  Location: [facility];  Service: Orthopedics;  Laterality: Left;   FOOT SURGERY Left    LEFT HEART CATH AND CORONARY ANGIOGRAPHY N/A 01/26/2022   Procedure: LEFT HEART CATH AND CORONARY ANGIOGRAPHY;  Surgeon: Lennette Bihari, MD;  Location: [facility];  Service: Cardiovascular;  Laterality: N/A;   SPERMATOCELECTOMY Right 11/13/2019   Procedure: EXCISION OF SPERMATIC CORD CYST;  Surgeon: Malen Gauze, MD;  Location: [facility];  Service: Urology;  Laterality: Right;   VASECTOMY  11/13/2019   Procedure: VASECTOMY;  Surgeon: Malen Gauze, MD;  Location: [facility];  Service: Urology;;    Review of Systems  Constitutional:  Negative for chills and fever.  HENT:  Negative for tinnitus.   Gastrointestinal:  Negative for abdominal pain.  Genitourinary:  Negative for dysuria.  Skin:  Negative for rash.  Neurological:  Negative for dizziness.      Objective    BP 132/79   Pulse 60   Ht 5\' 10"  (1.778 m)   Wt 214 lb (97.1 kg)   SpO2 96%   BMI 30.71 kg/m   Physical Exam Vitals reviewed.  Constitutional:      General: He is not in acute distress.    Appearance: Normal appearance. He is not ill-appearing, toxic-appearing or diaphoretic.  HENT:     Head: Normocephalic.  Eyes:     General:        Right eye: No discharge.        Left eye: No discharge.     Conjunctiva/sclera: Conjunctivae normal.  Cardiovascular:     Rate and Rhythm: Normal rate.     Pulses: Normal pulses.  Pulmonary:     Effort: Pulmonary effort is normal. No respiratory distress.     Breath sounds: Normal breath sounds.  Abdominal:     General: Bowel sounds are normal.     Palpations: Abdomen is soft.     Tenderness: There is no abdominal tenderness. There is no right CVA tenderness, left CVA  tenderness or guarding.  Musculoskeletal:     Cervical back: Normal range of motion.     Lumbar back: Decreased range of motion.  Skin:    General: Skin is warm and dry.     Capillary Refill: Capillary refill takes less than 2 seconds.  Neurological:     Coordination: Coordination abnormal.  Psychiatric:        Mood and Affect: Mood normal.        Behavior: Behavior normal.       Assessment & Plan:  Primary hypertension Assessment & Plan: Vitals:   02/25/23 1030 02/25/23 1055  BP: (!) 172/80 132/80  Initial visit, Labs ordered in today's visit Followed by Cardiology for CHF Patient reports taking Clonidine 0.1 mg 2 times daily,  carvedilol 12.5 mg 2 times daily and hydrochlorothiazide 25 mg daily. Advise on DASH diet, low sodium diet and maintain a exercise routine for 150 minutes per week.   Orders: -     CBC with Differential/Platelet -     CMP14+EGFR -     Microalbumin / creatinine urine ratio  Screening for diabetes mellitus -     Hemoglobin A1c  Screening for lipid disorders -     Lipid panel  Screening for thyroid disorder -     TSH + free T4  Need for hepatitis C screening test -     Hepatitis C antibody  Vitamin D deficiency -     VITAMIN D 25 Hydroxy (Vit-D Deficiency, Fractures)  Vitamin B12 deficiency -     B12 and Folate Panel  Other orders -     Levocetirizine Dihydrochloride; Take 1 tablet (5 mg total) by mouth every evening.  Dispense: 30 tablet; Refill: 3    Return in about 4 months (around 06/27/2023), or if symptoms worsen or fail to improve, for hypertension, chronic follow-up, medication managment.   Cruzita Lederer Newman Nip, FNP

## 2023-02-25 NOTE — Assessment & Plan Note (Signed)
Vitals:   02/25/23 1030 02/25/23 1055  BP: (!) 172/80 132/80  Initial visit, Labs ordered in today's visit Followed by Cardiology for CHF Patient reports taking Clonidine 0.1 mg 2 times daily, carvedilol 12.5 mg 2 times daily and hydrochlorothiazide 25 mg daily. Advise on DASH diet, low sodium diet and maintain a exercise routine for 150 minutes per week.

## 2023-02-25 NOTE — Patient Instructions (Signed)

## 2023-02-26 ENCOUNTER — Encounter (HOSPITAL_COMMUNITY): Payer: Self-pay

## 2023-02-26 ENCOUNTER — Ambulatory Visit (HOSPITAL_COMMUNITY): Payer: Medicare PPO

## 2023-02-26 DIAGNOSIS — M5416 Radiculopathy, lumbar region: Secondary | ICD-10-CM

## 2023-02-26 DIAGNOSIS — M6281 Muscle weakness (generalized): Secondary | ICD-10-CM

## 2023-02-26 NOTE — Therapy (Signed)
OUTPATIENT PHYSICAL THERAPY THORACOLUMBAR Treatment    Patient Name: Joseph [name] MRN: [id number] DOB:Oct 11, 1945, 77 y.o., male Today's Date: 02/26/2023  END OF SESSION: END OF SESSION:   PT End of Session - 02/26/23 1459     Visit Number 6    Number of Visits 12    Date for PT Re-Evaluation 03/18/23    Authorization Type humana    Authorization Time Period 7/25-9/5 12 visits    Authorization - Number of Visits 6    Progress Note Due on Visit 12    PT Start Time 1450    PT Stop Time 1530    PT Time Calculation (min) 40 min    Activity Tolerance Patient tolerated treatment well    Behavior During Therapy [facility] for tasks assessed/performed             Past Medical History:  Diagnosis Date   1st degree AV block    Atrial fibrillation (HCC)    Diastolic dysfunction 4/13   grade 1   Hypertension    New onset atrial fibrillation (HCC) 06/2015   RBBB    Sinus bradycardia    Stroke (HCC)    no defecits, pt denies having a stroke, says Dr Tresa Endo diagnosed this   Past Surgical History:  Procedure Laterality Date   BIV PACEMAKER INSERTION CRT-P N/A 01/26/2022   Procedure: BIV PACEMAKER INSERTION CRT-P;  Surgeon: Marinus Maw, MD;  [facility];  Service: Cardiovascular;  Laterality: N/A;   COLONOSCOPY     COLONOSCOPY  12/30/2011   Procedure: COLONOSCOPY;  Surgeon: Corbin Ade, MD;  [facility]: [facility];  Service: Endoscopy;  Laterality: N/A;  1:45PM   COLONOSCOPY N/A 04/19/2019   Procedure: COLONOSCOPY;  Surgeon: Corbin Ade, MD;  [facility]: [facility];  Service: Endoscopy;  Laterality: N/A;  2:00pm   EXCISION MASS LOWER EXTREMETIES Left 05/12/2018   Procedure: Left foot plantar fibroma excision;  Surgeon: Toni Arthurs, MD;  Location: [facility];  Service: Orthopedics;  Laterality: Left;   FOOT SURGERY Left    LEFT HEART CATH AND CORONARY ANGIOGRAPHY N/A 01/26/2022   Procedure: LEFT HEART CATH AND CORONARY ANGIOGRAPHY;  Surgeon:  Lennette Bihari, MD;  [facility]: [facility];  Service: Cardiovascular;  Laterality: N/A;   SPERMATOCELECTOMY Right 11/13/2019   Procedure: EXCISION OF SPERMATIC CORD CYST;  Surgeon: Malen Gauze, MD;  Location: [facility];  Service: Urology;  Laterality: Right;   VASECTOMY  11/13/2019   Procedure: VASECTOMY;  Surgeon: Malen Gauze, MD;  Location: [facility];  Service: Urology;;   Patient Active Problem List   Diagnosis Date Noted   AKI (acute kidney injury) (HCC) 02/03/2022   NICM (nonischemic cardiomyopathy) (HCC) 02/03/2022   Sinus node dysfunction (HCC)    Atrial flutter (HCC) 01/23/2022   OAB (overactive bladder) 12/27/2020   Nocturia 01/10/2020   Benign prostatic hyperplasia with urinary obstruction 01/10/2020   Non-recurrent unilateral inguinal hernia without obstruction or gangrene    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   History of adenomatous polyp of colon 02/27/2019   Paroxysmal atrial fibrillation (HCC) 07/18/2015   Bradycardia 06/29/2015   Near syncope    Atrial fibrillation with rapid ventricular response (HCC) 06/27/2015   Left ventricular diastolic dysfunction, NYHA class 3 10/26/2014   Kidney cysts 04/04/2013   HTN (hypertension) 03/31/2013    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 03/31/2013   Diastolic dysfunction- grade 2- echo 06/28/15 03/31/2013   RBBB 03/31/2013  AV block, 1st degree 03/31/2013   Sinus bradycardia 03/31/2013   Hematochezia 12/29/2011    PCP: Gareth Morgan  REFERRING PROVIDER: Gareth Morgan  REFERRING DIAG: Low back pain   Rationale for Evaluation and Treatment: Rehabilitation  THERAPY DIAG:  Lumbar radiculopathy Muscle weakness   ONSET DATE: 07/27/22                                                                                                                                                                                             SUBJECIVE STATEMENT:  Pt stated he continues to have pain in the mornings, no pain this afternoon.   Admits he has not been completeing his HEP as often as he should.   PERTINENT HISTORY:  CVA, AKI  PAIN:  Are you having pain? Yes: NPRS scale: 7/10; worst is an 8.   Pain location: buttock  Pain description: aggravation  Aggravating factors: mornings  Relieving factors: walk around  PRECAUTIONS: None     WEIGHT BEARING RESTRICTIONS: No  FALLS:  Has patient fallen in last 6 months? No  LIVING ENVIRONMENT: Lives with: lives with their family Lives in: House/apartment Stairs: Yes: Internal: 12 steps; on right going up and External: 4 steps; on right going up; stairs does not bother the buttock pain  Has following equipment at home: None  OCCUPATION: retired  PLOF: Independent  PATIENT GOALS: less pain   OBJECTIVE:   PATIENT SURVEYS:  Difficult as pt has slight dementia, wife assists with questions.    COGNITION: Overall cognitive status: Within functional limits for tasks assessed       MUSCLE LENGTH: Hamstrings: Right 155 deg; Left 155 deg   POSTURE: No Significant postural limitations    LUMBAR ROM:   AROM eval  Flexion Fingers to toes   Extension 20  Right lateral flexion   Left lateral flexion   Right rotation   Left rotation    (Blank rows = not tested)  LOWER EXTREMITY ROM:   WFL    LOWER EXTREMITY MMT:    MMT Right eval Left eval  Hip flexion 4+ 5  Hip extension 3 3  Hip abduction 3+ 4+  Hip adduction    Hip internal rotation    Hip external rotation    Knee flexion 4- 5  Knee extension 5 5  Ankle dorsiflexion 5 5  Ankle plantarflexion    Ankle inversion    Ankle eversion     (Blank rows = not tested)  FUNCTIONAL TESTS:  30 seconds chair stand test:   6 , ( 8 is poor for pt age and sex, 78 is below average)  2 minute walk test:  339 ft pt begins to drag his Rt foot after a minute.   Single leg stance: RT: 6"  , LT 6"    TODAY'S TREATMENT:                                                                                                                               DATE:  02/26/23: Standing: Weight shifting with UE overhead 5x Rotate 5x Controlled STS no HHA 5x  2 sets Lumbar extension 5x Prone: POE x 2 minutes Press ups 5x Hip extension 10x 3" holds Child's pose 2x 30" Side lying : Hip abduction x 10 with 3 #   Supine: DKTC 2x 1' hold Seated: Piriformis stretch 3x 30"  02/23/23 Prone: POE x 2 minutes Press ups 5x Hip extension 10x 3" holds Side lying : Hip abduction x 10 with 3 #   Supine: DKTC with feet on green theraball 10x 5-10" Double knee to chest x 2 hold one minute  Piriformis stretch 3x 1 min Hamstring 2x 1 min  STS 10 eccentric control  02/19/2023 Prone: Prone x 1 minute POE x 2 minute Press up x 5 B UE extension x 10 Glut set x 10 Single leg raise x 10  Attempted child pose too much knee pain Supine: Double knee to chest x 3 hold one minute  Piriformis stretch x 3 Bridge x 15 with hip adduction  Hamstring stretch 1:00 x 2 Side lying : Hip abduction x 10 with 3 #     02/17/23 Sitting : Piriformis stretch 3 x 30"  Sit to stand x 15  Supine: Knee to chest 3 x 30"  Trunk rotation  Hamstring stretch 3 x 30"  Bridge x 15  Prone : POE Press up x 5 Single leg raise x 10 Standing: Heel raise x 10  Squat x 10   02/09/23 Supine: Bridge x 10 Trunk rotation x 10  Knee to chest x 3 for 30" Side lying : Hip abduction x 10 both rt and lt Prone: POE  Sitting: Sit to stand x 10  Ankle dorsi/plantar x 10  Nustep level 2 x 5 minutes    02/04/24:  Evaluation   Bridge x 10 Knee to chest 3 x 30" Active hamstring stretch 3 x 30"  PATIENT EDUCATION:  Education details: HEP Person educated: Patient Education method: Chief Technology Officer Education comprehension: verbalized understanding  HOME EXERCISE PROGRAM: Access Code: [facility] URL: [email or web address] 02/26/23: childs pose  02/17/23- sitting piriformis stretch x 30"  3x each  7/30/2-  Supine Lower Trunk Rotation  - 2 x daily - 7 x weekly - 1 sets - 10 reps - 5" hold4  Date: 02/04/2023 Prepared by: Virgina Organ  Exercises - Supine Bridge  - 2 x daily - 7 x weekly - 1 sets - 10 reps - 5" hold - Supine Hamstring Stretch  - 2 x daily - 7 x weekly -  1 sets - 3 reps - 30" hold - Supine Single Knee to Chest Stretch  - 2 x daily - 7 x weekly - 1 sets - 3 reps - 30" hold  ASSESSMENT:  CLINICAL IMPRESSION:  Pt admits he does not know where the HEP copies are at home, given another copy.  Therapist requested pt to have wife attend next session to review HEP for improved follow through with therapy, verbalized understanding.  Session focus with proximal strengthening and mobility.    Added 3D hip excursion for standing mobility.  Pt able to complete all exercises with multimodal cueing for proper form and hold time.  No reports of increased pain at EOS.     OBJECTIVE IMPAIRMENTS: decreased activity tolerance, decreased balance, decreased ROM, decreased strength, and pain.   ACTIVITY LIMITATIONS: carrying, lifting, standing, and locomotion level  PARTICIPATION LIMITATIONS: shopping and community activity  PERSONAL FACTORS: Time since onset of injury/illness/exacerbation and 1-2 comorbidities: AKI, CVA  are also affecting patient's functional outcome.   REHAB POTENTIAL: Good  CLINICAL DECISION MAKING: Stable/uncomplicated  EVALUATION COMPLEXITY: Low   GOALS: Goals reviewed with patient? No  SHORT TERM GOALS: Target date: 02/25/23  PT to be I in Hep in order to decrease his Rt buttock pain to a 5/10 Baseline: Goal status: On-going  2.  PT to be able to single leg stance on each foot for 15" to reduce risk of falling  Baseline:  Goal status:  On-going  3.  Pt core and LE strength to be increased to allow pt to be able to come sit to stand 9 x in 30" Baseline:  Goal status:  On-going    LONG TERM GOALS: Target date: 03/18/23  PT to be I in Hep in order to  decrease his Rt buttock pain to a 2/10  Goal status:  On-going   2.   PT to be able to single leg stance on each foot for 30" to reduce risk of falling  Baseline:  Goal status:  On-going  3.   Pt core and LE strength to be increased to allow pt to be able to come sit to stand 11 x in 30" Baseline:  Goal status:  On-going   PLAN:  PT FREQUENCY: 2x/week  PT DURATION: 6 weeks  PLANNED INTERVENTIONS: Therapeutic exercises, Neuromuscular re-education, Balance training, Patient/Family education, Self Care, and Manual therapy.  PLAN FOR NEXT SESSION: Continue Core and LE strength, add piriformis stretches, progress balance   Becky Sax, LPTA/CLT; CBIS [phone]  Juel Burrow, PTA 02/26/2023, 3:43 PM  02/26/2023, 3:43 PM

## 2023-02-27 LAB — LIPID PANEL
Chol/HDL Ratio: 3.3 ratio (ref 0.0–5.0)
Cholesterol, Total: 196 mg/dL (ref 100–199)
HDL: 60 mg/dL (ref >39)
LDL Chol Calc (NIH): 121 mg/dL — ABNORMAL HIGH (ref 0–99)
Triglycerides: 80 mg/dL (ref 0–149)
VLDL Cholesterol Cal: 15 mg/dL (ref 5–40)

## 2023-02-27 LAB — CMP14+EGFR
ALT: 11 IU/L (ref 0–44)
AST: 21 IU/L (ref 0–40)
Albumin: 4.2 g/dL (ref 3.8–4.8)
Alkaline Phosphatase: 88 IU/L (ref 44–121)
BUN/Creatinine Ratio: 12 (ref 10–24)
BUN: 16 mg/dL (ref 8–27)
Bilirubin Total: 1 mg/dL (ref 0.0–1.2)
CO2: 22 mmol/L (ref 20–29)
Calcium: 9.2 mg/dL (ref 8.6–10.2)
Chloride: 105 mmol/L (ref 96–106)
Creatinine, Ser: 1.38 mg/dL — ABNORMAL HIGH (ref 0.76–1.27)
Globulin, Total: 2.6 g/dL (ref 1.5–4.5)
Glucose: 98 mg/dL (ref 70–99)
Potassium: 4.4 mmol/L (ref 3.5–5.2)
Sodium: 142 mmol/L (ref 134–144)
Total Protein: 6.8 g/dL (ref 6.0–8.5)
eGFR: 53 mL/min/{1.73_m2} — ABNORMAL LOW (ref >59)

## 2023-02-27 LAB — CBC WITH DIFFERENTIAL/PLATELET
Basophils Absolute: 0 10*3/uL (ref 0.0–0.2)
Basos: 1 %
EOS (ABSOLUTE): 0.1 10*3/uL (ref 0.0–0.4)
Eos: 2 %
Hematocrit: 39.7 % (ref 37.5–51.0)
Hemoglobin: 12.1 g/dL — ABNORMAL LOW (ref 13.0–17.7)
Immature Grans (Abs): 0 10*3/uL (ref 0.0–0.1)
Immature Granulocytes: 0 %
Lymphocytes Absolute: 1.3 10*3/uL (ref 0.7–3.1)
Lymphs: 34 %
MCH: 21.3 pg — ABNORMAL LOW (ref 26.6–33.0)
MCHC: 30.5 g/dL — ABNORMAL LOW (ref 31.5–35.7)
MCV: 70 fL — ABNORMAL LOW (ref 79–97)
Monocytes Absolute: 0.4 10*3/uL (ref 0.1–0.9)
Monocytes: 10 %
Neutrophils Absolute: 2.1 10*3/uL (ref 1.4–7.0)
Neutrophils: 53 %
Platelets: 221 10*3/uL (ref 150–450)
RBC: 5.67 x10E6/uL (ref 4.14–5.80)
RDW: 14.8 % (ref 11.6–15.4)
WBC: 3.8 10*3/uL (ref 3.4–10.8)

## 2023-02-27 LAB — HEMOGLOBIN A1C
Est. average glucose Bld gHb Est-mCnc: 131 mg/dL
Hgb A1c MFr Bld: 6.2 % — ABNORMAL HIGH (ref 4.8–5.6)

## 2023-02-27 LAB — B12 AND FOLATE PANEL
Folate: 6.7 ng/mL (ref >3.0)
Vitamin B-12: 707 pg/mL (ref 232–1245)

## 2023-02-27 LAB — MICROALBUMIN / CREATININE URINE RATIO
Creatinine, Urine: 185.3 mg/dL
Microalb/Creat Ratio: 11 mg/g{creat} (ref 0–29)
Microalbumin, Urine: 20.1 ug/mL

## 2023-02-27 LAB — VITAMIN D 25 HYDROXY (VIT D DEFICIENCY, FRACTURES): Vit D, 25-Hydroxy: 23.9 ng/mL — ABNORMAL LOW (ref 30.0–100.0)

## 2023-02-27 LAB — HEPATITIS C ANTIBODY: Hep C Virus Ab: NONREACTIVE

## 2023-02-27 LAB — TSH+FREE T4
Free T4: 1.5 ng/dL (ref 0.82–1.77)
TSH: 1.11 u[IU]/mL (ref 0.450–4.500)

## 2023-02-28 ENCOUNTER — Other Ambulatory Visit: Payer: Self-pay | Admitting: Family Medicine

## 2023-02-28 MED ORDER — ROSUVASTATIN CALCIUM 5 MG PO TABS: 5.0000 mg | ORAL_TABLET | Freq: Every day | ORAL | 3 refills | Status: DC

## 2023-02-28 MED ORDER — VITAMIN D3 25 MCG (1000 UNIT) PO TABS: 1000.0000 [IU] | ORAL_TABLET | Freq: Every day | ORAL | 1 refills | Status: DC

## 2023-02-28 NOTE — Progress Notes (Signed)
Please inform patient, and would like results to be mailed to them.  Hemoglobin A1c 6.2 indicates prediabetes - no medication intervention just lifestyle changes. Next visit we will check labs again to monitor for type 2 diabetes.   It is important to follow a DASH diet which includes vegetables,fruits,whole grains, fat free or low fat diary,fish,poultry,beans,nuts and seeds,vegetable oils. Find an activity that you will enjoyandstart to be active at least 5 days a week for 30 minutes each day.    Your eGFR 53 levels indicates moderate loss of kidney function, I advise to keep your hypertension controlled, maintain blood pressure reading goals under 130/80, take your daily blood pressure medications. Managing hemoglobin A1c goal less than 5.7 Keep your cholesterol under control to prevent further damage to blood vessels. Avoid NSAIDs medications and take tylenol for pain management.Consume a kidney friendly diet which includes Veggies: cauliflower, onions, eggplant, turnips. Low sodium, low to moderate intake of proteins: lean meats (poultry, fish), eggs, unsalted seafood. Avoid fatty foods, limit or avoid smoking and alcohol intake. Follow up in 4 months to recheck labs.     CBC panel slightly low, may indicate iron deficiency anemia. I advise to consume iron rich foods such as Red meat, pork, poultry, seafood, beans, dark green leafy vegetables, such as spinach, dried fruit, such as raisins and apricots. Iron-fortified cereals, and peas. I encourage to take over the counter iron tablets 65 mg once daily.     Cholesterol levels elevated, follow diet low in saturated fat, reduce dietary salt intake, avoid fatty foods- Crestor 5 mg sent to pharmacy to be taken daily.   Vitamin D levels low, I advise to taking  over the counter supplements of vitamin D [phone] IU/day to prevent low vitamin D levels. Consuming Vitamin D rich food sources include fish, salmon, sardines, egg yolks, red meat, liver,  oranges, soy milk.

## 2023-03-02 ENCOUNTER — Encounter (HOSPITAL_COMMUNITY): Payer: Self-pay

## 2023-03-02 ENCOUNTER — Ambulatory Visit (HOSPITAL_COMMUNITY): Payer: Medicare PPO

## 2023-03-02 ENCOUNTER — Telehealth: Payer: Self-pay | Admitting: Family Medicine

## 2023-03-02 DIAGNOSIS — M5416 Radiculopathy, lumbar region: Secondary | ICD-10-CM

## 2023-03-02 DIAGNOSIS — M6281 Muscle weakness (generalized): Secondary | ICD-10-CM

## 2023-03-02 NOTE — Telephone Encounter (Signed)
Patient called about lab results call back # [phone].

## 2023-03-02 NOTE — Therapy (Signed)
OUTPATIENT PHYSICAL THERAPY THORACOLUMBAR Treatment    Patient Name: Joseph [name] MRN: [id number] DOB:03-12-1946, 77 y.o., male Today's Date: 03/02/2023  END OF SESSION: END OF SESSION:   PT End of Session - 03/02/23 1303     Visit Number 7    Number of Visits 12    Date for PT Re-Evaluation 03/18/23    Authorization Type humana    Authorization Time Period 7/25-9/5 12 visits    Authorization - Number of Visits 7    Progress Note Due on Visit 12    PT Start Time 1304    PT Stop Time 1345    PT Time Calculation (min) 41 min    Activity Tolerance Patient tolerated treatment well    Behavior During Therapy [facility] for tasks assessed/performed             Past Medical History:  Diagnosis Date   1st degree AV block    Atrial fibrillation (HCC)    Diastolic dysfunction 4/13   grade 1   Hypertension    New onset atrial fibrillation (HCC) 06/2015   RBBB    Sinus bradycardia    Stroke (HCC)    no defecits, pt denies having a stroke, says Dr Tresa Endo diagnosed this   Past Surgical History:  Procedure Laterality Date   BIV PACEMAKER INSERTION CRT-P N/A 01/26/2022   Procedure: BIV PACEMAKER INSERTION CRT-P;  Surgeon: Marinus Maw, MD;  [facility];  Service: Cardiovascular;  Laterality: N/A;   COLONOSCOPY     COLONOSCOPY  12/30/2011   Procedure: COLONOSCOPY;  Surgeon: Corbin Ade, MD;  [facility];  Service: Endoscopy;  Laterality: N/A;  1:45PM   COLONOSCOPY N/A 04/19/2019   Procedure: COLONOSCOPY;  Surgeon: Corbin Ade, MD;  [facility];  Service: Endoscopy;  Laterality: N/A;  2:00pm   EXCISION MASS LOWER EXTREMETIES Left 05/12/2018   Procedure: Left foot plantar fibroma excision;  Surgeon: Toni Arthurs, MD;  Location: [facility];  Service: Orthopedics;  Laterality: Left;   FOOT SURGERY Left    LEFT HEART CATH AND CORONARY ANGIOGRAPHY N/A 01/26/2022   Procedure: LEFT HEART CATH AND CORONARY ANGIOGRAPHY;  Surgeon:  Lennette Bihari, MD;  [facility]: [facility];  Service: Cardiovascular;  Laterality: N/A;   SPERMATOCELECTOMY Right 11/13/2019   Procedure: EXCISION OF SPERMATIC CORD CYST;  Surgeon: Malen Gauze, MD;  Location: [facility];  Service: Urology;  Laterality: Right;   VASECTOMY  11/13/2019   Procedure: VASECTOMY;  Surgeon: Malen Gauze, MD;  Location: [facility];  Service: Urology;;   Patient Active Problem List   Diagnosis Date Noted   AKI (acute kidney injury) (HCC) 02/03/2022   NICM (nonischemic cardiomyopathy) (HCC) 02/03/2022   Sinus node dysfunction (HCC)    Atrial flutter (HCC) 01/23/2022   OAB (overactive bladder) 12/27/2020   Nocturia 01/10/2020   Benign prostatic hyperplasia with urinary obstruction 01/10/2020   Non-recurrent unilateral inguinal hernia without obstruction or gangrene    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   History of adenomatous polyp of colon 02/27/2019   Paroxysmal atrial fibrillation (HCC) 07/18/2015   Bradycardia 06/29/2015   Near syncope    Atrial fibrillation with rapid ventricular response (HCC) 06/27/2015   Left ventricular diastolic dysfunction, NYHA class 3 10/26/2014   Kidney cysts 04/04/2013   HTN (hypertension) 03/31/2013    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 03/31/2013   Diastolic dysfunction- grade 2- echo 06/28/15 03/31/2013   RBBB 03/31/2013  AV block, 1st degree 03/31/2013   Sinus bradycardia 03/31/2013   Hematochezia 12/29/2011    PCP: Gareth Morgan  REFERRING PROVIDER: Gareth Morgan  REFERRING DIAG: Low back pain   Rationale for Evaluation and Treatment: Rehabilitation  THERAPY DIAG:  Lumbar radiculopathy Muscle weakness   ONSET DATE: 07/27/22                                                                                                                                                                                             SUBJECIVE STATEMENT:  Pt reports he is sore today, has been doing some of the exercises at home.     PERTINENT HISTORY:  CVA, AKI  PAIN:  Are you having pain? Yes: NPRS scale: 7/10; worst is an 8.   Pain location: buttock  Pain description: aggravation  Aggravating factors: mornings  Relieving factors: walk around  PRECAUTIONS: None     WEIGHT BEARING RESTRICTIONS: No  FALLS:  Has patient fallen in last 6 months? No  LIVING ENVIRONMENT: Lives with: lives with their family Lives in: House/apartment Stairs: Yes: Internal: 12 steps; on right going up and External: 4 steps; on right going up; stairs does not bother the buttock pain  Has following equipment at home: None  OCCUPATION: retired  PLOF: Independent  PATIENT GOALS: less pain   OBJECTIVE:   PATIENT SURVEYS:  Difficult as pt has slight dementia, wife assists with questions.    COGNITION: Overall cognitive status: Within functional limits for tasks assessed       MUSCLE LENGTH: Hamstrings: Right 155 deg; Left 155 deg   POSTURE: No Significant postural limitations    LUMBAR ROM:   AROM eval  Flexion Fingers to toes   Extension 20  Right lateral flexion   Left lateral flexion   Right rotation   Left rotation    (Blank rows = not tested)  LOWER EXTREMITY ROM:   WFL    LOWER EXTREMITY MMT:    MMT Right eval Left eval  Hip flexion 4+ 5  Hip extension 3 3  Hip abduction 3+ 4+  Hip adduction    Hip internal rotation    Hip external rotation    Knee flexion 4- 5  Knee extension 5 5  Ankle dorsiflexion 5 5  Ankle plantarflexion    Ankle inversion    Ankle eversion     (Blank rows = not tested)  FUNCTIONAL TESTS:  30 seconds chair stand test:   6 , ( 8 is poor for pt age and sex, 59 is below average) 2 minute walk test:  339 ft pt begins to drag his  Rt foot after a minute.   Single leg stance: RT: 6"  , LT 6"    TODAY'S TREATMENT:                                                                                                                              DATE:   03/02/23: Prone: Pressup 5x 10" Hip extension 10x 3" holds Seated STS 10 eccentric control Piriformis stretch 2x 30" Standing: Lumbar extension Squat 10x Heel raise 10x SLS Rt 6", Lt 7" Tandem stance 2x 30" Sidestep down long [email or web address]   02/26/23: Standing: Weight shifting with UE overhead 5x Rotate 5x Controlled STS no HHA 5x  2 sets Lumbar extension 5x Prone: POE x 2 minutes Press ups 5x Hip extension 10x 3" holds Child's pose 2x 30" Side lying : Hip abduction x 10 with 3 #   Supine: DKTC 2x 1' hold Seated: Piriformis stretch 3x 30"  02/23/23 Prone: POE x 2 minutes Press ups 5x Hip extension 10x 3" holds Side lying : Hip abduction x 10 with 3 #   Supine: DKTC with feet on green theraball 10x 5-10" Double knee to chest x 2 hold one minute  Piriformis stretch 3x 1 min Hamstring 2x 1 min  STS 10 eccentric control  02/19/2023 Prone: Prone x 1 minute POE x 2 minute Press up x 5 B UE extension x 10 Glut set x 10 Single leg raise x 10  Attempted child pose too much knee pain Supine: Double knee to chest x 3 hold one minute  Piriformis stretch x 3 Bridge x 15 with hip adduction  Hamstring stretch 1:00 x 2 Side lying : Hip abduction x 10 with 3 #     02/17/23 Sitting : Piriformis stretch 3 x 30"  Sit to stand x 15  Supine: Knee to chest 3 x 30"  Trunk rotation  Hamstring stretch 3 x 30"  Bridge x 15  Prone : POE Press up x 5 Single leg raise x 10 Standing: Heel raise x 10  Squat x 10   02/09/23 Supine: Bridge x 10 Trunk rotation x 10  Knee to chest x 3 for 30" Side lying : Hip abduction x 10 both rt and lt Prone: POE  Sitting: Sit to stand x 10  Ankle dorsi/plantar x 10  Nustep level 2 x 5 minutes    02/04/24:  Evaluation   Bridge x 10 Knee to chest 3 x 30" Active hamstring stretch 3 x 30"  PATIENT EDUCATION:  Education details: HEP Person educated: Patient Education method: Chief Technology Officer Education  comprehension: verbalized understanding  HOME EXERCISE PROGRAM: Access Code: [facility] URL: [email or web address] 02/26/23: childs pose  02/17/23- sitting piriformis stretch x 30"  3x each  7/30/2- Supine Lower Trunk Rotation  - 2 x daily - 7 x weekly - 1 sets - 10 reps - 5" hold4  Date: 02/04/2023 Prepared by: Virgina Organ  Exercises - Supine Bridge  -  2 x daily - 7 x weekly - 1 sets - 10 reps - 5" hold - Supine Hamstring Stretch  - 2 x daily - 7 x weekly - 1 sets - 3 reps - 30" hold - Supine Single Knee to Chest Stretch  - 2 x daily - 7 x weekly - 1 sets - 3 reps - 30" hold  ASSESSMENT:  CLINICAL IMPRESSION:  Session focus with lumbar mobility and proximal strengthening.  Added balance activities this session for hip stability with min guard and UE support required.  Pt required multimodal cueing through session for proper form and technique.  No reports of increased pain, was limited by fatigue.  OBJECTIVE IMPAIRMENTS: decreased activity tolerance, decreased balance, decreased ROM, decreased strength, and pain.   ACTIVITY LIMITATIONS: carrying, lifting, standing, and locomotion level  PARTICIPATION LIMITATIONS: shopping and community activity  PERSONAL FACTORS: Time since onset of injury/illness/exacerbation and 1-2 comorbidities: AKI, CVA  are also affecting patient's functional outcome.   REHAB POTENTIAL: Good  CLINICAL DECISION MAKING: Stable/uncomplicated  EVALUATION COMPLEXITY: Low   GOALS: Goals reviewed with patient? No  SHORT TERM GOALS: Target date: 02/25/23  PT to be I in Hep in order to decrease his Rt buttock pain to a 5/10 Baseline: Goal status: On-going  2.  PT to be able to single leg stance on each foot for 15" to reduce risk of falling  Baseline:  Goal status:  On-going  3.  Pt core and LE strength to be increased to allow pt to be able to come sit to stand 9 x in 30" Baseline:  Goal status:  On-going    LONG TERM GOALS: Target  date: 03/18/23  PT to be I in Hep in order to decrease his Rt buttock pain to a 2/10  Goal status:  On-going   2.   PT to be able to single leg stance on each foot for 30" to reduce risk of falling  Baseline:  Goal status:  On-going  3.   Pt core and LE strength to be increased to allow pt to be able to come sit to stand 11 x in 30" Baseline:  Goal status:  On-going   PLAN:  PT FREQUENCY: 2x/week  PT DURATION: 6 weeks  PLANNED INTERVENTIONS: Therapeutic exercises, Neuromuscular re-education, Balance training, Patient/Family education, Self Care, and Manual therapy.  PLAN FOR NEXT SESSION: Continue Core and LE strength, add piriformis stretches, progress balance   Becky Sax, LPTA/CLT; CBIS [phone]  Juel Burrow, PTA 03/02/2023, 2:45 PM  03/02/2023, 2:45 PM

## 2023-03-03 NOTE — Telephone Encounter (Signed)
[facility]ed in [facility] . Wants a call back

## 2023-03-04 NOTE — Telephone Encounter (Signed)
[email or web address]

## 2023-03-05 ENCOUNTER — Ambulatory Visit (HOSPITAL_COMMUNITY): Payer: Medicare PPO | Admitting: Physical Therapy

## 2023-03-05 DIAGNOSIS — M5416 Radiculopathy, lumbar region: Secondary | ICD-10-CM

## 2023-03-05 DIAGNOSIS — M6281 Muscle weakness (generalized): Secondary | ICD-10-CM

## 2023-03-05 NOTE — Therapy (Signed)
OUTPATIENT PHYSICAL THERAPY THORACOLUMBAR Treatment    Patient Name: Joseph TRAYER MRN: [id number] DOB:Mar 16, 1946, 77 y.o., male Today's Date: 03/05/2023  END OF SESSION: END OF SESSION:   PT End of Session - 03/05/23 1200     Visit Number 8    Number of Visits 12    Date for PT Re-Evaluation 03/18/23    Authorization Type humana    Authorization Time Period 7/25-9/5 12 visits    Authorization - Number of Visits 8    Progress Note Due on Visit 12    PT Start Time 1119    PT Stop Time 1200    PT Time Calculation (min) 41 min    Activity Tolerance Patient tolerated treatment well    Behavior During Therapy [facility] for tasks assessed/performed             Past Medical History:  Diagnosis Date   1st degree AV block    Atrial fibrillation (HCC)    Diastolic dysfunction 4/13   grade 1   Hypertension    New onset atrial fibrillation (HCC) 06/2015   RBBB    Sinus bradycardia    Stroke (HCC)    no defecits, pt denies having a stroke, says Dr Tresa Endo diagnosed this   Past Surgical History:  Procedure Laterality Date   BIV PACEMAKER INSERTION CRT-P N/A 01/26/2022   Procedure: BIV PACEMAKER INSERTION CRT-P;  Surgeon: Marinus Maw, MD;  [facility];  Service: Cardiovascular;  Laterality: N/A;   COLONOSCOPY     COLONOSCOPY  12/30/2011   Procedure: COLONOSCOPY;  Surgeon: Corbin Ade, MD;  [facility];  Service: Endoscopy;  Laterality: N/A;  1:45PM   COLONOSCOPY N/A 04/19/2019   Procedure: COLONOSCOPY;  Surgeon: Corbin Ade, MD;  [facility];  Service: Endoscopy;  Laterality: N/A;  2:00pm   EXCISION MASS LOWER EXTREMETIES Left 05/12/2018   Procedure: Left foot plantar fibroma excision;  Surgeon: Toni Arthurs, MD;  Location: [facility];  Service: Orthopedics;  Laterality: Left;   FOOT SURGERY Left    LEFT HEART CATH AND CORONARY ANGIOGRAPHY N/A 01/26/2022   Procedure: LEFT HEART CATH AND CORONARY ANGIOGRAPHY;  Surgeon:  Lennette Bihari, MD;  [facility];  Service: Cardiovascular;  Laterality: N/A;   SPERMATOCELECTOMY Right 11/13/2019   Procedure: EXCISION OF SPERMATIC CORD CYST;  Surgeon: Malen Gauze, MD;  Location: [facility];  Service: Urology;  Laterality: Right;   VASECTOMY  11/13/2019   Procedure: VASECTOMY;  Surgeon: Malen Gauze, MD;  Location: [facility];  Service: Urology;;   Patient Active Problem List   Diagnosis Date Noted   AKI (acute kidney injury) (HCC) 02/03/2022   NICM (nonischemic cardiomyopathy) (HCC) 02/03/2022   Sinus node dysfunction (HCC)    Atrial flutter (HCC) 01/23/2022   OAB (overactive bladder) 12/27/2020   Nocturia 01/10/2020   Benign prostatic hyperplasia with urinary obstruction 01/10/2020   Non-recurrent unilateral inguinal hernia without obstruction or gangrene    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   History of adenomatous polyp of colon 02/27/2019   Paroxysmal atrial fibrillation (HCC) 07/18/2015   Bradycardia 06/29/2015   Near syncope    Atrial fibrillation with rapid ventricular response (HCC) 06/27/2015   Left ventricular diastolic dysfunction, NYHA class 3 10/26/2014   Kidney cysts 04/04/2013   HTN (hypertension) 03/31/2013    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 03/31/2013   Diastolic dysfunction- grade 2- echo 06/28/15 03/31/2013   RBBB 03/31/2013  AV block, 1st degree 03/31/2013   Sinus bradycardia 03/31/2013   Hematochezia 12/29/2011    PCP: Gareth Morgan  REFERRING PROVIDER: Gareth Morgan  REFERRING DIAG: Low back pain   Rationale for Evaluation and Treatment: Rehabilitation  THERAPY DIAG:  Lumbar radiculopathy Muscle weakness   ONSET DATE: 07/27/22                                                                                                                                                                                             SUBJECIVE STATEMENT:  Pt states he is sore, feels like he has been wrestling an alligator.   PERTINENT  HISTORY:  CVA, AKI  PAIN:  Are you having pain? Yes: NPRS scale: 2/10; worst is an 8.   Pain location: buttock  Pain description: aggravation  Aggravating factors: mornings  Relieving factors: walk around  PRECAUTIONS: None     WEIGHT BEARING RESTRICTIONS: No  FALLS:  Has patient fallen in last 6 months? No  LIVING ENVIRONMENT: Lives with: lives with their family Lives in: House/apartment Stairs: Yes: Internal: 12 steps; on right going up and External: 4 steps; on right going up; stairs does not bother the buttock pain  Has following equipment at home: None  OCCUPATION: retired  PLOF: Independent  PATIENT GOALS: less pain   OBJECTIVE:   PATIENT SURVEYS:  Difficult as pt has slight dementia, wife assists with questions.    COGNITION: Overall cognitive status: Within functional limits for tasks assessed       MUSCLE LENGTH: Hamstrings: Right 155 deg; Left 155 deg   POSTURE: No Significant postural limitations    LUMBAR ROM:   AROM eval  Flexion Fingers to toes   Extension 20  Right lateral flexion   Left lateral flexion   Right rotation   Left rotation    (Blank rows = not tested)  LOWER EXTREMITY ROM:   WFL    LOWER EXTREMITY MMT:    MMT Right eval Left eval  Hip flexion 4+ 5  Hip extension 3 3  Hip abduction 3+ 4+  Hip adduction    Hip internal rotation    Hip external rotation    Knee flexion 4- 5  Knee extension 5 5  Ankle dorsiflexion 5 5  Ankle plantarflexion    Ankle inversion    Ankle eversion     (Blank rows = not tested)  FUNCTIONAL TESTS:  30 seconds chair stand test:   6 , ( 8 is poor for pt age and sex, 38 is below average) 2 minute walk test:  339 ft pt begins to drag his Rt foot after  a minute.   Single leg stance: RT: 6"  , LT 6"    TODAY'S TREATMENT:                                                                                                                              DATE:  03/05/23: Standing: Heel  raises x 15 Squat x 10 Sit to stand x 10 Passive hamstring stretch on 12" box 30" x 3 B Hip excursions x 3 Marching x 5  Supine : Bridge x 15 LTR x 10  Figure 4 piriformis stretch   03/02/23: Prone: Pressup 5x 10" Hip extension 10x 3" holds Seated STS 10 eccentric control Piriformis stretch 2x 30" Standing: Lumbar extension Squat 10x Heel raise 10x SLS Rt 6", Lt 7" Tandem stance 2x 30" Sidestep down long [email or web address]   02/26/23: Standing: Weight shifting with UE overhead 5x Rotate 5x Controlled STS no HHA 5x  2 sets Lumbar extension 5x Prone: POE x 2 minutes Press ups 5x Hip extension 10x 3" holds Child's pose 2x 30" Side lying : Hip abduction x 10 with 3 #   Supine: DKTC 2x 1' hold Seated: Piriformis stretch 3x 30"  02/23/23 Prone: POE x 2 minutes Press ups 5x Hip extension 10x 3" holds Side lying : Hip abduction x 10 with 3 #   Supine: DKTC with feet on green theraball 10x 5-10" Double knee to chest x 2 hold one minute  Piriformis stretch 3x 1 min Hamstring 2x 1 min  STS 10 eccentric control  PATIENT EDUCATION:  Education details: HEP Person educated: Patient Education method: Chief Technology Officer Education comprehension: verbalized understanding  HOME EXERCISE PROGRAM: Access Code: [facility] URL: [email or web address] 02/26/23: childs pose  02/17/23- sitting piriformis stretch x 30"  3x each  7/30/2- Supine Lower Trunk Rotation  - 2 x daily - 7 x weekly - 1 sets - 10 reps - 5" hold4  Date: 02/04/2023 Prepared by: Virgina Organ  Exercises - Supine Bridge  - 2 x daily - 7 x weekly - 1 sets - 10 reps - 5" hold - Supine Hamstring Stretch  - 2 x daily - 7 x weekly - 1 sets - 3 reps - 30" hold - Supine Single Knee to Chest Stretch  - 2 x daily - 7 x weekly - 1 sets - 3 reps - 30" hold  ASSESSMENT:  CLINICAL IMPRESSION:  Pt progressing slowly due to dementia and not remembering to complete his exercises.  Pt required  multimodal cueing through session for proper form and technique.  No reports of increased pain, was limited by fatigue.   Pt will continue to benefit from skilled PT to improve core and hip strength and stretching to decrease pain and improve overall function.   OBJECTIVE IMPAIRMENTS: decreased activity tolerance, decreased balance, decreased ROM, decreased strength, and pain.   ACTIVITY LIMITATIONS: carrying, lifting, standing, and locomotion level  PARTICIPATION LIMITATIONS: shopping and community activity  PERSONAL FACTORS: Time  since onset of injury/illness/exacerbation and 1-2 comorbidities: AKI, CVA  are also affecting patient's functional outcome.   REHAB POTENTIAL: Good  CLINICAL DECISION MAKING: Stable/uncomplicated  EVALUATION COMPLEXITY: Low   GOALS: Goals reviewed with patient? No  SHORT TERM GOALS: Target date: 02/25/23  PT to be I in Hep in order to decrease his Rt buttock pain to a 5/10 Baseline: Goal status: On-going  2.  PT to be able to single leg stance on each foot for 15" to reduce risk of falling  Baseline:  Goal status:  On-going  3.  Pt core and LE strength to be increased to allow pt to be able to come sit to stand 9 x in 30" Baseline:  Goal status:  On-going    LONG TERM GOALS: Target date: 03/18/23  PT to be I in Hep in order to decrease his Rt buttock pain to a 2/10  Goal status:  On-going   2.   PT to be able to single leg stance on each foot for 30" to reduce risk of falling  Baseline:  Goal status:  On-going  3.   Pt core and LE strength to be increased to allow pt to be able to come sit to stand 11 x in 30" Baseline:  Goal status:  On-going   PLAN:  PT FREQUENCY: 2x/week  PT DURATION: 6 weeks  PLANNED INTERVENTIONS: Therapeutic exercises, Neuromuscular re-education, Balance training, Patient/Family education, Self Care, and Manual therapy.  PLAN FOR NEXT SESSION: Continue Core and LE strength, add piriformis stretches, progress  balance    Virgina Organ, PT CLT [phone]  03/05/2023, 12:00 PM

## 2023-03-08 ENCOUNTER — Encounter (HOSPITAL_COMMUNITY): Payer: Medicare PPO | Admitting: Physical Therapy

## 2023-03-08 ENCOUNTER — Telehealth (HOSPITAL_COMMUNITY): Payer: Self-pay | Admitting: Physical Therapy

## 2023-03-08 NOTE — Telephone Encounter (Signed)
First no show:  Called pt's wife who states she was helping her daughter with the kids getting off the bus and totally forgot this appointment.  Pt will be here on Thursday.  Virgina Organ, PT CLT [phone]

## 2023-03-10 ENCOUNTER — Other Ambulatory Visit (HOSPITAL_COMMUNITY): Payer: Self-pay | Admitting: Nephrology

## 2023-03-10 DIAGNOSIS — I5022 Chronic systolic (congestive) heart failure: Secondary | ICD-10-CM

## 2023-03-10 DIAGNOSIS — N1831 Chronic kidney disease, stage 3a: Secondary | ICD-10-CM

## 2023-03-10 DIAGNOSIS — I129 Hypertensive chronic kidney disease with stage 1 through stage 4 chronic kidney disease, or unspecified chronic kidney disease: Secondary | ICD-10-CM

## 2023-03-11 ENCOUNTER — Ambulatory Visit (HOSPITAL_COMMUNITY): Payer: Medicare PPO

## 2023-03-11 DIAGNOSIS — M6281 Muscle weakness (generalized): Secondary | ICD-10-CM

## 2023-03-11 DIAGNOSIS — M5416 Radiculopathy, lumbar region: Secondary | ICD-10-CM

## 2023-03-11 NOTE — Therapy (Addendum)
OUTPATIENT PHYSICAL THERAPY THORACOLUMBAR Treatment    Patient Name: Joseph [name] MRN: [id number] DOB:04-13-1946, 77 y.o., male Today's Date: 03/11/2023  END OF SESSION:   PT End of Session - 03/11/23 1319     Visit Number 9    Number of Visits 12    Date for PT Re-Evaluation 03/18/23    Authorization Type humana    Authorization Time Period 7/25-9/5 12 visits    Authorization - Number of Visits 9    Progress Note Due on Visit 12    PT Start Time 1300    PT Stop Time 1340    PT Time Calculation (min) 40 min    Activity Tolerance Patient tolerated treatment well    Behavior During Therapy [facility] for tasks assessed/performed              Past Medical History:  Diagnosis Date   1st degree AV block    Atrial fibrillation (HCC)    Diastolic dysfunction 4/13   grade 1   Hypertension    New onset atrial fibrillation (HCC) 06/2015   RBBB    Sinus bradycardia    Stroke (HCC)    no defecits, pt denies having a stroke, says Dr Tresa Endo diagnosed this   Past Surgical History:  Procedure Laterality Date   BIV PACEMAKER INSERTION CRT-P N/A 01/26/2022   Procedure: BIV PACEMAKER INSERTION CRT-P;  Surgeon: Marinus Maw, MD;  [facility];  Service: Cardiovascular;  Laterality: N/A;   COLONOSCOPY     COLONOSCOPY  12/30/2011   Procedure: COLONOSCOPY;  Surgeon: Corbin Ade, MD;  [facility];  Service: Endoscopy;  Laterality: N/A;  1:45PM   COLONOSCOPY N/A 04/19/2019   Procedure: COLONOSCOPY;  Surgeon: Corbin Ade, MD;  [facility];  Service: Endoscopy;  Laterality: N/A;  2:00pm   EXCISION MASS LOWER EXTREMETIES Left 05/12/2018   Procedure: Left foot plantar fibroma excision;  Surgeon: Toni Arthurs, MD;  [facility];  Service: Orthopedics;  Laterality: Left;   FOOT SURGERY Left    LEFT HEART CATH AND CORONARY ANGIOGRAPHY N/A 01/26/2022   Procedure: LEFT HEART CATH AND CORONARY ANGIOGRAPHY;  Surgeon: Lennette Bihari,  MD;  [facility];  Service: Cardiovascular;  Laterality: N/A;   SPERMATOCELECTOMY Right 11/13/2019   Procedure: EXCISION OF SPERMATIC CORD CYST;  Surgeon: Malen Gauze, MD;  Location: [facility];  Service: Urology;  Laterality: Right;   VASECTOMY  11/13/2019   Procedure: VASECTOMY;  Surgeon: Malen Gauze, MD;  Location: [facility];  Service: Urology;;   Patient Active Problem List   Diagnosis Date Noted   AKI (acute kidney injury) (HCC) 02/03/2022   NICM (nonischemic cardiomyopathy) (HCC) 02/03/2022   Sinus node dysfunction (HCC)    Atrial flutter (HCC) 01/23/2022   OAB (overactive bladder) 12/27/2020   Nocturia 01/10/2020   Benign prostatic hyperplasia with urinary obstruction 01/10/2020   Non-recurrent unilateral inguinal hernia without obstruction or gangrene    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   History of adenomatous polyp of colon 02/27/2019   Paroxysmal atrial fibrillation (HCC) 07/18/2015   Bradycardia 06/29/2015   Near syncope    Atrial fibrillation with rapid ventricular response (HCC) 06/27/2015   Left ventricular diastolic dysfunction, NYHA class 3 10/26/2014   Kidney cysts 04/04/2013   HTN (hypertension) 03/31/2013    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 03/31/2013   Diastolic dysfunction- grade 2- echo 06/28/15 03/31/2013   RBBB 03/31/2013   AV block,  1st degree 03/31/2013   Sinus bradycardia 03/31/2013   Hematochezia 12/29/2011    PCP: Gareth Morgan  REFERRING PROVIDER: Gareth Morgan  REFERRING DIAG: Low back pain   Rationale for Evaluation and Treatment: Rehabilitation  THERAPY DIAG:  Lumbar radiculopathy Muscle weakness   ONSET DATE: 07/27/22                                                                                                                                                                                             SUBJECIVE STATEMENT:  Patient states that his back pain is mostly localized on his B buttocks (7/10) and is worst in the  AM.   PERTINENT HISTORY:  CVA, AKI  PAIN:  Are you having pain? Yes: NPRS scale: 2/10; worst is an 8.   Pain location: buttock  Pain description: aggravation  Aggravating factors: mornings  Relieving factors: walk around  PRECAUTIONS: None     WEIGHT BEARING RESTRICTIONS: No  FALLS:  Has patient fallen in last 6 months? No  LIVING ENVIRONMENT: Lives with: lives with their family Lives in: House/apartment Stairs: Yes: Internal: 12 steps; on right going up and External: 4 steps; on right going up; stairs does not bother the buttock pain  Has following equipment at home: None  OCCUPATION: retired  PLOF: Independent  PATIENT GOALS: less pain   OBJECTIVE:   PATIENT SURVEYS:  Difficult as pt has slight dementia, wife assists with questions.    COGNITION: Overall cognitive status: Within functional limits for tasks assessed       MUSCLE LENGTH: Hamstrings: Right 155 deg; Left 155 deg   POSTURE: No Significant postural limitations    LUMBAR ROM:   AROM eval  Flexion Fingers to toes   Extension 20  Right lateral flexion   Left lateral flexion   Right rotation   Left rotation    (Blank rows = not tested)  LOWER EXTREMITY ROM:   WFL    LOWER EXTREMITY MMT:    MMT Right eval Left eval  Hip flexion 4+ 5  Hip extension 3 3  Hip abduction 3+ 4+  Hip adduction    Hip internal rotation    Hip external rotation    Knee flexion 4- 5  Knee extension 5 5  Ankle dorsiflexion 5 5  Ankle plantarflexion    Ankle inversion    Ankle eversion     (Blank rows = not tested)  FUNCTIONAL TESTS:  30 seconds chair stand test:   6 , ( 8 is poor for pt age and sex, 10 is below average) 2 minute walk test:  339 ft pt begins to  drag his Rt foot after a minute.   Single leg stance: RT: 6"  , LT 6"    TODAY'S TREATMENT:                                                                                                                              DATE:   03/11/23: Prone:  POE x 5" x 10 Supine:  Passive sciatic nerve glide x 4' on B Seated  piriformis stretch x 30" x 3 Hamstring stretch x 30" x 3 Standing:  Lumbar ext x 10 x 2  Abdominal isometrics x 3" x 10  03/05/23: Standing: Heel raises x 15 Squat x 10 Sit to stand x 10 Passive hamstring stretch on 12" box 30" x 3 B Hip excursions x 3 Marching x 5  Supine : Bridge x 15 LTR x 10  Figure 4 piriformis stretch   03/02/23: Prone: Pressup 5x 10" Hip extension 10x 3" holds Seated STS 10 eccentric control Piriformis stretch 2x 30" Standing: Lumbar extension Squat 10x Heel raise 10x SLS Rt 6", Lt 7" Tandem stance 2x 30" Sidestep down long [email or web address]   02/26/23: Standing: Weight shifting with UE overhead 5x Rotate 5x Controlled STS no HHA 5x  2 sets Lumbar extension 5x Prone: POE x 2 minutes Press ups 5x Hip extension 10x 3" holds Child's pose 2x 30" Side lying : Hip abduction x 10 with 3 #   Supine: DKTC 2x 1' hold Seated: Piriformis stretch 3x 30"  02/23/23 Prone: POE x 2 minutes Press ups 5x Hip extension 10x 3" holds Side lying : Hip abduction x 10 with 3 #   Supine: DKTC with feet on green theraball 10x 5-10" Double knee to chest x 2 hold one minute  Piriformis stretch 3x 1 min Hamstring 2x 1 min  STS 10 eccentric control  PATIENT EDUCATION:  Education details: HEP Person educated: Patient Education method: Chief Technology Officer Education comprehension: verbalized understanding  HOME EXERCISE PROGRAM: Access Code: [facility] URL: [email or web address] 03/11/23 (removed child's pose and knee to chest) - Seated Piriformis Stretch  - 2 x daily - 7 x weekly - 1 sets - 3 reps - 30" hold - Prone Press Up On Elbows  - 1-2 x daily - 7 x weekly - 1 sets - 10 reps - 5 hold - Standing Lumbar Extension  - 1-2 x daily - 7 x weekly - 2 sets - 10 reps  02/26/23: childs pose  02/17/23- sitting piriformis stretch x 30"  3x each  7/30/2-  Supine Lower Trunk Rotation  - 2 x daily - 7 x weekly - 1 sets - 10 reps - 5" hold4  Date: 02/04/2023 Prepared by: Virgina Organ  Exercises - Supine Bridge  - 2 x daily - 7 x weekly - 1 sets - 10 reps - 5" hold - Supine Hamstring Stretch  - 2 x daily - 7 x weekly - 1 sets - 3 reps - 30" hold -  Supine Single Knee to Chest Stretch  - 2 x daily - 7 x weekly - 1 sets - 3 reps - 30" hold  ASSESSMENT:  CLINICAL IMPRESSION:  Interventions today were geared towards centralization of pain, core strength and LE flexibility. Attempted to perform supine piriformis stretch but patient was unable to feel the stretch so the exercise was changed to sitting. Tolerated all activities without worsening of symptoms. Lumbar ext and prone on elbows centralized the pain. Demonstrated appropriate levels of fatigue. Pacing of activities was slow. Provided mod amount of multimodal cueing to ensure correct execution of activity with fair carry-over. Patient reports that he was woozy at the end of the session. BP = 188/86 mmHg. Patient then felt better after a few minutes of rest and the wooziness subsided. To date, skilled PT is required to address the impairments and improve function.   OBJECTIVE IMPAIRMENTS: decreased activity tolerance, decreased balance, decreased ROM, decreased strength, and pain.   ACTIVITY LIMITATIONS: carrying, lifting, standing, and locomotion level  PARTICIPATION LIMITATIONS: shopping and community activity  PERSONAL FACTORS: Time since onset of injury/illness/exacerbation and 1-2 comorbidities: AKI, CVA  are also affecting patient's functional outcome.   REHAB POTENTIAL: Good  CLINICAL DECISION MAKING: Stable/uncomplicated  EVALUATION COMPLEXITY: Low   GOALS: Goals reviewed with patient? No  SHORT TERM GOALS: Target date: 02/25/23  PT to be I in Hep in order to decrease his Rt buttock pain to a 5/10 Baseline: Goal status: On-going  2.  PT to be able to single leg stance on  each foot for 15" to reduce risk of falling  Baseline:  Goal status:  On-going  3.  Pt core and LE strength to be increased to allow pt to be able to come sit to stand 9 x in 30" Baseline:  Goal status:  On-going    LONG TERM GOALS: Target date: 03/18/23  PT to be I in Hep in order to decrease his Rt buttock pain to a 2/10  Goal status:  On-going   2.   PT to be able to single leg stance on each foot for 30" to reduce risk of falling  Baseline:  Goal status:  On-going  3.   Pt core and LE strength to be increased to allow pt to be able to come sit to stand 11 x in 30" Baseline:  Goal status:  On-going   PLAN:  PT FREQUENCY: 2x/week  PT DURATION: 6 weeks  PLANNED INTERVENTIONS: Therapeutic exercises, Neuromuscular re-education, Balance training, Patient/Family education, Self Care, and Manual therapy.  PLAN FOR NEXT SESSION: Continue Core and LE strength, add piriformis stretches, progress balance    Iantha Fallen L. Kaydee Magel, PT, DPT, OCS Board-Certified Clinical Specialist in Orthopedic PT PT Compact Privilege # ([place]): [id number] [phone]  03/11/2023, 1:20 PM

## 2023-03-16 ENCOUNTER — Encounter (HOSPITAL_COMMUNITY): Payer: Medicare PPO | Admitting: Physical Therapy

## 2023-03-19 ENCOUNTER — Encounter (HOSPITAL_COMMUNITY): Payer: Self-pay

## 2023-03-19 ENCOUNTER — Ambulatory Visit (HOSPITAL_COMMUNITY): Payer: Medicare PPO | Attending: Family Medicine

## 2023-03-19 DIAGNOSIS — M6281 Muscle weakness (generalized): Secondary | ICD-10-CM | POA: Insufficient documentation

## 2023-03-19 DIAGNOSIS — M5416 Radiculopathy, lumbar region: Secondary | ICD-10-CM | POA: Insufficient documentation

## 2023-03-19 NOTE — Therapy (Signed)
OUTPATIENT PHYSICAL THERAPY THORACOLUMBAR Treatment  Progress Note Reporting Period 02/04/23 to 03/19/23  See note below for Objective Data and Assessment of Progress/Goals.      Patient Name: Joseph [name] MRN: [id number] DOB:1945/10/25, 77 y.o., male Today's Date: 03/19/2023  END OF SESSION:   PT End of Session - 03/19/23 1130     Visit Number 10    Number of Visits 12    Date for PT Re-Evaluation 05/06/23   Authorization Type humana; requested 8 additional exercises on 03/19/23    Authorization Time Period 7/25-9/5 12 visits    Authorization - Number of Visits 10    Progress Note Due on Visit 12    PT Start Time 1132    PT Stop Time 1213    PT Time Calculation (min) 41 min    Activity Tolerance Patient tolerated treatment well    Behavior During Therapy [facility] for tasks assessed/performed              Past Medical History:  Diagnosis Date   1st degree AV block    Atrial fibrillation (HCC)    Diastolic dysfunction 4/13   grade 1   Hypertension    New onset atrial fibrillation (HCC) 06/2015   RBBB    Sinus bradycardia    Stroke (HCC)    no defecits, pt denies having a stroke, says Dr Tresa Endo diagnosed this   Past Surgical History:  Procedure Laterality Date   BIV PACEMAKER INSERTION CRT-P N/A 01/26/2022   Procedure: BIV PACEMAKER INSERTION CRT-P;  Surgeon: Marinus Maw, MD;  [facility];  Service: Cardiovascular;  Laterality: N/A;   COLONOSCOPY     COLONOSCOPY  12/30/2011   Procedure: COLONOSCOPY;  Surgeon: Corbin Ade, MD;  [facility]: [facility];  Service: Endoscopy;  Laterality: N/A;  1:45PM   COLONOSCOPY N/A 04/19/2019   Procedure: COLONOSCOPY;  Surgeon: Corbin Ade, MD;  [facility]: [facility];  Service: Endoscopy;  Laterality: N/A;  2:00pm   EXCISION MASS LOWER EXTREMETIES Left 05/12/2018   Procedure: Left foot plantar fibroma excision;  Surgeon: Toni Arthurs, MD;  Location: [facility];  Service: Orthopedics;   Laterality: Left;   FOOT SURGERY Left    LEFT HEART CATH AND CORONARY ANGIOGRAPHY N/A 01/26/2022   Procedure: LEFT HEART CATH AND CORONARY ANGIOGRAPHY;  Surgeon: Lennette Bihari, MD;  [facility]: [facility];  Service: Cardiovascular;  Laterality: N/A;   SPERMATOCELECTOMY Right 11/13/2019   Procedure: EXCISION OF SPERMATIC CORD CYST;  Surgeon: Malen Gauze, MD;  Location: [facility];  Service: Urology;  Laterality: Right;   VASECTOMY  11/13/2019   Procedure: VASECTOMY;  Surgeon: Malen Gauze, MD;  Location: [facility];  Service: Urology;;   Patient Active Problem List   Diagnosis Date Noted   AKI (acute kidney injury) (HCC) 02/03/2022   NICM (nonischemic cardiomyopathy) (HCC) 02/03/2022   Sinus node dysfunction (HCC)    Atrial flutter (HCC) 01/23/2022   OAB (overactive bladder) 12/27/2020   Nocturia 01/10/2020   Benign prostatic hyperplasia with urinary obstruction 01/10/2020   Non-recurrent unilateral inguinal hernia without obstruction or gangrene    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   History of adenomatous polyp of colon 02/27/2019   Paroxysmal atrial fibrillation (HCC) 07/18/2015   Bradycardia 06/29/2015   Near syncope    Atrial fibrillation with rapid ventricular response (HCC) 06/27/2015   Left ventricular diastolic dysfunction, NYHA class 3 10/26/2014   Kidney cysts 04/04/2013   HTN (hypertension) 03/31/2013  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 03/31/2013   Diastolic dysfunction- grade 2- echo 06/28/15 03/31/2013   RBBB 03/31/2013   AV block, 1st degree 03/31/2013   Sinus bradycardia 03/31/2013   Hematochezia 12/29/2011    PCP: Gareth Morgan  REFERRING PROVIDER: Gareth Morgan  REFERRING DIAG: Low back pain   Rationale for Evaluation and Treatment: Rehabilitation  THERAPY DIAG:  Lumbar radiculopathy Muscle weakness   ONSET DATE: 07/27/22                                                                                                                                                                                              SUBJECIVE STATEMENT:  Pt stated he continues to have pain in the morning that resolves throughout the day.  Pain scale was 5/10 this morning.  Feels he has improved by 20%  PERTINENT HISTORY:  CVA, AKI  PAIN:  Are you having pain? Yes: NPRS scale: 0/10; worst is an 5-6/10 Pain location: buttock  Pain description: aggravation  Aggravating factors: mornings  Relieving factors: walk around  PRECAUTIONS: None     WEIGHT BEARING RESTRICTIONS: No  FALLS:  Has patient fallen in last 6 months? No  LIVING ENVIRONMENT: Lives with: lives with their family Lives in: House/apartment Stairs: Yes: Internal: 12 steps; on right going up and External: 4 steps; on right going up; stairs does not bother the buttock pain  Has following equipment at home: None  OCCUPATION: retired  PLOF: Independent  PATIENT GOALS: less pain   OBJECTIVE:   PATIENT SURVEYS:  Difficult as pt has slight dementia, wife assists with questions.    COGNITION: Overall cognitive status: Within functional limits for tasks assessed       MUSCLE LENGTH: Hamstrings: Right 155 deg; Left 155 deg   POSTURE: No Significant postural limitations    LUMBAR ROM:   AROM eval  Flexion Fingers to toes   Extension 20  Right lateral flexion   Left lateral flexion   Right rotation   Left rotation    (Blank rows = not tested)  LOWER EXTREMITY ROM:   [facility]    LOWER EXTREMITY MMT:    MMT Right eval Left eval Right 03/19/23 Left  03/19/23  Hip flexion 4+ 5 5 5   Hip extension 3 3 3+ 3  Hip abduction 3+ 4+ 4- 4+  Hip adduction      Hip internal rotation      Hip external rotation      Knee flexion 4- 5 5 5   Knee extension 5 5    Ankle dorsiflexion 5 5    Ankle plantarflexion  Ankle inversion      Ankle eversion       (Blank rows = not tested)  FUNCTIONAL TESTS: Eval 30 seconds chair stand test:   6 , ( 8 is poor for pt age and sex, 69 is  below average) 2 minute walk test:  339 ft pt begins to drag his Rt foot after a minute.   Single leg stance: RT: 6"  , LT 6"   FUNCTIONAL TESTS: 03/19/23: 30 seconds chair stand test:   9 STS in 30" no HHA ( 8 is poor for pt age and sex, 59 is below average) 2 minute walk test:  352ft begins to drag Rt foot after a 75" Single leg stance: SLS Rt 14", Lt 9"   TODAY'S TREATMENT:                                                                                                                              DATE:  03/19/23: 368ft no AD 30 seconds chair stand test:   9 STS in 30" no HHA MMT see above Reviewed goals  Seated: Controlled STS no HHA Supine: Bridge 10x + Prone: Hip extension 10x Standing: SLS Rt 14", Lt 9" Tandem stance 2x 30" each Sidestep inside //bars with minimal HHA 3RT (cueing to reduce ER)   03/11/23: Prone:  POE x 5" x 10 Supine:  Passive sciatic nerve glide x 4' on B Seated  piriformis stretch x 30" x 3 Hamstring stretch x 30" x 3 Standing:  Lumbar ext x 10 x 2  Abdominal isometrics x 3" x 10  03/05/23: Standing: Heel raises x 15 Squat x 10 Sit to stand x 10 Passive hamstring stretch on 12" box 30" x 3 B Hip excursions x 3 Marching x 5  Supine : Bridge x 15 LTR x 10  Figure 4 piriformis stretch   03/02/23: Prone: Pressup 5x 10" Hip extension 10x 3" holds Seated STS 10 eccentric control Piriformis stretch 2x 30" Standing: Lumbar extension Squat 10x Heel raise 10x SLS Rt 6", Lt 7" Tandem stance 2x 30" Sidestep down long hallway1RT   02/26/23: Standing: Weight shifting with UE overhead 5x Rotate 5x Controlled STS no HHA 5x  2 sets Lumbar extension 5x Prone: POE x 2 minutes Press ups 5x Hip extension 10x 3" holds Child's pose 2x 30" Side lying : Hip abduction x 10 with 3 #   Supine: DKTC 2x 1' hold Seated: Piriformis stretch 3x 30"  02/23/23 Prone: POE x 2 minutes Press ups 5x Hip extension 10x 3" holds Side lying  : Hip abduction x 10 with 3 #   Supine: DKTC with feet on green theraball 10x 5-10" Double knee to chest x 2 hold one minute  Piriformis stretch 3x 1 min Hamstring 2x 1 min  STS 10 eccentric control  PATIENT EDUCATION:  Education details: HEP Person educated: Patient Education method: Explanation and Handouts Education comprehension: verbalized understanding  HOME EXERCISE  PROGRAM: Access Code: [facility] URL: [email or web address] 03/19/23:  Prone hip extension  STS controlled with no HHA  03/11/23 (removed child's pose and knee to chest) - Seated Piriformis Stretch  - 2 x daily - 7 x weekly - 1 sets - 3 reps - 30" hold - Prone Press Up On Elbows  - 1-2 x daily - 7 x weekly - 1 sets - 10 reps - 5 hold - Standing Lumbar Extension  - 1-2 x daily - 7 x weekly - 2 sets - 10 reps  02/26/23: childs pose  02/17/23- sitting piriformis stretch x 30"  3x each  7/30/2- Supine Lower Trunk Rotation  - 2 x daily - 7 x weekly - 1 sets - 10 reps - 5" hold4  Date: 02/04/2023 Prepared by: Virgina Organ  Exercises - Supine Bridge  - 2 x daily - 7 x weekly - 1 sets - 10 reps - 5" hold - Supine Hamstring Stretch  - 2 x daily - 7 x weekly - 1 sets - 3 reps - 30" hold - Supine Single Knee to Chest Stretch  - 2 x daily - 7 x weekly - 1 sets - 3 reps - 30" hold  ASSESSMENT:  CLINICAL IMPRESSION:  10th visit progress note, reviewed goals with the following objective findings:  Pt reports compliance with initial HEP 2-3 times a week.  Pt with some memory deficits and reports hard to remember to do on regular basis.  Pt presents with slight improvements with gait cadence, does continue to present with significant gluteal weakness.  Additional exercises add to HEP to address weakness as well as added activities in dept to address impaired balance.  Pt will continue to benefit from skilled intervention to address goals unmet.  OBJECTIVE IMPAIRMENTS: decreased activity tolerance, decreased  balance, decreased ROM, decreased strength, and pain.   ACTIVITY LIMITATIONS: carrying, lifting, standing, and locomotion level  PARTICIPATION LIMITATIONS: shopping and community activity  PERSONAL FACTORS: Time since onset of injury/illness/exacerbation and 1-2 comorbidities: AKI, CVA  are also affecting patient's functional outcome.   REHAB POTENTIAL: Good  CLINICAL DECISION MAKING: Stable/uncomplicated  EVALUATION COMPLEXITY: Low   GOALS: Goals reviewed with patient? No  SHORT TERM GOALS: Target date: 02/25/23  PT to be I in Hep in order to decrease his Rt buttock pain to a 5/10 Baseline:  03/19/23:  Reports compliance with HEP 2-3 times a week; reports pain scale average 5/10 in the morning and reduces throughout the day. Goal status: MET  2.  PT to be able to single leg stance on each foot for 15" to reduce risk of falling  Baseline: Single leg stance: SLS Rt 14", Lt 9" Goal status:  On-going  3.  Pt core and LE strength to be increased to allow pt to be able to come sit to stand 9 x in 30" Baseline: 03/19/23:  see MMT; 9 STS in 30" no HHA Goal status:   MET    LONG TERM GOALS: Target date: 03/18/23  PT to be I in Hep in order to decrease his Rt buttock pain to a 2/10  Goal status:  On-going   2.   PT to be able to single leg stance on each foot for 30" to reduce risk of falling  Baseline:  Goal status:  On-going  3.   Pt core and LE strength to be increased to allow pt to be able to come sit to stand 11 x in 30" Baseline:  Goal status:  On-going  PLAN:  PT FREQUENCY: 2x/week  PT DURATION: 6 weeks  PLANNED INTERVENTIONS: Therapeutic exercises, Neuromuscular re-education, Balance training, Patient/Family education, Self Care, and Manual therapy.  PLAN FOR NEXT SESSION: Next session add squats, SLS, vector stance, tandem, sidestep and piriformis stretch.  Becky Sax, LPTA/CLT; CBIS [phone]  Virgina Organ, PT CLT [phone]  03/19/2023, 2:21  PM

## 2023-03-22 ENCOUNTER — Ambulatory Visit (HOSPITAL_COMMUNITY): Payer: Medicare PPO | Admitting: Physical Therapy

## 2023-03-22 DIAGNOSIS — M5416 Radiculopathy, lumbar region: Secondary | ICD-10-CM

## 2023-03-22 DIAGNOSIS — M6281 Muscle weakness (generalized): Secondary | ICD-10-CM

## 2023-03-22 NOTE — Therapy (Signed)
OUTPATIENT PHYSICAL THERAPY THORACOLUMBAR Treatment     Patient Name: Joseph [name] MRN: [id number] DOB:11-06-1945, 77 y.o., male Today's Date: 03/22/2023  END OF SESSION:   PT End of Session - 03/22/23 1515    Visit Number 11    Number of Visits 22    Date for PT Re-Evaluation 05/06/23    Authorization Type humana; requested 8 additional exercises on 03/19/23    Authorization Time Period 7/25-9/5 12 visits    Authorization - Number of Visits 10    Progress Note Due on Visit 12    PT Start Time 1435    PT Stop Time 1515    PT Time Calculation (min) 40 min    Activity Tolerance Patient tolerated treatment well    Behavior During Therapy [facility] for tasks assessed/performed                  Past Medical History:  Diagnosis Date   1st degree AV block    Atrial fibrillation (HCC)    Diastolic dysfunction 4/13   grade 1   Hypertension    New onset atrial fibrillation (HCC) 06/2015   RBBB    Sinus bradycardia    Stroke (HCC)    no defecits, pt denies having a stroke, says Dr Tresa Endo diagnosed this   Past Surgical History:  Procedure Laterality Date   BIV PACEMAKER INSERTION CRT-P N/A 01/26/2022   Procedure: BIV PACEMAKER INSERTION CRT-P;  Surgeon: Marinus Maw, MD;  [facility];  Service: Cardiovascular;  Laterality: N/A;   COLONOSCOPY     COLONOSCOPY  12/30/2011   Procedure: COLONOSCOPY;  Surgeon: Corbin Ade, MD;  [facility];  Service: Endoscopy;  Laterality: N/A;  1:45PM   COLONOSCOPY N/A 04/19/2019   Procedure: COLONOSCOPY;  Surgeon: Corbin Ade, MD;  [facility];  Service: Endoscopy;  Laterality: N/A;  2:00pm   EXCISION MASS LOWER EXTREMETIES Left 05/12/2018   Procedure: Left foot plantar fibroma excision;  Surgeon: Toni Arthurs, MD;  Location: [facility];  Service: Orthopedics;  Laterality: Left;   FOOT SURGERY Left    LEFT HEART CATH AND CORONARY ANGIOGRAPHY N/A 01/26/2022   Procedure: LEFT HEART CATH  AND CORONARY ANGIOGRAPHY;  Surgeon: Lennette Bihari, MD;  [facility]: [facility];  Service: Cardiovascular;  Laterality: N/A;   SPERMATOCELECTOMY Right 11/13/2019   Procedure: EXCISION OF SPERMATIC CORD CYST;  Surgeon: Malen Gauze, MD;  Location: [facility];  Service: Urology;  Laterality: Right;   VASECTOMY  11/13/2019   Procedure: VASECTOMY;  Surgeon: Malen Gauze, MD;  Location: [facility];  Service: Urology;;   Patient Active Problem List   Diagnosis Date Noted   AKI (acute kidney injury) (HCC) 02/03/2022   NICM (nonischemic cardiomyopathy) (HCC) 02/03/2022   Sinus node dysfunction (HCC)    Atrial flutter (HCC) 01/23/2022   OAB (overactive bladder) 12/27/2020   Nocturia 01/10/2020   Benign prostatic hyperplasia with urinary obstruction 01/10/2020   Non-recurrent unilateral inguinal hernia without obstruction or gangrene    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   History of adenomatous polyp of colon 02/27/2019   Paroxysmal atrial fibrillation (HCC) 07/18/2015   Bradycardia 06/29/2015   Near syncope    Atrial fibrillation with rapid ventricular response (HCC) 06/27/2015   Left ventricular diastolic dysfunction, NYHA class 3 10/26/2014   Kidney cysts 04/04/2013   HTN (hypertension) 03/31/2013    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 03/31/2013   Diastolic dysfunction- grade 2- echo  06/28/15 03/31/2013   RBBB 03/31/2013   AV block, 1st degree 03/31/2013   Sinus bradycardia 03/31/2013   Hematochezia 12/29/2011    PCP: Gareth Morgan  REFERRING PROVIDER: Gareth Morgan  REFERRING DIAG: Low back pain   Rationale for Evaluation and Treatment: Rehabilitation  THERAPY DIAG:  Lumbar radiculopathy Muscle weakness   ONSET DATE: 07/27/22                                                                                                                                                                                             SUBJECIVE STATEMENT:  Pt states that he does the exercises as well  as he can PERTINENT HISTORY:  CVA, AKI  PAIN:  Are you having pain? Yes: NPRS scale: 0/10; worst is an 5/10 Pain location: buttock  Pain description: aggravation  Aggravating factors: mornings  Relieving factors: walk around  PRECAUTIONS: None     WEIGHT BEARING RESTRICTIONS: No  FALLS:  Has patient fallen in last 6 months? No  LIVING ENVIRONMENT: Lives with: lives with their family Lives in: House/apartment Stairs: Yes: Internal: 12 steps; on right going up and External: 4 steps; on right going up; stairs does not bother the buttock pain  Has following equipment at home: None  OCCUPATION: retired  PLOF: Independent  PATIENT GOALS: less pain   OBJECTIVE:   PATIENT SURVEYS:  Difficult as pt has slight dementia, wife assists with questions.    COGNITION: Overall cognitive status: Within functional limits for tasks assessed       MUSCLE LENGTH: Hamstrings: Right 155 deg; Left 155 deg   POSTURE: No Significant postural limitations    LUMBAR ROM:   AROM eval  Flexion Fingers to toes   Extension 20  Right lateral flexion   Left lateral flexion   Right rotation   Left rotation    (Blank rows = not tested)  LOWER EXTREMITY ROM:   [facility]    LOWER EXTREMITY MMT:    MMT Right eval Left eval Right 03/19/23 Left  03/19/23  Hip flexion 4+ 5 5 5   Hip extension 3 3 3+ 3  Hip abduction 3+ 4+ 4- 4+  Hip adduction      Hip internal rotation      Hip external rotation      Knee flexion 4- 5 5 5   Knee extension 5 5    Ankle dorsiflexion 5 5    Ankle plantarflexion      Ankle inversion      Ankle eversion       (Blank rows = not tested)  FUNCTIONAL TESTS: Eval 30 seconds chair stand test:  6 , ( 8 is poor for pt age and sex, 25 is below average) 2 minute walk test:  339 ft pt begins to drag his Rt foot after a minute.   Single leg stance: RT: 6"  , LT 6"   FUNCTIONAL TESTS: 03/19/23: 30 seconds chair stand test:   9 STS in 30" no HHA ( 8 is poor for pt  age and sex, 21 is below average) 2 minute walk test:  332ft begins to drag Rt foot after a 75" Single leg stance: SLS Rt 14", Lt 9"   TODAY'S TREATMENT:                                                                                                                              DATE:  03/22/23 Prone: POE x 2 minutes Heel squeeze x 10  Prone hip extension x 10 Supine : Bridge x 10  knee to chest stretch single and double 3 x 30" each Active hamstring stretch x 3 B Piriformis stretch x 3 figure 4 as well as bringing knee to opposite shoulder Sitting piriformis stretch x 3  Sit to stand x 15  Squat  x10  03/19/23: 360ft no AD 30 seconds chair stand test:   9 STS in 30" no HHA MMT see above Reviewed goals  Seated: Controlled STS no HHA Supine: Bridge 10x + Prone: Hip extension 10x Standing: SLS Rt 14", Lt 9" Tandem stance 2x 30" each Sidestep inside //bars with minimal HHA 3RT (cueing to reduce ER)   03/11/23: Prone:  POE x 5" x 10 Supine:  Passive sciatic nerve glide x 4' on B Seated  piriformis stretch x 30" x 3 Hamstring stretch x 30" x 3 Standing:  Lumbar ext x 10 x 2  Abdominal isometrics x 3" x 10  03/05/23: Standing: Heel raises x 15 Squat x 10 Sit to stand x 10 Passive hamstring stretch on 12" box 30" x 3 B Hip excursions x 3 Marching x 5  Supine : Bridge x 15 LTR x 10  Figure 4 piriformis stretch    03/02/23: Prone: Pressup 5x 10" Hip extension 10x 3" holds Seated STS 10 eccentric control Piriformis stretch 2x 30" Standing: Lumbar extension Squat 10x Heel raise 10x SLS Rt 6", Lt 7" Tandem stance 2x 30" Sidestep down long hallway1RT   02/26/23: Standing: Weight shifting with UE overhead 5x Rotate 5x Controlled STS no HHA 5x  2 sets Lumbar extension 5x Prone: POE x 2 minutes Press ups 5x Hip extension 10x 3" holds Child's pose 2x 30" Side lying : Hip abduction x 10 with 3 #   Supine: DKTC 2x 1' hold Seated: Piriformis  stretch 3x 30"  02/23/23 Prone: POE x 2 minutes Press ups 5x Hip extension 10x 3" holds Side lying : Hip abduction x 10 with 3 #   Supine: DKTC with feet on green theraball 10x 5-10" Double knee to chest x 2 hold  one minute  Piriformis stretch 3x 1 min Hamstring 2x 1 min  STS 10 eccentric control  PATIENT EDUCATION:  Education details: HEP Person educated: Patient Education method: Chief Technology [employer] Education comprehension: verbalized understanding  HOME EXERCISE PROGRAM: Access Code: [facility] URL: [email or web address] 03/19/23:  Prone hip extension  STS controlled with no HHA  03/11/23 (removed child's pose and knee to chest) - Seated Piriformis Stretch  - 2 x daily - 7 x weekly - 1 sets - 3 reps - 30" hold - Prone Press Up On Elbows  - 1-2 x daily - 7 x weekly - 1 sets - 10 reps - 5 hold - Standing Lumbar Extension  - 1-2 x daily - 7 x weekly - 2 sets - 10 reps  02/26/23: childs pose  02/17/23- sitting piriformis stretch x 30"  3x each  7/30/2- Supine Lower Trunk Rotation  - 2 x daily - 7 x weekly - 1 sets - 10 reps - 5" hold4  Date: 02/04/2023 Prepared by: Virgina Organ  Exercises - Supine Bridge  - 2 x daily - 7 x weekly - 1 sets - 10 reps - 5" hold - Supine Hamstring Stretch  - 2 x daily - 7 x weekly - 1 sets - 3 reps - 30" hold - Supine Single Knee to Chest Stretch  - 2 x daily - 7 x weekly - 1 sets - 3 reps - 30" hold  ASSESSMENT:  CLINICAL IMPRESSION:  Today treatment focused on strengthening and stretching gluteal mm to decrease pain and improve function.  Marland Kitchen  Pt presents with slight improvements with gait cadence, does continue to present with significant gluteal weakness.  Additional exercises add to HEP to address weakness as well as added activities in dept to address impaired balance.  Pt will continue to benefit from skilled intervention to address goals unmet.  OBJECTIVE IMPAIRMENTS: decreased activity tolerance, decreased balance,  decreased ROM, decreased strength, and pain.   ACTIVITY LIMITATIONS: carrying, lifting, standing, and locomotion level  PARTICIPATION LIMITATIONS: shopping and community activity  PERSONAL FACTORS: Time since onset of injury/illness/exacerbation and 1-2 comorbidities: AKI, CVA  are also affecting patient's functional outcome.   REHAB POTENTIAL: Good  CLINICAL DECISION MAKING: Stable/uncomplicated  EVALUATION COMPLEXITY: Low   GOALS: Goals reviewed with patient? No  SHORT TERM GOALS: Target date: 02/25/23  PT to be I in Hep in order to decrease his Rt buttock pain to a 5/10 Baseline:  03/19/23:  Reports compliance with HEP 2-3 times a week; reports pain scale average 5/10 in the morning and reduces throughout the day. Goal status: MET  2.  PT to be able to single leg stance on each foot for 15" to reduce risk of falling  Baseline: Single leg stance: SLS Rt 14", Lt 9" Goal status:  On-going  3.  Pt core and LE strength to be increased to allow pt to be able to come sit to stand 9 x in 30" Baseline: 03/19/23:  see MMT; 9 STS in 30" no HHA Goal status:   MET    LONG TERM GOALS: Target date: 03/18/23  PT to be I in Hep in order to decrease his Rt buttock pain to a 2/10  Goal status:  On-going   2.   PT to be able to single leg stance on each foot for 30" to reduce risk of falling  Baseline:  Goal status:  On-going  3.   Pt core and LE strength to be increased to allow pt to  be able to come sit to stand 11 x in 30" Baseline:  Goal status:  On-going   PLAN:  PT FREQUENCY: 2x/week  PT DURATION: 6 weeks  PLANNED INTERVENTIONS: Therapeutic exercises, Neuromuscular re-education, Balance training, Patient/Family education, Self Care, and Manual therapy.  PLAN FOR NEXT SESSION: Next session add , SLS, vector stance, tandem, sidestep  Virgina Organ, PT CLT [phone]  03/22/2023, 3:05 PM

## 2023-03-22 NOTE — Addendum Note (Signed)
Addended by: Bella Kennedy on: 03/22/2023 12:01 PM   Modules accepted: Orders

## 2023-03-24 ENCOUNTER — Ambulatory Visit (HOSPITAL_COMMUNITY): Payer: Medicare PPO | Admitting: Physical Therapy

## 2023-03-24 DIAGNOSIS — M5416 Radiculopathy, lumbar region: Secondary | ICD-10-CM | POA: Diagnosis not present

## 2023-03-24 DIAGNOSIS — M6281 Muscle weakness (generalized): Secondary | ICD-10-CM

## 2023-03-24 NOTE — Therapy (Signed)
OUTPATIENT PHYSICAL THERAPY THORACOLUMBAR Treatment     Patient Name: Joseph [name] MRN: [id number] DOB:1945-08-27, 77 y.o., male Today's Date: 03/24/2023  END OF SESSION:   PT End of Session - 03/24/23 1600    Visit Number 12    Number of Visits 17    Date for PT Re-Evaluation 05/06/23    Authorization Type humana; 8 additional visits from 9/6-10/4    Authorization Time Period 9/6-10/24 12 visits    Progress Note Due on Visit 17    PT Start Time 1520    PT Stop Time 1600    PT Time Calculation (min) 40 min    Activity Tolerance Patient tolerated treatment well    Behavior During Therapy [facility] for tasks assessed/performed               Past Medical History:  Diagnosis Date   1st degree AV block    Atrial fibrillation (HCC)    Diastolic dysfunction 4/13   grade 1   Hypertension    New onset atrial fibrillation (HCC) 06/2015   RBBB    Sinus bradycardia    Stroke (HCC)    no defecits, pt denies having a stroke, says Dr Tresa Endo diagnosed this   Past Surgical History:  Procedure Laterality Date   BIV PACEMAKER INSERTION CRT-P N/A 01/26/2022   Procedure: BIV PACEMAKER INSERTION CRT-P;  Surgeon: Marinus Maw, MD;  [facility];  Service: Cardiovascular;  Laterality: N/A;   COLONOSCOPY     COLONOSCOPY  12/30/2011   Procedure: COLONOSCOPY;  Surgeon: Corbin Ade, MD;  [facility]: [facility];  Service: Endoscopy;  Laterality: N/A;  1:45PM   COLONOSCOPY N/A 04/19/2019   Procedure: COLONOSCOPY;  Surgeon: Corbin Ade, MD;  [facility]: [facility];  Service: Endoscopy;  Laterality: N/A;  2:00pm   EXCISION MASS LOWER EXTREMETIES Left 05/12/2018   Procedure: Left foot plantar fibroma excision;  Surgeon: Toni Arthurs, MD;  Location: [facility];  Service: Orthopedics;  Laterality: Left;   FOOT SURGERY Left    LEFT HEART CATH AND CORONARY ANGIOGRAPHY N/A 01/26/2022   Procedure: LEFT HEART CATH AND CORONARY ANGIOGRAPHY;  Surgeon: Lennette Bihari, MD;  [facility];  Service: Cardiovascular;  Laterality: N/A;   SPERMATOCELECTOMY Right 11/13/2019   Procedure: EXCISION OF SPERMATIC CORD CYST;  Surgeon: Malen Gauze, MD;  Location: [facility];  Service: Urology;  Laterality: Right;   VASECTOMY  11/13/2019   Procedure: VASECTOMY;  Surgeon: Malen Gauze, MD;  Location: [facility];  Service: Urology;;   Patient Active Problem List   Diagnosis Date Noted   AKI (acute kidney injury) (HCC) 02/03/2022   NICM (nonischemic cardiomyopathy) (HCC) 02/03/2022   Sinus node dysfunction (HCC)    Atrial flutter (HCC) 01/23/2022   OAB (overactive bladder) 12/27/2020   Nocturia 01/10/2020   Benign prostatic hyperplasia with urinary obstruction 01/10/2020   Non-recurrent unilateral inguinal hernia without obstruction or gangrene    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   History of adenomatous polyp of colon 02/27/2019   Paroxysmal atrial fibrillation (HCC) 07/18/2015   Bradycardia 06/29/2015   Near syncope    Atrial fibrillation with rapid ventricular response (HCC) 06/27/2015   Left ventricular diastolic dysfunction, NYHA class 3 10/26/2014   Kidney cysts 04/04/2013   HTN (hypertension) 03/31/2013    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 03/31/2013   Diastolic dysfunction- grade 2- echo 06/28/15 03/31/2013   RBBB 03/31/2013   AV block, 1st degree 03/31/2013  Sinus bradycardia 03/31/2013   Hematochezia 12/29/2011    PCP: Gareth Morgan  REFERRING PROVIDER: Gareth Morgan  REFERRING DIAG: Low back pain   Rationale for Evaluation and Treatment: Rehabilitation  THERAPY DIAG:  Lumbar radiculopathy Muscle weakness   ONSET DATE: 07/27/22                                                                                                                                                                                             SUBJECIVE STATEMENT:  PT states that he is doing fair, he has not been doing his exercises like he should be.     PERTINENT HISTORY:  CVA, AKI  PAIN:  Are you having pain? Yes: NPRS scale: 0/10;  Pain location: buttock  Pain description: aggravation  Aggravating factors: mornings  Relieving factors: walk around  PRECAUTIONS: None     WEIGHT BEARING RESTRICTIONS: No  FALLS:  Has patient fallen in last 6 months? No  LIVING ENVIRONMENT: Lives with: lives with their family Lives in: House/apartment Stairs: Yes: Internal: 12 steps; on right going up and External: 4 steps; on right going up; stairs does not bother the buttock pain  Has following equipment at home: None  OCCUPATION: retired  PLOF: Independent  PATIENT GOALS: less pain   OBJECTIVE:   PATIENT SURVEYS:  Difficult as pt has slight dementia, wife assists with questions.    COGNITION: Overall cognitive status: Within functional limits for tasks assessed       MUSCLE LENGTH: Hamstrings: Right 155 deg; Left 155 deg   POSTURE: No Significant postural limitations    LUMBAR ROM:   AROM eval  Flexion Fingers to toes   Extension 20  Right lateral flexion   Left lateral flexion   Right rotation   Left rotation    (Blank rows = not tested)  LOWER EXTREMITY ROM:   WFL    LOWER EXTREMITY MMT:    MMT Right eval Left eval Right 03/19/23 Left  03/19/23  Hip flexion 4+ 5 5 5   Hip extension 3 3 3+ 3  Hip abduction 3+ 4+ 4- 4+  Hip adduction      Hip internal rotation      Hip external rotation      Knee flexion 4- 5 5 5   Knee extension 5 5    Ankle dorsiflexion 5 5    Ankle plantarflexion      Ankle inversion      Ankle eversion       (Blank rows = not tested)  FUNCTIONAL TESTS: Eval 30 seconds chair stand test:   6 , ( 8 is poor for  pt age and sex, 46 is below average) 2 minute walk test:  339 ft pt begins to drag his Rt foot after a minute.   Single leg stance: RT: 6"  , LT 6"   FUNCTIONAL TESTS: 03/19/23: 30 seconds chair stand test:   9 STS in 30" no HHA ( 8 is poor for pt age and sex, 76 is  below average) 2 minute walk test:  398ft begins to drag Rt foot after a 75" Single leg stance: SLS Rt 14", Lt 9"   TODAY'S TREATMENT:                                                                                                                              DATE:  03/24/23: Standing: Heel raise x 15 Squat x 15  Single leg stance x 5 B  Hip excursions x 3  Side step in squatted position x 2  Sit to stand x 15 Piriformis stretch x 3  Supine: Knee to chest x 3 for 30" each Bridge x 15. Prone: B UE extension x 10  Press up x 5  Prone hip extension x 10 B   03/22/23 Prone: POE x 2 minutes Heel squeeze x 10  Prone hip extension x 10 Supine : Bridge x 10  knee to chest stretch single and double 3 x 30" each Active hamstring stretch x 3 B Piriformis stretch x 3 figure 4 as well as bringing knee to opposite shoulder Sitting piriformis stretch x 3  Sit to stand x 15  Squat  x10  03/19/23: 342ft no AD 30 seconds chair stand test:   9 STS in 30" no HHA MMT see above Reviewed goals  Seated: Controlled STS no HHA Supine: Bridge 10x + Prone: Hip extension 10x Standing: SLS Rt 14", Lt 9" Tandem stance 2x 30" each Sidestep inside //bars with minimal HHA 3RT (cueing to reduce ER)   03/11/23: Prone:  POE x 5" x 10 Supine:  Passive sciatic nerve glide x 4' on B Seated  piriformis stretch x 30" x 3 Hamstring stretch x 30" x 3 Standing:  Lumbar ext x 10 x 2  Abdominal isometrics x 3" x 10  03/05/23: Standing: Heel raises x 15 Squat x 10 Sit to stand x 10 Passive hamstring stretch on 12" box 30" x 3 B Hip excursions x 3 Marching x 5  Supine : Bridge x 15 LTR x 10  Figure 4 piriformis stretch    PATIENT EDUCATION:  Education details: HEP Person educated: Patient Education method: Chief Technology Officer Education comprehension: verbalized understanding  HOME EXERCISE PROGRAM: Access Code: [facility] URL: [email or web address] 03/19/23:   Prone hip extension  STS controlled with no HHA  03/11/23 (removed child's pose and knee to chest) - Seated Piriformis Stretch  - 2 x daily - 7 x weekly - 1 sets - 3 reps - 30" hold - Prone Press Up On Elbows  - 1-2 x daily -  7 x weekly - 1 sets - 10 reps - 5 hold - Standing Lumbar Extension  - 1-2 x daily - 7 x weekly - 2 sets - 10 reps  02/26/23: childs pose  02/17/23- sitting piriformis stretch x 30"  3x each  7/30/2- Supine Lower Trunk Rotation  - 2 x daily - 7 x weekly - 1 sets - 10 reps - 5" hold4  Date: 02/04/2023 Prepared by: Virgina Organ  Exercises - Supine Bridge  - 2 x daily - 7 x weekly - 1 sets - 10 reps - 5" hold - Supine Hamstring Stretch  - 2 x daily - 7 x weekly - 1 sets - 3 reps - 30" hold - Supine Single Knee to Chest Stretch  - 2 x daily - 7 x weekly - 1 sets - 3 reps - 30" hold  ASSESSMENT:  CLINICAL IMPRESSION:  Therapy focused on standing stability as well as beginning to work on balance.  Pt  feeling better overall but still has buttock pain B.  PT has greater pain on the Lt side than the right.   Pt will continue to benefit from skilled intervention to address goals unmet.  OBJECTIVE IMPAIRMENTS: decreased activity tolerance, decreased balance, decreased ROM, decreased strength, and pain.   ACTIVITY LIMITATIONS: carrying, lifting, standing, and locomotion level  PARTICIPATION LIMITATIONS: shopping and community activity  PERSONAL FACTORS: Time since onset of injury/illness/exacerbation and 1-2 comorbidities: AKI, CVA  are also affecting patient's functional outcome.   REHAB POTENTIAL: Good  CLINICAL DECISION MAKING: Stable/uncomplicated  EVALUATION COMPLEXITY: Low   GOALS: Goals reviewed with patient? No  SHORT TERM GOALS: Target date: 02/25/23  PT to be I in Hep in order to decrease his Rt buttock pain to a 5/10 Baseline:  03/19/23:  Reports compliance with HEP 2-3 times a week; reports pain scale average 5/10 in the morning and reduces throughout  the day. Goal status: MET  2.  PT to be able to single leg stance on each foot for 15" to reduce risk of falling  Baseline: Single leg stance: SLS Rt 14", Lt 9" Goal status:  On-going  3.  Pt core and LE strength to be increased to allow pt to be able to come sit to stand 9 x in 30" Baseline: 03/19/23:  see MMT; 9 STS in 30" no HHA Goal status:   MET    LONG TERM GOALS: Target date: 03/18/23  PT to be I in Hep in order to decrease his Rt buttock pain to a 2/10  Goal status:  On-going   2.   PT to be able to single leg stance on each foot for 30" to reduce risk of falling  Baseline:  Goal status:  On-going  3.   Pt core and LE strength to be increased to allow pt to be able to come sit to stand 11 x in 30" Baseline:  Goal status:  On-going   PLAN:  PT FREQUENCY: 2x/week  PT DURATION: 6 weeks  PLANNED INTERVENTIONS: Therapeutic exercises, Neuromuscular re-education, Balance training, Patient/Family education, Self Care, and Manual therapy.  PLAN FOR NEXT SESSION: Next session add vector stance and tandem,   Virgina Organ, PT CLT [phone]  03/24/2023, 4:02 PM

## 2023-03-31 ENCOUNTER — Ambulatory Visit (HOSPITAL_COMMUNITY): Payer: Medicare PPO

## 2023-03-31 ENCOUNTER — Encounter (HOSPITAL_COMMUNITY): Payer: Self-pay

## 2023-03-31 ENCOUNTER — Telehealth (HOSPITAL_COMMUNITY): Payer: Self-pay

## 2023-03-31 DIAGNOSIS — M5416 Radiculopathy, lumbar region: Secondary | ICD-10-CM

## 2023-03-31 DIAGNOSIS — M6281 Muscle weakness (generalized): Secondary | ICD-10-CM

## 2023-03-31 NOTE — Therapy (Signed)
OUTPATIENT PHYSICAL THERAPY THORACOLUMBAR Treatment     Patient Name: Joseph [name] MRN: [id number] DOB:[birth year], 77 y.o., male Today's Date: 03/31/2023  END OF SESSION:   PT End of Session - 03/24/23 1600    Visit Number 12    Number of Visits 17    Date for PT Re-Evaluation 05/06/23    Authorization Type humana; 8 additional visits from 9/6-10/4    Authorization Time Period 9/6-10/24 12 visits    Progress Note Due on Visit 17    PT Start Time 1520    PT Stop Time 1600    PT Time Calculation (min) 40 min    Activity Tolerance Patient tolerated treatment well    Behavior During Therapy [facility] for tasks assessed/performed               Past Medical History:  Diagnosis Date   1st degree AV block    Atrial fibrillation (HCC)    Diastolic dysfunction 4/13   grade 1   Hypertension    New onset atrial fibrillation (HCC) 06/2015   RBBB    Sinus bradycardia    Stroke (HCC)    no defecits, pt denies having a stroke, says Dr Tresa Endo diagnosed this   Past Surgical History:  Procedure Laterality Date   BIV PACEMAKER INSERTION CRT-P N/A 01/26/2022   Procedure: BIV PACEMAKER INSERTION CRT-P;  Surgeon: Marinus Maw, MD;  [facility];  Service: Cardiovascular;  Laterality: N/A;   COLONOSCOPY     COLONOSCOPY  12/30/2011   Procedure: COLONOSCOPY;  Surgeon: Corbin Ade, MD;  [facility];  Service: Endoscopy;  Laterality: N/A;  1:45PM   COLONOSCOPY N/A 04/19/2019   Procedure: COLONOSCOPY;  Surgeon: Corbin Ade, MD;  [facility];  Service: Endoscopy;  Laterality: N/A;  2:00pm   EXCISION MASS LOWER EXTREMETIES Left 05/12/2018   Procedure: Left foot plantar fibroma excision;  Surgeon: Toni Arthurs, MD;  Location: [facility];  Service: Orthopedics;  Laterality: Left;   FOOT SURGERY Left    LEFT HEART CATH AND CORONARY ANGIOGRAPHY N/A 01/26/2022   Procedure: LEFT HEART CATH AND CORONARY ANGIOGRAPHY;  Surgeon: Lennette Bihari, MD;  [facility];  Service: Cardiovascular;  Laterality: N/A;   SPERMATOCELECTOMY Right 11/13/2019   Procedure: EXCISION OF SPERMATIC CORD CYST;  Surgeon: Malen Gauze, MD;  Location: [facility];  Service: Urology;  Laterality: Right;   VASECTOMY  11/13/2019   Procedure: VASECTOMY;  Surgeon: Malen Gauze, MD;  Location: [facility];  Service: Urology;;   Patient Active Problem List   Diagnosis Date Noted   AKI (acute kidney injury) (HCC) 02/03/2022   NICM (nonischemic cardiomyopathy) (HCC) 02/03/2022   Sinus node dysfunction (HCC)    Atrial flutter (HCC) 01/23/2022   OAB (overactive bladder) 12/27/2020   Nocturia 01/10/2020   Benign prostatic hyperplasia with urinary obstruction 01/10/2020   Non-recurrent unilateral inguinal hernia without obstruction or gangrene    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   History of adenomatous polyp of colon 02/27/2019   Paroxysmal atrial fibrillation (HCC) 07/18/2015   Bradycardia 06/29/2015   Near syncope    Atrial fibrillation with rapid ventricular response (HCC) 06/27/2015   Left ventricular diastolic dysfunction, NYHA class 3 10/26/2014   Kidney cysts 04/04/2013   HTN (hypertension) 03/31/2013    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 03/31/2013   Diastolic dysfunction- grade 2- echo 06/28/15 03/31/2013   RBBB 03/31/2013   AV block, 1st degree 03/31/2013  Sinus bradycardia 03/31/2013   Hematochezia 12/29/2011    PCP: Gareth Morgan  REFERRING PROVIDER: Gareth Morgan  REFERRING DIAG: Low back pain   Rationale for Evaluation and Treatment: Rehabilitation  THERAPY DIAG:  Lumbar radiculopathy Muscle weakness   ONSET DATE: 07/27/22                                                                                                                                                                                             SUBJECIVE STATEMENT:   Pt initially thought no show, called wife who stated he was headed there.  She called and  reminded him where he was going.  Reports he had intense pain this morning but feeling good now.     PERTINENT HISTORY:  CVA, AKI  PAIN:  Are you having pain? Yes: NPRS scale: 0/10;  Pain location: buttock  Pain description: aggravation  Aggravating factors: mornings  Relieving factors: walk around  PRECAUTIONS: None     WEIGHT BEARING RESTRICTIONS: No  FALLS:  Has patient fallen in last 6 months? No  LIVING ENVIRONMENT: Lives with: lives with their family Lives in: House/apartment Stairs: Yes: Internal: 12 steps; on right going up and External: 4 steps; on right going up; stairs does not bother the buttock pain  Has following equipment at home: None  OCCUPATION: retired  PLOF: Independent  PATIENT GOALS: less pain   OBJECTIVE:   PATIENT SURVEYS:  Difficult as pt has slight dementia, wife assists with questions.    COGNITION: Overall cognitive status: Within functional limits for tasks assessed       MUSCLE LENGTH: Hamstrings: Right 155 deg; Left 155 deg   POSTURE: No Significant postural limitations    LUMBAR ROM:   AROM eval  Flexion Fingers to toes   Extension 20  Right lateral flexion   Left lateral flexion   Right rotation   Left rotation    (Blank rows = not tested)  LOWER EXTREMITY ROM:   [employer]    LOWER EXTREMITY MMT:    MMT Right eval Left eval Right 03/19/23 Left  03/19/23  Hip flexion 4+ 5 5 5   Hip extension 3 3 3+ 3  Hip abduction 3+ 4+ 4- 4+  Hip adduction      Hip internal rotation      Hip external rotation      Knee flexion 4- 5 5 5   Knee extension 5 5    Ankle dorsiflexion 5 5    Ankle plantarflexion      Ankle inversion      Ankle eversion       (Blank rows = not  tested)  FUNCTIONAL TESTS: Eval 30 seconds chair stand test:   6 , ( 8 is poor for pt age and sex, 2 is below average) 2 minute walk test:  339 ft pt begins to drag his Rt foot after a minute.   Single leg stance: RT: 6"  , LT 6"   FUNCTIONAL TESTS:  03/19/23: 30 seconds chair stand test:   9 STS in 30" no HHA ( 8 is poor for pt age and sex, 34 is below average) 2 minute walk test:  379ft begins to drag Rt foot after a 75" Single leg stance: SLS Rt 14", Lt 9"   TODAY'S TREATMENT:                                                                                                                              DATE:  03/30/23: Sit to stand 15x Heel raise 20x Toe raise 15x Hip excursion 5x Squat 15x Seated piriformis stretch 3x 30" SLS LT 7", Rt 16" Tandem stance 2x 30" Vector stance 3x 5"  03/24/23: Standing: Heel raise x 15 Squat x 15  Single leg stance x 5 B  Hip excursions x 3  Side step in squatted position x 2  Sit to stand x 15 Piriformis stretch x 3  Supine: Knee to chest x 3 for 30" each Bridge x 15. Prone: B UE extension x 10  Press up x 5  Prone hip extension x 10 B   03/22/23 Prone: POE x 2 minutes Heel squeeze x 10  Prone hip extension x 10 Supine : Bridge x 10  knee to chest stretch single and double 3 x 30" each Active hamstring stretch x 3 B Piriformis stretch x 3 figure 4 as well as bringing knee to opposite shoulder Sitting piriformis stretch x 3  Sit to stand x 15  Squat  x10  03/19/23: 320ft no AD 30 seconds chair stand test:   9 STS in 30" no HHA MMT see above Reviewed goals  Seated: Controlled STS no HHA Supine: Bridge 10x + Prone: Hip extension 10x Standing: SLS Rt 14", Lt 9" Tandem stance 2x 30" each Sidestep inside //bars with minimal HHA 3RT (cueing to reduce ER)   03/11/23: Prone:  POE x 5" x 10 Supine:  Passive sciatic nerve glide x 4' on B Seated  piriformis stretch x 30" x 3 Hamstring stretch x 30" x 3 Standing:  Lumbar ext x 10 x 2  Abdominal isometrics x 3" x 10  03/05/23: Standing: Heel raises x 15 Squat x 10 Sit to stand x 10 Passive hamstring stretch on 12" box 30" x 3 B Hip excursions x 3 Marching x 5  Supine : Bridge x 15 LTR x 10  Figure 4  piriformis stretch    PATIENT EDUCATION:  Education details: HEP Person educated: Patient Education method: Chief Technology Officer Education comprehension: verbalized understanding  HOME EXERCISE PROGRAM: Access Code: [facility] URL:  [email or web address] 03/19/23:  Prone hip extension  STS controlled with no HHA  03/11/23 (removed child's pose and knee to chest) - Seated Piriformis Stretch  - 2 x daily - 7 x weekly - 1 sets - 3 reps - 30" hold - Prone Press Up On Elbows  - 1-2 x daily - 7 x weekly - 1 sets - 10 reps - 5 hold - Standing Lumbar Extension  - 1-2 x daily - 7 x weekly - 2 sets - 10 reps  02/26/23: childs pose  02/17/23- sitting piriformis stretch x 30"  3x each  7/30/2- Supine Lower Trunk Rotation  - 2 x daily - 7 x weekly - 1 sets - 10 reps - 5" hold4  Date: 02/04/2023 Prepared by: Virgina Organ  Exercises - Supine Bridge  - 2 x daily - 7 x weekly - 1 sets - 10 reps - 5" hold - Supine Hamstring Stretch  - 2 x daily - 7 x weekly - 1 sets - 3 reps - 30" hold - Supine Single Knee to Chest Stretch  - 2 x daily - 7 x weekly - 1 sets - 3 reps - 30" hold  ASSESSMENT:  CLINICAL IMPRESSION:  Session focus with lumbar stability with additional balance training.  Added tandem and vector stance with good stability noted following cueing for core activation and posture to assist with balance, intermittent HHA required for safety.  EOS pt reports he is feeling better.  Reviewed benefits of completing exercises regularly, reports he usually does about 2x/week when he can remember.    OBJECTIVE IMPAIRMENTS: decreased activity tolerance, decreased balance, decreased ROM, decreased strength, and pain.   ACTIVITY LIMITATIONS: carrying, lifting, standing, and locomotion level  PARTICIPATION LIMITATIONS: shopping and community activity  PERSONAL FACTORS: Time since onset of injury/illness/exacerbation and 1-2 comorbidities: AKI, CVA  are also affecting patient's  functional outcome.   REHAB POTENTIAL: Good  CLINICAL DECISION MAKING: Stable/uncomplicated  EVALUATION COMPLEXITY: Low   GOALS: Goals reviewed with patient? No  SHORT TERM GOALS: Target date: 02/25/23  PT to be I in Hep in order to decrease his Rt buttock pain to a 5/10 Baseline:  03/19/23:  Reports compliance with HEP 2-3 times a week; reports pain scale average 5/10 in the morning and reduces throughout the day. Goal status: MET  2.  PT to be able to single leg stance on each foot for 15" to reduce risk of falling  Baseline: Single leg stance: SLS Rt 14", Lt 9" Goal status:  On-going  3.  Pt core and LE strength to be increased to allow pt to be able to come sit to stand 9 x in 30" Baseline: 03/19/23:  see MMT; 9 STS in 30" no HHA Goal status:   MET    LONG TERM GOALS: Target date: 03/18/23  PT to be I in Hep in order to decrease his Rt buttock pain to a 2/10  Goal status:  On-going   2.   PT to be able to single leg stance on each foot for 30" to reduce risk of falling  Baseline:  Goal status:  On-going  3.   Pt core and LE strength to be increased to allow pt to be able to come sit to stand 11 x in 30" Baseline:  Goal status:  On-going   PLAN:  PT FREQUENCY: 2x/week  PT DURATION: 6 weeks  PLANNED INTERVENTIONS: Therapeutic exercises, Neuromuscular re-education, Balance training, Patient/Family education, Self Care, and Manual therapy.  PLAN FOR NEXT  SESSION: Add bodycraft retro and sidestep gait.    Becky Sax, LPTA/CLT; CBIS [phone]  Juel Burrow, PTA 03/31/2023, 12:12 PM  03/31/2023, 12:12 PM

## 2023-03-31 NOTE — Telephone Encounter (Signed)
No show #2, called and spoke to wife who stated he was supposed to be at therapy. Wife stated she will give him a call to find out where he is, therapist does have opening later today.   Becky Sax, LPTA/CLT; [place] [phone]

## 2023-04-02 ENCOUNTER — Encounter (HOSPITAL_COMMUNITY): Payer: Self-pay

## 2023-04-02 ENCOUNTER — Encounter (HOSPITAL_COMMUNITY): Payer: Medicare PPO

## 2023-04-02 NOTE — Therapy (Signed)
[facility] [address] [place], [place], [address] Phone: [phone]   Fax:  [phone]  Patient Details  Name: Joseph [name] MRN: [id number] Date of Birth: [birth year] Referring Provider:  No ref. provider found  Encounter Date: 04/02/2023  No show #3 called and left message on wife's phone. Stated No-show/attendance policy and would discuss new attendance contract/call to schedule appointment at a time. Informed on voicemail to call clinic back.   Nelida Meuse, PT 04/02/2023, 11:05 AM  [facility] [address] [place], [place], [address] Phone: [phone]   Fax:  [phone]

## 2023-04-03 ENCOUNTER — Other Ambulatory Visit: Payer: Self-pay | Admitting: Physician Assistant

## 2023-04-03 ENCOUNTER — Other Ambulatory Visit: Payer: Self-pay | Admitting: Cardiovascular Disease

## 2023-04-05 ENCOUNTER — Ambulatory Visit: Payer: Medicare PPO | Admitting: Urology

## 2023-04-05 VITALS — BP 120/88 | HR 59

## 2023-04-05 DIAGNOSIS — N401 Enlarged prostate with lower urinary tract symptoms: Secondary | ICD-10-CM | POA: Diagnosis not present

## 2023-04-05 DIAGNOSIS — N138 Other obstructive and reflux uropathy: Secondary | ICD-10-CM | POA: Diagnosis not present

## 2023-04-05 DIAGNOSIS — N3281 Overactive bladder: Secondary | ICD-10-CM

## 2023-04-05 DIAGNOSIS — R351 Nocturia: Secondary | ICD-10-CM

## 2023-04-05 MED ORDER — MIRABEGRON ER 50 MG PO TB24: 50.0000 mg | ORAL_TABLET | Freq: Every day | ORAL | 3 refills | Status: DC

## 2023-04-05 MED ORDER — ALFUZOSIN HCL ER 10 MG PO TB24
10.0000 mg | ORAL_TABLET | Freq: Every day | ORAL | 3 refills | Status: DC
Start: 2023-04-05 — End: 2023-10-04

## 2023-04-05 MED ORDER — SOLIFENACIN SUCCINATE 5 MG PO TABS
5.0000 mg | ORAL_TABLET | Freq: Every day | ORAL | 3 refills | Status: DC
Start: 2023-04-05 — End: 2023-10-04

## 2023-04-05 NOTE — Progress Notes (Unsigned)
04/05/2023 10:50 AM   Joseph [name] [birth year] [id number]  Referring provider: Gareth Morgan, MD [address] [place],  [place] [address]  Followup BPH and OAB   HPI: [name] is a is a 77yo here for followup for OAB and BPH. IPSS 4 QOL 1 on mirabegron, vesicare, and uroxatral. Urine stream is strong. No straining to urinate. Urinary urgency is rare. Nocturia 1-2x depending on fluid consumption   PMH: Past Medical History:  Diagnosis Date   1st degree AV block    Atrial fibrillation (HCC)    Diastolic dysfunction 4/13   grade 1   Hypertension    New onset atrial fibrillation (HCC) 06/2015   RBBB    Sinus bradycardia    Stroke (HCC)    no defecits, pt denies having a stroke, says Dr Joseph [name] diagnosed this    Surgical History: Past Surgical History:  Procedure Laterality Date   BIV PACEMAKER INSERTION CRT-P N/A 01/26/2022   Procedure: BIV PACEMAKER INSERTION CRT-P;  Surgeon: Joseph Maw, MD;  Location: [facility] INVASIVE CV LAB;  Service: Cardiovascular;  Laterality: N/A;   COLONOSCOPY     COLONOSCOPY  12/30/2011   Procedure: COLONOSCOPY;  Surgeon: Joseph Ade, MD;  Location: [name];  Service: Endoscopy;  Laterality: N/A;  1:45PM   COLONOSCOPY N/A 04/19/2019   Procedure: COLONOSCOPY;  Surgeon: Joseph Ade, MD;  Location: [name];  Service: Endoscopy;  Laterality: N/A;  2:00pm   EXCISION MASS LOWER EXTREMETIES Left 05/12/2018   Procedure: Left foot plantar fibroma excision;  Surgeon: Joseph Arthurs, MD;  Location: [facility];  Service: Orthopedics;  Laterality: Left;   FOOT SURGERY Left    LEFT HEART CATH AND CORONARY ANGIOGRAPHY N/A 01/26/2022   Procedure: LEFT HEART CATH AND CORONARY ANGIOGRAPHY;  Surgeon: Joseph Bihari, MD;  Location: [facility] INVASIVE CV LAB;  Service: Cardiovascular;  Laterality: N/A;   SPERMATOCELECTOMY Right 11/13/2019   Procedure: EXCISION OF SPERMATIC CORD CYST;  Surgeon: Joseph Gauze, MD;  Location: [facility];   Service: Urology;  Laterality: Right;   VASECTOMY  11/13/2019   Procedure: VASECTOMY;  Surgeon: Joseph Gauze, MD;  Location: [facility];  Service: Urology;;    Home Medications:  Allergies as of 04/05/2023   No Known Allergies      Medication List        Accurate as of April 05, 2023 10:50 AM. If you have any questions, ask your nurse or doctor.          acetaminophen 325 MG tablet Commonly known as: TYLENOL Take 2 tablets (650 mg total) by mouth every 6 (six) hours as needed for mild pain, fever or headache (or Fever >/= 101).   alfuzosin 10 MG 24 hr tablet Commonly known as: UROXATRAL TAKE 1 TABLET(10 MG) BY MOUTH DAILY WITH BREAKFAST   apixaban 5 MG Tabs tablet Commonly known as: Eliquis Take 1 tablet (5 mg total) by mouth 2 (two) times daily.   carvedilol 12.5 MG tablet Commonly known as: COREG Take 1 tablet (12.5 mg total) by mouth 2 (two) times daily.   cholecalciferol 25 MCG (1000 UNIT) tablet Commonly known as: VITAMIN D3 Take 1 tablet (1,000 Units total) by mouth daily.   cloNIDine 0.1 MG tablet Commonly known as: Catapres Take 1 tablet (0.1 mg total) by mouth 2 (two) times daily.   donepezil 5 MG tablet Commonly known as: ARICEPT Take 5 mg by mouth daily.   doxazosin 4 MG tablet Commonly  known as: CARDURA Take 4 mg by mouth at bedtime.   Entresto 49-51 MG Generic drug: sacubitril-valsartan Take 1 tablet by mouth 2 (two) times daily.   Entresto 24-26 MG Generic drug: sacubitril-valsartan Take 1 tablet by mouth 2 (two) times daily.   hydrochlorothiazide 25 MG tablet Commonly known as: HYDRODIURIL Take 25 mg by mouth daily.   latanoprost 0.005 % ophthalmic solution Commonly known as: XALATAN Place 1 drop into both eyes at bedtime.   levocetirizine 5 MG tablet Commonly known as: XYZAL Take 1 tablet (5 mg total) by mouth every evening.   LORazepam 0.5 MG tablet Commonly known as: ATIVAN Take 0.5 mg by mouth daily as needed for  sleep.   Lubricant Eye Drops 0.4-0.3 % Soln Generic drug: Polyethyl Glycol-Propyl Glycol Place 1 drop into both eyes 3 (three) times daily as needed (dry/irritated eyes.).   multivitamin with minerals Tabs tablet Take 1 tablet by mouth daily.   Myrbetriq 50 MG Tb24 tablet Generic drug: mirabegron ER TAKE 1 TABLET(50 MG) BY MOUTH DAILY   PROSTATE HEALTH PO Take 1 capsule by mouth daily. Super Beta Prostate   rosuvastatin 5 MG tablet Commonly known as: Crestor Take 1 tablet (5 mg total) by mouth daily.   saw palmetto 500 MG capsule Take 500 mg by mouth every evening.   solifenacin 5 MG tablet Commonly known as: VESICARE Take 1 tablet (5 mg total) by mouth at bedtime.   spironolactone 25 MG tablet Commonly known as: ALDACTONE TAKE 1/2 TABLET BY MOUTH DAILY   Turmeric 500 MG Caps Take 500 mg by mouth daily.   Vitamin B Complex Tabs Take 1 tablet by mouth daily.        Allergies: No Known Allergies  Family History: Family History  Problem Relation Age of Onset   Cancer Brother    CVA Mother    Hypertension Father    Colon cancer Neg Hx     Social History:  reports that he quit smoking about 28 years ago. His smoking use included cigarettes. He has never used smokeless tobacco. He reports current alcohol use. He reports that he does not use drugs.  ROS: All other review of systems were reviewed and are negative except what is noted above in HPI  Physical Exam: BP 120/88   Pulse (!) 59   Constitutional:  Alert and oriented, No acute distress. HEENT: [place] AT, moist mucus membranes.  Trachea midline, no masses. Cardiovascular: No clubbing, cyanosis, or edema. Respiratory: Normal respiratory effort, no increased work of breathing. GI: Abdomen is soft, nontender, nondistended, no abdominal masses GU: No CVA tenderness.  Lymph: No cervical or inguinal lymphadenopathy. Skin: No rashes, bruises or suspicious lesions. Neurologic: Grossly intact, no focal deficits,  moving all 4 extremities. Psychiatric: Normal mood and affect.  Laboratory Data: Lab Results  Component Value Date   WBC 3.8 02/25/2023   HGB 12.1 (L) 02/25/2023   HCT 39.7 02/25/2023   MCV 70 (L) 02/25/2023   PLT 221 02/25/2023    Lab Results  Component Value Date   CREATININE 1.38 (H) 02/25/2023    No results found for: "PSA"  No results found for: "TESTOSTERONE"  Lab Results  Component Value Date   HGBA1C 6.2 (H) 02/25/2023    Urinalysis    Component Value Date/Time   COLORURINE AMBER (A) 02/03/2022 1531   APPEARANCEUR Clear 04/06/2022 0956   LABSPEC 1.019 02/03/2022 1531   PHURINE 5.0 02/03/2022 1531   GLUCOSEU Negative 04/06/2022 0956   HGBUR NEGATIVE 02/03/2022  1531   BILIRUBINUR Negative 04/06/2022 0956   KETONESUR NEGATIVE 02/03/2022 1531   PROTEINUR Negative 04/06/2022 0956   PROTEINUR NEGATIVE 02/03/2022 1531   NITRITE Negative 04/06/2022 0956   NITRITE NEGATIVE 02/03/2022 1531   LEUKOCYTESUR Negative 04/06/2022 0956   LEUKOCYTESUR NEGATIVE 02/03/2022 1531    Lab Results  Component Value Date   LABMICR 20.1 02/25/2023   WBCUA None seen 09/23/2021   LABEPIT None seen 09/23/2021   BACTERIA None seen 09/23/2021    Pertinent Imaging:  No results found for this or any previous visit.  No results found for this or any previous visit.  No results found for this or any previous visit.  No results found for this or any previous visit.  No results found for this or any previous visit.  No valid procedures specified. No results found for this or any previous visit.  No results found for this or any previous visit.   Assessment & Plan:    1. Benign prostatic hyperplasia with urinary obstruction -Continue uroxatral 10mg  qhs  2. OAB (overactive bladder) -continue vesicare 5mg  and mirabegron 50mg  daily  3. Nocturia -continue uroxatral 10mg  qhs   No follow-ups on file.  Wilkie Aye, MD  [facility] [place]

## 2023-04-06 ENCOUNTER — Encounter: Payer: Self-pay | Admitting: Urology

## 2023-04-06 LAB — PSA: Prostate Specific Ag, Serum: 0.6 ng/mL (ref 0.0–4.0)

## 2023-04-06 NOTE — Patient Instructions (Signed)

## 2023-04-06 NOTE — Progress Notes (Signed)
Letter sent.

## 2023-04-07 ENCOUNTER — Ambulatory Visit (HOSPITAL_COMMUNITY): Payer: Medicare PPO | Admitting: Physical Therapy

## 2023-04-07 DIAGNOSIS — M6281 Muscle weakness (generalized): Secondary | ICD-10-CM

## 2023-04-07 DIAGNOSIS — M5416 Radiculopathy, lumbar region: Secondary | ICD-10-CM | POA: Diagnosis not present

## 2023-04-07 NOTE — Therapy (Signed)
OUTPATIENT PHYSICAL THERAPY THORACOLUMBAR Treatment     Patient Name: Joseph [name] MRN: [id number] DOB:April 04, 1946, 77 y.o., male Today's Date: 04/07/2023  END OF SESSION:   PT End of Session - 04/07/23 0957     Visit Number 14    Number of Visits 17    Date for PT Re-Evaluation 05/06/23    Authorization Type humana; 8 additional visits from 9/6-10/4    Authorization Time Period 9/6-10/24 12 visits    Authorization - Visit Number 5    Authorization - Number of Visits 12    Progress Note Due on Visit 17    PT Start Time 0952    PT Stop Time 1018    PT Time Calculation (min) 26 min    Activity Tolerance Patient tolerated treatment well    Behavior During Therapy [facility] for tasks assessed/performed                    Past Medical History:  Diagnosis Date   1st degree AV block    Atrial fibrillation (HCC)    Diastolic dysfunction 4/13   grade 1   Hypertension    New onset atrial fibrillation (HCC) 06/2015   RBBB    Sinus bradycardia    Stroke (HCC)    no defecits, pt denies having a stroke, says Dr Tresa Endo diagnosed this   Past Surgical History:  Procedure Laterality Date   BIV PACEMAKER INSERTION CRT-P N/A 01/26/2022   Procedure: BIV PACEMAKER INSERTION CRT-P;  Surgeon: Marinus Maw, MD;  Location: [facility] INVASIVE CV LAB;  Service: Cardiovascular;  Laterality: N/A;   COLONOSCOPY     COLONOSCOPY  12/30/2011   Procedure: COLONOSCOPY;  Surgeon: Corbin Ade, MD;  [facility];  Service: Endoscopy;  Laterality: N/A;  1:45PM   COLONOSCOPY N/A 04/19/2019   Procedure: COLONOSCOPY;  Surgeon: Corbin Ade, MD;  [facility];  Service: Endoscopy;  Laterality: N/A;  2:00pm   EXCISION MASS LOWER EXTREMETIES Left 05/12/2018   Procedure: Left foot plantar fibroma excision;  Surgeon: Toni Arthurs, MD;  Location: [facility];  Service: Orthopedics;  Laterality: Left;   FOOT SURGERY Left    LEFT HEART CATH AND CORONARY ANGIOGRAPHY N/A  01/26/2022   Procedure: LEFT HEART CATH AND CORONARY ANGIOGRAPHY;  Surgeon: Lennette Bihari, MD;  Location: [facility] LAB;  Service: Cardiovascular;  Laterality: N/A;   SPERMATOCELECTOMY Right 11/13/2019   Procedure: EXCISION OF SPERMATIC CORD CYST;  Surgeon: Malen Gauze, MD;  Location: [facility];  Service: Urology;  Laterality: Right;   VASECTOMY  11/13/2019   Procedure: VASECTOMY;  Surgeon: Malen Gauze, MD;  Location: [facility];  Service: Urology;;   Patient Active Problem List   Diagnosis Date Noted   AKI (acute kidney injury) (HCC) 02/03/2022   NICM (nonischemic cardiomyopathy) (HCC) 02/03/2022   Sinus node dysfunction (HCC)    Atrial flutter (HCC) 01/23/2022   OAB (overactive bladder) 12/27/2020   Nocturia 01/10/2020   Benign prostatic hyperplasia with urinary obstruction 01/10/2020   Non-recurrent unilateral inguinal hernia without obstruction or gangrene    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   History of adenomatous polyp of colon 02/27/2019   Paroxysmal atrial fibrillation (HCC) 07/18/2015   Bradycardia 06/29/2015   Near syncope    Atrial fibrillation with rapid ventricular response (HCC) 06/27/2015   Left ventricular diastolic dysfunction, NYHA class 3 10/26/2014   Kidney cysts 04/04/2013   HTN (hypertension) 03/31/2013    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 03/31/2013   Diastolic dysfunction- grade 2- echo 06/28/15 03/31/2013   RBBB 03/31/2013   AV block, 1st degree 03/31/2013   Sinus bradycardia 03/31/2013   Hematochezia 12/29/2011    PCP: Gareth Morgan  REFERRING PROVIDER: Gareth Morgan  REFERRING DIAG: Low back pain   Rationale for Evaluation and Treatment: Rehabilitation  THERAPY DIAG:  Lumbar radiculopathy Muscle weakness   ONSET DATE: 07/27/22                                                                                                                                                                                             SUBJECIVE STATEMENT:   Pt  states he didn't know he existed until he woke up this morning.  Pt a few minutes late for appt today.  Reports his pain is in bil gluteal cleft region. 7/10 pain today.   PERTINENT HISTORY:  CVA, AKI  PAIN:  Are you having pain? Yes: NPRS scale: 7/10;  Pain location: buttock, gluteal cleft region bilaterally Pain description: aggravation  Aggravating factors: mornings  Relieving factors: walk around  PRECAUTIONS: None     WEIGHT BEARING RESTRICTIONS: No  FALLS:  Has patient fallen in last 6 months? No  LIVING ENVIRONMENT: Lives with: lives with their family Lives in: House/apartment Stairs: Yes: Internal: 12 steps; on right going up and External: 4 steps; on right going up; stairs does not bother the buttock pain  Has following equipment at home: None  OCCUPATION: retired  PLOF: Independent  PATIENT GOALS: less pain   OBJECTIVE:   PATIENT SURVEYS:  Difficult as pt has slight dementia, wife assists with questions.    COGNITION: Overall cognitive status: Within functional limits for tasks assessed       MUSCLE LENGTH: Hamstrings: Right 155 deg; Left 155 deg   POSTURE: No Significant postural limitations    LUMBAR ROM:   AROM eval  Flexion Fingers to toes   Extension 20  Right lateral flexion   Left lateral flexion   Right rotation   Left rotation    (Blank rows = not tested)  LOWER EXTREMITY ROM:   [facility]    LOWER EXTREMITY MMT:    MMT Right eval Left eval Right 03/19/23 Left  03/19/23  Hip flexion 4+ 5 5 5   Hip extension 3 3 3+ 3  Hip abduction 3+ 4+ 4- 4+  Hip adduction      Hip internal rotation      Hip external rotation      Knee flexion 4- 5 5 5   Knee extension 5 5    Ankle dorsiflexion 5 5  Ankle plantarflexion      Ankle inversion      Ankle eversion       (Blank rows = not tested)  FUNCTIONAL TESTS: Eval 30 seconds chair stand test:   6 , ( 8 is poor for pt age and sex, 104 is below average) 2 minute walk test:  339 ft pt  begins to drag his Rt foot after a minute.   Single leg stance: RT: 6"  , LT 6"   FUNCTIONAL TESTS: 03/19/23: 30 seconds chair stand test:   9 STS in 30" no HHA ( 8 is poor for pt age and sex, 72 is below average) 2 minute walk test:  3102ft begins to drag Rt foot after a 75" Single leg stance: SLS Rt 14", Lt 9"   TODAY'S TREATMENT:                                                                                                                              DATE:  04/07/23: Standing: Heel raise 20x Hip abduction 2X10 Hip extension 2X10 Sitting:  Sit to stand 10x  03/30/23: Sit to stand 15x Heel raise 20x Toe raise 15x Hip excursion 5x Squat 15x Seated piriformis stretch 3x 30" SLS LT 7", Rt 16" Tandem stance 2x 30" Vector stance 3x 5"  03/24/23: Standing: Heel raise x 15 Squat x 15  Single leg stance x 5 B  Hip excursions x 3  Side step in squatted position x 2  Sit to stand x 15 Piriformis stretch x 3  Supine: Knee to chest x 3 for 30" each Bridge x 15. Prone: B UE extension x 10  Press up x 5  Prone hip extension x 10 B   03/22/23 Prone: POE x 2 minutes Heel squeeze x 10  Prone hip extension x 10 Supine : Bridge x 10  knee to chest stretch single and double 3 x 30" each Active hamstring stretch x 3 B Piriformis stretch x 3 figure 4 as well as bringing knee to opposite shoulder Sitting piriformis stretch x 3  Sit to stand x 15  Squat  x10  03/19/23: 358ft no AD 30 seconds chair stand test:   9 STS in 30" no HHA MMT see above Reviewed goals  Seated: Controlled STS no HHA Supine: Bridge 10x + Prone: Hip extension 10x Standing: SLS Rt 14", Lt 9" Tandem stance 2x 30" each Sidestep inside //bars with minimal HHA 3RT (cueing to reduce ER)   03/11/23: Prone:  POE x 5" x 10 Supine:  Passive sciatic nerve glide x 4' on B Seated  piriformis stretch x 30" x 3 Hamstring stretch x 30" x 3 Standing:  Lumbar ext x 10 x 2  Abdominal isometrics x 3" x  10  03/05/23: Standing: Heel raises x 15 Squat x 10 Sit to stand x 10 Passive hamstring stretch on 12" box 30" x 3 B Hip excursions x 3 Marching x  5  Supine : Bridge x 15 LTR x 10  Figure 4 piriformis stretch    PATIENT EDUCATION:  Education details: HEP Person educated: Patient Education method: Chief Technology Officer Education comprehension: verbalized understanding  HOME EXERCISE PROGRAM: Access Code: [facility] URL: [email or web address] 03/19/23:  Prone hip extension  STS controlled with no HHA  03/11/23 (removed child's pose and knee to chest) - Seated Piriformis Stretch  - 2 x daily - 7 x weekly - 1 sets - 3 reps - 30" hold - Prone Press Up On Elbows  - 1-2 x daily - 7 x weekly - 1 sets - 10 reps - 5 hold - Standing Lumbar Extension  - 1-2 x daily - 7 x weekly - 2 sets - 10 reps  02/26/23: childs pose  02/17/23- sitting piriformis stretch x 30"  3x each  7/30/2- Supine Lower Trunk Rotation  - 2 x daily - 7 x weekly - 1 sets - 10 reps - 5" hold4  Date: 02/04/2023 Prepared by: Virgina Organ  Exercises - Supine Bridge  - 2 x daily - 7 x weekly - 1 sets - 10 reps - 5" hold - Supine Hamstring Stretch  - 2 x daily - 7 x weekly - 1 sets - 3 reps - 30" hold - Supine Single Knee to Chest Stretch  - 2 x daily - 7 x weekly - 1 sets - 3 reps - 30" hold  ASSESSMENT:  CLINICAL IMPRESSION:  Pt with some confusion this morning, questioning rather therapist thought his shoes were too large.  Pt able to complete first 3 exercises and began sweating.  BP taken at 165/99 mmHg.  Pt admits to not taking his BP meds this morning.  Educated on importance of taking his meds, especially prior to exercise session.  Pt verbalized understanding.  Session stopped and instructed to take his meds when he got home.  Pt came back into clinic and front staff verbalized concern for confusion.  Wife was contacted and came to clinic to ensure his safety. Pt did make additional appt before  leaving (he is on the NS appt schedule of one at a time).     OBJECTIVE IMPAIRMENTS: decreased activity tolerance, decreased balance, decreased ROM, decreased strength, and pain.   ACTIVITY LIMITATIONS: carrying, lifting, standing, and locomotion level  PARTICIPATION LIMITATIONS: shopping and community activity  PERSONAL FACTORS: Time since onset of injury/illness/exacerbation and 1-2 comorbidities: AKI, CVA  are also affecting patient's functional outcome.   REHAB POTENTIAL: Good  CLINICAL DECISION MAKING: Stable/uncomplicated  EVALUATION COMPLEXITY: Low   GOALS: Goals reviewed with patient? No  SHORT TERM GOALS: Target date: 02/25/23  PT to be I in Hep in order to decrease his Rt buttock pain to a 5/10 Baseline:  03/19/23:  Reports compliance with HEP 2-3 times a week; reports pain scale average 5/10 in the morning and reduces throughout the day. Goal status: MET  2.  PT to be able to single leg stance on each foot for 15" to reduce risk of falling  Baseline: Single leg stance: SLS Rt 14", Lt 9" Goal status:  On-going  3.  Pt core and LE strength to be increased to allow pt to be able to come sit to stand 9 x in 30" Baseline: 03/19/23:  see MMT; 9 STS in 30" no HHA Goal status:   MET    LONG TERM GOALS: Target date: 03/18/23  PT to be I in Hep in order to decrease his Rt buttock pain to  a 2/10  Goal status:  On-going   2.   PT to be able to single leg stance on each foot for 30" to reduce risk of falling  Baseline:  Goal status:  On-going  3.   Pt core and LE strength to be increased to allow pt to be able to come sit to stand 11 x in 30" Baseline:  Goal status:  On-going   PLAN:  PT FREQUENCY: 2x/week  PT DURATION: 6 weeks  PLANNED INTERVENTIONS: Therapeutic exercises, Neuromuscular re-education, Balance training, Patient/Family education, Self Care, and Manual therapy.  PLAN FOR NEXT SESSION: Add bodycraft retro and sidestep gait.     Emeline Gins B,  PTA 04/07/2023, 10:23 AM  04/07/2023, 10:23 AM

## 2023-04-08 ENCOUNTER — Ambulatory Visit (HOSPITAL_COMMUNITY)
Admission: RE | Admit: 2023-04-08 | Discharge: 2023-04-08 | Disposition: A | Discharge status: Home / Self Care | Payer: Medicare PPO | Source: Ambulatory Visit | Attending: Nephrology | Admitting: Nephrology

## 2023-04-08 DIAGNOSIS — N1831 Chronic kidney disease, stage 3a: Secondary | ICD-10-CM | POA: Diagnosis present

## 2023-04-08 DIAGNOSIS — I129 Hypertensive chronic kidney disease with stage 1 through stage 4 chronic kidney disease, or unspecified chronic kidney disease: Secondary | ICD-10-CM | POA: Insufficient documentation

## 2023-04-08 DIAGNOSIS — I5022 Chronic systolic (congestive) heart failure: Secondary | ICD-10-CM | POA: Diagnosis present

## 2023-04-09 ENCOUNTER — Other Ambulatory Visit: Payer: Self-pay | Admitting: Family Medicine

## 2023-04-09 ENCOUNTER — Telehealth: Payer: Self-pay | Admitting: Family Medicine

## 2023-04-09 MED ORDER — APIXABAN 5 MG PO TABS: 5.0000 mg | ORAL_TABLET | Freq: Two times a day (BID) | ORAL | 1 refills | Status: DC

## 2023-04-09 NOTE — Telephone Encounter (Signed)
Patient wife calling says pt is needing a refill on Eliquis sent to [facility] on [address]  Thank you

## 2023-04-09 NOTE — Telephone Encounter (Signed)
Sent!

## 2023-04-12 ENCOUNTER — Ambulatory Visit (INDEPENDENT_AMBULATORY_CARE_PROVIDER_SITE_OTHER): Payer: Medicare PPO

## 2023-04-12 VITALS — Ht 70.0 in | Wt 214.0 lb

## 2023-04-12 DIAGNOSIS — Z0000 Encounter for general adult medical examination without abnormal findings: Secondary | ICD-10-CM

## 2023-04-12 NOTE — Progress Notes (Signed)
 Because this visit was a virtual/telehealth visit,  certain criteria was not obtained, such a blood pressure, CBG if applicable, and timed get up and go. Any medications not marked as "taking" were not mentioned during the medication reconciliation part of the visit. Any vitals not documented were not able to be obtained due to this being a telehealth visit or patient was unable to self-report a recent blood pressure reading due to a lack of equipment at home via telehealth. Vitals that have been documented are verbally provided by the patient.   Visit completed with the help from patients wife, Darel Hong Subjective:   Joseph [name] is a 77 y.o. male who presents for an Initial Medicare Annual Wellness Visit.  Visit Complete: Virtual  I connected with  Valda Favia on 04/12/23 by a audio enabled telemedicine application and verified that I am speaking with the correct person using two identifiers.  Patient Location: Home  Provider Location: Home Office  I discussed the limitations of evaluation and management by telemedicine. The patient expressed understanding and agreed to proceed.  Patient Medicare AWV questionnaire was completed by the patient on na; I have confirmed that all information answered by patient is correct and no changes since this date.  Cardiac Risk Factors include: advanced age (>36men, >59 women);dyslipidemia;hypertension;male gender;obesity (BMI >30kg/m2);sedentary lifestyle     Objective:    Today's Vitals   04/12/23 0912 04/12/23 0913  Weight: 214 lb (97.1 kg)   Height: 5\' 10"  (1.778 m)   PainSc:  2    Body mass index is 30.71 kg/m.     04/12/2023    9:18 AM 02/04/2023    1:47 PM 02/03/2022    6:00 PM 02/03/2022    2:43 PM 01/26/2022    1:00 PM 01/24/2022    6:00 AM 01/23/2022    6:23 PM  Advanced Directives  Does Patient Have a Medical Advance Directive? No No Yes No  Yes Yes  Type of Advance Directive   Living will;Healthcare Power of [employer] Power of [place];Living will    Does patient want to make changes to medical advance directive?   No - Patient declined No - Patient declined No - Patient declined  No - Patient declined  Copy of Healthcare Power of Attorney in Chart?   No - copy requested  No - copy requested    Would patient like information on creating a medical advance directive? No - Patient declined No - Patient declined         Current Medications (verified) Outpatient Encounter Medications as of 04/12/2023  Medication Sig   acetaminophen (TYLENOL) 325 MG tablet Take 2 tablets (650 mg total) by mouth every 6 (six) hours as needed for mild pain, fever or headache (or Fever >/= 101).   alfuzosin (UROXATRAL) 10 MG 24 hr tablet Take 1 tablet (10 mg total) by mouth at bedtime.   apixaban (ELIQUIS) 5 MG TABS tablet Take 1 tablet (5 mg total) by mouth 2 (two) times daily.   B Complex Vitamins (VITAMIN B COMPLEX) TABS Take 1 tablet by mouth daily.   carvedilol (COREG) 12.5 MG tablet TAKE 1 TABLET(12.5 MG) BY MOUTH TWICE DAILY   cholecalciferol (VITAMIN D3) 25 MCG (1000 UNIT) tablet Take 1 tablet (1,000 Units total) by mouth daily.   cloNIDine (CATAPRES) 0.1 MG tablet Take 1 tablet (0.1 mg total) by mouth 2 (two) times daily.   donepezil (ARICEPT) 5 MG tablet Take 5 mg by mouth daily.   doxazosin (  CARDURA) 4 MG tablet Take 4 mg by mouth at bedtime.   hydrochlorothiazide (HYDRODIURIL) 25 MG tablet Take 25 mg by mouth daily.   latanoprost (XALATAN) 0.005 % ophthalmic solution Place 1 drop into both eyes at bedtime.   levocetirizine (XYZAL) 5 MG tablet Take 1 tablet (5 mg total) by mouth every evening.   LORazepam (ATIVAN) 0.5 MG tablet Take 0.5 mg by mouth daily as needed for sleep.   mirabegron ER (MYRBETRIQ) 50 MG TB24 tablet Take 1 tablet (50 mg total) by mouth daily.   Misc Natural Products (PROSTATE HEALTH PO) Take 1 capsule by mouth daily. Super Beta Prostate   Multiple Vitamin (MULTIVITAMIN WITH MINERALS) TABS  tablet Take 1 tablet by mouth daily.   Polyethyl Glycol-Propyl Glycol (LUBRICANT EYE DROPS) 0.4-0.3 % SOLN Place 1 drop into both eyes 3 (three) times daily as needed (dry/irritated eyes.).   rosuvastatin (CRESTOR) 5 MG tablet Take 1 tablet (5 mg total) by mouth daily.   sacubitril-valsartan (ENTRESTO) 24-26 MG TAKE 1 TABLET BY MOUTH TWICE DAILY   sacubitril-valsartan (ENTRESTO) 49-51 MG Take 1 tablet by mouth 2 (two) times daily.   saw palmetto 500 MG capsule Take 500 mg by mouth every evening.   solifenacin (VESICARE) 5 MG tablet Take 1 tablet (5 mg total) by mouth at bedtime.   spironolactone (ALDACTONE) 25 MG tablet TAKE 1/2 TABLET BY MOUTH DAILY   Turmeric 500 MG CAPS Take 500 mg by mouth daily.   No facility-administered encounter medications on file as of 04/12/2023.    Allergies (verified) Patient has no known allergies.   History: Past Medical History:  Diagnosis Date   1st degree AV block    Atrial fibrillation (HCC)    Diastolic dysfunction 4/13   grade 1   Hypertension    New onset atrial fibrillation (HCC) 06/2015   RBBB    Sinus bradycardia    Stroke (HCC)    no defecits, pt denies having a stroke, says Dr Tresa Endo diagnosed this   Past Surgical History:  Procedure Laterality Date   BIV PACEMAKER INSERTION CRT-P N/A 01/26/2022   Procedure: BIV PACEMAKER INSERTION CRT-P;  Surgeon: Marinus Maw, MD;  Location: [facility] INVASIVE CV LAB;  Service: Cardiovascular;  Laterality: N/A;   COLONOSCOPY     COLONOSCOPY  12/30/2011   Procedure: COLONOSCOPY;  Surgeon: Corbin Ade, MD;  Location: [facility];  Service: Endoscopy;  Laterality: N/A;  1:45PM   COLONOSCOPY N/A 04/19/2019   Procedure: COLONOSCOPY;  Surgeon: Corbin Ade, MD;  Location: [facility];  Service: Endoscopy;  Laterality: N/A;  2:00pm   EXCISION MASS LOWER EXTREMETIES Left 05/12/2018   Procedure: Left foot plantar fibroma excision;  Surgeon: Toni Arthurs, MD;  Location: [facility];  Service:  Orthopedics;  Laterality: Left;   FOOT SURGERY Left    LEFT HEART CATH AND CORONARY ANGIOGRAPHY N/A 01/26/2022   Procedure: LEFT HEART CATH AND CORONARY ANGIOGRAPHY;  Surgeon: Lennette Bihari, MD;  Location: [facility] INVASIVE CV LAB;  Service: Cardiovascular;  Laterality: N/A;   SPERMATOCELECTOMY Right 11/13/2019   Procedure: EXCISION OF SPERMATIC CORD CYST;  Surgeon: Malen Gauze, MD;  Location: [facility];  Service: Urology;  Laterality: Right;   VASECTOMY  11/13/2019   Procedure: VASECTOMY;  Surgeon: Malen Gauze, MD;  Location: [facility];  Service: Urology;;   Family History  Problem Relation Age of Onset   Cancer Brother    CVA Mother    Hypertension Father  Colon cancer Neg Hx    Social History   Socioeconomic History   Marital status: Married    Spouse name: Not on file   Number of children: 5   Years of education: Not on file   Highest education level: Not on file  Occupational History   Not on file  Tobacco Use   Smoking status: Former    Current packs/day: 0.00    Types: Cigarettes    Quit date: 1996    Years since quitting: 28.7   Smokeless tobacco: Never   Tobacco comments:    quit about 40 yrs ago  Vaping Use   Vaping status: Never Used  Substance and Sexual Activity   Alcohol use: Yes    Alcohol/week: 0.0 standard drinks of alcohol    Comment: 6 pack of beer or less in a week   Drug use: No   Sexual activity: Not on file  Other Topics Concern   Not on file  Social History Narrative   Not on file   Social Determinants of Health   Financial Resource Strain: Low Risk  (04/12/2023)   Overall Financial Resource Strain (CARDIA)    Difficulty of Paying Living Expenses: Not hard at all  Food Insecurity: No Food Insecurity (04/12/2023)   Hunger Vital Sign    Worried About Running Out of Food in the Last Year: Never true    Ran Out of Food in the Last Year: Never true  Transportation Needs: No Transportation Needs (04/12/2023)   PRAPARE - Therapist, art (Medical): No    Lack of Transportation (Non-Medical): No  Physical Activity: Inactive (04/12/2023)   Exercise Vital Sign    Days of Exercise per Week: 0 days    Minutes of Exercise per Session: 0 min  Stress: No Stress Concern Present (04/12/2023)   [employer] of Occupational Health - Occupational Stress Questionnaire    Feeling of Stress : Not at all  Social Connections: Socially Integrated (04/12/2023)   Social Connection and Isolation Panel [NHANES]    Frequency of Communication with Friends and Family: More than three times a week    Frequency of Social Gatherings with Friends and Family: More than three times a week    Attends Religious Services: More than 4 times per year    Active Member of [employer] or Organizations: Yes    Attends Engineer, structural: More than 4 times per year    Marital Status: Married    Tobacco Counseling Counseling given: Yes Tobacco comments: quit about 40 yrs ago   Clinical Intake:  Pre-visit preparation completed: Yes  Pain : 0-10 Pain Score: 2  Pain Type: Chronic pain Pain Location: Buttocks Pain Orientation: Right, Left Pain Descriptors / Indicators: Constant Pain Onset: More than a month ago Pain Frequency: Constant     BMI - recorded: 30.71 Nutritional Status: BMI > 30  Obese Nutritional Risks: None Diabetes: No  How often do you need to have someone help you when you read instructions, pamphlets, or other written materials from your doctor or pharmacy?: 1 - Never  Interpreter Needed?: No  Information entered by ::  Corita Allinson, CMA   Activities of Daily Living    04/12/2023    9:16 AM  In your present state of health, do you have any difficulty performing the following activities:  Hearing? 0  Vision? 0  Difficulty concentrating or making decisions? 0  Walking or climbing stairs? 0  Dressing or bathing? 0  Doing errands, shopping? 0  Preparing Food and eating ? N  Using the Toilet? N   In the past six months, have you accidently leaked urine? N  Do you have problems with loss of bowel control? N  Managing your Medications? N  Managing your Finances? N  Housekeeping or managing your Housekeeping? N    Patient Care Team: Del Newman Nip, Tenna Child, FNP as PCP - General (Family Medicine) Lennette Bihari, MD as PCP - Cardiology (Cardiology) Corbin Ade, MD (Gastroenterology) Dr [facility], OD McKenzie, Mardene Celeste, MD as Consulting Physician (Urology) Randa Lynn, MD as Consulting Physician (Nephrology) Marinus Maw, MD as Consulting Physician (Cardiology)  Indicate any recent Medical Services you may have received from other than [facility] providers in the past year (date may be approximate).     Assessment:   This is a routine wellness examination for Joseph [name].  Hearing/Vision screen Hearing Screening - Comments:: Patient denies any hearing difficulties.   Vision Screening - Comments:: Wears rx glasses - up to date with routine eye exams with Dr. Desiree Lucy    Goals Addressed             This Visit's Progress    Patient Stated       Remain independent       Depression Screen    04/12/2023    9:21 AM 02/25/2023   10:37 AM  PHQ 2/9 Scores  PHQ - 2 Score 0 0  PHQ- 9 Score 10 1    Fall Risk    04/12/2023    9:18 AM 02/25/2023   10:37 AM 09/14/2019   10:52 AM  Fall Risk   Falls in the past year? 0 1 0  Number falls in past yr: 0 0   Injury with Fall? 0 1   Risk for fall due to : No Fall Risks    Follow up Falls prevention discussed      MEDICARE RISK AT HOME: Medicare Risk at Home Any stairs in or around the home?: No Home free of loose throw rugs in walkways, pet beds, electrical cords, etc?: Yes Adequate lighting in your home to reduce risk of falls?: Yes Life alert?: No Use of a cane, walker or w/c?: No Grab bars in the bathroom?: Yes Shower chair or bench in shower?: No Elevated toilet seat or a  handicapped toilet?: No  TIMED UP AND GO:  Was the test performed? No    Cognitive Function:        04/12/2023    9:19 AM  6CIT Screen  What Year? 4 points  What month? 0 points  What time? 3 points  Count back from 20 0 points  Months in reverse 0 points  Repeat phrase 0 points  Total Score 7 points    Immunizations  There is no immunization history on file for this patient.  TDAP status: Due, Education has been provided regarding the importance of this vaccine. Advised may receive this vaccine at local pharmacy or Health Dept. Aware to provide a copy of the vaccination record if obtained from local pharmacy or Health Dept. Verbalized acceptance and understanding.  Flu Vaccine status: Due, Education has been provided regarding the importance of this vaccine. Advised may receive this vaccine at local pharmacy or Health Dept. Aware to provide a copy of the vaccination record if obtained from local pharmacy or Health Dept. Verbalized acceptance and understanding.  Pneumococcal vaccine status: Due, Education has been provided regarding the  importance of this vaccine. Advised may receive this vaccine at local pharmacy or Health Dept. Aware to provide a copy of the vaccination record if obtained from local pharmacy or Health Dept. Verbalized acceptance and understanding.  Covid-19 vaccine status: Information provided on how to obtain vaccines.   Qualifies for Shingles Vaccine? Yes   Zostavax completed No   Shingrix Completed?: No.    Education has been provided regarding the importance of this vaccine. Patient has been advised to call insurance company to determine out of pocket expense if they have not yet received this vaccine. Advised may also receive vaccine at local pharmacy or Health Dept. Verbalized acceptance and understanding.  Screening Tests Health Maintenance  Topic Date Due   Medicare Annual Wellness (AWV)  Never done   COVID-19 Vaccine (1) Never done   DTaP/Tdap/Td  (1 - Tdap) Never done   Zoster Vaccines- Shingrix (1 of 2) Never done   Pneumonia Vaccine 47+ Years old (1 of 1 - PCV) Never done   INFLUENZA VACCINE  Never done   Hepatitis C Screening  Completed   HPV VACCINES  Aged Out   Colonoscopy  Discontinued    Health Maintenance  Health Maintenance Due  Topic Date Due   Medicare Annual Wellness (AWV)  Never done   COVID-19 Vaccine (1) Never done   DTaP/Tdap/Td (1 - Tdap) Never done   Zoster Vaccines- Shingrix (1 of 2) Never done   Pneumonia Vaccine 56+ Years old (1 of 1 - PCV) Never done   INFLUENZA VACCINE  Never done    Colorectal cancer screening: No longer required.   Lung Cancer Screening: (Low Dose CT Chest recommended if Age 70-80 years, 20 pack-year currently smoking OR have quit w/in 15years.) does not qualify.     Additional Screening:  Hepatitis C Screening: does not qualify; Completed 02/28/2023  Vision Screening: Recommended annual ophthalmology exams for early detection of glaucoma and other disorders of the eye. Is the patient up to date with their annual eye exam?  Yes  Who is the provider or what is the name of the office in which the patient attends annual eye exams? Dr. Desiree Lucy If pt is not established with a provider, would they like to be referred to a provider to establish care? No .   Dental Screening: Recommended annual dental exams for proper oral hygiene  Diabetic Foot Exam: n/a  Community Resource Referral / Chronic Care Management: CRR required this visit?  No   CCM required this visit?  No    Plan:     I have personally reviewed and noted the following in the patient's chart:   Medical and social history Use of alcohol, tobacco or illicit drugs  Current medications and supplements including opioid prescriptions. Patient is not currently taking opioid prescriptions. Functional ability and status Nutritional status Physical activity Advanced directives List of other  physicians Hospitalizations, surgeries, and ER visits in previous 12 months Vitals Screenings to include cognitive, depression, and falls Referrals and appointments  In addition, I have reviewed and discussed with patient certain preventive protocols, quality metrics, and best practice recommendations. A written personalized care plan for preventive services as well as general preventive health recommendations were provided to patient.     Jordan Hawks Treniyah Lynn, CMA   04/12/2023   After Visit Summary: (Mail) Due to this being a telephonic visit, the after visit summary with patients personalized plan was offered to patient via mail

## 2023-04-12 NOTE — Patient Instructions (Signed)
Joseph [name] , Thank you for taking time to come for your Medicare Wellness Visit. I appreciate your ongoing commitment to your health goals. Please review the following plan we discussed and let me know if I can assist you in the future.   Referrals/Orders/Follow-Ups/Clinician Recommendations:  Next Medicare Annual Wellness Visit: July 19, 2024 at 8:00 am virtual visit   This is a list of the screening recommended for you and due dates:  Health Maintenance  Topic Date Due   COVID-19 Vaccine (1) Never done   DTaP/Tdap/Td vaccine (1 - Tdap) Never done   Zoster (Shingles) Vaccine (1 of 2) Never done   Pneumonia Vaccine (1 of 1 - PCV) Never done   Flu Shot  Never done   Medicare Annual Wellness Visit  04/11/2024   Hepatitis C Screening  Completed   HPV Vaccine  Aged Out   Colon Cancer Screening  Discontinued    Advanced directives: (ACP Link)Information on Advanced Care Planning can be found at [facility] Advance Health Care Directives Advance Health Care Directives ([email or web address]   Next Medicare Annual Wellness Visit scheduled for next year: Yes  Preventive Care 65 Years and Older, Male Preventive care refers to lifestyle choices and visits with your health care provider that can promote health and wellness. Preventive care visits are also called wellness exams. What can I expect for my preventive care visit? Counseling During your preventive care visit, your health care provider may ask about your: Medical history, including: Past medical problems. Family medical history. History of falls. Current health, including: Emotional well-being. Home life and relationship well-being. Sexual activity. Memory and ability to understand (cognition). Lifestyle, including: Alcohol, nicotine or tobacco, and drug use. Access to firearms. Diet, exercise, and sleep habits. Work and work Astronomer. Sunscreen use. Safety issues such as seatbelt and bike helmet  use. Physical exam Your health care provider will check your: Height and weight. These may be used to calculate your BMI (body mass index). BMI is a measurement that tells if you are at a healthy weight. Waist circumference. This measures the distance around your waistline. This measurement also tells if you are at a healthy weight and may help predict your risk of certain diseases, such as type 2 diabetes and high blood pressure. Heart rate and blood pressure. Body temperature. Skin for abnormal spots. What immunizations do I need?  Vaccines are usually given at various ages, according to a schedule. Your health care provider will recommend vaccines for you based on your age, medical history, and lifestyle or other factors, such as travel or where you work. What tests do I need? Screening Your health care provider may recommend screening tests for certain conditions. This may include: Lipid and cholesterol levels. Diabetes screening. This is done by checking your blood sugar (glucose) after you have not eaten for a while (fasting). Hepatitis C test. Hepatitis B test. HIV (human immunodeficiency virus) test. STI (sexually transmitted infection) testing, if you are at risk. Lung cancer screening. Colorectal cancer screening. Prostate cancer screening. Abdominal aortic aneurysm (AAA) screening. You may need this if you are a current or former smoker. Talk with your health care provider about your test results, treatment options, and if necessary, the need for more tests. Follow these instructions at home: Eating and drinking  Eat a diet that includes fresh fruits and vegetables, whole grains, lean protein, and low-fat dairy products. Limit your intake of foods with high amounts of sugar, saturated fats, and salt. Take vitamin  and mineral supplements as recommended by your health care provider. Do not drink alcohol if your health care provider tells you not to drink. If you drink  alcohol: Limit how much you have to 0-2 drinks a day. Know how much alcohol is in your drink. In the [place] one drink equals one 12 oz bottle of beer (355 mL), one 5 oz glass of wine (148 mL), or one 1 oz glass of hard liquor (44 mL). Lifestyle Brush your teeth every morning and night with fluoride toothpaste. Floss one time each day. Exercise for at least 30 minutes 5 or more days each week. Do not use any products that contain nicotine or tobacco. These products include cigarettes, chewing tobacco, and vaping devices, such as e-cigarettes. If you need help quitting, ask your health care provider. Do not use drugs. If you are sexually active, practice safe sex. Use a condom or other form of protection to prevent STIs. Take aspirin only as told by your health care provider. Make sure that you understand how much to take and what form to take. Work with your health care provider to find out whether it is safe and beneficial for you to take aspirin daily. Ask your health care provider if you need to take a cholesterol-lowering medicine (statin). Find healthy ways to manage stress, such as: Meditation, yoga, or listening to music. Journaling. Talking to a trusted person. Spending time with friends and family. Safety Always wear your seat belt while driving or riding in a vehicle. Do not drive: If you have been drinking alcohol. Do not ride with someone who has been drinking. When you are tired or distracted. While texting. If you have been using any mind-altering substances or drugs. Wear a helmet and other protective equipment during sports activities. If you have firearms in your house, make sure you follow all gun safety procedures. Minimize exposure to UV radiation to reduce your risk of skin cancer. What's next? Visit your health care provider once a year for an annual wellness visit. Ask your health care provider how often you should have your eyes and teeth checked. Stay up to date  on all vaccines. This information is not intended to replace advice given to you by your health care provider. Make sure you discuss any questions you have with your health care provider. Document Revised: 12/25/2020 Document Reviewed: 12/25/2020 Elsevier Patient Education  2024 [employer]. Understanding Your Risk for Falls Millions of people have serious injuries from falls each year. It is important to understand your risk of falling. Talk with your health care provider about your risk and what you can do to lower it. If you do have a serious fall, make sure to tell your provider. Falling once raises your risk of falling again. How can falls affect me? Serious injuries from falls are common. These include: Broken bones, such as hip fractures. Head injuries, such as traumatic brain injuries (TBI) or concussions. A fear of falling can cause you to avoid activities and stay at home. This can make your muscles weaker and raise your risk for a fall. What can increase my risk? There are a number of risk factors that increase your risk for falling. The more risk factors you have, the higher your risk of falling. Serious injuries from a fall happen most often to people who are older than 75 years old. Teenagers and young adults ages 66-29 are also at higher risk. Common risk factors include: Weakness in the lower body. Being generally  weak or confused due to long-term (chronic) illness. Dizziness or balance problems. Poor vision. Medicines that cause dizziness or drowsiness. These may include: Medicines for your blood pressure, heart, anxiety, insomnia, or swelling (edema). Pain medicines. Muscle relaxants. Other risk factors include: Drinking alcohol. Having had a fall in the past. Having foot pain or wearing improper footwear. Working at a dangerous job. Having any of the following in your home: Tripping hazards, such as floor clutter or loose rugs. Poor lighting. Pets. Having dementia  or memory loss. What actions can I take to lower my risk of falling?     Physical activity Stay physically fit. Do strength and balance exercises. Consider taking a regular class to build strength and balance. Yoga and tai chi are good options. Vision Have your eyes checked every year and your prescription for glasses or contacts updated as needed. Shoes and walking aids Wear non-skid shoes. Wear shoes that have rubber soles and low heels. Do not wear high heels. Do not walk around the house in socks or slippers. Use a cane or walker as told by your provider. Home safety Attach secure railings on both sides of your stairs. Install grab bars for your bathtub, shower, and toilet. Use a non-skid mat in your bathtub or shower. Attach bath mats securely with double-sided, non-slip rug tape. Use good lighting in all rooms. Keep a flashlight near your bed. Make sure there is a clear path from your bed to the bathroom. Use night-lights. Do not use throw rugs. Make sure all carpeting is taped or tacked down securely. Remove all clutter from walkways and stairways, including extension cords. Repair uneven or broken steps and floors. Avoid walking on icy or slippery surfaces. Walk on the grass instead of on icy or slick sidewalks. Use ice melter to get rid of ice on walkways in the winter. Use a cordless phone. Questions to ask your health care provider Can you help me check my risk for a fall? Do any of my medicines make me more likely to fall? Should I take a vitamin D supplement? What exercises can I do to improve my strength and balance? Should I make an appointment to have my vision checked? Do I need a bone density test to check for weak bones (osteoporosis)? Would it help to use a cane or a walker? Where to find more information Centers for Disease Control and Prevention, STEADI: TonerPromos.no Community-Based Fall Prevention Programs: TonerPromos.no [employer] on Aging:  BaseRingTones.pl Contact a health care provider if: You fall at home. You are afraid of falling at home. You feel weak, drowsy, or dizzy. This information is not intended to replace advice given to you by your health care provider. Make sure you discuss any questions you have with your health care provider. Document Revised: 03/02/2022 Document Reviewed: 03/02/2022 Elsevier Patient Education  2024 [employer].

## 2023-04-14 ENCOUNTER — Encounter (HOSPITAL_COMMUNITY): Payer: Medicare PPO

## 2023-04-16 ENCOUNTER — Encounter (HOSPITAL_COMMUNITY): Payer: Medicare PPO

## 2023-04-20 ENCOUNTER — Encounter (HOSPITAL_COMMUNITY): Payer: Medicare PPO

## 2023-04-22 ENCOUNTER — Emergency Department (HOSPITAL_COMMUNITY): Payer: Medicare PPO

## 2023-04-22 ENCOUNTER — Ambulatory Visit: Payer: Medicare PPO | Admitting: Family Medicine

## 2023-04-22 ENCOUNTER — Emergency Department (HOSPITAL_COMMUNITY)
Admission: EM | Admit: 2023-04-22 | Discharge: 2023-04-22 | Disposition: A | Discharge status: Home / Self Care | Payer: Medicare PPO | Attending: Emergency Medicine | Admitting: Emergency Medicine

## 2023-04-22 ENCOUNTER — Ambulatory Visit (HOSPITAL_COMMUNITY): Payer: Medicare PPO | Attending: Family Medicine | Admitting: Physical Therapy

## 2023-04-22 ENCOUNTER — Other Ambulatory Visit: Payer: Self-pay

## 2023-04-22 ENCOUNTER — Encounter (HOSPITAL_COMMUNITY): Payer: Self-pay

## 2023-04-22 ENCOUNTER — Encounter (HOSPITAL_COMMUNITY): Payer: Self-pay | Admitting: Physical Therapy

## 2023-04-22 ENCOUNTER — Telehealth (HOSPITAL_COMMUNITY): Payer: Self-pay | Admitting: Physical Therapy

## 2023-04-22 DIAGNOSIS — R4781 Slurred speech: Secondary | ICD-10-CM | POA: Diagnosis not present

## 2023-04-22 DIAGNOSIS — Z7901 Long term (current) use of anticoagulants: Secondary | ICD-10-CM | POA: Insufficient documentation

## 2023-04-22 DIAGNOSIS — R4701 Aphasia: Secondary | ICD-10-CM | POA: Insufficient documentation

## 2023-04-22 LAB — COMPREHENSIVE METABOLIC PANEL
ALT: 15 U/L (ref 0–44)
AST: 21 U/L (ref 15–41)
Albumin: 3.8 g/dL (ref 3.5–5.0)
Alkaline Phosphatase: 66 U/L (ref 38–126)
Anion gap: 8 (ref 5–15)
BUN: 25 mg/dL — ABNORMAL HIGH (ref 8–23)
CO2: 24 mmol/L (ref 22–32)
Calcium: 8.5 mg/dL — ABNORMAL LOW (ref 8.9–10.3)
Chloride: 100 mmol/L (ref 98–111)
Creatinine, Ser: 1.39 mg/dL — ABNORMAL HIGH (ref 0.61–1.24)
GFR, Estimated: 53 mL/min — ABNORMAL LOW (ref >60)
Glucose, Bld: 100 mg/dL — ABNORMAL HIGH (ref 70–99)
Potassium: 4 mmol/L (ref 3.5–5.1)
Sodium: 132 mmol/L — ABNORMAL LOW (ref 135–145)
Total Bilirubin: 1.1 mg/dL (ref 0.3–1.2)
Total Protein: 6.7 g/dL (ref 6.5–8.1)

## 2023-04-22 LAB — CBC WITH DIFFERENTIAL/PLATELET
Abs Immature Granulocytes: 0.01 10*3/uL (ref 0.00–0.07)
Basophils Absolute: 0 10*3/uL (ref 0.0–0.1)
Basophils Relative: 1 %
Eosinophils Absolute: 0.1 10*3/uL (ref 0.0–0.5)
Eosinophils Relative: 2 %
HCT: 38.6 % — ABNORMAL LOW (ref 39.0–52.0)
Hemoglobin: 11.7 g/dL — ABNORMAL LOW (ref 13.0–17.0)
Immature Granulocytes: 0 %
Lymphocytes Relative: 32 %
Lymphs Abs: 1.5 10*3/uL (ref 0.7–4.0)
MCH: 21.6 pg — ABNORMAL LOW (ref 26.0–34.0)
MCHC: 30.3 g/dL (ref 30.0–36.0)
MCV: 71.3 fL — ABNORMAL LOW (ref 80.0–100.0)
Monocytes Absolute: 0.5 10*3/uL (ref 0.1–1.0)
Monocytes Relative: 10 %
Neutro Abs: 2.5 10*3/uL (ref 1.7–7.7)
Neutrophils Relative %: 55 %
Platelets: 223 10*3/uL (ref 150–400)
RBC: 5.41 MIL/uL (ref 4.22–5.81)
RDW: 15 % (ref 11.5–15.5)
WBC: 4.5 10*3/uL (ref 4.0–10.5)
nRBC: 0 % (ref 0.0–0.2)

## 2023-04-22 LAB — URINALYSIS, ROUTINE W REFLEX MICROSCOPIC
Bilirubin Urine: NEGATIVE
Glucose, UA: NEGATIVE mg/dL
Hgb urine dipstick: NEGATIVE
Ketones, ur: NEGATIVE mg/dL
Leukocytes,Ua: NEGATIVE
Nitrite: NEGATIVE
Protein, ur: NEGATIVE mg/dL
Specific Gravity, Urine: 1.017 (ref 1.005–1.030)
pH: 5 (ref 5.0–8.0)

## 2023-04-22 NOTE — ED Notes (Signed)
Discharge instructions reviewed with patient and spouse.   Opportunity for questions and concerns provided.   Encouraged to follow up with neurology for follow up MRI.   Displays no signs of distress on departure.

## 2023-04-22 NOTE — Telephone Encounter (Signed)
  3rd No Show:  Pt wife answered phone, stated that the pt was still in the bed.  Therapist offered 4:00 or 4:45, however pt has a 3:45 MD appointment and will not be able to come.  Per policy this is the third no show and PT will be discharged.  Virgina Organ, PT CLT [phone]

## 2023-04-22 NOTE — Discharge Instructions (Signed)
You were seen in the emergency department for evaluation of difficulty with your speech.  You had lab work and a CAT scan that showed multiple old strokes but no definite new stroke.  You will need an MRI but this is complicated because of your pacemaker.  We have put a referral in for you to follow-up with neurology.  They will help arrange your MRI.  Continue your regular medications and return to the hospital if any worsening or concerning symptoms

## 2023-04-22 NOTE — ED Triage Notes (Signed)
Pt family reports pt has been having problems finding words and slurring his words x 2 weeks.

## 2023-04-22 NOTE — ED Provider Notes (Signed)
[place] EMERGENCY DEPARTMENT AT [facility] Provider Note   CSN: [id number] Arrival date & time: 04/22/23  1514     History  Chief Complaint  Patient presents with   speech problems    Joseph [name] is a 77 y.o. male.  He is here for evaluation of slurred speech and word finding difficulties that have been going on for a week and a half for 2 weeks.  Is happening every day.  Wife also thinks that Metabet a little facial droop although she cannot remember which side.  He denies any headache blurry vision double vision numbness weakness difficulty with ambulation.  He said he knows what he wants to say he just has trouble getting the words out of his mouth.  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  No recent falls.  The history is provided by the patient and the spouse.  Neurologic Problem This is a new problem. The current episode started more than 1 week ago. The problem occurs constantly. The problem has not changed since onset.Pertinent negatives include no chest pain, no abdominal pain, no headaches and no shortness of breath. Exacerbated by: talking. Nothing relieves the symptoms. He has tried rest for the symptoms. The treatment provided no relief.       Home Medications Prior to Admission medications   Medication Sig Start Date End Date Taking? Authorizing Provider  donepezil (ARICEPT) 10 MG tablet Take 10 mg by mouth daily. 04/04/23  Yes [provider]  acetaminophen (TYLENOL) 325 MG tablet Take 2 tablets (650 mg total) by mouth every 6 (six) hours as needed for mild pain, fever or headache (or Fever >/= 101). 02/04/22   Emokpae, Courage, MD  alfuzosin (UROXATRAL) 10 MG 24 hr tablet Take 1 tablet (10 mg total) by mouth at bedtime. 04/05/23   McKenzie, Mardene Celeste, MD  apixaban (ELIQUIS) 5 MG TABS tablet Take 1 tablet (5 mg total) by mouth 2 (two) times daily. 04/09/23   Del Nigel Berthold, FNP  B Complex Vitamins  (VITAMIN B COMPLEX) TABS Take 1 tablet by mouth daily.    [provider]  carvedilol (COREG) 12.5 MG tablet TAKE 1 TABLET(12.5 MG) BY MOUTH TWICE DAILY 04/05/23   Lennette Bihari, MD  cholecalciferol (VITAMIN D3) 25 MCG (1000 UNIT) tablet Take 1 tablet (1,000 Units total) by mouth daily. 02/28/23   Del Nigel Berthold, FNP  cloNIDine (CATAPRES) 0.1 MG tablet Take 1 tablet (0.1 mg total) by mouth 2 (two) times daily. 02/04/22 04/12/23  Shon Hale, MD  donepezil (ARICEPT) 5 MG tablet Take 5 mg by mouth daily. 04/07/22   [provider]  doxazosin (CARDURA) 4 MG tablet Take 4 mg by mouth at bedtime. 01/27/23   [provider]  hydrochlorothiazide (HYDRODIURIL) 25 MG tablet Take 25 mg by mouth daily. 01/28/22   [provider]  latanoprost (XALATAN) 0.005 % ophthalmic solution Place 1 drop into both eyes at bedtime. 02/04/22   Shon Hale, MD  levocetirizine (XYZAL) 5 MG tablet Take 1 tablet (5 mg total) by mouth every evening. 02/25/23   Del Nigel Berthold, FNP  LORazepam (ATIVAN) 0.5 MG tablet Take 0.5 mg by mouth daily as needed for sleep.    [provider]  mirabegron ER (MYRBETRIQ) 50 MG TB24 tablet Take 1 tablet (50 mg total) by mouth daily. 04/05/23   McKenzie, Mardene Celeste, MD  Misc Natural Products (PROSTATE HEALTH PO) Take 1 capsule by  mouth daily. Super Beta Prostate    [provider]  Multiple Vitamin (MULTIVITAMIN WITH MINERALS) TABS tablet Take 1 tablet by mouth daily.    [provider]  Polyethyl Glycol-Propyl Glycol (LUBRICANT EYE DROPS) 0.4-0.3 % SOLN Place 1 drop into both eyes 3 (three) times daily as needed (dry/irritated eyes.).    [provider]  rosuvastatin (CRESTOR) 5 MG tablet Take 1 tablet (5 mg total) by mouth daily. 02/28/23   Del Nigel Berthold, FNP  sacubitril-valsartan (ENTRESTO) 24-26 MG TAKE 1 TABLET BY MOUTH TWICE DAILY 04/05/23   Lennette Bihari, MD  sacubitril-valsartan  (ENTRESTO) 49-51 MG Take 1 tablet by mouth 2 (two) times daily. 04/03/22   Cannon Kettle, PA-C  saw palmetto 500 MG capsule Take 500 mg by mouth every evening.    [provider]  solifenacin (VESICARE) 5 MG tablet Take 1 tablet (5 mg total) by mouth at bedtime. 04/05/23   Malen Gauze, MD  spironolactone (ALDACTONE) 25 MG tablet TAKE 1/2 TABLET BY MOUTH DAILY 05/18/22   Lennette Bihari, MD  Turmeric 500 MG CAPS Take 500 mg by mouth daily.    [provider]      Allergies    Patient has no known allergies.    Review of Systems   Review of Systems  Constitutional:  Negative for fever.  HENT:  Negative for sore throat.   Eyes:  Negative for visual disturbance.  Respiratory:  Positive for cough. Negative for shortness of breath.   Cardiovascular:  Negative for chest pain.  Gastrointestinal:  Negative for abdominal pain.  Genitourinary:  Negative for dysuria.  Musculoskeletal:  Negative for neck pain.  Skin:  Negative for rash.  Neurological:  Positive for speech difficulty. Negative for syncope, weakness, numbness and headaches.    Physical Exam Updated Vital Signs BP (!) 159/79 (BP Location: Right Arm)   Pulse 60   Temp 98.3 F (36.8 C) (Oral)   Resp 16   Ht 5\' 10"  (1.778 m)   Wt 97.1 kg   SpO2 98%   BMI 30.71 kg/m  Physical Exam Vitals and nursing note reviewed.  Constitutional:      General: He is not in acute distress.    Appearance: Normal appearance. He is well-developed.  HENT:     Head: Normocephalic and atraumatic.  Eyes:     Conjunctiva/sclera: Conjunctivae normal.  Cardiovascular:     Rate and Rhythm: Normal rate and regular rhythm.     Heart sounds: No murmur heard. Pulmonary:     Effort: Pulmonary effort is normal. No respiratory distress.     Breath sounds: Normal breath sounds.  Abdominal:     Palpations: Abdomen is soft.     Tenderness: There is no abdominal tenderness.  Musculoskeletal:        General: No swelling.      Cervical back: Neck supple.  Skin:    General: Skin is warm and dry.     Capillary Refill: Capillary refill takes less than 2 seconds.  Neurological:     Mental Status: He is alert and oriented to person, place, and time.     Cranial Nerves: Cranial nerve deficit present.     Sensory: No sensory deficit.     Motor: No weakness.     Gait: Gait normal.     Comments: Patient is awake and alert.  He has a little bit of slowness in his speech with some mixing up of some words.  He  has no other focal deficits and I do not appreciate any facial asymmetry.     ED Results / Procedures / Treatments   Labs (all labs ordered are listed, but only abnormal results are displayed) Labs Reviewed  CBC WITH DIFFERENTIAL/PLATELET - Abnormal; Notable for the following components:      Result Value   Hemoglobin 11.7 (*)    HCT 38.6 (*)    MCV 71.3 (*)    MCH 21.6 (*)    All other components within normal limits  COMPREHENSIVE METABOLIC PANEL - Abnormal; Notable for the following components:   Sodium 132 (*)    Glucose, Bld 100 (*)    BUN 25 (*)    Creatinine, Ser 1.39 (*)    Calcium 8.5 (*)    GFR, Estimated 53 (*)    All other components within normal limits  URINALYSIS, ROUTINE W REFLEX MICROSCOPIC    EKG EKG Interpretation Date/Time:  Thursday April 22 2023 15:57:46 EDT Ventricular Rate:  60 PR Interval:  207 QRS Duration:  205 QT Interval:  522 QTC Calculation: 522 R Axis:   164  Text Interpretation: Ventricular-paced rhythm No significant change since prior 7/23 Confirmed by Meridee Score [phone]) on 04/22/2023 4:07:17 PM  Radiology CT Head Wo Contrast  Result Date: 04/22/2023 CLINICAL DATA:  Acute neurologic deficit, slurred speech EXAM: CT HEAD WITHOUT CONTRAST TECHNIQUE: Contiguous axial images were obtained from the base of the skull through the vertex without intravenous contrast. RADIATION DOSE REDUCTION: This exam was performed according to the departmental  dose-optimization program which includes automated exposure control, adjustment of the mA and/or kV according to patient size and/or use of iterative reconstruction technique. COMPARISON:  None Available. FINDINGS: Brain: Normal anatomic configuration. Parenchymal volume loss is commensurate with the patient's age. Mild periventricular white matter changes are present likely reflecting the sequela of small vessel ischemia. Remote lacunar infarcts are noted within the centrum semiovale bilaterally, the right basal ganglia, the right periventricular white matter, and the thalami bilaterally. No abnormal intra or extra-axial mass lesion or fluid collection. No abnormal mass effect or midline shift. No evidence of acute intracranial hemorrhage or infarct. Ventricular size is normal. Cerebellum unremarkable. Vascular: No asymmetric hyperdense vasculature at the skull base. Skull: Intact Sinuses/Orbits: Paranasal sinuses are clear. Orbits are unremarkable. Other: Mastoid air cells and middle ear cavities are clear. IMPRESSION: 1. No acute intracranial hemorrhage or infarct. 2. Multiple remote lacunar infarcts as outlined above. 3. Mild senescent change. Electronically Signed   By: Helyn Numbers M.D.   On: 04/22/2023 18:55    Procedures Procedures    Medications Ordered in ED Medications - No data to display  ED Course/ Medical Decision Making/ A&P Clinical Course as of 04/23/23 0948  Thu Apr 22, 2023  1920 CT does not show any acute findings but does show remote lacunar infarcts.  I reviewed this with neurology Dr. Thad Ranger.  She felt that this would be appropriate for outpatient follow-up.  Will review with patient and sister. [MB]  1937 Further discussed with Dr. Thad Ranger.  She said he is already on max medical therapy with his Eliquis so outpatient follow-up would be the appropriate course.  I reviewed this with patient and he is comfortable plan for outpatient.  Have put a referral into [facility]  neurology.  He understands to return if any worsening or concerning symptoms. [MB]    Clinical Course User Index [MB] Terrilee Files, MD  Medical Decision Making Amount and/or Complexity of Data Reviewed Labs: ordered. Radiology: ordered.   This patient complains of alteration in speech; this involves an extensive number of treatment Options and is a complaint that carries with it a high risk of complications and morbidity. The differential includes stroke, seizure, mass, metabolic derangement, dehydration, infection  I ordered, reviewed and interpreted labs, which included CBC with normal white count, stable low hemoglobin, chemistries with some chronic CKD, urinalysis without signs of infection I ordered imaging studies which included head CT and I independently    visualized and interpreted imaging which showed multiple old lacunar strokes no acute Additional history obtained from patient's wife Previous records obtained and reviewed in epic no recent admissions I consulted neurology Dr. Thad Ranger and discussed lab and imaging findings and discussed disposition.  Cardiac monitoring reviewed, paced rhythm Social determinants considered, physically inactive Critical Interventions: None  After the interventions stated above, I reevaluated the patient and found patient otherwise stable no distress Admission and further testing considered, neurology felt that he is on maximal medical treatment at this time and is appropriate for outpatient evaluation.  Patient comfortable plan.  Have put in a referral for neurology.  Return instructions discussed.         Final Clinical Impression(s) / ED Diagnoses Final diagnoses:  Aphasia    Rx / DC Orders ED Discharge Orders     None         Terrilee Files, MD 04/23/23 [phone]

## 2023-04-22 NOTE — Therapy (Unsigned)
PHYSICAL THERAPY DISCHARGE SUMMARY  Visits from Start of Care: 14  Current functional level related to goals / functional outcomes: Unknown as pt did not show.  Last reassessment pt strength and ROM improved   Remaining deficits: pain   Education / Equipment: There ex   Patient agrees to discharge. Patient goals were partially met. Patient is being discharged due to  3 no shows. Virgina Organ, PT CLT [phone]

## 2023-04-27 ENCOUNTER — Ambulatory Visit (INDEPENDENT_AMBULATORY_CARE_PROVIDER_SITE_OTHER): Payer: Medicare PPO

## 2023-04-27 DIAGNOSIS — I495 Sick sinus syndrome: Secondary | ICD-10-CM | POA: Diagnosis not present

## 2023-04-28 LAB — CUP PACEART REMOTE DEVICE CHECK
Battery Remaining Longevity: 68 mo
Battery Remaining Percentage: 82 %
Battery Voltage: 2.99 V
Brady Statistic AP VP Percent: 61 %
Brady Statistic AP VS Percent: 1.6 %
Brady Statistic AS VP Percent: 34 %
Brady Statistic AS VS Percent: 1.3 %
Brady Statistic RA Percent Paced: 60 %
Date Time Interrogation Session: 20241015020008
Implantable Lead Connection Status: 753985
Implantable Lead Connection Status: 753985
Implantable Lead Connection Status: 753985
Implantable Lead Implant Date: 20230717
Implantable Lead Implant Date: 20230717
Implantable Lead Implant Date: 20230717
Implantable Lead Location: 753858
Implantable Lead Location: 753859
Implantable Lead Location: 753860
Implantable Pulse Generator Implant Date: 20230717
Lead Channel Impedance Value: 450 Ohm
Lead Channel Impedance Value: 530 Ohm
Lead Channel Impedance Value: 560 Ohm
Lead Channel Pacing Threshold Amplitude: 0.75 V
Lead Channel Pacing Threshold Amplitude: 0.75 V
Lead Channel Pacing Threshold Amplitude: 2 V
Lead Channel Pacing Threshold Pulse Width: 0.5 ms
Lead Channel Pacing Threshold Pulse Width: 0.5 ms
Lead Channel Pacing Threshold Pulse Width: 0.5 ms
Lead Channel Sensing Intrinsic Amplitude: 12 mV
Lead Channel Sensing Intrinsic Amplitude: 5 mV
Lead Channel Setting Pacing Amplitude: 2 V
Lead Channel Setting Pacing Amplitude: 2.5 V
Lead Channel Setting Pacing Amplitude: 2.5 V
Lead Channel Setting Pacing Pulse Width: 0.5 ms
Lead Channel Setting Pacing Pulse Width: 0.5 ms
Lead Channel Setting Sensing Sensitivity: 2 mV
Pulse Gen Model: 3562
Pulse Gen Serial Number: 8094736

## 2023-05-03 ENCOUNTER — Ambulatory Visit (INDEPENDENT_AMBULATORY_CARE_PROVIDER_SITE_OTHER): Payer: Medicare PPO | Admitting: Diagnostic Neuroimaging

## 2023-05-03 ENCOUNTER — Encounter: Payer: Self-pay | Admitting: Diagnostic Neuroimaging

## 2023-05-03 VITALS — BP 129/76 | HR 60 | Ht 71.0 in | Wt 211.0 lb

## 2023-05-03 DIAGNOSIS — R413 Other amnesia: Secondary | ICD-10-CM | POA: Diagnosis not present

## 2023-05-03 DIAGNOSIS — R471 Dysarthria and anarthria: Secondary | ICD-10-CM | POA: Diagnosis not present

## 2023-05-03 DIAGNOSIS — R4701 Aphasia: Secondary | ICD-10-CM | POA: Diagnosis not present

## 2023-05-03 NOTE — Progress Notes (Signed)
[facility]  PATIENT: Joseph [name] DOB: Jun 17, 1946  REFERRING CLINICIAN: Terrilee Files, MD HISTORY FROM: PATIENT  REASON FOR VISIT: NEW CONSULT   HISTORICAL  CHIEF COMPLAINT:  Chief Complaint  Patient presents with   New Patient (Initial Visit)    RM , here with wife Darel Hong  Pt is here for aphasia. Pt states he is having trouble with slurred speech, hard finding his words. Pt's wife states pt was told in the hospital he might have had a stroke, pt was not able to have an MRI because he has a pacemaker.     HISTORY OF PRESENT ILLNESS:   77 year old male here for evaluation of slurred speech, stuttering speech, hoarse voice.  For past 1 year patient has had a soft voice, hoarse voice that is progressively worsened.  In the last 1 month has had increasing slurred speech and stuttering speech.  Has been noted to have some right facial weakness.  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  Has also been diagnosed with dementia by Dr. Ronal Fear office about 2 years ago.   REVIEW OF SYSTEMS: Full 14 system review of systems performed and negative with exception of: as per HPI.  ALLERGIES: No Known Allergies  HOME MEDICATIONS: Outpatient Medications Prior to Visit  Medication Sig Dispense Refill   alfuzosin (UROXATRAL) 10 MG 24 hr tablet Take 1 tablet (10 mg total) by mouth at bedtime. 90 tablet 3   apixaban (ELIQUIS) 5 MG TABS tablet Take 1 tablet (5 mg total) by mouth 2 (two) times daily. 60 tablet 1   carvedilol (COREG) 12.5 MG tablet TAKE 1 TABLET(12.5 MG) BY MOUTH TWICE DAILY 180 tablet 3   cholecalciferol (VITAMIN D3) 25 MCG (1000 UNIT) tablet Take 1 tablet (1,000 Units total) by mouth daily. 90 tablet 1   donepezil (ARICEPT) 10 MG tablet Take 10 mg by mouth daily.     doxazosin (CARDURA) 4 MG tablet Take 4 mg by mouth at bedtime.     hydrochlorothiazide (HYDRODIURIL) 25 MG tablet Take 25 mg by mouth daily.     latanoprost (XALATAN) 0.005 % ophthalmic  solution Place 1 drop into both eyes at bedtime. 2.5 mL 12   levocetirizine (XYZAL) 5 MG tablet Take 1 tablet (5 mg total) by mouth every evening. 30 tablet 3   mirabegron ER (MYRBETRIQ) 50 MG TB24 tablet Take 1 tablet (50 mg total) by mouth daily. 90 tablet 3   Misc Natural Products (PROSTATE HEALTH PO) Take 1 capsule by mouth daily. Super Beta Prostate     Multiple Vitamin (MULTIVITAMIN WITH MINERALS) TABS tablet Take 1 tablet by mouth daily.     Polyethyl Glycol-Propyl Glycol (LUBRICANT EYE DROPS) 0.4-0.3 % SOLN Place 1 drop into both eyes 3 (three) times daily as needed (dry/irritated eyes.).     rosuvastatin (CRESTOR) 5 MG tablet Take 1 tablet (5 mg total) by mouth daily. 90 tablet 3   sacubitril-valsartan (ENTRESTO) 24-26 MG TAKE 1 TABLET BY MOUTH TWICE DAILY 180 tablet 3   solifenacin (VESICARE) 5 MG tablet Take 1 tablet (5 mg total) by mouth at bedtime. 90 tablet 3   spironolactone (ALDACTONE) 25 MG tablet TAKE 1/2 TABLET BY MOUTH DAILY 60 tablet 7   cloNIDine (CATAPRES) 0.1 MG tablet Take 1 tablet (0.1 mg total) by mouth 2 (two) times daily. 60 tablet 2   No facility-administered medications prior to visit.    PAST MEDICAL HISTORY: Past Medical History:  Diagnosis Date   1st degree AV block  Atrial fibrillation (HCC)    Diastolic dysfunction 4/13   grade 1   Hypertension    New onset atrial fibrillation (HCC) 06/2015   RBBB    Sinus bradycardia    Stroke (HCC)    no defecits, pt denies having a stroke, says Dr Tresa Endo diagnosed this    PAST SURGICAL HISTORY: Past Surgical History:  Procedure Laterality Date   BIV PACEMAKER INSERTION CRT-P N/A 01/26/2022   Procedure: BIV PACEMAKER INSERTION CRT-P;  Surgeon: Marinus Maw, MD;  Location: [facility] INVASIVE CV LAB;  Service: Cardiovascular;  Laterality: N/A;   COLONOSCOPY     COLONOSCOPY  12/30/2011   Procedure: COLONOSCOPY;  Surgeon: Corbin Ade, MD;  [facility];  Service: Endoscopy;  Laterality: N/A;  1:45PM    COLONOSCOPY N/A 04/19/2019   Procedure: COLONOSCOPY;  Surgeon: Corbin Ade, MD;  [facility];  Service: Endoscopy;  Laterality: N/A;  2:00pm   EXCISION MASS LOWER EXTREMETIES Left 05/12/2018   Procedure: Left foot plantar fibroma excision;  Surgeon: Toni Arthurs, MD;  Location: [facility];  Service: Orthopedics;  Laterality: Left;   FOOT SURGERY Left    LEFT HEART CATH AND CORONARY ANGIOGRAPHY N/A 01/26/2022   Procedure: LEFT HEART CATH AND CORONARY ANGIOGRAPHY;  Surgeon: Lennette Bihari, MD;  Location: [facility] INVASIVE CV LAB;  Service: Cardiovascular;  Laterality: N/A;   SPERMATOCELECTOMY Right 11/13/2019   Procedure: EXCISION OF SPERMATIC CORD CYST;  Surgeon: Malen Gauze, MD;  Location: [facility];  Service: Urology;  Laterality: Right;   VASECTOMY  11/13/2019   Procedure: VASECTOMY;  Surgeon: Malen Gauze, MD;  Location: [facility];  Service: Urology;;    FAMILY HISTORY: Family History  Problem Relation Age of Onset   Cancer Brother    CVA Mother    Hypertension Father    Colon cancer Neg Hx     SOCIAL HISTORY: Social History   Socioeconomic History   Marital status: Married    Spouse name: Not on file   Number of children: 5   Years of education: Not on file   Highest education level: Not on file  Occupational History   Not on file  Tobacco Use   Smoking status: Former    Current packs/day: 0.00    Types: Cigarettes    Quit date: 1996    Years since quitting: 28.8   Smokeless tobacco: Never   Tobacco comments:    quit about 40 yrs ago  Vaping Use   Vaping status: Never Used  Substance and Sexual Activity   Alcohol use: Not Currently    Comment: 6 pack of beer or less in a week   Drug use: No   Sexual activity: Not on file  Other Topics Concern   Not on file  Social History Narrative   Not on file   Social Determinants of Health   Financial Resource Strain: Low Risk  (04/12/2023)   Overall Financial Resource Strain (CARDIA)     Difficulty of Paying Living Expenses: Not hard at all  Food Insecurity: No Food Insecurity (04/12/2023)   Hunger Vital Sign    Worried About Running Out of Food in the Last Year: Never true    Ran Out of Food in the Last Year: Never true  Transportation Needs: No Transportation Needs (04/12/2023)   PRAPARE - Administrator, Civil Service (Medical): No    Lack of Transportation (Non-Medical): No  Physical Activity: Inactive (04/12/2023)   Exercise  Vital Sign    Days of Exercise per Week: 0 days    Minutes of Exercise per Session: 0 min  Stress: No Stress Concern Present (04/12/2023)   [employer] of Occupational Health - Occupational Stress Questionnaire    Feeling of Stress : Not at all  Social Connections: Socially Integrated (04/12/2023)   Social Connection and Isolation Panel [NHANES]    Frequency of Communication with Friends and Family: More than three times a week    Frequency of Social Gatherings with Friends and Family: More than three times a week    Attends Religious Services: More than 4 times per year    Active Member of [employer] or Organizations: Yes    Attends Engineer, structural: More than 4 times per year    Marital Status: Married  Catering manager Violence: Not At Risk (04/12/2023)   Humiliation, Afraid, Rape, and Kick questionnaire    Fear of Current or Ex-Partner: No    Emotionally Abused: No    Physically Abused: No    Sexually Abused: No     PHYSICAL EXAM  GENERAL EXAM/CONSTITUTIONAL: Vitals:  Vitals:   05/03/23 1040  BP: 129/76  Pulse: 60  Weight: 211 lb (95.7 kg)  Height: 5\' 11"  (1.803 m)   Body mass index is 29.43 kg/m. Wt Readings from Last 3 Encounters:  05/03/23 211 lb (95.7 kg)  04/22/23 214 lb (97.1 kg)  04/12/23 214 lb (97.1 kg)   Patient is in no distress; well developed, nourished and groomed; neck is supple  CARDIOVASCULAR: Examination of carotid arteries is normal; no carotid bruits Regular rate and rhythm, no  murmurs Examination of peripheral vascular system by observation and palpation is normal  EYES: Ophthalmoscopic exam of optic discs and posterior segments is normal; no papilledema or hemorrhages No results found.  MUSCULOSKELETAL: Gait, strength, tone, movements noted in Neurologic exam below  NEUROLOGIC: MENTAL STATUS:      No data to display         awake, alert, oriented to person, place and time recent and remote memory intact normal attention and concentration language fluent, comprehension intact, naming intact fund of knowledge appropriate  CRANIAL NERVE:  2nd - no papilledema on fundoscopic exam 2nd, 3rd, 4th, 6th - pupils equal and reactive to light, visual fields full to confrontation, extraocular muscles intact, no nystagmus 5th - facial sensation symmetric 7th - facial strength --> DECR RIGHT LOWER FACIAL STRENGTH 8th - hearing intact 9th - palate elevates symmetrically, uvula midline 11th - shoulder shrug symmetric 12th - tongue protrusion midline HOARSE, SOFT VOICE  MOTOR:  normal bulk and tone, full strength in the BUE, BLE  SENSORY:  normal and symmetric to light touch, temperature, vibration  COORDINATION:  finger-nose-finger, fine finger movements normal  REFLEXES:  deep tendon reflexes 1+ and symmetric  GAIT/STATION:  narrow based gait     DIAGNOSTIC DATA (LABS, IMAGING, TESTING) - I reviewed patient records, labs, notes, testing and imaging myself where available.  Lab Results  Component Value Date   WBC 4.5 04/22/2023   HGB 11.7 (L) 04/22/2023   HCT 38.6 (L) 04/22/2023   MCV 71.3 (L) 04/22/2023   PLT 223 04/22/2023      Component Value Date/Time   NA 132 (L) 04/22/2023 1602   NA 142 02/25/2023 1135   K 4.0 04/22/2023 1602   K 4.2 10/22/2011 1131   CL 100 04/22/2023 1602   CL 106 10/22/2011 1131   CO2 24 04/22/2023 1602   CO2 27  10/22/2011 1131   GLUCOSE 100 (H) 04/22/2023 1602   BUN 25 (H) 04/22/2023 1602   BUN 16  02/25/2023 1135   CREATININE 1.39 (H) 04/22/2023 1602   CREATININE 0.97 10/22/2011 1131   CALCIUM 8.5 (L) 04/22/2023 1602   CALCIUM 8.9 10/22/2011 1131   PROT 6.7 04/22/2023 1602   PROT 6.8 02/25/2023 1135   ALBUMIN 3.8 04/22/2023 1602   ALBUMIN 4.2 02/25/2023 1135   AST 21 04/22/2023 1602   AST 24 09/11/2011 1134   ALT 15 04/22/2023 1602   ALKPHOS 66 04/22/2023 1602   ALKPHOS 97 09/11/2011 1134   BILITOT 1.1 04/22/2023 1602   BILITOT 1.0 02/25/2023 1135   BILITOT 0.4 09/11/2011 1134   GFRNONAA 53 (L) 04/22/2023 1602   GFRAA >60 11/06/2019 1924   Lab Results  Component Value Date   CHOL 196 02/25/2023   HDL 60 02/25/2023   LDLCALC 121 (H) 02/25/2023   TRIG 80 02/25/2023   CHOLHDL 3.3 02/25/2023   Lab Results  Component Value Date   HGBA1C 6.2 (H) 02/25/2023   Lab Results  Component Value Date   VITAMINB12 707 02/25/2023   Lab Results  Component Value Date   TSH 1.110 02/25/2023    02/13/21 MRI brain 1. Compared with 2012 MRI, there has been extensive progression of chronic microvascular ischemic changes. There has been extensive progression of numerous areas of chronic microhemorrhage. Pattern most consistent with chronic microvascular ischemic change and poorly controlled hypertension. Correlate with clinical risk factors. 2. No acute infarct or hydrocephalus.   04/22/23 CT head  1. No acute intracranial hemorrhage or infarct. 2. Multiple remote lacunar infarcts as outlined above. 3. Mild senescent change.    ASSESSMENT AND PLAN  77 y.o. year old male here with:   Dx:  1. Aphasia   2. Dysarthria   3. Memory loss     PLAN:  NEW ONSET SLURRED SPEECH, WORD FINDING DIFFICULTY - could be related to new stroke vs progression of dementia - check MRI brain (pacemaker 2023; setup at [facility] hospital)  HISTORY OF DEMENTIA (vascular vs neurodegenerative; since ~2022; decline in ADLs) - continue donepezil; consider adding on memantine - safety /  supervision issues reviewed - daily physical activity / exercise (at least 15-30 minutes) - eat more plants / vegetables - increase social activities, brain stimulation, games, puzzles, hobbies, crafts, arts, music - aim for at least 7-8 hours sleep per night (or more) - avoid smoking and alcohol - caution with medications, finances; no driving  Orders Placed This Encounter  Procedures   MR BRAIN WO CONTRAST   Return for pending test results, pending if symptoms worsen or fail to improve.    Suanne Marker, MD 05/03/2023, 11:44 AM Certified in Neurology, Neurophysiology and Neuroimaging  [facility] [address] [place], [place] [address] [phone]

## 2023-05-03 NOTE — Patient Instructions (Signed)
  NEW ONSET SLURRED SPEECH, WORD FINDING DIFFICULTY - could be related to new stroke vs progression of dementia - check MRI brain (pacemaker 2023; setup at [facility] hospital)  HISTORY OF DEMENTIA (vascular vs neurodegenerative; since ~2022; decline in ADLs) - continue donepezil; consider adding on memantine - safety / supervision issues reviewed - daily physical activity / exercise (at least 15-30 minutes) - eat more plants / vegetables - increase social activities, brain stimulation, games, puzzles, hobbies, crafts, arts, music - aim for at least 7-8 hours sleep per night (or more) - avoid smoking and alcohol - caution with medications, finances; no driving

## 2023-05-06 ENCOUNTER — Telehealth: Payer: Self-pay | Admitting: Diagnostic Neuroimaging

## 2023-05-06 NOTE — Telephone Encounter (Signed)
[employer] auth: [id number] exp. 05/06/23-07/05/23, [facility] NPR Case Number: [id number] sent to [facility] [phone]

## 2023-05-14 ENCOUNTER — Other Ambulatory Visit (HOSPITAL_COMMUNITY): Payer: Self-pay | Admitting: Nephrology

## 2023-05-14 ENCOUNTER — Ambulatory Visit: Payer: Medicare PPO | Attending: Internal Medicine | Admitting: Internal Medicine

## 2023-05-14 ENCOUNTER — Encounter: Payer: Self-pay | Admitting: Internal Medicine

## 2023-05-14 VITALS — BP 120/84 | HR 59 | Ht 70.0 in | Wt 210.4 lb

## 2023-05-14 DIAGNOSIS — I5022 Chronic systolic (congestive) heart failure: Secondary | ICD-10-CM | POA: Diagnosis not present

## 2023-05-14 DIAGNOSIS — N1831 Chronic kidney disease, stage 3a: Secondary | ICD-10-CM

## 2023-05-14 LAB — CUP PACEART INCLINIC DEVICE CHECK
Battery Remaining Longevity: 67 mo
Battery Voltage: 2.99 V
Brady Statistic RA Percent Paced: 61 %
Brady Statistic RV Percent Paced: 95 %
Date Time Interrogation Session: 20241101122852
Implantable Lead Connection Status: 753985
Implantable Lead Connection Status: 753985
Implantable Lead Connection Status: 753985
Implantable Lead Implant Date: 20230717
Implantable Lead Implant Date: 20230717
Implantable Lead Implant Date: 20230717
Implantable Lead Location: 753858
Implantable Lead Location: 753859
Implantable Lead Location: 753860
Implantable Pulse Generator Implant Date: 20230717
Lead Channel Impedance Value: 450 Ohm
Lead Channel Impedance Value: 512.5 Ohm
Lead Channel Impedance Value: 525 Ohm
Lead Channel Pacing Threshold Amplitude: 0.75 V
Lead Channel Pacing Threshold Amplitude: 0.75 V
Lead Channel Pacing Threshold Amplitude: 1 V
Lead Channel Pacing Threshold Amplitude: 1 V
Lead Channel Pacing Threshold Amplitude: 1.25 V
Lead Channel Pacing Threshold Amplitude: 1.25 V
Lead Channel Pacing Threshold Pulse Width: 0.5 ms
Lead Channel Pacing Threshold Pulse Width: 0.5 ms
Lead Channel Pacing Threshold Pulse Width: 0.5 ms
Lead Channel Pacing Threshold Pulse Width: 0.5 ms
Lead Channel Pacing Threshold Pulse Width: 0.5 ms
Lead Channel Pacing Threshold Pulse Width: 0.5 ms
Lead Channel Sensing Intrinsic Amplitude: 12 mV
Lead Channel Sensing Intrinsic Amplitude: 3.6 mV
Lead Channel Setting Pacing Amplitude: 2 V
Lead Channel Setting Pacing Amplitude: 2 V
Lead Channel Setting Pacing Amplitude: 2.5 V
Lead Channel Setting Pacing Pulse Width: 0.5 ms
Lead Channel Setting Pacing Pulse Width: 0.5 ms
Lead Channel Setting Sensing Sensitivity: 2 mV
Pulse Gen Model: 3562
Pulse Gen Serial Number: 8094736

## 2023-05-14 NOTE — Progress Notes (Signed)
HPI Joseph [name] is a 77 y.o. male with a hx of hypertension, PAF on Eliquis, right bundle branch block, history of stroke, NSVT, celiac artery stenosis, SMA stenosis, tachybradycardia syndrome requiring pacemaker, and nonischemic cardiomyopathy . In the interim, he notes that he has done well. His incisional pain has resolved. No fever or chills or swelling. He is pending a brain MRI. He admits to being sedentary.  No Known Allergies   Current Outpatient Medications  Medication Sig Dispense Refill   alfuzosin (UROXATRAL) 10 MG 24 hr tablet Take 1 tablet (10 mg total) by mouth at bedtime. 90 tablet 3   apixaban (ELIQUIS) 5 MG TABS tablet Take 1 tablet (5 mg total) by mouth 2 (two) times daily. 60 tablet 1   carvedilol (COREG) 12.5 MG tablet TAKE 1 TABLET(12.5 MG) BY MOUTH TWICE DAILY 180 tablet 3   cholecalciferol (VITAMIN D3) 25 MCG (1000 UNIT) tablet Take 1 tablet (1,000 Units total) by mouth daily. 90 tablet 1   cloNIDine (CATAPRES) 0.1 MG tablet Take 1 tablet (0.1 mg total) by mouth 2 (two) times daily. 60 tablet 2   donepezil (ARICEPT) 10 MG tablet Take 10 mg by mouth daily.     doxazosin (CARDURA) 4 MG tablet Take 4 mg by mouth at bedtime.     hydrochlorothiazide (HYDRODIURIL) 25 MG tablet Take 25 mg by mouth daily.     latanoprost (XALATAN) 0.005 % ophthalmic solution Place 1 drop into both eyes at bedtime. 2.5 mL 12   levocetirizine (XYZAL) 5 MG tablet Take 1 tablet (5 mg total) by mouth every evening. 30 tablet 3   mirabegron ER (MYRBETRIQ) 50 MG TB24 tablet Take 1 tablet (50 mg total) by mouth daily. 90 tablet 3   Misc Natural Products (PROSTATE HEALTH PO) Take 1 capsule by mouth daily. Super Beta Prostate     Multiple Vitamin (MULTIVITAMIN WITH MINERALS) TABS tablet Take 1 tablet by mouth daily.     Polyethyl Glycol-Propyl Glycol (LUBRICANT EYE DROPS) 0.4-0.3 % SOLN Place 1 drop into both eyes 3 (three) times daily as needed (dry/irritated eyes.).     rosuvastatin  (CRESTOR) 5 MG tablet Take 1 tablet (5 mg total) by mouth daily. 90 tablet 3   sacubitril-valsartan (ENTRESTO) 24-26 MG TAKE 1 TABLET BY MOUTH TWICE DAILY 180 tablet 3   solifenacin (VESICARE) 5 MG tablet Take 1 tablet (5 mg total) by mouth at bedtime. 90 tablet 3   spironolactone (ALDACTONE) 25 MG tablet TAKE 1/2 TABLET BY MOUTH DAILY 60 tablet 7   No current facility-administered medications for this visit.     Past Medical History:  Diagnosis Date   1st degree AV block    Atrial fibrillation (HCC)    Diastolic dysfunction 4/13   grade 1   Hypertension    New onset atrial fibrillation (HCC) 06/2015   RBBB    Sinus bradycardia    Stroke (HCC)    no defecits, pt denies having a stroke, says Dr Tresa Endo diagnosed this    ROS:   All systems reviewed and negative except as noted in the HPI.   Past Surgical History:  Procedure Laterality Date   BIV PACEMAKER INSERTION CRT-P N/A 01/26/2022   Procedure: BIV PACEMAKER INSERTION CRT-P;  Surgeon: Marinus Maw, MD;  Location: [facility] INVASIVE CV LAB;  Service: Cardiovascular;  Laterality: N/A;   COLONOSCOPY     COLONOSCOPY  12/30/2011   Procedure: COLONOSCOPY;  Surgeon: Corbin Ade, MD;  [facility];  Service: Endoscopy;  Laterality: N/A;  1:45PM   COLONOSCOPY N/A 04/19/2019   Procedure: COLONOSCOPY;  Surgeon: Corbin Ade, MD;  [facility];  Service: Endoscopy;  Laterality: N/A;  2:00pm   EXCISION MASS LOWER EXTREMETIES Left 05/12/2018   Procedure: Left foot plantar fibroma excision;  Surgeon: Toni Arthurs, MD;  Location: [facility];  Service: Orthopedics;  Laterality: Left;   FOOT SURGERY Left    LEFT HEART CATH AND CORONARY ANGIOGRAPHY N/A 01/26/2022   Procedure: LEFT HEART CATH AND CORONARY ANGIOGRAPHY;  Surgeon: Lennette Bihari, MD;  [facility]: [facility];  Service: Cardiovascular;  Laterality: N/A;   SPERMATOCELECTOMY Right 11/13/2019   Procedure: EXCISION OF SPERMATIC CORD CYST;   Surgeon: Malen Gauze, MD;  Location: [facility];  Service: Urology;  Laterality: Right;   VASECTOMY  11/13/2019   Procedure: VASECTOMY;  Surgeon: Malen Gauze, MD;  [facility]: [facility];  Service: Urology;;     Family History  Problem Relation Age of Onset   Cancer Brother    CVA Mother    Hypertension Father    Colon cancer Neg Hx      Social History   Socioeconomic History   Marital status: Married    Spouse name: Not on file   Number of children: 5   Years of education: Not on file   Highest education level: Not on file  Occupational History   Not on file  Tobacco Use   Smoking status: Former    Current packs/day: 0.00    Types: Cigarettes    Quit date: 1996    Years since quitting: 28.8   Smokeless tobacco: Never   Tobacco comments:    quit about 40 yrs ago  Vaping Use   Vaping status: Never Used  Substance and Sexual Activity   Alcohol use: Not Currently    Comment: 6 pack of beer or less in a week   Drug use: No   Sexual activity: Not on file  Other Topics Concern   Not on file  Social History Narrative   Not on file   Social Determinants of Health   Financial Resource Strain: Low Risk  (04/12/2023)   Overall Financial Resource Strain (CARDIA)    Difficulty of Paying Living Expenses: Not hard at all  Food Insecurity: No Food Insecurity (04/12/2023)   Hunger Vital Sign    Worried About Running Out of Food in the Last Year: Never true    Ran Out of Food in the Last Year: Never true  Transportation Needs: No Transportation Needs (04/12/2023)   PRAPARE - Administrator, Civil Service (Medical): No    Lack of Transportation (Non-Medical): No  Physical Activity: Inactive (04/12/2023)   Exercise Vital Sign    Days of Exercise per Week: 0 days    Minutes of Exercise per Session: 0 min  Stress: No Stress Concern Present (04/12/2023)   [employer] of Occupational Health - Occupational Stress Questionnaire    Feeling of Stress : Not at all   Social Connections: Socially Integrated (04/12/2023)   Social Connection and Isolation Panel [NHANES]    Frequency of Communication with Friends and Family: More than three times a week    Frequency of Social Gatherings with Friends and Family: More than three times a week    Attends Religious Services: More than 4 times per year    Active Member of [employer] or Organizations: Yes    Attends Club or  Organization Meetings: More than 4 times per year    Marital Status: Married  Catering manager Violence: Not At Risk (04/12/2023)   Humiliation, Afraid, Rape, and Kick questionnaire    Fear of Current or Ex-Partner: No    Emotionally Abused: No    Physically Abused: No    Sexually Abused: No     BP 120/84 (BP Location: Left Arm, Patient Position: Sitting, Cuff Size: Normal)   Pulse (!) 59   Ht 5\' 10"  (1.778 m)   Wt 210 lb 6.4 oz (95.4 kg)   SpO2 99%   BMI 30.19 kg/m   Physical Exam:  Well appearing NAD HEENT: Unremarkable Neck:  No JVD, no thyromegally Lymphatics:  No adenopathy Back:  No CVA tenderness Lungs:  Clear with no wheezes HEART:  Regular rate rhythm, no murmurs, no rubs, no clicks Abd:  soft, positive bowel sounds, no organomegally, no rebound, no guarding Ext:  2 plus pulses, no edema, no cyanosis, no clubbing Skin:  No rashes no nodules Neuro:  CN II through XII intact, motor grossly intact  DEVICE  Normal device function.  See PaceArt for details.   Assess/Plan:  Chronic systolic heart failure -his symptoms are class 2. No change in medical therapy. Heart block/PPM - his [employer] biv PPM is working normally. His LV lead is better and we turned down the outputs to 2 at 0.5.  NSVT - no symptoms. We will follow.   Sharlot Gowda Keirsten Matuska,MD

## 2023-05-14 NOTE — Patient Instructions (Signed)

## 2023-05-14 NOTE — Progress Notes (Signed)
Remote pacemaker transmission.   

## 2023-05-26 ENCOUNTER — Ambulatory Visit (HOSPITAL_COMMUNITY)
Admission: RE | Admit: 2023-05-26 | Discharge: 2023-05-26 | Disposition: A | Discharge status: Home / Self Care | Payer: Medicare PPO | Source: Ambulatory Visit | Attending: Nephrology | Admitting: Nephrology

## 2023-05-26 ENCOUNTER — Ambulatory Visit (HOSPITAL_COMMUNITY): Payer: Medicare PPO

## 2023-05-26 DIAGNOSIS — N1831 Chronic kidney disease, stage 3a: Secondary | ICD-10-CM | POA: Diagnosis present

## 2023-05-27 ENCOUNTER — Ambulatory Visit (HOSPITAL_COMMUNITY): Payer: Medicare PPO

## 2023-05-31 ENCOUNTER — Other Ambulatory Visit: Payer: Self-pay | Admitting: Family Medicine

## 2023-05-31 NOTE — Telephone Encounter (Unsigned)
Copied from CRM [id number]. Topic: Clinical - Medication Refill >> May 31, 2023 11:21 AM Maxwell Marion wrote: Most Recent Primary Care Visit:  Provider: Recardo Evangelist A  Department: [facility]  Visit Type: MEDICARE AWV, INITIAL  Date: 04/12/2023  Medication: ***  Has the patient contacted their pharmacy? {yes/no:20286} (Agent: If no, request that the patient contact the pharmacy for the refill. If patient does not wish to contact the pharmacy document the reason why and proceed with request.) (Agent: If yes, when and what did the pharmacy advise?)  Is this the correct pharmacy for this prescription? {yes/no:20286} If no, delete pharmacy and type the correct one.  This is the patient's preferred pharmacy:  [id number] - [place],  - [address] [place] [address] Phone: [phone] Fax: [phone]   Has the prescription been filled recently? {yes/no:20286}  Is the patient out of the medication? {yes/no:20286}  Has the patient been seen for an appointment in the last year OR does the patient have an upcoming appointment? {yes/no:20286}  Can we respond through MyChart? {yes/no:20286}  Agent: Please be advised that Rx refills may take up to 3 business days. We ask that you follow-up with your pharmacy.

## 2023-06-01 MED ORDER — CLONIDINE HCL 0.1 MG PO TABS: 0.1000 mg | ORAL_TABLET | Freq: Two times a day (BID) | ORAL | 2 refills | Status: DC

## 2023-06-03 ENCOUNTER — Other Ambulatory Visit: Payer: Self-pay | Admitting: Family Medicine

## 2023-06-03 ENCOUNTER — Telehealth: Payer: Self-pay

## 2023-06-03 MED ORDER — CLONIDINE HCL 0.1 MG PO TABS: 0.1000 mg | ORAL_TABLET | Freq: Two times a day (BID) | ORAL | 0 refills | Status: DC

## 2023-06-03 NOTE — Telephone Encounter (Signed)
sent 

## 2023-06-03 NOTE — Telephone Encounter (Signed)
Copied from CRM [id number]. Topic: Clinical - Medication Refill >> Jun 03, 2023 10:32 AM Herbert Seta B wrote: Most Recent Primary Care Visit:  Provider: Recardo Evangelist A  Department: [facility]  Visit Type: MEDICARE AWV, INITIAL  Date: 04/12/2023  Medication:  cloNIDine (CATAPRES) 0.1 MG tablet   Has the patient contacted their pharmacy? Yes-30 day supply was sent, patient **NEEDS 90 DAY SUPPLY** (Agent: If no, request that the patient contact the pharmacy for the refill. If patient does not wish to contact the pharmacy document the reason why and proceed with request.) (Agent: If yes, when and what did the pharmacy advise?)  Is this the correct pharmacy for this prescription? yes If no, delete pharmacy and type the correct one.  This is the patient's preferred pharmacy:  [id number] - [place], [place] - [address] [place] [address] Phone: [phone] Fax: [phone]   Has the prescription been filled recently? yes  Is the patient out of the medication? Yes x2 days  Has the patient been seen for an appointment in the last year OR does the patient have an upcoming appointment? yes  Can we respond through MyChart? yes  Agent: Please be advised that Rx refills may take up to 3 business days. We ask that you follow-up with your pharmacy.

## 2023-06-04 NOTE — Telephone Encounter (Signed)
Pt. Notified. Requested that all medications to be on 90 day supply.

## 2023-06-15 ENCOUNTER — Ambulatory Visit (HOSPITAL_COMMUNITY)
Admission: RE | Admit: 2023-06-15 | Discharge: 2023-06-15 | Disposition: A | Discharge status: Home / Self Care | Payer: Medicare PPO | Source: Ambulatory Visit | Attending: Diagnostic Neuroimaging | Admitting: Diagnostic Neuroimaging

## 2023-06-15 DIAGNOSIS — R471 Dysarthria and anarthria: Secondary | ICD-10-CM | POA: Diagnosis present

## 2023-06-15 DIAGNOSIS — R4701 Aphasia: Secondary | ICD-10-CM | POA: Insufficient documentation

## 2023-06-15 DIAGNOSIS — R413 Other amnesia: Secondary | ICD-10-CM | POA: Diagnosis present

## 2023-06-25 ENCOUNTER — Other Ambulatory Visit: Payer: Self-pay | Admitting: Family Medicine

## 2023-06-28 NOTE — Patient Instructions (Signed)

## 2023-06-28 NOTE — Progress Notes (Signed)
Established Patient Office Visit   Subjective  Patient ID: Joseph [name], male    DOB: Jan 16, 1946  Age: 77 y.o. MRN: [id number]  Chief Complaint  Patient presents with   Medical Management of Chronic Issues    58mo hypertension, chronic follow-up, medication managment.    He  has a past medical history of 1st degree AV block, Atrial fibrillation (HCC), Diastolic dysfunction (4/13), Hypertension, New onset atrial fibrillation (HCC) (06/2015), RBBB, Sinus bradycardia, and Stroke (HCC).  Patient reports progressively worsening hoarseness over the past 8 months with no signs of improvement despite attempts at symptom management. The hoarseness is accompanied by frequent throat clearing and persistent throat discomfort, which the patient describes as a sensation of irritation. Additionally, the patient reports a lump-like sensation in the throat during swallowing, describing it as if food sometimes feels "stuck," though there is no significant difficulty swallowing solid or liquid foods. Patient denies symptoms such as fever, recent viral illness, or regurgitation of undigested food, heartburn Associated symptoms include postnasal drainage, which may contribute to the frequent throat clearing and overall throat irritation. Despite taking Xyzal 5 mg at bedtime, there has been no improvement in symptoms. The patient denies odynophagia (painful swallowing), systemic symptoms    Review of Systems  Constitutional:  Negative for chills and fever.  Respiratory:  Negative for shortness of breath.   Cardiovascular:  Negative for chest pain.  Gastrointestinal:  Negative for abdominal pain.  Neurological:  Negative for dizziness and headaches.      Objective:     BP 122/73 (Cuff Size: Large)   Pulse 60   Ht 5\' 10"  (1.778 m)   Wt 213 lb (96.6 kg)   SpO2 96%   BMI 30.56 kg/m  BP Readings from Last 3 Encounters:  06/29/23 122/73  05/14/23 120/84  05/03/23 129/76      Physical Exam Vitals  reviewed.  Constitutional:      General: He is not in acute distress.    Appearance: Normal appearance. He is not ill-appearing, toxic-appearing or diaphoretic.  HENT:     Head: Normocephalic.  Eyes:     General:        Right eye: No discharge.        Left eye: No discharge.     Conjunctiva/sclera: Conjunctivae normal.  Cardiovascular:     Rate and Rhythm: Normal rate.     Pulses: Normal pulses.     Heart sounds: Normal heart sounds.  Pulmonary:     Effort: Pulmonary effort is normal. No respiratory distress.     Breath sounds: Normal breath sounds.  Musculoskeletal:     Cervical back: Normal range of motion and neck supple. No tenderness.  Lymphadenopathy:     Cervical: No cervical adenopathy.  Skin:    General: Skin is warm and dry.     Capillary Refill: Capillary refill takes less than 2 seconds.  Neurological:     Mental Status: He is alert.  Psychiatric:        Mood and Affect: Mood normal.        Behavior: Behavior normal.      No results found for any visits on 06/29/23.  The ASCVD Risk score (Arnett DK, et al., 2019) failed to calculate for the following reasons:   Risk score cannot be calculated because patient has a medical history suggesting prior/existing ASCVD    Assessment & Plan:  Prediabetes Assessment & Plan: Last Hemoglobin A1c: 6.2 Labs: Ordered today, results pending; will follow up accordingly.  The patient reports adhering to  lifestyle changes. Reviewed non-pharmacological interventions, including a balanced diet rich in lean proteins, healthy fats, whole grains, and high-fiber vegetables. Emphasized reducing refined sugars and processed carbohydrates, and incorporating more fruits, leafy greens, and legumes. Education: Patient was educated on recognizing signs and symptoms of both hypoglycemia and hyperglycemia, and advised to seek emergency care if these symptoms occur. Follow-Up: Scheduled for follow-up in 3-4 months, or sooner if  needed. Patient Understanding: The patient verbalized understanding of the care plan, and all questions were answered.   Orders: -     Hemoglobin A1c  Mixed hyperlipidemia -     Lipid panel  Chronic hoarseness Assessment & Plan: Referral to ENT for further evaluation  Discussed to stay hydrated by drinking plenty of water and avoid caffeinated or acidic beverages that may irritate the throat. Use a saline nasal spray or rinse to manage postnasal drip, and consider a humidifier to keep the air moist. Rest your voice and avoid straining it by speaking softly without shouting. Limit exposure to irritants like smoke, allergens, and strong odors. If reflux is suspected, avoid spicy, fatty, or acidic foods, and eat smaller meals, avoiding food close to bedtime, can take Nexium OTC  Orders: -     Ambulatory referral to ENT    Return in about 4 months (around 10/28/2023), or if symptoms worsen or fail to improve, for chronic follow-up.   Cruzita Lederer Newman Nip, FNP

## 2023-06-29 ENCOUNTER — Encounter: Payer: Self-pay | Admitting: Family Medicine

## 2023-06-29 ENCOUNTER — Ambulatory Visit (INDEPENDENT_AMBULATORY_CARE_PROVIDER_SITE_OTHER): Payer: Medicare PPO | Admitting: Family Medicine

## 2023-06-29 ENCOUNTER — Telehealth: Payer: Self-pay | Admitting: *Deleted

## 2023-06-29 ENCOUNTER — Telehealth: Payer: Self-pay | Admitting: Diagnostic Neuroimaging

## 2023-06-29 VITALS — BP 122/73 | HR 60 | Ht 70.0 in | Wt 213.0 lb

## 2023-06-29 DIAGNOSIS — R490 Dysphonia: Secondary | ICD-10-CM | POA: Insufficient documentation

## 2023-06-29 DIAGNOSIS — E782 Mixed hyperlipidemia: Secondary | ICD-10-CM

## 2023-06-29 DIAGNOSIS — R7303 Prediabetes: Secondary | ICD-10-CM | POA: Diagnosis not present

## 2023-06-29 NOTE — Telephone Encounter (Signed)
Pt's wife returning call regarding MRI results. Requesting call back

## 2023-06-29 NOTE — Assessment & Plan Note (Signed)
Last Hemoglobin A1c: 6.2 Labs: Ordered today, results pending; will follow up accordingly. The patient reports adhering to  lifestyle changes. Reviewed non-pharmacological interventions, including a balanced diet rich in lean proteins, healthy fats, whole grains, and high-fiber vegetables. Emphasized reducing refined sugars and processed carbohydrates, and incorporating more fruits, leafy greens, and legumes. Education: Patient was educated on recognizing signs and symptoms of both hypoglycemia and hyperglycemia, and advised to seek emergency care if these symptoms occur. Follow-Up: Scheduled for follow-up in 3-4 months, or sooner if needed. Patient Understanding: The patient verbalized understanding of the care plan, and all questions were answered.

## 2023-06-29 NOTE — Telephone Encounter (Signed)
Spoke to wife (checked DPR)  gave  patient MR Brain results Wife expressed understanding and thanked me for calling

## 2023-06-29 NOTE — Telephone Encounter (Signed)
-----   Message from Glenford Bayley [facility] sent at 06/28/2023 11:37 AM EST ----- Small acute infarct noted in the right temporal region. Other areas of chronic small vessel ischemic disease and microhemorrhages. Recommend to continue medical mgmt (eliquis, statin, BP control). -VRP

## 2023-06-29 NOTE — Telephone Encounter (Signed)
See result phone note

## 2023-06-29 NOTE — Assessment & Plan Note (Signed)
Referral to ENT for further evaluation  Discussed to stay hydrated by drinking plenty of water and avoid caffeinated or acidic beverages that may irritate the throat. Use a saline nasal spray or rinse to manage postnasal drip, and consider a humidifier to keep the air moist. Rest your voice and avoid straining it by speaking softly without shouting. Limit exposure to irritants like smoke, allergens, and strong odors. If reflux is suspected, avoid spicy, fatty, or acidic foods, and eat smaller meals, avoiding food close to bedtime, can take Nexium OTC

## 2023-06-30 LAB — LIPID PANEL
Chol/HDL Ratio: 2.9 {ratio} (ref 0.0–5.0)
Cholesterol, Total: 166 mg/dL (ref 100–199)
HDL: 57 mg/dL (ref >39)
LDL Chol Calc (NIH): 94 mg/dL (ref 0–99)
Triglycerides: 78 mg/dL (ref 0–149)
VLDL Cholesterol Cal: 15 mg/dL (ref 5–40)

## 2023-06-30 LAB — HEMOGLOBIN A1C
Est. average glucose Bld gHb Est-mCnc: 131 mg/dL
Hgb A1c MFr Bld: 6.2 % — ABNORMAL HIGH (ref 4.8–5.6)

## 2023-06-30 NOTE — Progress Notes (Signed)
Please inform patient,  Cholesterol levels has improved continue Crestor 5 mg once daily and follow diet low in saturated fat.  Hemoglobin A1c  6.2  prediabetes range  - no medication intervention just lifestyle changes   Pre-Diabetes Diet  Eat More:  Whole grains: Oats, quinoa, brown rice. Vegetables: Leafy greens, broccoli, green beans. Fruits: Low-sugar like berries, apples. Lean proteins: Chicken, fish, beans, eggs. Healthy fats: Nuts, seeds, avocado, olive oil.  Limit:  Refined carbs: White bread, pastries. Sugary foods: Soda, candy, desserts. Fried and fatty foods.  Tips:  Eat balanced meals with portion control. Stay hydrated (water over sugary drinks). Pair with regular exercise (e.g., walking). Example: Grilled chicken, quinoa, and steamed broccoli.  Combine diet with regular physical activity (e.g., 30 minutes of walking daily or 5 times per week.)

## 2023-07-21 ENCOUNTER — Other Ambulatory Visit: Payer: Self-pay

## 2023-07-21 MED ORDER — HYDROCHLOROTHIAZIDE 25 MG PO TABS: 25.0000 mg | ORAL_TABLET | Freq: Every day | ORAL | 0 refills | Status: DC

## 2023-07-27 ENCOUNTER — Ambulatory Visit: Payer: Medicare PPO

## 2023-07-27 DIAGNOSIS — I495 Sick sinus syndrome: Secondary | ICD-10-CM | POA: Diagnosis not present

## 2023-07-27 LAB — CUP PACEART REMOTE DEVICE CHECK
Battery Remaining Longevity: 66 mo
Battery Remaining Percentage: 78 %
Battery Voltage: 2.99 V
Brady Statistic AP VP Percent: 70 %
Brady Statistic AP VS Percent: 1 %
Brady Statistic AS VP Percent: 29 %
Brady Statistic AS VS Percent: 1 %
Brady Statistic RA Percent Paced: 70 %
Date Time Interrogation Session: 20250114020012
Implantable Lead Connection Status: 753985
Implantable Lead Connection Status: 753985
Implantable Lead Connection Status: 753985
Implantable Lead Implant Date: 20230717
Implantable Lead Implant Date: 20230717
Implantable Lead Implant Date: 20230717
Implantable Lead Location: 753858
Implantable Lead Location: 753859
Implantable Lead Location: 753860
Implantable Pulse Generator Implant Date: 20230717
Lead Channel Impedance Value: 430 Ohm
Lead Channel Impedance Value: 490 Ohm
Lead Channel Impedance Value: 510 Ohm
Lead Channel Pacing Threshold Amplitude: 0.75 V
Lead Channel Pacing Threshold Amplitude: 0.75 V
Lead Channel Pacing Threshold Amplitude: 1 V
Lead Channel Pacing Threshold Pulse Width: 0.5 ms
Lead Channel Pacing Threshold Pulse Width: 0.5 ms
Lead Channel Pacing Threshold Pulse Width: 0.5 ms
Lead Channel Sensing Intrinsic Amplitude: 12 mV
Lead Channel Sensing Intrinsic Amplitude: 3 mV
Lead Channel Setting Pacing Amplitude: 2 V
Lead Channel Setting Pacing Amplitude: 2 V
Lead Channel Setting Pacing Amplitude: 2.5 V
Lead Channel Setting Pacing Pulse Width: 0.5 ms
Lead Channel Setting Pacing Pulse Width: 0.5 ms
Lead Channel Setting Sensing Sensitivity: 2 mV
Pulse Gen Model: 3562
Pulse Gen Serial Number: 8094736

## 2023-07-28 ENCOUNTER — Other Ambulatory Visit: Payer: Self-pay | Admitting: Cardiovascular Disease

## 2023-07-28 NOTE — Telephone Encounter (Signed)
 This is a [place] pt.

## 2023-08-16 ENCOUNTER — Encounter (INDEPENDENT_AMBULATORY_CARE_PROVIDER_SITE_OTHER): Payer: Self-pay | Admitting: Otolaryngology

## 2023-08-16 ENCOUNTER — Ambulatory Visit (INDEPENDENT_AMBULATORY_CARE_PROVIDER_SITE_OTHER): Payer: Medicare PPO | Admitting: Otolaryngology

## 2023-08-16 VITALS — BP 170/91 | HR 74 | Ht 70.5 in | Wt 210.0 lb

## 2023-08-16 DIAGNOSIS — R0982 Postnasal drip: Secondary | ICD-10-CM

## 2023-08-16 DIAGNOSIS — J383 Other diseases of vocal cords: Secondary | ICD-10-CM

## 2023-08-16 DIAGNOSIS — I4891 Unspecified atrial fibrillation: Secondary | ICD-10-CM | POA: Diagnosis not present

## 2023-08-16 DIAGNOSIS — F0390 Unspecified dementia without behavioral disturbance: Secondary | ICD-10-CM | POA: Diagnosis not present

## 2023-08-16 DIAGNOSIS — R490 Dysphonia: Secondary | ICD-10-CM

## 2023-08-16 DIAGNOSIS — J3089 Other allergic rhinitis: Secondary | ICD-10-CM

## 2023-08-16 MED ORDER — FLUTICASONE PROPIONATE 50 MCG/ACT NA SUSP: 2.0000 | Freq: Every day | NASAL | 6 refills | Status: AC

## 2023-08-16 NOTE — Progress Notes (Signed)
ENT CONSULT:  Reason for Consult: dysphonia x 2 yrs    HPI: Discussed the use of AI scribe software for clinical note transcription with the patient, who gave verbal consent to proceed.  History of Present Illness   The patient is a 78 [name] with hx of dementia, recent visit to ED and neurology for aphasia, slurred speech, here for presents with voice changes. Joseph is accompanied by his spouse, who assists with providing history due to his dementia.   Joseph has been experiencing voice changes for approximately one and a half to two years, with symptoms worsening over time. There is no associated shortness of breath, difficulty swallowing, or pain with swallowing. Joseph frequently clears his throat. Joseph reports raspy and weak/breathy voice overall with poor projection.   Joseph has a history of smoking but quit approximately thirty years ago.  Joseph is currently on Eliquis for atrial fibrillation. Despite his dementia, Joseph remains relatively independent in his daily activities.      Joseph Reviewed:  ED note 04/22/23  [name] is a 78 y.o. male.  Joseph is here for evaluation of slurred speech and word finding difficulties that have been going on for a week and a half for 2 weeks.  Is happening every day.  Wife also thinks that Metabet a little facial droop although she cannot remember which side.  Joseph denies any headache blurry vision double vision numbness weakness difficulty with ambulation.  Joseph said Joseph knows what Joseph wants to say Joseph just has trouble getting the words out of his mouth.  Joseph does have a history of pacemaker, atrial fibrillation and is on Eliquis.  No recent falls.   The history is provided by the patient and the spouse.   Neurologic Problem This is a new problem. The current episode started more than 1 week ago. The problem occurs constantly. The problem has not changed since onset.Pertinent negatives include no chest pain, no abdominal pain, no headaches and no shortness of breath.  Exacerbated by: talking. Nothing relieves the symptoms. Joseph has tried rest for the symptoms. The treatment provided no relief.   Neurology office visit for aphasia 05/03/23 Dr Joseph [name]  78 year old male here for evaluation of slurred speech, stuttering speech, hoarse voice. For past 1 year patient has had a soft voice, hoarse voice that is progressively worsened. In the last 1 month has had increasing slurred speech and stuttering speech. Has been noted to have some right facial weakness. 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 Has also been diagnosed with dementia by Joseph [name] office about 2 years ago.   NEW ONSET SLURRED SPEECH, WORD FINDING DIFFICULTY - could be related to new stroke vs progression of dementia - check MRI brain (pacemaker 2023; setup at [facility])   HISTORY OF DEMENTIA (vascular vs neurodegenerative; since ~2022; decline in ADLs) - continue donepezil; consider adding on memantine - safety / supervision issues reviewed - daily physical activity / exercise (at least 15-30 minutes) - eat more plants / vegetables - increase social activities, brain stimulation, games, puzzles, hobbies, crafts, arts, music - aim for at least 7-8 hours sleep per night (or more) - avoid smoking and alcohol - caution with medications, finances; no driving   Past Medical History:  Diagnosis Date   1st degree AV block    Atrial fibrillation (HCC)    Diastolic dysfunction 4/13   grade 1   Hypertension    New onset atrial fibrillation (HCC) 06/2015   RBBB  Sinus bradycardia    Stroke [facility])    no defecits, pt denies having a stroke, says Dr Joseph [name] diagnosed this    Past Surgical History:  Procedure Laterality Date   BIV PACEMAKER INSERTION CRT-P N/A 01/26/2022   Procedure: BIV PACEMAKER INSERTION CRT-P;  Surgeon: Joseph Maw, MD;  Location: [facility] INVASIVE CV LAB;  Service: Cardiovascular;  Laterality: N/A;   COLONOSCOPY     COLONOSCOPY  12/30/2011   Procedure: COLONOSCOPY;   Surgeon: Joseph Ade, MD;  Location: [name] SU[facility];  Service: Endoscopy;  Laterality: N/A;  1:45PM   COLONOSCOPY N/A 04/19/2019   Procedure: COLONOSCOPY;  Surgeon: Joseph Ade, MD;  Location: [name];  Service: Endoscopy;  Laterality: N/A;  2:00pm   EXCISION MASS LOWER EXTREMETIES Left 05/12/2018   Procedure: Left foot plantar fibroma excision;  Surgeon: Toni Arthurs, MD;  Location: [facility];  Service: Orthopedics;  Laterality: Left;   FOOT SURGERY Left    LEFT HEART CATH AND CORONARY ANGIOGRAPHY N/A 01/26/2022   Procedure: LEFT HEART CATH AND CORONARY ANGIOGRAPHY;  Surgeon: Joseph Bihari, MD;  Location: [facility] INVASIVE CV LAB;  Service: Cardiovascular;  Laterality: N/A;   SPERMATOCELECTOMY Right 11/13/2019   Procedure: EXCISION OF SPERMATIC CORD CYST;  Surgeon: Joseph Gauze, MD;  Location: [facility];  Service: Urology;  Laterality: Right;   VASECTOMY  11/13/2019   Procedure: VASECTOMY;  Surgeon: Joseph Gauze, MD;  Location: [facility];  Service: Urology;;    Family History  Problem Relation Age of Onset   Cancer Brother    CVA Mother    Hypertension Father    Colon cancer Neg Hx     Social History:  reports that Joseph quit smoking about 29 years ago. His smoking use included cigarettes. Joseph has never used smokeless tobacco. Joseph reports that Joseph does not currently use alcohol. Joseph reports that Joseph does not use drugs.  Allergies: No Known Allergies  Medications: I have reviewed the patient's current medications.  The PMH, PSH, Medications, Allergies, and SH were reviewed and updated.  ROS: Constitutional: Negative for fever, weight loss and weight gain. Cardiovascular: Negative for chest pain and dyspnea on exertion. Respiratory: Is not experiencing shortness of breath at rest. Gastrointestinal: Negative for nausea and vomiting. Neurological: Negative for headaches. Psychiatric: The patient is not nervous/anxious  Blood pressure (!) 170/91, pulse 74, height  5' 10.5" (1.791 m), weight 210 lb (95.3 kg), SpO2 91%.  PHYSICAL EXAM:  Exam: General: Well-developed, well-nourished Communication and Voice: breathy weak, raspy Respiratory Respiratory effort: Equal inspiration and expiration without stridor Cardiovascular Peripheral Vascular: Warm extremities with equal color/perfusion Eyes: No nystagmus with equal extraocular motion bilaterally Neuro/Psych/Balance: Patient oriented to person, place, and time; Appropriate mood and affect; Gait is intact with no imbalance; Cranial nerves I-XII are intact Head and Face Inspection: Normocephalic and atraumatic without mass or lesion Palpation: Facial skeleton intact without bony stepoffs Salivary Glands: No mass or tenderness Facial Strength: Facial motility symmetric and full bilaterally ENT Pinna: External ear intact and fully developed External canal: Canal is patent with intact skin Tympanic Membrane: Clear and mobile External Nose: No scar or anatomic deformity Internal Nose: Septum is deviated to the left. No polyp, or purulence. Mucosal edema and erythema present.  Bilateral inferior turbinate hypertrophy.  Lips, Teeth, and gums: Mucosa and teeth intact and viable TMJ: No pain to palpation with full mobility Oral cavity/oropharynx: No erythema or exudate, no lesions present Nasopharynx: No mass or lesion with intact  mucosa Hypopharynx: Intact mucosa without pooling of secretions Larynx Glottic: Full true vocal cord mobility without lesion or mass, evidence of b/l VF atrophy and glottic insufficiency Supraglottic: Normal appearing epiglottis and AE folds Interarytenoid Space: Moderate pachydermia&edema Subglottic Space: Patent without lesion or edema Neck Neck and Trachea: Midline trachea without mass or lesion Thyroid: No mass or nodularity Lymphatics: No lymphadenopathy  Procedure:  Preoperative diagnosis: hoarseness  Postoperative diagnosis:   same  Procedure: Flexible  fiberoptic laryngoscopy with stroboscopy (16109)  Surgeon: Joseph Croon, MD  Anesthesia: Topical lidocaine and Afrin  Complications: None  Condition is stable throughout exam  Indications and consent:   The patient presents to the clinic with hoarseness. All the risks, benefits, and potential complications were reviewed with the patient preoperatively and informed verbal consent was obtained.  Procedure: The patient was seated upright in the exam chair.   Topical lidocaine and Afrin were applied to the nasal cavity. After adequate anesthesia had occurred, the flexible telescope was passed into the nasal cavity. The nasopharynx was patent without mass or lesion. The scope was passed behind the soft palate and directed toward the base of tongue. The base of tongue was visualized and was symmetric with no apparent masses or abnormal appearing tissue. There were no signs of a mass or pooling of secretions in the piriform sinuses. The supraglottic structures were normal.  The true vocal cords are mobile. The medial edges were bowed. Closure was incomplete with spindle shaped glottic gap. Periodicity present. The mucosal wave and amplitude were intact and symmetric. There is moderate interarytenoid pachydermia and post cricoid edema. The mucosa appears without lesions.   The laryngoscope was then slowly withdrawn and the patient tolerated the procedure well. There were no complications or blood loss.   Studies Reviewed: 02/13/21 MRI brain 1. Compared with 2012 MRI, there has been extensive progression of chronic microvascular ischemic changes. There has been extensive progression of numerous areas of chronic microhemorrhage. Pattern most consistent with chronic microvascular ischemic change and poorly controlled hypertension. Correlate with clinical risk factors. 2. No acute infarct or hydrocephalus.     04/22/23 CT head  1. No acute intracranial hemorrhage or infarct. 2. Multiple remote  lacunar infarcts as outlined above. 3. Mild senescent change.    Assessment/Plan: Encounter Diagnoses  Name Primary?   Dysphonia Yes   Glottic insufficiency    Vocal fold atrophy    Chronic hoarseness    Environmental and seasonal allergies    Post-nasal drip     Assessment and Plan    Chronic dysphonia x 2 yrs, Vocal Cord Atrophy/Glottic insufficiency on strobe exam today Presents with a two-year history of progressively worsening voice changes. 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 No tumors or masses; vocal cords are clear and mobile. Initial management involves voice therapy to improve lung support and voice projection. If ineffective, procedural interventions to augment vocal cords are available. This is a quality of life issue, not life-threatening. Discussed controlling postnasal drainage with Flonase/antihistamine - Refer to voice therapy - Consider procedural interventions if voice therapy is ineffective  Chronic post-nasal drip and suspected environmental allergies - trial of Flonase 2 puffs b/l nares - continue Xyzal 5 g daily   Atrial Fibrillation Managed on Eliquis. No changes in management discussed. - hx of lacunar infarct per Joseph  Dementia Affects ability to provide a complete history. Spouse assists with history-taking and daily activities. Joseph also is being f/b neurology for workup of  aphasia and dysarthria, MRI brain pending.    Follow-up - Schedule follow-up in a few months to reassess voice changes - Option to call and schedule follow-up if needed.  - voice therapy      Thank you for allowing me to participate in the care of this patient. Please do not hesitate to contact me with any questions or concerns.   Joseph Croon, MD Otolaryngology [facility] Phone: [phone] Fax: [phone]    08/16/2023, 11:41 AM

## 2023-08-18 ENCOUNTER — Telehealth: Payer: Self-pay | Admitting: Internal Medicine

## 2023-08-18 DIAGNOSIS — I480 Paroxysmal atrial fibrillation: Secondary | ICD-10-CM

## 2023-08-18 MED ORDER — APIXABAN 5 MG PO TABS: 5.0000 mg | ORAL_TABLET | Freq: Two times a day (BID) | ORAL | 1 refills | Status: DC

## 2023-08-18 NOTE — Telephone Encounter (Signed)
*  STAT* If patient is at the pharmacy, call can be transferred to refill team.   1. Which medications need to be refilled? (please list name of each medication and dose if known)   ELIQUIS  5 MG TABS tablet    2. Which pharmacy/location (including street and city if local pharmacy) is medication to be sent to? [id number] - [place], [place] - [address] AT [address] Phone: [phone]  Fax: [phone]     3. Do they need a 30 day or 90 day supply? 90

## 2023-08-18 NOTE — Telephone Encounter (Signed)
 Prescription refill request for Eliquis  received. Indication: Afib  Last office visit: 05/14/23 Carolynne Citron)  Scr: 1.39 (04/22/23)  Age: 78 Weight: 95.3kg  Appropriate dose. Refill sent.

## 2023-08-23 ENCOUNTER — Encounter (HOSPITAL_COMMUNITY): Payer: Self-pay | Admitting: Speech Pathology

## 2023-08-23 ENCOUNTER — Ambulatory Visit (HOSPITAL_COMMUNITY): Payer: Medicare PPO | Attending: Otolaryngology | Admitting: Speech Pathology

## 2023-08-23 DIAGNOSIS — R490 Dysphonia: Secondary | ICD-10-CM | POA: Insufficient documentation

## 2023-08-23 DIAGNOSIS — R41841 Cognitive communication deficit: Secondary | ICD-10-CM | POA: Diagnosis not present

## 2023-08-23 DIAGNOSIS — J383 Other diseases of vocal cords: Secondary | ICD-10-CM | POA: Insufficient documentation

## 2023-08-23 NOTE — Therapy (Signed)
OUTPATIENT SPEECH LANGUAGE PATHOLOGY VOICE EVALUATION   Patient Name: Joseph [name] MRN: [id number] DOB:[birth year], 78 y.o., male Today's Date: 08/23/2023  PCP: Rica Records, FNP REFERRING PROVIDER: Ashok Croon, MD  END OF SESSION:   08/23/23 1217  SLP Visits / Re-Eval  Visit Number 1  Number of Visits 9  Date for SLP Re-Evaluation 09/23/23  Authorization  Authorization Type [employer]  SLP Time Calculation  SLP Start Time 1015  SLP Stop Time  1100  SLP Time Calculation (min) 45 min  SLP - End of Session  Activity Tolerance Patient tolerated treatment well    Past Medical History:  Diagnosis Date   1st degree AV block    Atrial fibrillation (HCC)    Diastolic dysfunction 4/13   grade 1   Hypertension    New onset atrial fibrillation (HCC) 06/2015   RBBB    Sinus bradycardia    Stroke (HCC)    no defecits, pt denies having a stroke, says Dr Tresa Endo diagnosed this   Past Surgical History:  Procedure Laterality Date   BIV PACEMAKER INSERTION CRT-P N/A 01/26/2022   Procedure: BIV PACEMAKER INSERTION CRT-P;  Surgeon: Marinus Maw, MD;  Location: [facility] INVASIVE CV LAB;  Service: Cardiovascular;  Laterality: N/A;   COLONOSCOPY     COLONOSCOPY  12/30/2011   Procedure: COLONOSCOPY;  Surgeon: Corbin Ade, MD;  Location: [facility];  Service: Endoscopy;  Laterality: N/A;  1:45PM   COLONOSCOPY N/A 04/19/2019   Procedure: COLONOSCOPY;  Surgeon: Corbin Ade, MD;  Location: [facility];  Service: Endoscopy;  Laterality: N/A;  2:00pm   EXCISION MASS LOWER EXTREMETIES Left 05/12/2018   Procedure: Left foot plantar fibroma excision;  Surgeon: Toni Arthurs, MD;  Location: [facility];  Service: Orthopedics;  Laterality: Left;   FOOT SURGERY Left    LEFT HEART CATH AND CORONARY ANGIOGRAPHY N/A 01/26/2022   Procedure: LEFT HEART CATH AND CORONARY ANGIOGRAPHY;  Surgeon: Lennette Bihari, MD;  Location: [facility] INVASIVE CV LAB;  Service:  Cardiovascular;  Laterality: N/A;   SPERMATOCELECTOMY Right 11/13/2019   Procedure: EXCISION OF SPERMATIC CORD CYST;  Surgeon: Malen Gauze, MD;  Location: [facility];  Service: Urology;  Laterality: Right;   VASECTOMY  11/13/2019   Procedure: VASECTOMY;  Surgeon: Malen Gauze, MD;  Location: [facility];  Service: Urology;;   Patient Active Problem List   Diagnosis Date Noted   Chronic hoarseness 06/29/2023   Prediabetes 06/29/2023   AKI (acute kidney injury) (HCC) 02/03/2022   NICM (nonischemic cardiomyopathy) (HCC) 02/03/2022   Sinus node dysfunction (HCC)    Atrial flutter (HCC) 01/23/2022   OAB (overactive bladder) 12/27/2020   Nocturia 01/10/2020   Benign prostatic hyperplasia with urinary obstruction 01/10/2020   Non-recurrent unilateral inguinal hernia without obstruction or gangrene    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   History of adenomatous polyp of colon 02/27/2019   Paroxysmal atrial fibrillation (HCC) 07/18/2015   Bradycardia 06/29/2015   Near syncope    Atrial fibrillation with rapid ventricular response (HCC) 06/27/2015   Left ventricular diastolic dysfunction, NYHA class 3 10/26/2014   Kidney cysts 04/04/2013   HTN (hypertension) 03/31/2013    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 03/31/2013   Diastolic dysfunction- grade 2- echo 06/28/15 03/31/2013   RBBB 03/31/2013   AV block, 1st degree 03/31/2013   Sinus bradycardia 03/31/2013   Hematochezia 12/29/2011    Onset date: ~January 2025  REFERRING DIAG: R49.0 (ICD-10-CM) - Dysphonia 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 Ashok Croon (ENT) for voice therapy due to two-year  history of progressively worsening voice changes. Pt accompanied to the appointment by his wife, Joseph [name], who assisted in providing background information. Pt has also been seen by Dr. Marjory Lies (neurology) in October 2024 and was diagnosed with aphasia, dysarthria, and memory loss. Pt and his wife report a "slurring" of his words, difficulty pronouncing words, hoarseness, and stuttering.   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 No  FALLS: Has patient fallen in last 6 months? No, Number of falls: 0  LIVING ENVIRONMENT: Lives with: lives with their spouse Lives in: House/apartment  PLOF:Level of assistance: Independent with ADLs, Independent with IADLs Employment: Retired  PATIENT GOALS: Improve speaking and voice  OBJECTIVE:  Note: Objective measures were completed at Evaluation unless otherwise noted.  DIAGNOSTIC FINDINGS:   MR BRAIN WO CONTRASTIMPRESSION: 1. Small acute infarct in the right temporal white matter. 2. 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   Electronically Signed   By: Tiburcio Pea M.D.   On: 06/27/2023 16:08  Procedure: Flexible fiberoptic laryngoscopy with stroboscopy (16109)   Surgeon: Ashok Croon, MDNasopharynx: No mass or lesion with intact mucosa Hypopharynx: Intact mucosa without pooling of secretions Larynx Glottic: Full true vocal cord mobility without lesion or mass, evidence of b/l VF atrophy and glottic insufficiency Supraglottic: Normal appearing epiglottis and AE folds Interarytenoid Space: Moderate pachydermia&edema Subglottic Space: Patent without lesion or edema Neck Neck and Trachea: Midline trachea without mass or lesion Thyroid: No mass or nodularity Lymphatics: No lymphadenopathy  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 No tumors  or masses; vocal cords are clear and mobile. Initial management involves voice therapy to improve lung support and voice projection. If ineffective, procedural interventions to augment vocal cords are available. This is a quality of life issue, not life-threatening. Discussed controlling postnasal drainage with Flonase/antihistamine - Refer to voice therapy - Consider procedural interventions if voice therapy is ineffective   Chronic post-nasal drip and suspected environmental allergies - trial of Flonase 2 puffs b/l nares - continue Xyzal 5 g daily  COGNITION: Overall cognitive status: Impaired and History of cognitive impairments - at baseline Areas of impairment:  Attention: Impaired: Sustained Memory: Impaired: Working Clinical research associate function: Impaired: Slow processing Functional deficits: Wife assist at home with medication management and other self care reminders as needed.  SOCIAL HISTORY: Occupation: Retired Print production planner intake: optimal Caffeine/alcohol intake: minimal Daily voice use: minimal  PERCEPTUAL VOICE ASSESSMENT: Voice quality: hoarse, breathy, and low vocal intensity Vocal abuse: habitual throat clearing Resonance: normal Respiratory function: thoracic breathing  OBJECTIVE VOICE ASSESSMENT: Maximum phonation time for sustained "ah": 6.2 seconds Conversational pitch average: 210.5 Hz Conversational pitch range: 173.7-379.8 Hz Conversational loudness average: 53.5 dB Conversational loudness range: 46-61 dB S/z ratio: 10/6.89= 1.45 (Suggestive of dysfunction >1.0)  PATIENT REPORTED OUTCOME MEASURES (PROM): VHI: 14 and RSI: 15  The Voice Handicap Index-10 (VHI-10) was administered. This survey is a series of questions targeting the patient's perception of his/her own voice using a scale  of 0-4 (0=Never, 4=Always). Score greater than 11 is abnormal. My voice makes it difficult for people to hear me. 3  People have  difficulty understanding me in a noisy room. 3  My voice difficulties restrict personal and social life. 3 I feel left out of conversations because of my voice. 0  My voice problem causes me to lose income. 0  I feel as though I have to strain to produce voice. 0  The clarity of my voice is unpredictable. 2  My voice problem upsets me. 3  My voice makes me feel handicapped. 0  People ask, "What's wrong with your voice?" 0  TOTAL SCORE:  [X]  Abnormal (raw score >11) []  Normal 14/40   Reflux Symptom Index Hoarseness or a problem with your voice 4 Clearing your throat 5 Excess throat mucous or postnasal drip 3 Difficulty swallowing food, liquids, or pills 0 Coughing after you eat or after lying down 0 Breathing difficulties or choking episodes 0 Troublesome or annoying cough 0 Sensation of something sticking in your throat or a lump in your throat 3 Heartburn, chest pain, indigestion, or stomach acid coming up 0 TOTAL SCORE: 15 [X]  Abnormal (raw score >13) []  Normal  Normative data suggests that an RSI of greater than or equal to 13 is clinically significant  Therefore, an RSI > 13 may be indicative of significant reflux disease.    [facility] SLUMS Examination Orientation  2/3  Numeric Problem Solving  0/3  Memory  1/5  Attention 1/2  Thought Organization 1/3  Clock Drawing 2/4  Visuospatial Skills               2/2  Short Story Recall  4/8  Total  13/30     Scoring  High School Education  Less than High School Education   Normal  27-30 25-30  Mild Neurocognitive Disorder 21-26 20-24  Dementia  1-20 1-19                                                                                                                          TREATMENT DATE: 08/23/23    PATIENT EDUCATION: Education details: Plan for voice building/strengthening Person educated: Patient and Spouse Education method: Explanation Education comprehension: needs further education  HOME EXERCISE PROGRAM: Pt  will completed HEP as assigned to facilitate carryover of treatment strategies and techniques in home and community environment with written cues.   GOALS: Goals reviewed with patient? Yes  SHORT TERM GOALS: Target date: 09/25/2023  Pt will Decrease chronic cough by 80% via reduction in laryngeal hypersensitivity. a. Sip liquid instead of coughing, dry swallow, or quick cough and swallow  b. Follow reflux recommendations (if applicable) c. Increase hydration to a minimum of 48+oz of water d. Improve breath support during speech (not speaking to end of breath)  Baseline: 24 oz water per day, throat clearing ~every 30 seconds Goal status: INITIAL  2.  The patient will use a strong, clear voice when communicating  with family, friends and/or colleagues resulting in an 80% reduction in requests for repetition. Baseline: request for repetition 2x/ one minute Goal status: INITIAL  3.  Regularly practice voice building/strengthening exercises a minimum of 5 days/week for a 20+ minutes a day. Baseline: new today Goal status: INITIAL  4.  Patient will maintain a vocal loudness of 75 dB SPL or greater at a 30 cm microphone to mouth distance to communicate effectively during conversational speech while maintaining good quality with minimal cues.  Baseline: 54 dB Goal status: INITIAL  5.  Patient will maintain a maximum phonation duration of 10 seconds or greater with minimal cues during sustained phonation.  Baseline: 6 seconds Goal status: INITIAL  6.  Patient will maintain a vocal loudness of 75 dB or greater during sustained vowel phonation on 80% of trials. Baseline: ~68 dB Goal status: INITIAL  LONG TERM GOALS: Target date: 09/25/2023  Pt will Increase vocal endurance to level needed to communicate with family and friends without vocal fatigue or discomfort. Baseline: requests for repetition and Pt reports poor breath support Goal status: INITIAL    ASSESSMENT:  CLINICAL  IMPRESSION: Patient is a 78 y.o. male who was seen today for a voice evaluation due to VF atrophy and glottic insufficiency after referral from Dr. Irene Pap. Pt presents with mild/moderate hoarse vocal quality with breathy quality, reduced vocal intensity, reduced breath support, moderate cognitive deficits, and mild expressive language deficits. Pt and his wife would like to address his dysphonia, dysarthria, and expressive language deficits so that he can communicate more independently with family and medical professionals. Pt states that he is a "quiet person", but is bothered by his hesitations in speech and frequent throat clearing. Pt's average conversational speech volume is ~54 dB, sustained phonation of 6 seconds, clears his throat nearly every 30 seconds, and drinks approximately 24 ounces of water a day. Recommend SLP therapy to address breath support, vocal loudness, and fluency enhancing strategies.    OBJECTIVE IMPAIRMENTS: include attention, memory, executive functioning, expressive language, dysarthria, and voice disorder. These impairments are limiting patient from managing medications and effectively communicating at home and in community. Factors affecting potential to achieve goals and functional outcome are ability to learn/carryover information, previous level of function, and time post dysphonia onset . Patient will benefit from skilled SLP services to address above impairments and improve overall function.  REHAB POTENTIAL: Fair long standing dysphonia and slow changes in cognitive communication abilities  PLAN:  SLP FREQUENCY: 1-2x/week  SLP DURATION: 4 weeks  PLANNED INTERVENTIONS: Cueing hierachy, Internal/external aids, Functional tasks, Multimodal communication approach, SLP instruction and feedback, Compensatory strategies, Patient/family education, and [id number] Treatment of speech (30 or 45 min)    [employer] Auth Request  Referring diagnosis code (ICD 10)?  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 (if different than referring diagnosis) R49.0 (ICD-10-CM) - Dysphonia and cognitive communication deficit What was this (referring dx) caused by? []  Surgery []  Fall [x]  Ongoing issue []  Arthritis [x]  Other: ____________  Laterality: []  Rt []  Lt [x]  Both  Deficits: []  Pain []  Stiffness [x]  Weakness []  Edema []  Balance Deficits []  Coordination []  Gait Disturbance []  ROM []  Other   Functional Tool Score: N/A  CPT codes: See Planned Interventions listed in the Plan section of the Evaluation.   Thank you,  Havery Moros, CCC-SLP [phone]  Vyom Brass, CCC-SLP 08/23/2023, 11:15 AM

## 2023-08-25 DIAGNOSIS — N1831 Chronic kidney disease, stage 3a: Secondary | ICD-10-CM | POA: Diagnosis not present

## 2023-08-25 DIAGNOSIS — N2581 Secondary hyperparathyroidism of renal origin: Secondary | ICD-10-CM | POA: Diagnosis not present

## 2023-08-25 DIAGNOSIS — I129 Hypertensive chronic kidney disease with stage 1 through stage 4 chronic kidney disease, or unspecified chronic kidney disease: Secondary | ICD-10-CM | POA: Diagnosis not present

## 2023-08-25 DIAGNOSIS — D638 Anemia in other chronic diseases classified elsewhere: Secondary | ICD-10-CM | POA: Diagnosis not present

## 2023-08-25 DIAGNOSIS — I5022 Chronic systolic (congestive) heart failure: Secondary | ICD-10-CM | POA: Diagnosis not present

## 2023-08-26 ENCOUNTER — Encounter (HOSPITAL_COMMUNITY): Payer: Self-pay | Admitting: Speech Pathology

## 2023-08-26 ENCOUNTER — Ambulatory Visit (HOSPITAL_COMMUNITY): Payer: Medicare PPO | Admitting: Speech Pathology

## 2023-08-26 DIAGNOSIS — J383 Other diseases of vocal cords: Secondary | ICD-10-CM | POA: Diagnosis not present

## 2023-08-26 DIAGNOSIS — R490 Dysphonia: Secondary | ICD-10-CM

## 2023-08-26 DIAGNOSIS — R41841 Cognitive communication deficit: Secondary | ICD-10-CM | POA: Diagnosis not present

## 2023-08-26 NOTE — Therapy (Signed)
OUTPATIENT SPEECH LANGUAGE PATHOLOGY VOICE TREATMENT   Patient Name: Joseph [name] MRN: [id number] DOB:1945-12-02, 78 y.o., male Today's Date: 08/26/2023  PCP: Rica Records, FNP REFERRING PROVIDER: Ashok Croon, MD  END OF SESSION:  End of Session - 08/26/23 1256     Visit Number 2    Number of Visits 9    Date for SLP Re-Evaluation 09/23/23    Authorization Type [employer]    SLP Start Time 1015    SLP Stop Time  1100    SLP Time Calculation (min) 45 min    Activity Tolerance Patient tolerated treatment well            Past Medical History:  Diagnosis Date   1st degree AV block    Atrial fibrillation (HCC)    Diastolic dysfunction 4/13   grade 1   Hypertension    New onset atrial fibrillation (HCC) 06/2015   RBBB    Sinus bradycardia    Stroke (HCC)    no defecits, pt denies having a stroke, says Dr Tresa Endo diagnosed this   Past Surgical History:  Procedure Laterality Date   BIV PACEMAKER INSERTION CRT-P N/A 01/26/2022   Procedure: BIV PACEMAKER INSERTION CRT-P;  Surgeon: Marinus Maw, MD;  Location: [facility] INVASIVE CV LAB;  Service: Cardiovascular;  Laterality: N/A;   COLONOSCOPY     COLONOSCOPY  12/30/2011   Procedure: COLONOSCOPY;  Surgeon: Corbin Ade, MD;  Location: [facility];  Service: Endoscopy;  Laterality: N/A;  1:45PM   COLONOSCOPY N/A 04/19/2019   Procedure: COLONOSCOPY;  Surgeon: Corbin Ade, MD;  Location: [facility];  Service: Endoscopy;  Laterality: N/A;  2:00pm   EXCISION MASS LOWER EXTREMETIES Left 05/12/2018   Procedure: Left foot plantar fibroma excision;  Surgeon: Toni Arthurs, MD;  Location: [facility];  Service: Orthopedics;  Laterality: Left;   FOOT SURGERY Left    LEFT HEART CATH AND CORONARY ANGIOGRAPHY N/A 01/26/2022   Procedure: LEFT HEART CATH AND CORONARY ANGIOGRAPHY;  Surgeon: Lennette Bihari, MD;  Location: [facility] INVASIVE CV LAB;  Service: Cardiovascular;  Laterality: N/A;    SPERMATOCELECTOMY Right 11/13/2019   Procedure: EXCISION OF SPERMATIC CORD CYST;  Surgeon: Malen Gauze, MD;  Location: [facility];  Service: Urology;  Laterality: Right;   VASECTOMY  11/13/2019   Procedure: VASECTOMY;  Surgeon: Malen Gauze, MD;  Location: [facility];  Service: Urology;;   Patient Active Problem List   Diagnosis Date Noted   Chronic hoarseness 06/29/2023   Prediabetes 06/29/2023   AKI (acute kidney injury) (HCC) 02/03/2022   NICM (nonischemic cardiomyopathy) (HCC) 02/03/2022   Sinus node dysfunction (HCC)    Atrial flutter (HCC) 01/23/2022   OAB (overactive bladder) 12/27/2020   Nocturia 01/10/2020   Benign prostatic hyperplasia with urinary obstruction 01/10/2020   Non-recurrent unilateral inguinal hernia without obstruction or gangrene    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   History of adenomatous polyp of colon 02/27/2019   Paroxysmal atrial fibrillation (HCC) 07/18/2015   Bradycardia 06/29/2015   Near syncope    Atrial fibrillation with rapid ventricular response (HCC) 06/27/2015   Left ventricular diastolic dysfunction, NYHA class 3 10/26/2014   Kidney cysts 04/04/2013   HTN (hypertension) 03/31/2013    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 03/31/2013   Diastolic dysfunction- grade 2- echo 06/28/15 03/31/2013   RBBB 03/31/2013   AV block, 1st degree 03/31/2013   Sinus bradycardia 03/31/2013   Hematochezia 12/29/2011    Onset date: ~January  2023  REFERRING DIAG: R49.0 (ICD-10-CM) - Dysphonia 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 Ashok Croon (ENT) for voice therapy due to two-year history of progressively worsening  voice changes. Pt accompanied to the appointment by his wife, Darel Hong, who assisted in providing background information. Pt has also been seen by Dr. Marjory Lies (neurology) in October 2024 and was diagnosed with aphasia, dysarthria, and memory loss. Pt and his wife report a "slurring" of his words, difficulty pronouncing words, hoarseness, and stuttering.   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 No  PATIENT GOALS: Improve speaking and voice   Procedure: Flexible fiberoptic laryngoscopy with stroboscopy (16109)   Surgeon: Ashok Croon, MDNasopharynx: No mass or lesion with intact mucosa Hypopharynx: Intact mucosa without pooling of secretions Larynx Glottic: Full true vocal cord mobility without lesion or mass, evidence of b/l VF atrophy and glottic insufficiency Supraglottic: Normal appearing epiglottis and AE folds Interarytenoid Space: Moderate pachydermia&edema Subglottic Space: Patent without lesion or edema Neck Neck and Trachea: Midline trachea without mass or lesion Thyroid: No mass or nodularity Lymphatics: No lymphadenopathy  Strobe today 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 No tumors or masses; vocal cords are clear and mobile. Initial management involves voice therapy to improve lung support and voice projection. If ineffective, procedural interventions to augment vocal cords are available. This is a quality of life issue, not life-threatening. Discussed controlling postnasal drainage with Flonase/antihistamine - Refer to voice therapy - Consider procedural interventions if voice therapy is ineffective   Chronic post-nasal drip and suspected environmental allergies - trial of Flonase 2 puffs b/l nares - continue Xyzal 5 g daily                                                                                                                        TREATMENT  DATE: 08/26/23 Pt seen for verbal fluency strategies, vocal strengthening, and breath support. He was accompanied to therapy by his wife. She reports that he still clears his throat frequently at home, despite reminders to swallow/sip water instead. SLP provided Pt with a cup of water to use throughout our session and he needed mi/mod reminders to reach for the cup instead of clearing his throat. Pt completed sustained phonation of /a/ with 4.7-7.2 seconds with an average of 6 seconds with mod/max cues with average loudness ranging from 68 dB-75 dB with max cues. Pt able to achieve 5 pitch changes with /a/ glides and SLP max support. Pt to continue with vocal strengthening exercises with assist from spouse at home. Continue plan of care and target above goals.  PATIENT EDUCATION: Education details: Provided Pt with written recommendations regarding reducing throat clearing and completing vocal endurance exercises at home  Person educated: Patient and Spouse Education method: Explanation Education comprehension: needs further education  HOME EXERCISE PROGRAM: Pt will completed HEP as assigned to facilitate carryover of treatment strategies and techniques in home and community environment with written cues.   GOALS: Goals reviewed with patient? Yes   SHORT TERM GOALS: Target date: 09/25/2023   Pt will Decrease chronic cough by 80% via reduction in laryngeal hypersensitivity. a. Sip liquid instead of coughing, dry swallow, or quick cough and swallow  b. Follow reflux recommendations (if applicable) c. Increase hydration to a minimum of 48+oz of water d. Improve breath support during speech (not speaking to end of breath)   Baseline: 24 oz water per day, throat clearing ~every 30 seconds Goal status: ONGOING   2.  The patient will use a strong, clear voice when communicating with family, friends and/or colleagues resulting in an 80% reduction in requests for repetition. Baseline: request for  repetition 2x/ one minute Goal status: ONGOING   3.  Regularly practice voice building/strengthening exercises a minimum of 5 days/week for a 20+ minutes a day. Baseline: new today Goal status: ONGOING   4.  Patient will maintain a vocal loudness of 75 dB SPL or greater at a 30 cm microphone to mouth distance to communicate effectively during conversational speech while maintaining good quality with minimal cues.  Baseline: 54 dB Goal status: ONGOING   5.  Patient will maintain a maximum phonation duration of 10 seconds or greater with minimal cues during sustained phonation.  Baseline: 6 seconds Goal status: ONGOING   6.  Patient will maintain a vocal loudness of 75 dB or greater during sustained vowel phonation on 80% of trials. Baseline: ~68 dB Goal status: ONGOING   LONG TERM GOALS: Target date: 09/25/2023   Pt will Increase vocal endurance to level needed to communicate with family and friends without vocal fatigue or discomfort. Baseline: requests for repetition and Pt reports poor breath support Goal status: ONGOING       ASSESSMENT:   CLINICAL IMPRESSION: Patient is a 78 y.o. male who was seen today for a voice evaluation due to VF atrophy and glottic insufficiency after referral from Dr. Irene Pap. Pt presents with mild/moderate hoarse vocal quality with breathy quality, reduced vocal intensity, reduced breath support, moderate cognitive deficits, and mild expressive language deficits. Pt and his wife would like to address his dysphonia, dysarthria, and expressive language deficits so that he can communicate more independently with family and medical professionals. Pt states that he is a "quiet person", but is bothered by his hesitations in speech and frequent throat clearing. Pt's average conversational speech volume is ~54 dB, sustained phonation of 6 seconds, clears his throat nearly every 30 seconds, and drinks approximately 24 ounces of water a day. Recommend SLP therapy to  address breath support, vocal loudness, and fluency enhancing strategies.     OBJECTIVE IMPAIRMENTS: include attention, memory, executive functioning, expressive language, dysarthria, and voice disorder. These impairments are limiting patient from managing medications and effectively communicating at home and in community. Factors affecting potential to achieve goals and functional outcome are ability to learn/carryover information, previous level of function, and time post dysphonia onset . Patient will benefit from skilled SLP services to address above impairments and improve overall function.   REHAB POTENTIAL: Fair long standing dysphonia and slow changes in cognitive communication abilities   PLAN:  SLP FREQUENCY: 1-2x/week   SLP DURATION: 4 weeks   PLANNED INTERVENTIONS: Cueing hierachy, Internal/external aids, Functional tasks, Multimodal communication approach, SLP instruction and feedback, Compensatory strategies, Patient/family education, and 16109 Treatment of speech (30 or 45 min)    Thank you,  Havery Moros, CCC-SLP [phone]  Marijose Curington, CCC-SLP 08/26/2023, 12:56 PM

## 2023-08-31 ENCOUNTER — Ambulatory Visit (HOSPITAL_COMMUNITY): Payer: Medicare PPO | Admitting: Speech Pathology

## 2023-08-31 ENCOUNTER — Encounter (HOSPITAL_COMMUNITY): Payer: Self-pay | Admitting: Speech Pathology

## 2023-08-31 DIAGNOSIS — J383 Other diseases of vocal cords: Secondary | ICD-10-CM | POA: Diagnosis not present

## 2023-08-31 DIAGNOSIS — R490 Dysphonia: Secondary | ICD-10-CM

## 2023-08-31 DIAGNOSIS — R41841 Cognitive communication deficit: Secondary | ICD-10-CM

## 2023-08-31 NOTE — Therapy (Signed)
OUTPATIENT SPEECH LANGUAGE PATHOLOGY VOICE TREATMENT   Patient Name: Joseph [name] MRN: [id number] DOB:12/02/1945, 78 y.o., male Today's Date: 08/31/2023  PCP: Rica Records, FNP REFERRING PROVIDER: Ashok Croon, MD  END OF SESSION:  End of Session - 08/31/23 1541     Visit Number 3    Number of Visits 9    Date for SLP Re-Evaluation 09/23/23    Authorization Type [employer]    SLP Start Time 1530    SLP Stop Time  1615    SLP Time Calculation (min) 45 min    Activity Tolerance Patient tolerated treatment well            Past Medical History:  Diagnosis Date   1st degree AV block    Atrial fibrillation (HCC)    Diastolic dysfunction 4/13   grade 1   Hypertension    New onset atrial fibrillation (HCC) 06/2015   RBBB    Sinus bradycardia    Stroke (HCC)    no defecits, pt denies having a stroke, says Dr Tresa Endo diagnosed this   Past Surgical History:  Procedure Laterality Date   BIV PACEMAKER INSERTION CRT-P N/A 01/26/2022   Procedure: BIV PACEMAKER INSERTION CRT-P;  Surgeon: Marinus Maw, MD;  Location: [facility] INVASIVE CV LAB;  Service: Cardiovascular;  Laterality: N/A;   COLONOSCOPY     COLONOSCOPY  12/30/2011   Procedure: COLONOSCOPY;  Surgeon: Corbin Ade, MD;  [facility];  Service: Endoscopy;  Laterality: N/A;  1:45PM   COLONOSCOPY N/A 04/19/2019   Procedure: COLONOSCOPY;  Surgeon: Corbin Ade, MD;  Location: [facility];  Service: Endoscopy;  Laterality: N/A;  2:00pm   EXCISION MASS LOWER EXTREMETIES Left 05/12/2018   Procedure: Left foot plantar fibroma excision;  Surgeon: Toni Arthurs, MD;  Location: [facility];  Service: Orthopedics;  Laterality: Left;   FOOT SURGERY Left    LEFT HEART CATH AND CORONARY ANGIOGRAPHY N/A 01/26/2022   Procedure: LEFT HEART CATH AND CORONARY ANGIOGRAPHY;  Surgeon: Lennette Bihari, MD;  Location: [facility];  Service: Cardiovascular;  Laterality: N/A;    SPERMATOCELECTOMY Right 11/13/2019   Procedure: EXCISION OF SPERMATIC CORD CYST;  Surgeon: Malen Gauze, MD;  Location: [facility];  Service: Urology;  Laterality: Right;   VASECTOMY  11/13/2019   Procedure: VASECTOMY;  Surgeon: Malen Gauze, MD;  Location: [facility];  Service: Urology;;   Patient Active Problem List   Diagnosis Date Noted   Chronic hoarseness 06/29/2023   Prediabetes 06/29/2023   AKI (acute kidney injury) (HCC) 02/03/2022   NICM (nonischemic cardiomyopathy) (HCC) 02/03/2022   Sinus node dysfunction (HCC)    Atrial flutter (HCC) 01/23/2022   OAB (overactive bladder) 12/27/2020   Nocturia 01/10/2020   Benign prostatic hyperplasia with urinary obstruction 01/10/2020   Non-recurrent unilateral inguinal hernia without obstruction or gangrene    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   History of adenomatous polyp of colon 02/27/2019   Paroxysmal atrial fibrillation (HCC) 07/18/2015   Bradycardia 06/29/2015   Near syncope    Atrial fibrillation with rapid ventricular response (HCC) 06/27/2015   Left ventricular diastolic dysfunction, NYHA class 3 10/26/2014   Kidney cysts 04/04/2013   HTN (hypertension) 03/31/2013    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 03/31/2013   Diastolic dysfunction- grade 2- echo 06/28/15 03/31/2013   RBBB 03/31/2013   AV block, 1st degree 03/31/2013   Sinus bradycardia 03/31/2013   Hematochezia 12/29/2011    Onset date: ~January  2023  REFERRING DIAG: R49.0 (ICD-10-CM) - Dysphonia 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 Ashok Croon (ENT) for voice therapy due to two-year history of progressively worsening  voice changes. Pt accompanied to the appointment by his wife, Darel Hong, who assisted in providing background information. Pt has also been seen by Dr. Marjory Lies (neurology) in October 2024 and was diagnosed with aphasia, dysarthria, and memory loss. Pt and his wife report a "slurring" of his words, difficulty pronouncing words, hoarseness, and stuttering.   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 No  PATIENT GOALS: Improve speaking and voice   Procedure: Flexible fiberoptic laryngoscopy with stroboscopy (16109)   Surgeon: Ashok Croon, MDNasopharynx: No mass or lesion with intact mucosa Hypopharynx: Intact mucosa without pooling of secretions Larynx Glottic: Full true vocal cord mobility without lesion or mass, evidence of b/l VF atrophy and glottic insufficiency Supraglottic: Normal appearing epiglottis and AE folds Interarytenoid Space: Moderate pachydermia&edema Subglottic Space: Patent without lesion or edema Neck Neck and Trachea: Midline trachea without mass or lesion Thyroid: No mass or nodularity Lymphatics: No lymphadenopathy  Strobe today 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 No tumors or masses; vocal cords are clear and mobile. Initial management involves voice therapy to improve lung support and voice projection. If ineffective, procedural interventions to augment vocal cords are available. This is a quality of life issue, not life-threatening. Discussed controlling postnasal drainage with Flonase/antihistamine - Refer to voice therapy - Consider procedural interventions if voice therapy is ineffective   Chronic post-nasal drip and suspected environmental allergies - trial of Flonase 2 puffs b/l nares - continue Xyzal 5 g daily                                                                                                                        PREVIOUS  TREATMENT: Pt seen for verbal fluency strategies, vocal strengthening, and breath support. He was accompanied to therapy by his wife. She reports that he still clears his throat frequently at home, despite reminders to swallow/sip water instead. SLP provided Pt with a cup of water to use throughout our session and he needed mi/mod reminders to reach for the cup instead of clearing his throat. Pt completed sustained phonation of /a/ with 4.7-7.2 seconds with an average of 6 seconds with mod/max cues with average loudness ranging from 68 dB-75 dB with max cues. Pt able to achieve 5 pitch changes with /a/ glides and SLP max support. Pt to continue with vocal strengthening exercises with assist from spouse at home. Continue plan of care and target above goals.  CURRENT TREATMENT:  Pt accompanied to therapy by his wife. She indicates that they practiced at home, but he had difficulty sustaining /a/ for very long. SLP guided Pt through vocal strengthening and breath support exercises with mod assist. Pt demonstrated improved ability to inhale through his nose and exhale out his mouth for a longer duration. Sustained phonation continues to be between 4-6.5 seconds with vocal intensity of 68-78dB. SLP recorded Pt for feedback today. He stated, "something is restricting my throat" and that he has difficulty with "projection". Pt also stated that he is bothered by "stuttering". He was noted to repeat "puh" several times when trying to verbalize "people". He ended up saying, "I am trying to say [id number]". SLP encouraged Pt to silently say the word he intends or gently "bounce" on the repetition. 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 Pt no longer sings at home and he was encouraged to play music he likes and sing along. Continue to target goals. Memory is a barrier to progress toward goals, but he has good support from  him wife.  08/31/23   PATIENT EDUCATION: Education details: Provided Pt with written recommendations regarding reducing throat clearing and completing vocal endurance exercises at home  Person educated: Patient and Spouse Education method: Explanation Education comprehension: needs further education  HOME EXERCISE PROGRAM: Pt will completed HEP as assigned to facilitate carryover of treatment strategies and techniques in home and community environment with written cues.   GOALS: Goals reviewed with patient? Yes   SHORT TERM GOALS: Target date: 09/25/2023   Pt will Decrease chronic cough by 80% via reduction in laryngeal hypersensitivity. a. Sip liquid instead of coughing, dry swallow, or quick cough and swallow  b. Follow reflux recommendations (if applicable) c. Increase hydration to a minimum of 48+oz of water d. Improve breath support during speech (not speaking to end of breath)   Baseline: 24 oz water per day, throat clearing ~every 30 seconds Goal status: ONGOING   2.  The patient will use a strong, clear voice when communicating with family, friends and/or colleagues resulting in an 80% reduction in requests for repetition. Baseline: request for repetition 2x/ one minute Goal status: ONGOING   3.  Regularly practice voice building/strengthening exercises a minimum of 5 days/week for a 20+ minutes a day. Baseline: new today Goal status: ONGOING   4.  Patient will maintain a vocal loudness of 75 dB SPL or greater at a 30 cm microphone to mouth distance to communicate effectively during conversational speech while maintaining good quality with minimal cues.  Baseline: 54 dB Goal status: ONGOING   5.  Patient will maintain a maximum phonation duration of 10 seconds or greater with minimal cues during sustained phonation.  Baseline: 6 seconds Goal status: ONGOING   6.  Patient will maintain a vocal loudness of 75 dB or greater during sustained vowel phonation on 80% of  trials. Baseline: ~68 dB Goal status: ONGOING   LONG TERM GOALS: Target date: 09/25/2023   Pt will Increase vocal endurance to level needed to communicate with family and friends without vocal fatigue or discomfort. Baseline: requests for repetition and Pt reports poor breath support Goal status: ONGOING       ASSESSMENT:   CLINICAL IMPRESSION: Patient is a 78 y.o. male who was seen today for a voice evaluation due to VF atrophy and glottic insufficiency after referral from Dr. Irene Pap. Pt  presents with mild/moderate hoarse vocal quality with breathy quality, reduced vocal intensity, reduced breath support, moderate cognitive deficits, and mild expressive language deficits. Pt and his wife would like to address his dysphonia, dysarthria, and expressive language deficits so that he can communicate more independently with family and medical professionals. Pt states that he is a "quiet person", but is bothered by his hesitations in speech and frequent throat clearing. Pt's average conversational speech volume is ~54 dB, sustained phonation of 6 seconds, clears his throat nearly every 30 seconds, and drinks approximately 24 ounces of water a day. Recommend SLP therapy to address breath support, vocal loudness, and fluency enhancing strategies.     OBJECTIVE IMPAIRMENTS: include attention, memory, executive functioning, expressive language, dysarthria, and voice disorder. These impairments are limiting patient from managing medications and effectively communicating at home and in community. Factors affecting potential to achieve goals and functional outcome are ability to learn/carryover information, previous level of function, and time post dysphonia onset . Patient will benefit from skilled SLP services to address above impairments and improve overall function.   REHAB POTENTIAL: Fair long standing dysphonia and slow changes in cognitive communication abilities   PLAN:   SLP FREQUENCY:  1-2x/week   SLP DURATION: 4 weeks   PLANNED INTERVENTIONS: Cueing hierachy, Internal/external aids, Functional tasks, Multimodal communication approach, SLP instruction and feedback, Compensatory strategies, Patient/family education, and 40981 Treatment of speech (30 or 45 min)    Thank you,  Havery Moros, CCC-SLP [phone]  Rakeen Gaillard, CCC-SLP 08/31/2023, 3:41 PM

## 2023-09-02 ENCOUNTER — Ambulatory Visit (HOSPITAL_COMMUNITY): Payer: Medicare PPO | Admitting: Speech Pathology

## 2023-09-03 DIAGNOSIS — N1831 Chronic kidney disease, stage 3a: Secondary | ICD-10-CM | POA: Diagnosis not present

## 2023-09-03 DIAGNOSIS — I129 Hypertensive chronic kidney disease with stage 1 through stage 4 chronic kidney disease, or unspecified chronic kidney disease: Secondary | ICD-10-CM | POA: Diagnosis not present

## 2023-09-03 DIAGNOSIS — N281 Cyst of kidney, acquired: Secondary | ICD-10-CM | POA: Diagnosis not present

## 2023-09-03 DIAGNOSIS — I5022 Chronic systolic (congestive) heart failure: Secondary | ICD-10-CM | POA: Diagnosis not present

## 2023-09-07 ENCOUNTER — Encounter (HOSPITAL_COMMUNITY): Payer: Self-pay | Admitting: Speech Pathology

## 2023-09-07 ENCOUNTER — Ambulatory Visit (HOSPITAL_COMMUNITY): Payer: Medicare PPO | Admitting: Speech Pathology

## 2023-09-07 DIAGNOSIS — R41841 Cognitive communication deficit: Secondary | ICD-10-CM

## 2023-09-07 DIAGNOSIS — R490 Dysphonia: Secondary | ICD-10-CM | POA: Diagnosis not present

## 2023-09-07 DIAGNOSIS — J383 Other diseases of vocal cords: Secondary | ICD-10-CM | POA: Diagnosis not present

## 2023-09-07 NOTE — Therapy (Signed)
 OUTPATIENT SPEECH LANGUAGE PATHOLOGY VOICE TREATMENT   Patient Name: Joseph [name] MRN: [id number] DOB:1945-12-27, 78 y.o., male Today's Date: 09/07/2023  PCP: Rica Records, FNP REFERRING PROVIDER: Ashok Croon, MD  END OF SESSION:  End of Session - 09/07/23 1531     Visit Number 4    Number of Visits 9    Date for SLP Re-Evaluation 09/23/23    Authorization Type [employer]    SLP Start Time 1521    SLP Stop Time  1606    SLP Time Calculation (min) 45 min    Activity Tolerance Patient tolerated treatment well            Past Medical History:  Diagnosis Date   1st degree AV block    Atrial fibrillation (HCC)    Diastolic dysfunction 4/13   grade 1   Hypertension    New onset atrial fibrillation (HCC) 06/2015   RBBB    Sinus bradycardia    Stroke (HCC)    no defecits, pt denies having a stroke, says Dr Tresa Endo diagnosed this   Past Surgical History:  Procedure Laterality Date   BIV PACEMAKER INSERTION CRT-P N/A 01/26/2022   Procedure: BIV PACEMAKER INSERTION CRT-P;  Surgeon: Marinus Maw, MD;  Location: [facility] INVASIVE CV LAB;  Service: Cardiovascular;  Laterality: N/A;   COLONOSCOPY     COLONOSCOPY  12/30/2011   Procedure: COLONOSCOPY;  Surgeon: Corbin Ade, MD;  Location: [facility];  Service: Endoscopy;  Laterality: N/A;  1:45PM   COLONOSCOPY N/A 04/19/2019   Procedure: COLONOSCOPY;  Surgeon: Corbin Ade, MD;  Location: [facility];  Service: Endoscopy;  Laterality: N/A;  2:00pm   EXCISION MASS LOWER EXTREMETIES Left 05/12/2018   Procedure: Left foot plantar fibroma excision;  Surgeon: Toni Arthurs, MD;  Location: [facility];  Service: Orthopedics;  Laterality: Left;   FOOT SURGERY Left    LEFT HEART CATH AND CORONARY ANGIOGRAPHY N/A 01/26/2022   Procedure: LEFT HEART CATH AND CORONARY ANGIOGRAPHY;  Surgeon: Lennette Bihari, MD;  Location: [facility] INVASIVE CV LAB;  Service: Cardiovascular;  Laterality: N/A;    SPERMATOCELECTOMY Right 11/13/2019   Procedure: EXCISION OF SPERMATIC CORD CYST;  Surgeon: Malen Gauze, MD;  Location: [facility];  Service: Urology;  Laterality: Right;   VASECTOMY  11/13/2019   Procedure: VASECTOMY;  Surgeon: Malen Gauze, MD;  Location: [facility];  Service: Urology;;   Patient Active Problem List   Diagnosis Date Noted   Chronic hoarseness 06/29/2023   Prediabetes 06/29/2023   AKI (acute kidney injury) (HCC) 02/03/2022   NICM (nonischemic cardiomyopathy) (HCC) 02/03/2022   Sinus node dysfunction (HCC)    Atrial flutter (HCC) 01/23/2022   OAB (overactive bladder) 12/27/2020   Nocturia 01/10/2020   Benign prostatic hyperplasia with urinary obstruction 01/10/2020   Non-recurrent unilateral inguinal hernia without obstruction or gangrene    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   History of adenomatous polyp of colon 02/27/2019   Paroxysmal atrial fibrillation (HCC) 07/18/2015   Bradycardia 06/29/2015   Near syncope    Atrial fibrillation with rapid ventricular response (HCC) 06/27/2015   Left ventricular diastolic dysfunction, NYHA class 3 10/26/2014   Kidney cysts 04/04/2013   HTN (hypertension) 03/31/2013    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 03/31/2013   Diastolic dysfunction- grade 2- echo 06/28/15 03/31/2013   RBBB 03/31/2013   AV block, 1st degree 03/31/2013   Sinus bradycardia 03/31/2013   Hematochezia 12/29/2011    Onset date: ~January  2023  REFERRING DIAG: R49.0 (ICD-10-CM) - 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 Ashok Croon (ENT) for voice therapy due to two-year history of progressively  worsening voice changes. Pt accompanied to the appointment by his wife, Joseph [name], who assisted in providing background information. Pt has also been seen by Dr. Marjory Lies (neurology) in October 2024 and was diagnosed with aphasia, dysarthria, and memory loss. Pt and his wife report a "slurring" of his words, difficulty pronouncing words, hoarseness, and stuttering.   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 No  PATIENT GOALS: Improve speaking and voice   Procedure: Flexible fiberoptic laryngoscopy with stroboscopy (96295)   Surgeon: Ashok Croon, MDNasopharynx: No mass or lesion with intact mucosa Hypopharynx: Intact mucosa without pooling of secretions Larynx Glottic: Full true vocal cord mobility without lesion or mass, evidence of b/l VF atrophy and glottic insufficiency Supraglottic: Normal appearing epiglottis and AE folds Interarytenoid Space: Moderate pachydermia&edema Subglottic Space: Patent without lesion or edema Neck Neck and Trachea: Midline trachea without mass or lesion Thyroid: No mass or nodularity Lymphatics: No lymphadenopathy  Strobe today 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 No tumors or masses; vocal cords are clear and mobile. Initial management involves voice therapy to improve lung support and voice projection. If ineffective, procedural interventions to augment vocal cords are available. This is a quality of life issue, not life-threatening. Discussed controlling postnasal drainage with Flonase/antihistamine - Refer to voice therapy - Consider procedural interventions if voice therapy is ineffective   Chronic post-nasal drip and suspected environmental allergies - trial of Flonase 2 puffs b/l nares - continue Xyzal 5 g daily                                                                                                                         PREVIOUS TREATMENT: Pt seen for verbal fluency strategies, vocal strengthening, and breath support. He was accompanied to therapy by his wife. She reports that he still clears his throat frequently at home, despite reminders to swallow/sip water instead. SLP provided Pt with a cup of water to use throughout our session and he needed mi/mod reminders to reach for the cup instead of clearing his throat. Pt completed sustained phonation of /a/ with 4.7-7.2 seconds with an average of 6 seconds with mod/max cues with average loudness ranging from 68 dB-75 dB with max cues. Pt able to achieve 5 pitch changes with /a/ glides and SLP max support. Pt to continue with vocal strengthening exercises with assist from spouse at home. Continue plan of care and target above goals.  PREVIOUS  TREATMENT: Pt accompanied to therapy by his wife. She indicates that they practiced at home, but he had difficulty sustaining /a/ for very long. SLP guided Pt through vocal strengthening and breath support exercises with mod assist. Pt demonstrated improved ability to inhale through his nose and exhale out his mouth for a longer duration. Sustained phonation continues to be between 4-6.5 seconds with vocal intensity of 68-78dB. SLP recorded Pt for feedback today. He stated, "something is restricting my throat" and that he has difficulty with "projection". Pt also stated that he is bothered by "stuttering". He was noted to repeat "puh" several times when trying to verbalize "people". He ended up saying, "I am trying to say [id number]". SLP encouraged Pt to silently say the word he intends or gently "bounce" on the repetition. 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 Pt no longer sings at home and he was encouraged to play music he likes and sing along. Continue to target goals. Memory is a barrier to progress toward goals, but he has good  support from him wife.   CURRENT TREATMENT: Pt accompanied to therapy by his wife. He has not had any more nosebleeds. Subjectively, his voice sounds more hoarse to me today. His wife stated that he had a "strong, clear voice yesterday" and "no stuttering". Pt noted to only exhibit mild initial sound repetitions/dysfluency today in session. SLP led Pt through vocal building and endurance exercises in session and pt return demonstrated. SLP also recorded a sample session of exercises on the Pt's phone so that he can practice along at home. He indicates that he practices at home, but not every day. He continues to have difficulty with sustained phonation tasks with a max of 6 seconds in duration. He continues to clear his throat and he left his water in the car. He was asked to describe action photos by first thinking of a sentence in his head and then verbalizing in a clear and projected voice. Minimal dysfluencies noted and Pt with adequate projection. Pt was encouraged to practice his voice exercises daily. They have a scheduling conflict this Thursday and will not be able to attend this week.  09/07/23   PATIENT EDUCATION: Education details: Provided Pt with written recommendations regarding reducing throat clearing and completing vocal endurance exercises at home  Person educated: Patient and Spouse Education method: Explanation Education comprehension: needs further education  HOME EXERCISE PROGRAM: Pt will completed HEP as assigned to facilitate carryover of treatment strategies and techniques in home and community environment with written cues.   GOALS: Goals reviewed with patient? Yes   SHORT TERM GOALS: Target date: 09/25/2023   Pt will Decrease chronic cough by 80% via reduction in laryngeal hypersensitivity. a. Sip liquid instead of coughing, dry swallow, or quick cough and swallow  b. Follow reflux recommendations (if applicable) c. Increase hydration to a minimum of 48+oz of  water d. Improve breath support during speech (not speaking to end of breath)   Baseline: 24 oz water per day, throat clearing ~every 30 seconds Goal status: ONGOING   2.  The patient will use a strong, clear voice when communicating with family, friends and/or colleagues resulting in an 80% reduction in requests for repetition. Baseline: request for repetition 2x/ one minute Goal status: ONGOING   3.  Regularly practice voice building/strengthening exercises a minimum of 5 days/week for a 20+ minutes a  day. Baseline: new today Goal status: ONGOING   4.  Patient will maintain a vocal loudness of 75 dB SPL or greater at a 30 cm microphone to mouth distance to communicate effectively during conversational speech while maintaining good quality with minimal cues.  Baseline: 54 dB Goal status: ONGOING   5.  Patient will maintain a maximum phonation duration of 10 seconds or greater with minimal cues during sustained phonation.  Baseline: 6 seconds Goal status: ONGOING   6.  Patient will maintain a vocal loudness of 75 dB or greater during sustained vowel phonation on 80% of trials. Baseline: ~68 dB Goal status: ONGOING   LONG TERM GOALS: Target date: 09/25/2023   Pt will Increase vocal endurance to level needed to communicate with family and friends without vocal fatigue or discomfort. Baseline: requests for repetition and Pt reports poor breath support Goal status: ONGOING       ASSESSMENT:   CLINICAL IMPRESSION: Patient is a 78 y.o. male who was seen today for a voice evaluation due to VF atrophy and glottic insufficiency after referral from Dr. Irene Pap. Pt presents with mild/moderate hoarse vocal quality with breathy quality, reduced vocal intensity, reduced breath support, moderate cognitive deficits, and mild expressive language deficits. Pt and his wife would like to address his dysphonia, dysarthria, and expressive language deficits so that he can communicate more  independently with family and medical professionals. Pt states that he is a "quiet person", but is bothered by his hesitations in speech and frequent throat clearing. Pt's average conversational speech volume is ~54 dB, sustained phonation of 6 seconds, clears his throat nearly every 30 seconds, and drinks approximately 24 ounces of water a day. Recommend SLP therapy to address breath support, vocal loudness, and fluency enhancing strategies.     OBJECTIVE IMPAIRMENTS: include attention, memory, executive functioning, expressive language, dysarthria, and voice disorder. These impairments are limiting patient from managing medications and effectively communicating at home and in community. Factors affecting potential to achieve goals and functional outcome are ability to learn/carryover information, previous level of function, and time post dysphonia onset . Patient will benefit from skilled SLP services to address above impairments and improve overall function.   REHAB POTENTIAL: Fair long standing dysphonia and slow changes in cognitive communication abilities   PLAN:   SLP FREQUENCY: 1-2x/week   SLP DURATION: 4 weeks   PLANNED INTERVENTIONS: Cueing hierachy, Internal/external aids, Functional tasks, Multimodal communication approach, SLP instruction and feedback, Compensatory strategies, Patient/family education, and 52841 Treatment of speech (30 or 45 min)    Thank you,  Havery Moros, CCC-SLP [phone]  Sheetal Lyall, CCC-SLP 09/07/2023, 3:43 PM

## 2023-09-08 NOTE — Progress Notes (Signed)
 Remote pacemaker transmission.

## 2023-09-09 ENCOUNTER — Ambulatory Visit (HOSPITAL_COMMUNITY): Payer: Medicare PPO | Admitting: Speech Pathology

## 2023-09-09 ENCOUNTER — Telehealth: Payer: Self-pay

## 2023-09-09 NOTE — Telephone Encounter (Signed)
 Patient called and left a voicemail per the front office staff regarding a new patient appointment. I made them aware that the patient was last seen in September of 2024 with Dr. Ronne Binning and doe snot require a new patient appointment just a follow up office visit.

## 2023-09-14 ENCOUNTER — Ambulatory Visit (HOSPITAL_COMMUNITY): Payer: Medicare PPO | Attending: Otolaryngology | Admitting: Speech Pathology

## 2023-09-14 ENCOUNTER — Encounter (HOSPITAL_COMMUNITY): Payer: Self-pay | Admitting: Speech Pathology

## 2023-09-14 DIAGNOSIS — R41841 Cognitive communication deficit: Secondary | ICD-10-CM | POA: Insufficient documentation

## 2023-09-14 DIAGNOSIS — R490 Dysphonia: Secondary | ICD-10-CM | POA: Diagnosis not present

## 2023-09-14 NOTE — Therapy (Signed)
 OUTPATIENT SPEECH LANGUAGE PATHOLOGY VOICE TREATMENT   Patient Name: Joseph [name] MRN: [id number] DOB:07-29-1945, 78 y.o., male Today's Date: 09/14/2023  PCP: Rica Records, FNP REFERRING PROVIDER: Ashok Croon, MD  END OF SESSION:  End of Session - 09/14/23 1542     Visit Number 5    Number of Visits 9    Date for SLP Re-Evaluation 09/23/23    Authorization Type [employer]    SLP Start Time 1525    SLP Stop Time  1605    SLP Time Calculation (min) 40 min    Activity Tolerance Patient tolerated treatment well            Past Medical History:  Diagnosis Date   1st degree AV block    Atrial fibrillation (HCC)    Diastolic dysfunction 4/13   grade 1   Hypertension    New onset atrial fibrillation (HCC) 06/2015   RBBB    Sinus bradycardia    Stroke (HCC)    no defecits, pt denies having a stroke, says Dr Tresa Endo diagnosed this   Past Surgical History:  Procedure Laterality Date   BIV PACEMAKER INSERTION CRT-P N/A 01/26/2022   Procedure: BIV PACEMAKER INSERTION CRT-P;  Surgeon: Marinus Maw, MD;  Location: [facility] INVASIVE CV LAB;  Service: Cardiovascular;  Laterality: N/A;   COLONOSCOPY     COLONOSCOPY  12/30/2011   Procedure: COLONOSCOPY;  Surgeon: Corbin Ade, MD;  Location: [facility];  Service: Endoscopy;  Laterality: N/A;  1:45PM   COLONOSCOPY N/A 04/19/2019   Procedure: COLONOSCOPY;  Surgeon: Corbin Ade, MD;  Location: [facility];  Service: Endoscopy;  Laterality: N/A;  2:00pm   EXCISION MASS LOWER EXTREMETIES Left 05/12/2018   Procedure: Left foot plantar fibroma excision;  Surgeon: Toni Arthurs, MD;  Location: [facility];  Service: Orthopedics;  Laterality: Left;   FOOT SURGERY Left    LEFT HEART CATH AND CORONARY ANGIOGRAPHY N/A 01/26/2022   Procedure: LEFT HEART CATH AND CORONARY ANGIOGRAPHY;  Surgeon: Lennette Bihari, MD;  Location: [facility] INVASIVE CV LAB;  Service: Cardiovascular;  Laterality: N/A;    SPERMATOCELECTOMY Right 11/13/2019   Procedure: EXCISION OF SPERMATIC CORD CYST;  Surgeon: Malen Gauze, MD;  Location: [facility];  Service: Urology;  Laterality: Right;   VASECTOMY  11/13/2019   Procedure: VASECTOMY;  Surgeon: Malen Gauze, MD;  Location: [facility];  Service: Urology;;   Patient Active Problem List   Diagnosis Date Noted   Chronic hoarseness 06/29/2023   Prediabetes 06/29/2023   AKI (acute kidney injury) (HCC) 02/03/2022   NICM (nonischemic cardiomyopathy) (HCC) 02/03/2022   Sinus node dysfunction (HCC)    Atrial flutter (HCC) 01/23/2022   OAB (overactive bladder) 12/27/2020   Nocturia 01/10/2020   Benign prostatic hyperplasia with urinary obstruction 01/10/2020   Non-recurrent unilateral inguinal hernia without obstruction or gangrene    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   History of adenomatous polyp of colon 02/27/2019   Paroxysmal atrial fibrillation (HCC) 07/18/2015   Bradycardia 06/29/2015   Near syncope    Atrial fibrillation with rapid ventricular response (HCC) 06/27/2015   Left ventricular diastolic dysfunction, NYHA class 3 10/26/2014   Kidney cysts 04/04/2013   HTN (hypertension) 03/31/2013    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 03/31/2013   Diastolic dysfunction- grade 2- echo 06/28/15 03/31/2013   RBBB 03/31/2013   AV block, 1st degree 03/31/2013   Sinus bradycardia 03/31/2013   Hematochezia 12/29/2011    Onset date: ~January  2023  REFERRING DIAG: R49.0 (ICD-10-CM) - Dysphonia 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 Ashok Croon (ENT) for voice therapy due to two-year history of progressively  worsening voice changes. Pt accompanied to the appointment by his wife, Joseph [name], who assisted in providing background information. Pt has also been seen by Dr. Marjory Lies (neurology) in October 2024 and was diagnosed with aphasia, dysarthria, and memory loss. Pt and his wife report a "slurring" of his words, difficulty pronouncing words, hoarseness, and stuttering.   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 No  PATIENT GOALS: Improve speaking and voice   Procedure: Flexible fiberoptic laryngoscopy with stroboscopy (16109)   Surgeon: Ashok Croon, MDNasopharynx: No mass or lesion with intact mucosa Hypopharynx: Intact mucosa without pooling of secretions Larynx Glottic: Full true vocal cord mobility without lesion or mass, evidence of b/l VF atrophy and glottic insufficiency Supraglottic: Normal appearing epiglottis and AE folds Interarytenoid Space: Moderate pachydermia&edema Subglottic Space: Patent without lesion or edema Neck Neck and Trachea: Midline trachea without mass or lesion Thyroid: No mass or nodularity Lymphatics: No lymphadenopathy  Strobe today 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 No tumors or masses; vocal cords are clear and mobile. Initial management involves voice therapy to improve lung support and voice projection. If ineffective, procedural interventions to augment vocal cords are available. This is a quality of life issue, not life-threatening. Discussed controlling postnasal drainage with Flonase/antihistamine - Refer to voice therapy - Consider procedural interventions if voice therapy is ineffective   Chronic post-nasal drip and suspected environmental allergies - trial of Flonase 2 puffs b/l nares - continue Xyzal 5 g daily  PREVIOUS TREATMENT:  Pt accompanied to therapy by his wife. He has not had any more nosebleeds. Subjectively, his voice  sounds more hoarse to me today. His wife stated that he had a "strong, clear voice yesterday" and "no stuttering". Pt noted to only exhibit mild initial sound repetitions/dysfluency today in session. SLP led Pt through vocal building and endurance exercises in session and pt return demonstrated. SLP also recorded a sample session of exercises on the Pt's phone so that he can practice along at home. He indicates that he practices at home, but not every day. He continues to have difficulty with sustained phonation tasks with a max of 6 seconds in duration. He continues to clear his throat and he left his water in the car. He was asked to describe action photos by first thinking of a sentence in his head and then verbalizing in a clear and projected voice. Minimal dysfluencies noted and Pt with adequate projection. Pt was encouraged to practice his voice exercises daily. They have a scheduling conflict this Thursday and will not be able to attend this week.  CURRENT TREATMENT:  Pt's wife waited in the car today to have him come to therapy alone. He frequently cleared his throat during the first 10 minutes of the session and required mod/max cues to refrain and either swallow, sip of water, or produce "Huh". SLP led Pt through vocal strengthening exercises with mod/max cues. Pt exhibited hoarse vocal quality, frequent  throat clearing, occasional dysfluency, and occasional word finding difficulties. SLP encouraged easy onset phonation and use of word description tasks (also cued to stop and look away/distract and return to task). Clear phonation observed at times with "num, num" and "yaya yaya" + counting tasks. Pt cued to replenish breath every 3-4 words during oral reading of sentences. He was given those to practice at home. Continue targeting goals.  09/14/23   PATIENT EDUCATION: Education details: Provided Pt with written recommendations regarding reducing throat clearing and completing vocal endurance  exercises at home  Person educated: Patient and Spouse Education method: Explanation Education comprehension: needs further education  HOME EXERCISE PROGRAM: Pt will completed HEP as assigned to facilitate carryover of treatment strategies and techniques in home and community environment with written cues.   GOALS: Goals reviewed with patient? Yes   SHORT TERM GOALS: Target date: 09/25/2023   Pt will Decrease chronic cough by 80% via reduction in laryngeal hypersensitivity. a. Sip liquid instead of coughing, dry swallow, or quick cough and swallow  b. Follow reflux recommendations (if applicable) c. Increase hydration to a minimum of 48+oz of water d. Improve breath support during speech (not speaking to end of breath)   Baseline: 24 oz water per day, throat clearing ~every 30 seconds Goal status: ONGOING   2.  The patient will use a strong, clear voice when communicating with family, friends and/or colleagues resulting in an 80% reduction in requests for repetition. Baseline: request for repetition 2x/ one minute Goal status: ONGOING   3.  Regularly practice voice building/strengthening exercises a minimum of 5 days/week for a 20+ minutes a day. Baseline: new today Goal status: ONGOING   4.  Patient will maintain a vocal loudness of 75 dB SPL or greater at a 30 cm microphone to mouth distance to communicate effectively during conversational speech while maintaining good quality with minimal cues.  Baseline: 54 dB Goal status: ONGOING   5.  Patient will maintain a maximum phonation duration of 10 seconds or greater with minimal cues during sustained phonation.  Baseline: 6 seconds Goal status: ONGOING   6.  Patient will maintain a vocal loudness of 75 dB or greater during sustained vowel phonation on 80% of trials. Baseline: ~68 dB Goal status: ONGOING   LONG TERM GOALS: Target date: 09/25/2023   Pt will Increase vocal endurance to level needed to communicate with family  and friends without vocal fatigue or discomfort. Baseline: requests for repetition and Pt reports poor breath support Goal status: ONGOING       ASSESSMENT:   CLINICAL IMPRESSION: Patient is a 78 y.o. male who was seen today for a voice evaluation due to VF atrophy and glottic insufficiency after referral from Dr. Irene Pap. Pt presents with mild/moderate hoarse vocal quality with breathy quality, reduced vocal intensity, reduced breath support, moderate cognitive deficits, and mild expressive language deficits. Pt and his wife would like to address his dysphonia, dysarthria, and expressive language deficits so that he can communicate more independently with family and medical professionals. Pt states that he is a "quiet person", but is bothered by his hesitations in speech and frequent throat clearing. Pt's average conversational speech volume is ~54 dB, sustained phonation of 6 seconds, clears his throat nearly every 30 seconds, and drinks approximately 24 ounces of water a day. Recommend SLP therapy to address breath support, vocal loudness, and fluency enhancing strategies.     OBJECTIVE IMPAIRMENTS: include attention, memory, executive functioning, expressive language, dysarthria, and voice disorder. These impairments  are limiting patient from managing medications and effectively communicating at home and in community. Factors affecting potential to achieve goals and functional outcome are ability to learn/carryover information, previous level of function, and time post dysphonia onset . Patient will benefit from skilled SLP services to address above impairments and improve overall function.   REHAB POTENTIAL: Fair long standing dysphonia and slow changes in cognitive communication abilities   PLAN:   SLP FREQUENCY: 1-2x/week   SLP DURATION: 4 weeks   PLANNED INTERVENTIONS: Cueing hierachy, Internal/external aids, Functional tasks, Multimodal communication approach, SLP instruction and  feedback, Compensatory strategies, Patient/family education, and 16109 Treatment of speech (30 or 45 min)    Thank you,  Havery Moros, CCC-SLP [phone]  Emmanuel Ercole, CCC-SLP 09/14/2023, 3:44 PM

## 2023-09-16 ENCOUNTER — Encounter (HOSPITAL_COMMUNITY): Payer: Self-pay | Admitting: Speech Pathology

## 2023-09-16 ENCOUNTER — Ambulatory Visit (HOSPITAL_COMMUNITY): Payer: Medicare PPO | Admitting: Speech Pathology

## 2023-09-16 DIAGNOSIS — R490 Dysphonia: Secondary | ICD-10-CM | POA: Diagnosis not present

## 2023-09-16 DIAGNOSIS — R41841 Cognitive communication deficit: Secondary | ICD-10-CM | POA: Diagnosis not present

## 2023-09-16 NOTE — Therapy (Signed)
 OUTPATIENT SPEECH LANGUAGE PATHOLOGY VOICE TREATMENT   Patient Name: Joseph [name] MRN: [id number] DOB:[birth year], 78 y.o., male Today's Date: 09/16/2023  PCP: Rica Records, FNP REFERRING PROVIDER: Ashok Croon, MD  END OF SESSION:  End of Session - 09/16/23 1116     Visit Number 6    Number of Visits 9    Date for SLP Re-Evaluation 09/23/23    Authorization Type [employer]    SLP Start Time 1100    SLP Stop Time  1145    SLP Time Calculation (min) 45 min    Activity Tolerance Patient tolerated treatment well            Past Medical History:  Diagnosis Date   1st degree AV block    Atrial fibrillation (HCC)    Diastolic dysfunction 4/13   grade 1   Hypertension    New onset atrial fibrillation (HCC) 06/2015   RBBB    Sinus bradycardia    Stroke (HCC)    no defecits, pt denies having a stroke, says Dr Tresa Endo diagnosed this   Past Surgical History:  Procedure Laterality Date   BIV PACEMAKER INSERTION CRT-P N/A 01/26/2022   Procedure: BIV PACEMAKER INSERTION CRT-P;  Surgeon: Marinus Maw, MD;  Location: [facility] INVASIVE CV LAB;  Service: Cardiovascular;  Laterality: N/A;   COLONOSCOPY     COLONOSCOPY  12/30/2011   Procedure: COLONOSCOPY;  Surgeon: Corbin Ade, MD;  [facility]: [facility];  Service: Endoscopy;  Laterality: N/A;  1:45PM   COLONOSCOPY N/A 04/19/2019   Procedure: COLONOSCOPY;  Surgeon: Corbin Ade, MD;  Location: [facility];  Service: Endoscopy;  Laterality: N/A;  2:00pm   EXCISION MASS LOWER EXTREMETIES Left 05/12/2018   Procedure: Left foot plantar fibroma excision;  Surgeon: Toni Arthurs, MD;  Location: [facility];  Service: Orthopedics;  Laterality: Left;   FOOT SURGERY Left    LEFT HEART CATH AND CORONARY ANGIOGRAPHY N/A 01/26/2022   Procedure: LEFT HEART CATH AND CORONARY ANGIOGRAPHY;  Surgeon: Lennette Bihari, MD;  Location: [facility];  Service: Cardiovascular;  Laterality: N/A;    SPERMATOCELECTOMY Right 11/13/2019   Procedure: EXCISION OF SPERMATIC CORD CYST;  Surgeon: Malen Gauze, MD;  Location: [facility];  Service: Urology;  Laterality: Right;   VASECTOMY  11/13/2019   Procedure: VASECTOMY;  Surgeon: Malen Gauze, MD;  Location: [facility];  Service: Urology;;   Patient Active Problem List   Diagnosis Date Noted   Chronic hoarseness 06/29/2023   Prediabetes 06/29/2023   AKI (acute kidney injury) (HCC) 02/03/2022   NICM (nonischemic cardiomyopathy) (HCC) 02/03/2022   Sinus node dysfunction (HCC)    Atrial flutter (HCC) 01/23/2022   OAB (overactive bladder) 12/27/2020   Nocturia 01/10/2020   Benign prostatic hyperplasia with urinary obstruction 01/10/2020   Non-recurrent unilateral inguinal hernia without obstruction or gangrene    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   History of adenomatous polyp of colon 02/27/2019   Paroxysmal atrial fibrillation (HCC) 07/18/2015   Bradycardia 06/29/2015   Near syncope    Atrial fibrillation with rapid ventricular response (HCC) 06/27/2015   Left ventricular diastolic dysfunction, NYHA class 3 10/26/2014   Kidney cysts 04/04/2013   HTN (hypertension) 03/31/2013    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 03/31/2013   Diastolic dysfunction- grade 2- echo 06/28/15 03/31/2013   RBBB 03/31/2013   AV block, 1st degree 03/31/2013   Sinus bradycardia 03/31/2013   Hematochezia 12/29/2011    Onset date: ~January  2023  REFERRING DIAG: R49.0 (ICD-10-CM) - Dysphonia 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 Ashok Croon (ENT) for voice therapy due to two-year history of  progressively worsening voice changes. Pt accompanied to the appointment by his wife, Joseph [name], who assisted in providing background information. Pt has also been seen by Dr. Marjory Lies (neurology) in October 2024 and was diagnosed with aphasia, dysarthria, and memory loss. Pt and his wife report a "slurring" of his words, difficulty pronouncing words, hoarseness, and stuttering.   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 No  PATIENT GOALS: Improve speaking and voice   Procedure: Flexible fiberoptic laryngoscopy with stroboscopy (16109)   Surgeon: Ashok Croon, MDNasopharynx: No mass or lesion with intact mucosa Hypopharynx: Intact mucosa without pooling of secretions Larynx Glottic: Full true vocal cord mobility without lesion or mass, evidence of b/l VF atrophy and glottic insufficiency Supraglottic: Normal appearing epiglottis and AE folds Interarytenoid Space: Moderate pachydermia&edema Subglottic Space: Patent without lesion or edema Neck Neck and Trachea: Midline trachea without mass or lesion Thyroid: No mass or nodularity Lymphatics: No lymphadenopathy  Strobe today 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 No tumors or masses; vocal cords are clear and mobile. Initial management involves voice therapy to improve lung support and voice projection. If ineffective, procedural interventions to augment vocal cords are available. This is a quality of life issue, not life-threatening. Discussed controlling postnasal drainage with Flonase/antihistamine - Refer to voice therapy - Consider procedural interventions if voice therapy is ineffective   Chronic post-nasal drip and suspected environmental allergies - trial of Flonase 2 puffs b/l nares - continue Xyzal 5 g daily  PREVIOUS TREATMENT:  Pt's wife waited in the car today to have him come to therapy alone. He frequently  cleared his throat during the first 10 minutes of the session and required mod/max cues to refrain and either swallow, sip of water, or produce "Huh". SLP led Pt through vocal strengthening exercises with mod/max cues. Pt exhibited hoarse vocal quality, frequent throat clearing, occasional dysfluency, and occasional word finding difficulties. SLP encouraged easy onset phonation and use of word description tasks (also cued to stop and look away/distract and return to task). Clear phonation observed at times with "num, num" and "yaya yaya" + counting tasks. Pt cued to replenish breath every 3-4 words during oral reading of sentences. He was given those to practice at home. Continue targeting goals.  CURRENT TREATMENT: Pt accompanied to therapy by his wife. SLP reviewed the previous session and inquired how they felt things were going at home regarding his speech/voice. His wife indicates that she does see "some improvement", however the Pt denies seeing improvement. His wife reports that practice at home is limited. SLP stated that the two things that will likely help the Pt most would be to reduce throat clearing and practice his voice exercises at home. Throat clearing was limited today and Pt was encouraged to produce a quick cough or take a sip of liquid instead. He continues to present with reduced breath support with maximum sustained phonation of 6 seconds (with /  s/ also). SLP modeled resonant voice therapy techniques and Pt able to imitate and he does achieve slight improvement in quality (reduced tension and breathiness/hoarseness), however this is short acting and difficult for him to carryover into conversation. SLP modeled the "num, num, hello, yaya" and then produced sentences for Pt to imitate. He was encouraged to practice the same for HEP. Continue to target goals.    09/16/23   PATIENT EDUCATION: Education details: Provided Pt with written recommendations regarding reducing throat clearing and  completing vocal endurance exercises at home  Person educated: Patient and Spouse Education method: Explanation Education comprehension: needs further education  HOME EXERCISE PROGRAM: Pt will completed HEP as assigned to facilitate carryover of treatment strategies and techniques in home and community environment with written cues.   GOALS: Goals reviewed with patient? Yes   SHORT TERM GOALS: Target date: 09/25/2023   Pt will Decrease chronic cough by 80% via reduction in laryngeal hypersensitivity. a. Sip liquid instead of coughing, dry swallow, or quick cough and swallow  b. Follow reflux recommendations (if applicable) c. Increase hydration to a minimum of 48+oz of water d. Improve breath support during speech (not speaking to end of breath)   Baseline: 24 oz water per day, throat clearing ~every 30 seconds Goal status: ONGOING   2.  The patient will use a strong, clear voice when communicating with family, friends and/or colleagues resulting in an 80% reduction in requests for repetition. Baseline: request for repetition 2x/ one minute Goal status: ONGOING   3.  Regularly practice voice building/strengthening exercises a minimum of 5 days/week for a 20+ minutes a day. Baseline: new today Goal status: ONGOING   4.  Patient will maintain a vocal loudness of 75 dB SPL or greater at a 30 cm microphone to mouth distance to communicate effectively during conversational speech while maintaining good quality with minimal cues.  Baseline: 54 dB Goal status: ONGOING   5.  Patient will maintain a maximum phonation duration of 10 seconds or greater with minimal cues during sustained phonation.  Baseline: 6 seconds Goal status: ONGOING   6.  Patient will maintain a vocal loudness of 75 dB or greater during sustained vowel phonation on 80% of trials. Baseline: ~68 dB Goal status: ONGOING   LONG TERM GOALS: Target date: 09/25/2023   Pt will Increase vocal endurance to level needed to  communicate with family and friends without vocal fatigue or discomfort. Baseline: requests for repetition and Pt reports poor breath support Goal status: ONGOING       ASSESSMENT:   CLINICAL IMPRESSION: Patient is a 78 y.o. male who was seen today for a voice evaluation due to VF atrophy and glottic insufficiency after referral from Dr. Irene Pap. Pt presents with mild/moderate hoarse vocal quality with breathy quality, reduced vocal intensity, reduced breath support, moderate cognitive deficits, and mild expressive language deficits. Pt and his wife would like to address his dysphonia, dysarthria, and expressive language deficits so that he can communicate more independently with family and medical professionals. Pt states that he is a "quiet person", but is bothered by his hesitations in speech and frequent throat clearing. Pt's average conversational speech volume is ~54 dB, sustained phonation of 6 seconds, clears his throat nearly every 30 seconds, and drinks approximately 24 ounces of water a day. Recommend SLP therapy to address breath support, vocal loudness, and fluency enhancing strategies.     OBJECTIVE IMPAIRMENTS: include attention, memory, executive functioning, expressive language, dysarthria, and voice disorder. These impairments are  limiting patient from managing medications and effectively communicating at home and in community. Factors affecting potential to achieve goals and functional outcome are ability to learn/carryover information, previous level of function, and time post dysphonia onset . Patient will benefit from skilled SLP services to address above impairments and improve overall function.   REHAB POTENTIAL: Fair long standing dysphonia and slow changes in cognitive communication abilities   PLAN:   SLP FREQUENCY: 1-2x/week   SLP DURATION: 4 weeks   PLANNED INTERVENTIONS: Cueing hierachy, Internal/external aids, Functional tasks, Multimodal communication approach,  SLP instruction and feedback, Compensatory strategies, Patient/family education, and 40981 Treatment of speech (30 or 45 min)    Thank you,  Havery Moros, CCC-SLP [phone]  Resean Brander, CCC-SLP 09/16/2023, 11:27 AM

## 2023-09-21 ENCOUNTER — Encounter (HOSPITAL_COMMUNITY): Payer: Self-pay | Admitting: Speech Pathology

## 2023-09-21 ENCOUNTER — Ambulatory Visit (HOSPITAL_COMMUNITY): Payer: Medicare PPO | Admitting: Speech Pathology

## 2023-09-21 DIAGNOSIS — R41841 Cognitive communication deficit: Secondary | ICD-10-CM | POA: Diagnosis not present

## 2023-09-21 DIAGNOSIS — R490 Dysphonia: Secondary | ICD-10-CM | POA: Diagnosis not present

## 2023-09-21 NOTE — Therapy (Signed)
 OUTPATIENT SPEECH LANGUAGE PATHOLOGY VOICE TREATMENT   Patient Name: Joseph [name] MRN: [id number] DOB:07-Aug-1945, 78 y.o., male Today's Date: 09/21/2023  PCP: Rica Records, FNP REFERRING PROVIDER: Ashok Croon, MD  END OF SESSION:  End of Session - 09/21/23 1524     Visit Number 7    Number of Visits 9    Date for SLP Re-Evaluation 09/23/23    Authorization Type [employer]    SLP Start Time 1519    SLP Stop Time  1600    SLP Time Calculation (min) 41 min    Activity Tolerance Patient tolerated treatment well            Past Medical History:  Diagnosis Date   1st degree AV block    Atrial fibrillation (HCC)    Diastolic dysfunction 4/13   grade 1   Hypertension    New onset atrial fibrillation (HCC) 06/2015   RBBB    Sinus bradycardia    Stroke (HCC)    no defecits, pt denies having a stroke, says Dr Tresa Endo diagnosed this   Past Surgical History:  Procedure Laterality Date   BIV PACEMAKER INSERTION CRT-P N/A 01/26/2022   Procedure: BIV PACEMAKER INSERTION CRT-P;  Surgeon: Marinus Maw, MD;  Location: [facility] INVASIVE CV LAB;  Service: Cardiovascular;  Laterality: N/A;   COLONOSCOPY     COLONOSCOPY  12/30/2011   Procedure: COLONOSCOPY;  Surgeon: Corbin Ade, MD;  Location: [facility];  Service: Endoscopy;  Laterality: N/A;  1:45PM   COLONOSCOPY N/A 04/19/2019   Procedure: COLONOSCOPY;  Surgeon: Corbin Ade, MD;  Location: [facility];  Service: Endoscopy;  Laterality: N/A;  2:00pm   EXCISION MASS LOWER EXTREMETIES Left 05/12/2018   Procedure: Left foot plantar fibroma excision;  Surgeon: Toni Arthurs, MD;  Location: [facility];  Service: Orthopedics;  Laterality: Left;   FOOT SURGERY Left    LEFT HEART CATH AND CORONARY ANGIOGRAPHY N/A 01/26/2022   Procedure: LEFT HEART CATH AND CORONARY ANGIOGRAPHY;  Surgeon: Lennette Bihari, MD;  Location: [facility] INVASIVE CV LAB;  Service: Cardiovascular;  Laterality: N/A;    SPERMATOCELECTOMY Right 11/13/2019   Procedure: EXCISION OF SPERMATIC CORD CYST;  Surgeon: Malen Gauze, MD;  Location: [facility];  Service: Urology;  Laterality: Right;   VASECTOMY  11/13/2019   Procedure: VASECTOMY;  Surgeon: Malen Gauze, MD;  Location: [facility];  Service: Urology;;   Patient Active Problem List   Diagnosis Date Noted   Chronic hoarseness 06/29/2023   Prediabetes 06/29/2023   AKI (acute kidney injury) (HCC) 02/03/2022   NICM (nonischemic cardiomyopathy) (HCC) 02/03/2022   Sinus node dysfunction (HCC)    Atrial flutter (HCC) 01/23/2022   OAB (overactive bladder) 12/27/2020   Nocturia 01/10/2020   Benign prostatic hyperplasia with urinary obstruction 01/10/2020   Non-recurrent unilateral inguinal hernia without obstruction or gangrene    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   History of adenomatous polyp of colon 02/27/2019   Paroxysmal atrial fibrillation (HCC) 07/18/2015   Bradycardia 06/29/2015   Near syncope    Atrial fibrillation with rapid ventricular response (HCC) 06/27/2015   Left ventricular diastolic dysfunction, NYHA class 3 10/26/2014   Kidney cysts 04/04/2013   HTN (hypertension) 03/31/2013    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 03/31/2013   Diastolic dysfunction- grade 2- echo 06/28/15 03/31/2013   RBBB 03/31/2013   AV block, 1st degree 03/31/2013   Sinus bradycardia 03/31/2013   Hematochezia 12/29/2011    Onset date: ~January  2023  REFERRING DIAG: R49.0 (ICD-10-CM) - 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 Pt accompanied to the appointment by his wife, Joseph [name], who assisted in providing background information. Pt has also been seen by Dr. Marjory Lies (neurology) in October 2024 and was diagnosed with aphasia, dysarthria, and memory loss. Pt and his wife report a "slurring" of his words, difficulty pronouncing words, hoarseness, and stuttering.   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 No  PATIENT GOALS: Improve speaking and voice   Procedure: Flexible fiberoptic laryngoscopy with stroboscopy ([id number])   Surgeon: Ashok [name], MDNasopharynx: No mass or lesion with intact mucosa Hypopharynx: Intact mucosa without pooling of secretions Larynx Glottic: Full true vocal cord mobility without lesion or mass, evidence of b/l VF atrophy and glottic insufficiency Supraglottic: Normal appearing epiglottis and AE folds Interarytenoid Space: Moderate pachydermia&edema Subglottic Space: Patent without lesion or edema Neck Neck and Trachea: Midline trachea without mass or lesion Thyroid: No mass or nodularity Lymphatics: No lymphadenopathy  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 No tumors or masses; vocal cords are clear and mobile. Initial management involves voice therapy to improve lung support and voice projection. If ineffective, procedural interventions to augment vocal cords are available. This is a quality of life issue, not life-threatening. Discussed controlling postnasal drainage with Flonase/antihistamine - Refer to voice therapy - Consider procedural interventions if voice therapy is ineffective   Chronic post-nasal drip and suspected environmental allergies - trial of Flonase 2 puffs b/l nares - continue Xyzal 5 g daily  PREVIOUS TREATMENT:  Pt accompanied to therapy by his wife. SLP reviewed the previous session and inquired  how they felt things were going at home regarding his speech/voice. His wife indicates that she does see "some improvement", however the Pt denies seeing improvement. His wife reports that practice at home is limited. SLP stated that the two things that will likely help the Pt most would be to reduce throat clearing and practice his voice exercises at home. Throat clearing was limited today and Pt was encouraged to produce a quick cough or take a sip of liquid instead. He continues to present with reduced breath support with maximum sustained phonation of 6 seconds (with /s/ also). SLP modeled resonant voice therapy techniques and Pt able to imitate and he does achieve slight improvement in quality (reduced tension and breathiness/hoarseness), however this is short acting and difficult for him to carryover into conversation. SLP modeled the "num, num, hello, yaya" and then produced sentences for Pt to imitate. He was encouraged to practice the same for HEP. Continue to target goals.   CURRENT TREATMENT: Pt attended SLP session solo today. SLP asked if he has been practicing his vocal strength and he stated, "not as often as I should". He continued to ask some of the same questions as in previous sessions and SLP continued to provide education in regards to his questions. SLP led Pt through the vocal strength building exercises with mi/mod  cues. He was able to increase sustained phonation of "ee" to 8.5 seconds with mod cues to "push through".  Vocal intensity increased to 78-80 dB, but for only sustained overall for 4 seconds. Pt then stated, "I don't understand why I'm doing this". SLP reiterated that it is to improve his vocal quality. SLP also recorded Pt and compared it to one of his earlier sessions and Pt stated that he could hear an improvement (decreased hoarseness). He continues to need encouragement and cues to complete vocal function exercises and does not complete often at home. Plan to see Pt next  session and likely d/c after eduction and streamlined HEP.  09/21/23   PATIENT EDUCATION: Education details: Provided Pt with written recommendations regarding reducing throat clearing and completing vocal endurance exercises at home  Person educated: Patient and Spouse Education method: Explanation Education comprehension: needs further education  HOME EXERCISE PROGRAM: Pt will completed HEP as assigned to facilitate carryover of treatment strategies and techniques in home and community environment with written cues.   GOALS: Goals reviewed with patient? Yes   SHORT TERM GOALS: Target date: 09/25/2023   Pt will Decrease chronic cough by 80% via reduction in laryngeal hypersensitivity. a. Sip liquid instead of coughing, dry swallow, or quick cough and swallow  b. Follow reflux recommendations (if applicable) c. Increase hydration to a minimum of 48+oz of water d. Improve breath support during speech (not speaking to end of breath)   Baseline: 24 oz water per day, throat clearing ~every 30 seconds Goal status: ONGOING   2.  The patient will use a strong, clear voice when communicating with family, friends and/or colleagues resulting in an 80% reduction in requests for repetition. Baseline: request for repetition 2x/ one minute Goal status: ONGOING   3.  Regularly practice voice building/strengthening exercises a minimum of 5 days/week for a 20+ minutes a day. Baseline: new today Goal status: ONGOING   4.  Patient will maintain a vocal loudness of 75 dB SPL or greater at a 30 cm microphone to mouth distance to communicate effectively during conversational speech while maintaining good quality with minimal cues.  Baseline: 54 dB Goal status: ONGOING   5.  Patient will maintain a maximum phonation duration of 10 seconds or greater with minimal cues during sustained phonation.  Baseline: 6 seconds Goal status: ONGOING   6.  Patient will maintain a vocal loudness of 75 dB or  greater during sustained vowel phonation on 80% of trials. Baseline: ~68 dB Goal status: ONGOING   LONG TERM GOALS: Target date: 09/25/2023   Pt will Increase vocal endurance to level needed to communicate with family and friends without vocal fatigue or discomfort. Baseline: requests for repetition and Pt reports poor breath support Goal status: ONGOING       ASSESSMENT:   CLINICAL IMPRESSION: Patient is a 78 y.o. male who was seen today for a voice evaluation due to VF atrophy and glottic insufficiency after referral from Dr. Irene Pap. Pt presents with mild/moderate hoarse vocal quality with breathy quality, reduced vocal intensity, reduced breath support, moderate cognitive deficits, and mild expressive language deficits. Pt and his wife would like to address his dysphonia, dysarthria, and expressive language deficits so that he can communicate more independently with family and medical professionals. Pt states that he is a "quiet person", but is bothered by his hesitations in speech and frequent throat clearing. Pt's average conversational speech volume is ~54 dB, sustained phonation of 6 seconds, clears his throat nearly every 30  seconds, and drinks approximately 24 ounces of water a day. Recommend SLP therapy to address breath support, vocal loudness, and fluency enhancing strategies.     OBJECTIVE IMPAIRMENTS: include attention, memory, executive functioning, expressive language, dysarthria, and voice disorder. These impairments are limiting patient from managing medications and effectively communicating at home and in community. Factors affecting potential to achieve goals and functional outcome are ability to learn/carryover information, previous level of function, and time post dysphonia onset . Patient will benefit from skilled SLP services to address above impairments and improve overall function.   REHAB POTENTIAL: Fair long standing dysphonia and slow changes in cognitive  communication abilities   PLAN:   SLP FREQUENCY: 1-2x/week   SLP DURATION: 4 weeks   PLANNED INTERVENTIONS: Cueing hierachy, Internal/external aids, Functional tasks, Multimodal communication approach, SLP instruction and feedback, Compensatory strategies, Patient/family education, and [id number] Treatment of speech (30 or 45 min)    Thank you,  Havery Moros, CCC-SLP [phone]  Joseph [name], CCC-SLP 09/21/2023, 3:25 PM

## 2023-09-23 ENCOUNTER — Encounter (HOSPITAL_COMMUNITY): Payer: Self-pay | Admitting: Speech Pathology

## 2023-09-23 ENCOUNTER — Ambulatory Visit (HOSPITAL_COMMUNITY): Payer: Medicare PPO | Admitting: Speech Pathology

## 2023-09-23 DIAGNOSIS — R490 Dysphonia: Secondary | ICD-10-CM | POA: Diagnosis not present

## 2023-09-23 DIAGNOSIS — R41841 Cognitive communication deficit: Secondary | ICD-10-CM

## 2023-09-23 DIAGNOSIS — H401131 Primary open-angle glaucoma, bilateral, mild stage: Secondary | ICD-10-CM | POA: Diagnosis not present

## 2023-09-23 NOTE — Therapy (Signed)
 OUTPATIENT SPEECH LANGUAGE PATHOLOGY VOICE TREATMENT   Patient Name: Joseph [name] MRN: [id number] DOB:March 19, 1946, 78 y.o., male Today's Date: 09/23/2023  PCP: Rica Records, FNP REFERRING PROVIDER: Ashok Croon, MD  END OF SESSION:  End of Session - 09/23/23 1130     Visit Number 8    Number of Visits 9    Date for SLP Re-Evaluation 09/23/23    Authorization Type [employer]    SLP Start Time 1108    SLP Stop Time  1153    SLP Time Calculation (min) 45 min    Activity Tolerance Patient tolerated treatment well            Past Medical History:  Diagnosis Date   1st degree AV block    Atrial fibrillation (HCC)    Diastolic dysfunction 4/13   grade 1   Hypertension    New onset atrial fibrillation (HCC) 06/2015   RBBB    Sinus bradycardia    Stroke (HCC)    no defecits, pt denies having a stroke, says Dr Tresa Endo diagnosed this   Past Surgical History:  Procedure Laterality Date   BIV PACEMAKER INSERTION CRT-P N/A 01/26/2022   Procedure: BIV PACEMAKER INSERTION CRT-P;  Surgeon: Marinus Maw, MD;  Location: [facility] INVASIVE CV LAB;  Service: Cardiovascular;  Laterality: N/A;   COLONOSCOPY     COLONOSCOPY  12/30/2011   Procedure: COLONOSCOPY;  Surgeon: Corbin Ade, MD;  [facility];  Service: Endoscopy;  Laterality: N/A;  1:45PM   COLONOSCOPY N/A 04/19/2019   Procedure: COLONOSCOPY;  Surgeon: Corbin Ade, MD;  [facility]: [facility];  Service: Endoscopy;  Laterality: N/A;  2:00pm   EXCISION MASS LOWER EXTREMETIES Left 05/12/2018   Procedure: Left foot plantar fibroma excision;  Surgeon: Toni Arthurs, MD;  Location: [facility];  Service: Orthopedics;  Laterality: Left;   FOOT SURGERY Left    LEFT HEART CATH AND CORONARY ANGIOGRAPHY N/A 01/26/2022   Procedure: LEFT HEART CATH AND CORONARY ANGIOGRAPHY;  Surgeon: Lennette Bihari, MD;  Location: [facility] INVASIVE CV LAB;  Service: Cardiovascular;  Laterality: N/A;    SPERMATOCELECTOMY Right 11/13/2019   Procedure: EXCISION OF SPERMATIC CORD CYST;  Surgeon: Malen Gauze, MD;  Location: [facility];  Service: Urology;  Laterality: Right;   VASECTOMY  11/13/2019   Procedure: VASECTOMY;  Surgeon: Malen Gauze, MD;  Location: [facility];  Service: Urology;;   Patient Active Problem List   Diagnosis Date Noted   Chronic hoarseness 06/29/2023   Prediabetes 06/29/2023   AKI (acute kidney injury) (HCC) 02/03/2022   NICM (nonischemic cardiomyopathy) (HCC) 02/03/2022   Sinus node dysfunction (HCC)    Atrial flutter (HCC) 01/23/2022   OAB (overactive bladder) 12/27/2020   Nocturia 01/10/2020   Benign prostatic hyperplasia with urinary obstruction 01/10/2020   Non-recurrent unilateral inguinal hernia without obstruction or gangrene    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   History of adenomatous polyp of colon 02/27/2019   Paroxysmal atrial fibrillation (HCC) 07/18/2015   Bradycardia 06/29/2015   Near syncope    Atrial fibrillation with rapid ventricular response (HCC) 06/27/2015   Left ventricular diastolic dysfunction, NYHA class 3 10/26/2014   Kidney cysts 04/04/2013   HTN (hypertension) 03/31/2013    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 03/31/2013   Diastolic dysfunction- grade 2- echo 06/28/15 03/31/2013   RBBB 03/31/2013   AV block, 1st degree 03/31/2013   Sinus bradycardia 03/31/2013   Hematochezia 12/29/2011    Onset date: ~January  2023  REFERRING DIAG: R49.0 (ICD-10-CM) - 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 Ashok Croon (ENT) for voice therapy due to two-year history of progressively worsening  voice changes. Pt accompanied to the appointment by his wife, Darel Hong, who assisted in providing background information. Pt has also been seen by Dr. Marjory Lies (neurology) in October 2024 and was diagnosed with aphasia, dysarthria, and memory loss. Pt and his wife report a "slurring" of his words, difficulty pronouncing words, hoarseness, and stuttering.   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 No  PATIENT GOALS: Improve speaking and voice   Procedure: Flexible fiberoptic laryngoscopy with stroboscopy ([id number])   Surgeon: Ashok Croon, MDNasopharynx: No mass or lesion with intact mucosa Hypopharynx: Intact mucosa without pooling of secretions Larynx Glottic: Full true vocal cord mobility without lesion or mass, evidence of b/l VF atrophy and glottic insufficiency Supraglottic: Normal appearing epiglottis and AE folds Interarytenoid Space: Moderate pachydermia&edema Subglottic Space: Patent without lesion or edema Neck Neck and Trachea: Midline trachea without mass or lesion Thyroid: No mass or nodularity Lymphatics: No lymphadenopathy  Strobe today 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 No tumors or masses; vocal cords are clear and mobile. Initial management involves voice therapy to improve lung support and voice projection. If ineffective, procedural interventions to augment vocal cords are available. This is a quality of life issue, not life-threatening. Discussed controlling postnasal drainage with Flonase/antihistamine - Refer to voice therapy - Consider procedural interventions if voice therapy is ineffective   Chronic post-nasal drip and suspected environmental allergies - trial of Flonase 2 puffs b/l nares - continue Xyzal 5 g daily  PREVIOUS TREATMENT: Pt attended SLP session solo today. SLP asked if he has been practicing his vocal strength and he stated, "not  as often as I should". He continued to ask some of the same questions as in previous sessions and SLP continued to provide education in regards to his questions. SLP led Pt through the vocal strength building exercises with mi/mod cues. He was able to increase sustained phonation of "ee" to 8.5 seconds with mod cues to "push through".  Vocal intensity increased to 78-80 dB, but for only sustained overall for 4 seconds. Pt then stated, "I don't understand why I'm doing this". SLP reiterated that it is to improve his vocal quality. SLP also recorded Pt and compared it to one of his earlier sessions and Pt stated that he could hear an improvement (decreased hoarseness). He continues to need encouragement and cues to complete vocal function exercises and does not complete often at home. Plan to see Pt next session and likely d/c after eduction and streamlined HEP.   CURRENT TREATMENT: Pt attended SLP session with his wife today. SLP reviewed previous session and information regarding variable progress toward goals due to short term memory deficits. SLP streamlined his vocal function exercises to four steps in order to try and increase his participation with completion at home. He was instructed to complete: sustained "eee" for 8+ seconds x5 due to improved Pt success with completion over /a/, glide up and down with /a/, and oral  reading task with several options provided. Pt was able to return demonstrate completion with min cues. Pt then participated in question/answer session with SLP while implementing vocal intensity strategies. No requests for repetition during this task. Pt has made variable progress over the last month of voice therapy sessions due to short term memory impairment. Vocal hoarseness has improved in quality and Pt responds well to "send your voice up and over". He still reports that he thinks he is yelling at times, however, acknowledges that it doesn't sound like he's yelling when he hears a  recording. Pt will be discharged from SLP services at this time and was encouraged to complete vocal warm up and vocal intensity exercises going forward. Pt and spouse are in agreement with d/c at this time.    09/23/23   PATIENT EDUCATION: Education details: Provided Pt with written recommendations regarding reducing throat clearing and completing vocal endurance exercises at home  Person educated: Patient and Spouse Education method: Explanation Education comprehension: needs further education  HOME EXERCISE PROGRAM: Pt will completed HEP as assigned to facilitate carryover of treatment strategies and techniques in home and community environment with written cues.   GOALS: Goals reviewed with patient? Yes   SHORT TERM GOALS: Target date: 09/25/2023   Pt will Decrease chronic cough by 80% via reduction in laryngeal hypersensitivity. a. Sip liquid instead of coughing, dry swallow, or quick cough and swallow  b. Follow reflux recommendations (if applicable) c. Increase hydration to a minimum of 48+oz of water d. Improve breath support during speech (not speaking to end of breath)   Baseline: 24 oz water per day, throat clearing ~every 30 seconds Goal status:Partially met, not really using Flonase, did increase water intake, throat clearing has diminished to ~5x in 30 minutes   2.  The patient will use a strong, clear voice when communicating with family, friends and/or colleagues resulting in an 80% reduction in requests for repetition. Baseline: request for repetition 2x/ one minute Goal status:Met   3.  Regularly practice voice building/strengthening exercises a minimum of 5 days/week for a 20+ minutes a day. Baseline: new today Goal status: Not Met; partially met  4.  Patient will maintain a vocal loudness of 75 dB SPL or greater at a 30 cm microphone to mouth distance to communicate effectively during conversational speech while maintaining good quality with minimal cues.   Baseline: 54 dB Goal status: Partially Met (~72 dB conversation)   5.  Patient will maintain a maximum phonation duration of 10 seconds or greater with minimal cues during sustained phonation.  Baseline: 6 seconds Goal status: Partially Met at 8.5 seconds   6.  Patient will maintain a vocal loudness of 75 dB or greater during sustained vowel phonation on 80% of trials. Baseline: ~68 dB Goal status: Met   LONG TERM GOALS: Target date: 09/25/2023   Pt will Increase vocal endurance to level needed to communicate with family and friends without vocal fatigue or discomfort. Baseline: requests for repetition and Pt reports poor breath support Goal status: Met       ASSESSMENT:   CLINICAL IMPRESSION: Patient is a 78 y.o. male who was seen today for a voice evaluation due to VF atrophy and glottic insufficiency after referral from Dr. Irene Pap. Pt presents with mild/moderate hoarse vocal quality with breathy quality, reduced vocal intensity, reduced breath support, moderate cognitive deficits, and mild expressive language deficits. Pt and his wife would like to address his dysphonia, dysarthria, and expressive language deficits so that  he can communicate more independently with family and medical professionals. Pt states that he is a "quiet person", but is bothered by his hesitations in speech and frequent throat clearing. Pt's average conversational speech volume is ~54 dB, sustained phonation of 6 seconds, clears his throat nearly every 30 seconds, and drinks approximately 24 ounces of water a day. Recommend SLP therapy to address breath support, vocal loudness, and fluency enhancing strategies.     OBJECTIVE IMPAIRMENTS: include attention, memory, executive functioning, expressive language, dysarthria, and voice disorder. These impairments are limiting patient from managing medications and effectively communicating at home and in community. Factors affecting potential to achieve goals and  functional outcome are ability to learn/carryover information, previous level of function, and time post dysphonia onset . Patient will benefit from skilled SLP services to address above impairments and improve overall function.   REHAB POTENTIAL: Fair long standing dysphonia and slow changes in cognitive communication abilities   PLAN: D/C    SPEECH THERAPY DISCHARGE SUMMARY  Visits from Start of Care: 8  Current functional level related to goals / functional outcomes: Pt met or partially met goals, see above   Remaining deficits: Variable hoarseness   Education / Equipment: HEP provided   Patient agrees to discharge. Patient goals were partially met. Patient is being discharged due to maximized rehab potential.      Thank you,  Havery Moros, CCC-SLP [phone]  Osman Calzadilla, CCC-SLP 09/23/2023, 1:00 PM

## 2023-10-04 ENCOUNTER — Ambulatory Visit: Payer: Medicare PPO | Admitting: Urology

## 2023-10-04 VITALS — BP 171/90 | HR 60

## 2023-10-04 DIAGNOSIS — N281 Cyst of kidney, acquired: Secondary | ICD-10-CM | POA: Diagnosis not present

## 2023-10-04 DIAGNOSIS — N138 Other obstructive and reflux uropathy: Secondary | ICD-10-CM

## 2023-10-04 DIAGNOSIS — N3281 Overactive bladder: Secondary | ICD-10-CM

## 2023-10-04 DIAGNOSIS — N401 Enlarged prostate with lower urinary tract symptoms: Secondary | ICD-10-CM

## 2023-10-04 DIAGNOSIS — R351 Nocturia: Secondary | ICD-10-CM

## 2023-10-04 MED ORDER — ALFUZOSIN HCL ER 10 MG PO TB24
10.0000 mg | ORAL_TABLET | Freq: Every day | ORAL | 3 refills | Status: DC
Start: 2023-10-04 — End: 2024-04-03

## 2023-10-04 MED ORDER — MIRABEGRON ER 50 MG PO TB24: 50.0000 mg | ORAL_TABLET | Freq: Every day | ORAL | 3 refills | Status: DC

## 2023-10-04 MED ORDER — SOLIFENACIN SUCCINATE 5 MG PO TABS
5.0000 mg | ORAL_TABLET | Freq: Every day | ORAL | 3 refills | Status: DC
Start: 2023-10-04 — End: 2024-04-03

## 2023-10-04 NOTE — Progress Notes (Unsigned)
 10/04/2023 9:08 AM   Joseph [name] [birth year] [id number]  Referring provider: Rica Records, FNP [phone] S[address] [place],  [place] [address]  Followup BPh and OAB   HPI: Joseph [name] is a 77yo here for followup for BPH, OAB, and renal cysts  He underwent renal US which showed bilateral 5-6cm renal cysts. He has daytime incontinence daily. He is occasionally taking vesicare and uroxatral 10mg . Urine stream is fair. He denies straining to urinate. No other complaints today   PMH: Past Medical History:  Diagnosis Date   1st degree AV block    Atrial fibrillation (HCC)    Diastolic dysfunction 4/13   grade 1   Hypertension    New onset atrial fibrillation (HCC) 06/2015   RBBB    Sinus bradycardia    Stroke (HCC)    no defecits, pt denies having a stroke, says Dr Tresa Endo diagnosed this    Surgical History: Past Surgical History:  Procedure Laterality Date   BIV PACEMAKER INSERTION CRT-P N/A 01/26/2022   Procedure: BIV PACEMAKER INSERTION CRT-P;  Surgeon: Marinus Maw, MD;  Location: [facility] INVASIVE CV LAB;  Service: Cardiovascular;  Laterality: N/A;   COLONOSCOPY     COLONOSCOPY  12/30/2011   Procedure: COLONOSCOPY;  Surgeon: Corbin Ade, MD;  [facility];  Service: Endoscopy;  Laterality: N/A;  1:45PM   COLONOSCOPY N/A 04/19/2019   Procedure: COLONOSCOPY;  Surgeon: Corbin Ade, MD;  [facility];  Service: Endoscopy;  Laterality: N/A;  2:00pm   EXCISION MASS LOWER EXTREMETIES Left 05/12/2018   Procedure: Left foot plantar fibroma excision;  Surgeon: Toni Arthurs, MD;  Location: [facility];  Service: Orthopedics;  Laterality: Left;   FOOT SURGERY Left    LEFT HEART CATH AND CORONARY ANGIOGRAPHY N/A 01/26/2022   Procedure: LEFT HEART CATH AND CORONARY ANGIOGRAPHY;  Surgeon: Lennette Bihari, MD;  Location: [facility] INVASIVE CV LAB;  Service: Cardiovascular;  Laterality: N/A;   SPERMATOCELECTOMY Right 11/13/2019   Procedure:  EXCISION OF SPERMATIC CORD CYST;  Surgeon: Malen Gauze, MD;  Location: [facility];  Service: Urology;  Laterality: Right;   VASECTOMY  11/13/2019   Procedure: VASECTOMY;  Surgeon: Malen Gauze, MD;  Location: [facility];  Service: Urology;;    Home Medications:  Allergies as of 10/04/2023   No Known Allergies      Medication List        Accurate as of October 04, 2023  9:08 AM. If you have any questions, ask your nurse or doctor.          alfuzosin 10 MG 24 hr tablet Commonly known as: UROXATRAL Take 1 tablet (10 mg total) by mouth at bedtime.   apixaban 5 MG Tabs tablet Commonly known as: Eliquis Take 1 tablet (5 mg total) by mouth 2 (two) times daily.   carvedilol 12.5 MG tablet Commonly known as: COREG TAKE 1 TABLET(12.5 MG) BY MOUTH TWICE DAILY   cholecalciferol 25 MCG (1000 UNIT) tablet Commonly known as: VITAMIN D3 Take 1 tablet (1,000 Units total) by mouth daily.   cloNIDine 0.1 MG tablet Commonly known as: Catapres Take 1 tablet (0.1 mg total) by mouth 2 (two) times daily.   donepezil 10 MG tablet Commonly known as: ARICEPT Take 10 mg by mouth daily.   doxazosin 4 MG tablet Commonly known as: CARDURA Take 4 mg by mouth at bedtime.   Entresto 24-26 MG Generic drug: sacubitril-valsartan TAKE 1 TABLET BY  MOUTH TWICE DAILY   fluticasone 50 MCG/ACT nasal spray Commonly known as: FLONASE Place 2 sprays into both nostrils daily.   hydrochlorothiazide 25 MG tablet Commonly known as: HYDRODIURIL Take 1 tablet (25 mg total) by mouth daily.   latanoprost 0.005 % ophthalmic solution Commonly known as: XALATAN Place 1 drop into both eyes at bedtime.   levocetirizine 5 MG tablet Commonly known as: XYZAL Take 1 tablet (5 mg total) by mouth every evening.   Lubricant Eye Drops 0.4-0.3 % Soln Generic drug: Polyethyl Glycol-Propyl Glycol Place 1 drop into both eyes 3 (three) times daily as needed (dry/irritated eyes.).   mirabegron ER 50 MG Tb24  tablet Commonly known as: Myrbetriq Take 1 tablet (50 mg total) by mouth daily.   multivitamin with minerals Tabs tablet Take 1 tablet by mouth daily.   PROSTATE HEALTH PO Take 1 capsule by mouth daily. Super Beta Prostate   rosuvastatin 5 MG tablet Commonly known as: Crestor Take 1 tablet (5 mg total) by mouth daily.   solifenacin 5 MG tablet Commonly known as: VESICARE Take 1 tablet (5 mg total) by mouth at bedtime.   spironolactone 25 MG tablet Commonly known as: ALDACTONE TAKE 1/2 TABLET BY MOUTH EVERY DAY        Allergies: No Known Allergies  Family History: Family History  Problem Relation Age of Onset   Cancer Brother    CVA Mother    Hypertension Father    Colon cancer Neg Hx     Social History:  reports that he quit smoking about 29 years ago. His smoking use included cigarettes. He has never used smokeless tobacco. He reports that he does not currently use alcohol. He reports that he does not use drugs.  ROS: All other review of systems were reviewed and are negative except what is noted above in HPI  Physical Exam: BP (!) 171/90   Pulse 60   Constitutional:  Alert and oriented, No acute distress. HEENT: [place] AT, moist mucus membranes.  Trachea midline, no masses. Cardiovascular: No clubbing, cyanosis, or edema. Respiratory: Normal respiratory effort, no increased work of breathing. GI: Abdomen is soft, nontender, nondistended, no abdominal masses GU: No CVA tenderness.  Lymph: No cervical or inguinal lymphadenopathy. Skin: No rashes, bruises or suspicious lesions. Neurologic: Grossly intact, no focal deficits, moving all 4 extremities. Psychiatric: Normal mood and affect.  Laboratory Data: Lab Results  Component Value Date   WBC 4.5 04/22/2023   HGB 11.7 (L) 04/22/2023   HCT 38.6 (L) 04/22/2023   MCV 71.3 (L) 04/22/2023   PLT 223 04/22/2023    Lab Results  Component Value Date   CREATININE 1.39 (H) 04/22/2023    No results found for:  "PSA"  No results found for: "TESTOSTERONE"  Lab Results  Component Value Date   HGBA1C 6.2 (H) 06/29/2023    Urinalysis    Component Value Date/Time   COLORURINE YELLOW 04/22/2023 1732   APPEARANCEUR CLEAR 04/22/2023 1732   APPEARANCEUR Clear 04/06/2022 0956   LABSPEC 1.017 04/22/2023 1732   PHURINE 5.0 04/22/2023 1732   GLUCOSEU NEGATIVE 04/22/2023 1732   HGBUR NEGATIVE 04/22/2023 1732   BILIRUBINUR NEGATIVE 04/22/2023 1732   BILIRUBINUR Negative 04/06/2022 0956   KETONESUR NEGATIVE 04/22/2023 1732   PROTEINUR NEGATIVE 04/22/2023 1732   NITRITE NEGATIVE 04/22/2023 1732   LEUKOCYTESUR NEGATIVE 04/22/2023 1732    Lab Results  Component Value Date   LABMICR 20.1 02/25/2023   WBCUA None seen 09/23/2021   LABEPIT None seen 09/23/2021  BACTERIA None seen 09/23/2021    Pertinent Imaging: Renal US 05/26/2023: Images reviewed and discussed with the patient  No results found for this or any previous visit.  No results found for this or any previous visit.  No results found for this or any previous visit.  No results found for this or any previous visit.  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  COMPARISON: U/S renal 04/08/2023, CTA abdomen/pelvis 01/03/2021, Joseph abdomen 08/16/2012.  TECHNIQUE: Two-dimensional grayscale and color Doppler ultrasound of the kidneys and bladder was performed.  FINDINGS: The bladder demonstrates a normal anechoic echogenicity without wall thickening. Prevoid bladder volume is 178 mL. The urinary bladder is empty postvoid. The left ureteral jet is faintly visualized, right is not seen.  The right kidney measures 10.9 cm in length. Renal cortical echotexture is mildly increased. There is no hydronephrosis. There are no stones. There is a 3.6 cm anechoic cyst within the upper pole. A 3.5 x 2.3 x 2.4 cm cyst with a  single septation versus two adjacent cysts is visualized within the inferior pole.  The left kidney measures 11.4 cm in length. Renal cortical echotexture is mildly increased. There is no hydronephrosis. There are no stones. There are multiple cysts, largest measuring 6.5 cm. A 5.0 x 2.6 x 3.6 cyst with a single septation versus two adjacent cysts is visualized within the superior pole.  IMPRESSION: 1. Increased echogenicity of the bilateral renal cortices and multiple cysts, consistent with medical renal disease.  2. Cyst with a single septation versus two adjacent cysts visualized within the right inferior and left superior poles. Multiphase CT of the abdomen is recommended further evaluation.  3. Faint visualization of the left ureteral jet. Right ureteral jet is not visualized.  Thank you for allowing [place] to assist in the care of this patient.   Electronically Signed By: Lestine Box M.D. On: 06/07/2023 12:25  No results found for this or any previous visit.  No results found for this or any previous visit.  No results found for this or any previous visit.   Assessment & Plan:    1. Renal cyst (Primary) No further workup needed  2. Benign prostatic hyperplasia with urinary obstruction Uroxatral 10mg  daily  3. OAB (overactive bladder) Mirabegron 50mg  and vesicare 5mg  daily  4. Nocturia Uroxatral 10mg  daily   No follow-ups on file.  Wilkie Aye, MD  [facility] [place]

## 2023-10-08 ENCOUNTER — Encounter: Payer: Self-pay | Admitting: Urology

## 2023-10-08 NOTE — Patient Instructions (Signed)

## 2023-10-26 ENCOUNTER — Ambulatory Visit: Payer: Medicare PPO

## 2023-10-26 DIAGNOSIS — I428 Other cardiomyopathies: Secondary | ICD-10-CM | POA: Diagnosis not present

## 2023-10-27 LAB — CUP PACEART REMOTE DEVICE CHECK
Battery Remaining Longevity: 64 mo
Battery Remaining Percentage: 75 %
Battery Voltage: 2.99 V
Brady Statistic AP VP Percent: 70 %
Brady Statistic AP VS Percent: 1 %
Brady Statistic AS VP Percent: 29 %
Brady Statistic AS VS Percent: 1 %
Brady Statistic RA Percent Paced: 69 %
Date Time Interrogation Session: 20250415034705
Implantable Lead Connection Status: 753985
Implantable Lead Connection Status: 753985
Implantable Lead Connection Status: 753985
Implantable Lead Implant Date: 20230717
Implantable Lead Implant Date: 20230717
Implantable Lead Implant Date: 20230717
Implantable Lead Location: 753858
Implantable Lead Location: 753859
Implantable Lead Location: 753860
Implantable Pulse Generator Implant Date: 20230717
Lead Channel Impedance Value: 410 Ohm
Lead Channel Impedance Value: 490 Ohm
Lead Channel Impedance Value: 560 Ohm
Lead Channel Pacing Threshold Amplitude: 0.75 V
Lead Channel Pacing Threshold Amplitude: 0.75 V
Lead Channel Pacing Threshold Amplitude: 1 V
Lead Channel Pacing Threshold Pulse Width: 0.5 ms
Lead Channel Pacing Threshold Pulse Width: 0.5 ms
Lead Channel Pacing Threshold Pulse Width: 0.5 ms
Lead Channel Sensing Intrinsic Amplitude: 12 mV
Lead Channel Sensing Intrinsic Amplitude: 4.2 mV
Lead Channel Setting Pacing Amplitude: 2 V
Lead Channel Setting Pacing Amplitude: 2 V
Lead Channel Setting Pacing Amplitude: 2.5 V
Lead Channel Setting Pacing Pulse Width: 0.5 ms
Lead Channel Setting Pacing Pulse Width: 0.5 ms
Lead Channel Setting Sensing Sensitivity: 2 mV
Pulse Gen Model: 3562
Pulse Gen Serial Number: 8094736

## 2023-10-28 ENCOUNTER — Ambulatory Visit (INDEPENDENT_AMBULATORY_CARE_PROVIDER_SITE_OTHER): Payer: Medicare PPO | Admitting: Family Medicine

## 2023-10-28 ENCOUNTER — Encounter: Payer: Self-pay | Admitting: Family Medicine

## 2023-10-28 VITALS — BP 140/82 | HR 60 | Ht 70.0 in | Wt 220.1 lb

## 2023-10-28 DIAGNOSIS — H04123 Dry eye syndrome of bilateral lacrimal glands: Secondary | ICD-10-CM

## 2023-10-28 MED ORDER — CYCLOSPORINE 0.05 % OP EMUL: 1.0000 [drp] | Freq: Two times a day (BID) | OPHTHALMIC | 2 refills | Status: AC

## 2023-10-28 MED ORDER — VITAMIN D3 25 MCG (1000 UNIT) PO TABS: 1000.0000 [IU] | ORAL_TABLET | Freq: Every day | ORAL | 1 refills | Status: AC

## 2023-10-28 MED ORDER — CLONIDINE HCL 0.1 MG PO TABS: 0.1000 mg | ORAL_TABLET | Freq: Two times a day (BID) | ORAL | 0 refills | Status: DC

## 2023-10-28 MED ORDER — HYDROCHLOROTHIAZIDE 25 MG PO TABS: 25.0000 mg | ORAL_TABLET | Freq: Every day | ORAL | 0 refills | Status: DC

## 2023-10-28 NOTE — Progress Notes (Signed)
 Established Patient Office Visit   Subjective  Patient ID: Joseph [name], male    DOB: [birth year]  Age: 78 y.o. MRN: [id number]  Chief Complaint  Patient presents with   Medical Management of Chronic Issues    4 month chronic f/u  Dry eyes     He  has a past medical history of 1st degree AV block, Atrial fibrillation (HCC), Diastolic dysfunction (4/13), Hypertension, New onset atrial fibrillation (HCC) (06/2015), RBBB, Sinus bradycardia, and Stroke (HCC).  Eye Problem - Chronic Dry Eye The patient reports  bilateral dry eye involvement with persistent symptoms of chronic dry eye. Symptoms have been ongoing and fluctuate in intensity over time. There is no history of trauma or identifiable injury mechanism. The patient rates pain as 0/10, denying any ocular pain. There is no known exposure to conjunctivitis, and the patient does not wear contact lenses. Pertinent negatives include no eye discharge, diplopia, redness, or pruritus. Associated symptoms include a sensation of dryness and irritation described as a "sandpaper" feeling. The patient has previously used over-the-counter lubricating eye drops, which provided only mild, temporary relief.      Review of Systems  Eyes:  Negative for double vision, discharge and redness.  Respiratory:  Negative for shortness of breath.   Cardiovascular:  Negative for chest pain.  Neurological:  Negative for dizziness and headaches.      Objective:     BP (!) 140/82   Pulse 60   Ht 5\' 10"  (1.778 m)   Wt 220 lb 1.3 oz (99.8 kg)   SpO2 99%   BMI 31.58 kg/m  BP Readings from Last 3 Encounters:  10/28/23 (!) 140/82  10/04/23 (!) 171/90  08/16/23 (!) 170/91      Physical Exam Vitals reviewed.  Constitutional:      General: He is not in acute distress.    Appearance: Normal appearance. He is not ill-appearing, toxic-appearing or diaphoretic.  HENT:     Head: Normocephalic.  Eyes:     General:        Right eye: No discharge.         Left eye: No discharge.     Conjunctiva/sclera: Conjunctivae normal.     Pupils: Pupils are equal, round, and reactive to light.  Cardiovascular:     Rate and Rhythm: Normal rate.     Pulses: Normal pulses.     Heart sounds: Normal heart sounds.  Pulmonary:     Effort: Pulmonary effort is normal. No respiratory distress.     Breath sounds: Normal breath sounds.  Abdominal:     General: Bowel sounds are normal.     Tenderness: There is no abdominal tenderness. There is no right CVA tenderness, left CVA tenderness or guarding.  Skin:    General: Skin is warm and dry.     Capillary Refill: Capillary refill takes less than 2 seconds.  Neurological:     Mental Status: He is alert.  Psychiatric:        Mood and Affect: Mood normal.        Behavior: Behavior normal.      No results found for any visits on 10/28/23.  The ASCVD Risk score (Arnett DK, et al., 2019) failed to calculate for the following reasons:   Risk score cannot be calculated because patient has a medical history suggesting prior/existing ASCVD    Assessment & Plan:  Chronic dryness of both eyes Assessment & Plan: Start Restasis eye drops  two times daily Advise on  take breaks during screen time, and use a humidifier to maintain moisture in your environment. Wearing wraparound glasses outdoors can also help reduce tear evaporation.  Orders: -     cycloSPORINE; Place 1 drop into both eyes 2 (two) times daily.  Dispense: 60 each; Refill: 2  Other orders -     Vitamin D3; Take 1 tablet (1,000 Units total) by mouth daily.  Dispense: 90 tablet; Refill: 1 -     cloNIDine HCl; Take 1 tablet (0.1 mg total) by mouth 2 (two) times daily.  Dispense: 180 tablet; Refill: 0 -     hydroCHLOROthiazide; Take 1 tablet (25 mg total) by mouth daily.  Dispense: 90 tablet; Refill: 0    Return in about 3 months (around 01/27/2024), or if symptoms worsen or fail to improve, for pre-diabetes, ckd.   Avelino Lek Amber Bail, FNP

## 2023-10-28 NOTE — Patient Instructions (Addendum)
        Great to see you today.  I have refilled the medication(s) we provide.    Key Info About Restasis: Mechanism: It works by reducing inflammation in the lacrimal (tear) glands and helping increase your natural tear production.  Dosing: One drop in each eye twice daily (about 12 hours apart).  Onset of Action: It may take 4 to 6 weeks to begin seeing improvement, with full benefits often seen by 3 to 6 months.  Side Effects (most common):  Burning/stinging when applying drops  Temporary blurred vision  Eye redness      If labs were collected, we will inform you of lab results once received either by echart message or telephone call.   - echart message- for normal results that have been seen by the patient already.   - telephone call: abnormal results or if patient has not viewed results in their echart.   - Please take medications as prescribed. - Follow up with your primary health provider if any health concerns arises. - If symptoms worsen please contact your primary care provider and/or visit the emergency department.

## 2023-10-28 NOTE — Assessment & Plan Note (Addendum)
 Start Restasis eye drops  two times daily Advise on take breaks during screen time, and use a humidifier to maintain moisture in your environment. Wearing wraparound glasses outdoors can also help reduce tear evaporation.

## 2023-11-02 ENCOUNTER — Other Ambulatory Visit: Payer: Self-pay | Admitting: Family Medicine

## 2023-11-12 ENCOUNTER — Ambulatory Visit (INDEPENDENT_AMBULATORY_CARE_PROVIDER_SITE_OTHER): Payer: Medicare PPO | Admitting: Otolaryngology

## 2023-11-12 ENCOUNTER — Encounter (INDEPENDENT_AMBULATORY_CARE_PROVIDER_SITE_OTHER): Payer: Self-pay | Admitting: Otolaryngology

## 2023-11-12 VITALS — BP 185/83 | HR 63

## 2023-11-12 DIAGNOSIS — R490 Dysphonia: Secondary | ICD-10-CM

## 2023-11-12 DIAGNOSIS — J383 Other diseases of vocal cords: Secondary | ICD-10-CM

## 2023-11-12 DIAGNOSIS — R0982 Postnasal drip: Secondary | ICD-10-CM

## 2023-11-12 DIAGNOSIS — J3089 Other allergic rhinitis: Secondary | ICD-10-CM

## 2023-11-12 NOTE — Patient Instructions (Signed)

## 2023-11-12 NOTE — Progress Notes (Signed)
 ENT Progress Note:   Update 11/12/2023  Discussed Joseph use of AI scribe software for clinical note transcription with Joseph patient, who gave verbal consent to proceed.  History of Present Illness Joseph [name] is a 78 year old [name] who presents with persistent voice issues despite speech therapy, persistent dysphonia. Hx of dementia Joseph his progress with therapy was c/b memory issues.   He has persistent voice issues with no improvement following speech therapy. He continues to experience phlegm in his throat. He has been using Flonase  nasal spray, though Joseph consistently every day.  He is currently taking Eliquis  2/2 hx of stroke. There have been no new hospital admissions or significant changes in his condition since Joseph last visit.  A brain MRI was previously planned, Joseph there have been no updates on this. He continues to see a neurologist.  No issues with swallowing Joseph he is able to eat a regular diet.    Records Reviewed:  Initial Evaluation  Reason for Consult: dysphonia x 2 yrs    HPI: Discussed Joseph use of AI scribe software for clinical note transcription with Joseph patient, who gave verbal consent to proceed.  History of Present Illness   Joseph patient is a 47 yoM with hx of dementia, recent visit to ED Joseph neurology for aphasia, slurred speech, here for presents with voice changes. He is accompanied by his spouse, who assists with providing history due to his dementia.   He has been experiencing voice changes for approximately one Joseph a half to two years, with symptoms worsening over time. There is no associated shortness of breath, difficulty swallowing, or pain with swallowing. He frequently clears his throat. He reports raspy Joseph weak/breathy voice overall with poor projection.   He has a history of smoking Joseph quit approximately thirty years ago.  He is currently on Eliquis  for atrial fibrillation. Despite his dementia, he remains relatively independent in his daily  activities.      Records Reviewed:  ED note 04/22/23  Joseph [name] is a 78 y.o. [name].  He is here for evaluation of slurred speech Joseph word finding difficulties that have been going on for a week Joseph a half for 2 weeks.  Is happening every day.  Wife also thinks that Metabet a little facial droop although she cannot remember which side.  He denies any headache blurry vision double vision numbness weakness difficulty with ambulation.  He said he knows what he wants to say he just has trouble getting Joseph words out of his mouth.  He does have a history of pacemaker, atrial fibrillation Joseph is on Eliquis .  No recent falls.   Joseph history is provided by Joseph patient Joseph Joseph spouse.   Neurologic Problem This is a new problem. Joseph current episode started more than 1 week ago. Joseph problem occurs constantly. Joseph problem has Joseph changed since onset.Pertinent negatives include no chest pain, no abdominal pain, no headaches Joseph no shortness of breath. Exacerbated by: talking. Nothing relieves Joseph symptoms. He has tried rest for Joseph symptoms. Joseph treatment provided no relief.   Neurology office visit for aphasia 05/03/23 Dr Salli Crawley  78 year old [name] here for evaluation of slurred speech, stuttering speech, hoarse voice. For past 1 year patient has had a soft voice, hoarse voice that is progressively worsened. In Joseph last 1 month has had increasing slurred speech Joseph stuttering speech. Has been noted to have some right facial weakness. 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 Has also been diagnosed  with dementia by Dr. Alonna James office about 2 years ago.   NEW ONSET SLURRED SPEECH, WORD FINDING DIFFICULTY - could be related to new stroke vs progression of dementia - check MRI brain (pacemaker 2023; [facility])   HISTORY OF DEMENTIA (vascular vs neurodegenerative; since ~2022; decline in ADLs) - continue donepezil; consider adding on memantine - safety / supervision issues reviewed - daily  physical activity / exercise (at least 15-30 minutes) - eat more plants / vegetables - increase social activities, brain stimulation, games, puzzles, hobbies, crafts, arts, music - aim for at least 7-8 hours sleep per night (or more) - avoid smoking Joseph alcohol  - caution with medications, finances; no driving   Past Medical History:  Diagnosis Date   1st degree AV block    Atrial fibrillation (HCC)    Diastolic dysfunction 4/13   grade 1   Hypertension    New onset atrial fibrillation (HCC) 06/2015   RBBB    Sinus bradycardia    Stroke (HCC)    no defecits, pt denies having a stroke, says Dr Loetta Ringer diagnosed this    Past Surgical History:  Procedure Laterality Date   BIV PACEMAKER INSERTION CRT-P N/A 01/26/2022   Procedure: BIV PACEMAKER INSERTION CRT-P;  Surgeon: Tammie Fall, MD;  Location: [facility] INVASIVE CV LAB;  Service: Cardiovascular;  Laterality: N/A;   COLONOSCOPY     COLONOSCOPY  12/30/2011   Procedure: COLONOSCOPY;  Surgeon: Suzette Espy, MD;  Location: [facility];  Service: Endoscopy;  Laterality: N/A;  1:45PM   COLONOSCOPY N/A 04/19/2019   Procedure: COLONOSCOPY;  Surgeon: Suzette Espy, MD;  Location: [facility];  Service: Endoscopy;  Laterality: N/A;  2:00pm   EXCISION MASS LOWER EXTREMETIES Left 05/12/2018   Procedure: Left foot plantar fibroma excision;  Surgeon: Amada Backer, MD;  Location: [facility];  Service: Orthopedics;  Laterality: Left;   FOOT SURGERY Left    LEFT HEART CATH Joseph CORONARY ANGIOGRAPHY N/A 01/26/2022   Procedure: LEFT HEART CATH Joseph CORONARY ANGIOGRAPHY;  Surgeon: Millicent Ally, MD;  Location: [facility] INVASIVE CV LAB;  Service: Cardiovascular;  Laterality: N/A;   SPERMATOCELECTOMY Right 11/13/2019   Procedure: EXCISION OF SPERMATIC CORD CYST;  Surgeon: Marco Severs, MD;  Location: [facility]S;  Service: Urology;  Laterality: Right;   VASECTOMY  11/13/2019   Procedure: VASECTOMY;  Surgeon: Marco Severs, MD;  Location:  [facility];  Service: Urology;;    Family History  Problem Relation Age of Onset   Cancer Brother    CVA Mother    Hypertension Father    Colon cancer Neg Hx     Social History:  reports that he quit smoking about 29 years ago. His smoking use included cigarettes. He has never used smokeless tobacco. He reports that he does Joseph currently use alcohol . He reports that he does Joseph use drugs.  Allergies: Joseph on File  Medications: I have reviewed Joseph patient's current medications.  Joseph PMH, PSH, Medications, Allergies, Joseph SH were reviewed Joseph updated.  ROS: Constitutional: Negative for fever, weight loss Joseph weight gain. Cardiovascular: Negative for chest pain Joseph dyspnea on exertion. Respiratory: Is Joseph experiencing shortness of breath at rest. Gastrointestinal: Negative for nausea Joseph vomiting. Neurological: Negative for headaches. Psychiatric: Joseph patient is Joseph nervous/anxious  Blood pressure (!) 185/83, pulse 63, SpO2 96%.  PHYSICAL EXAM:  Exam: General: Well-developed, well-nourished Communication Joseph Voice: breathy weak, raspy Respiratory Respiratory effort: Equal inspiration Joseph expiration without stridor  Cardiovascular Peripheral Vascular: Warm extremities with equal color/perfusion Eyes: No nystagmus with equal extraocular motion bilaterally Neuro/Psych/Balance: Patient oriented to person, place, Joseph time; Appropriate mood Joseph affect; Gait is intact with no imbalance; Cranial nerves I-XII are intact Head Joseph Face Inspection: Normocephalic Joseph atraumatic without mass or lesion Palpation: Facial skeleton intact without bony stepoffs Salivary Glands: No mass or tenderness Facial Strength: Facial motility symmetric Joseph full bilaterally ENT Pinna: External ear intact Joseph fully developed External canal: Canal is patent with intact skin Tympanic Membrane: Clear Joseph mobile External Nose: No scar or anatomic deformity Internal Nose: Septum is deviated to Joseph left. No polyp,  or purulence. Mucosal edema Joseph erythema present.  Bilateral inferior turbinate hypertrophy.  Lips, Teeth, Joseph gums: Mucosa Joseph teeth intact Joseph viable TMJ: No pain to palpation with full mobility Oral cavity/oropharynx: No erythema or exudate, no lesions present Nasopharynx: No mass or lesion with intact mucosa Hypopharynx: Intact mucosa without pooling of secretions Larynx Glottic: Full true vocal cord mobility without lesion or mass, evidence of b/l VF atrophy Joseph glottic insufficiency Supraglottic: Normal appearing epiglottis Joseph AE folds Interarytenoid Space: Moderate pachydermia&edema Subglottic Space: Patent without lesion or edema Neck Neck Joseph Trachea: Midline trachea without mass or lesion Thyroid : No mass or nodularity Lymphatics: No lymphadenopathy  Procedure:  Preoperative diagnosis: hoarseness VF atrophy Joseph glottic insufficiency   Postoperative diagnosis:   same  Procedure: Flexible fiberoptic laryngoscopy  Surgeon: Artice Last, MD  Anesthesia: Topical lidocaine  Joseph Afrin  Complications: None  Condition is stable throughout exam  Indications Joseph consent:   Joseph patient presents to Joseph clinic with hoarseness. All Joseph risks, benefits, Joseph potential complications were reviewed with Joseph patient preoperatively Joseph informed verbal consent was obtained.  Procedure: Joseph patient was seated upright in Joseph exam chair.   Topical lidocaine  Joseph Afrin were applied to Joseph nasal cavity. After adequate anesthesia had occurred, Joseph flexible telescope was passed into Joseph nasal cavity. Joseph nasopharynx was patent without mass or lesion. Joseph scope was passed behind Joseph soft palate Joseph directed toward Joseph base of tongue. Joseph base of tongue was visualized Joseph was symmetric with no apparent masses or abnormal appearing tissue. There were no signs of a mass or pooling of secretions in Joseph piriform sinuses. Joseph supraglottic structures were normal.  Joseph true vocal cords are mobile. Joseph  medial edges were bowed. Closure was incomplete with spindle shaped glottic gap. Joseph laryngoscope was then slowly withdrawn Joseph Joseph patient tolerated Joseph procedure well. There were no complications or blood loss.   Studies Reviewed: 02/13/21 MRI brain 1. Compared with 2012 MRI, there has been extensive progression of chronic microvascular ischemic changes. There has been extensive progression of numerous areas of chronic microhemorrhage. Pattern most consistent with chronic microvascular ischemic change Joseph poorly controlled hypertension. Correlate with clinical risk factors. 2. No acute infarct or hydrocephalus.     04/22/23 CT head  1. No acute intracranial hemorrhage or infarct. 2. Multiple remote lacunar infarcts as outlined above. 3. Mild senescent change.    Assessment/Plan: Encounter Diagnoses  Name Primary?   Dysphonia Yes   Glottic insufficiency    Vocal fold atrophy    Post-nasal drip    Environmental Joseph seasonal allergies    Chronic hoarseness      Assessment Joseph Plan    Chronic dysphonia x 2 yrs, Vocal Cord Atrophy/Glottic insufficiency on strobe exam today Presents with a two-year history of progressively worsening voice changes. Strobe today revealed vocal cord atrophy with thinning Joseph  incomplete closure during phonation, likely age-related. No tumors or masses; vocal cords are clear Joseph mobile. Initial management involves voice therapy to improve lung support Joseph voice projection. If ineffective, procedural interventions to augment vocal cords are available. This is a quality of life issue, Joseph life-threatening. Discussed controlling postnasal drainage with Flonase /antihistamine - Refer to voice therapy - Consider procedural interventions if voice therapy is ineffective  Chronic post-nasal drip Joseph suspected environmental allergies - trial of Flonase  2 puffs b/l nares - continue Xyzal  5 g daily   Atrial Fibrillation Managed on Eliquis . No changes in  management discussed. - hx of lacunar infarct per records  Dementia Affects ability to provide a complete history. Spouse assists with history-taking Joseph daily activities. He also is being f/b neurology for workup of aphasia Joseph dysarthria, MRI brain pending.    Follow-up - Schedule follow-up in a few months to reassess voice changes - Option to call Joseph schedule follow-up if needed.  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 Voice therapy has shown limited success which could be due to memory problems hx of dementia. He prefers non-surgical options Joseph is considering a filler injection augmentation Joseph would like to think about it for now Joseph continue with speech therapy for now.  - Provided information about Joseph office procedure for filler injection augmentation. - Encouraged continuation of voice therapy. - Advised to contact Joseph office if he decides to proceed with injection augmentation      Bettymae Yott, MD Otolaryngology [facility] Phone: [phone] Fax: [phone]    11/12/2023, 9:32 AM

## 2023-12-10 NOTE — Addendum Note (Signed)
 Addended by: Lott Rouleau A on: 12/10/2023 01:10 PM   Modules accepted: Orders

## 2023-12-10 NOTE — Progress Notes (Signed)
 Remote pacemaker transmission.

## 2023-12-28 DIAGNOSIS — N189 Chronic kidney disease, unspecified: Secondary | ICD-10-CM | POA: Diagnosis not present

## 2023-12-28 DIAGNOSIS — E611 Iron deficiency: Secondary | ICD-10-CM | POA: Diagnosis not present

## 2023-12-28 DIAGNOSIS — R809 Proteinuria, unspecified: Secondary | ICD-10-CM | POA: Diagnosis not present

## 2023-12-28 DIAGNOSIS — E211 Secondary hyperparathyroidism, not elsewhere classified: Secondary | ICD-10-CM | POA: Diagnosis not present

## 2023-12-28 DIAGNOSIS — D631 Anemia in chronic kidney disease: Secondary | ICD-10-CM | POA: Diagnosis not present

## 2024-01-03 DIAGNOSIS — N1831 Chronic kidney disease, stage 3a: Secondary | ICD-10-CM | POA: Diagnosis not present

## 2024-01-03 DIAGNOSIS — I5022 Chronic systolic (congestive) heart failure: Secondary | ICD-10-CM | POA: Diagnosis not present

## 2024-01-03 DIAGNOSIS — D638 Anemia in other chronic diseases classified elsewhere: Secondary | ICD-10-CM | POA: Diagnosis not present

## 2024-01-03 DIAGNOSIS — N2581 Secondary hyperparathyroidism of renal origin: Secondary | ICD-10-CM | POA: Diagnosis not present

## 2024-01-04 ENCOUNTER — Ambulatory Visit: Attending: Cardiovascular Disease | Admitting: Cardiovascular Disease

## 2024-01-04 ENCOUNTER — Encounter: Payer: Self-pay | Admitting: Cardiovascular Disease

## 2024-01-04 VITALS — BP 144/96 | HR 63 | Ht 71.0 in | Wt 223.0 lb

## 2024-01-04 DIAGNOSIS — Z7901 Long term (current) use of anticoagulants: Secondary | ICD-10-CM | POA: Diagnosis not present

## 2024-01-04 DIAGNOSIS — I5022 Chronic systolic (congestive) heart failure: Secondary | ICD-10-CM | POA: Diagnosis not present

## 2024-01-04 DIAGNOSIS — E785 Hyperlipidemia, unspecified: Secondary | ICD-10-CM | POA: Diagnosis not present

## 2024-01-04 DIAGNOSIS — I480 Paroxysmal atrial fibrillation: Secondary | ICD-10-CM | POA: Diagnosis not present

## 2024-01-04 DIAGNOSIS — Z950 Presence of cardiac pacemaker: Secondary | ICD-10-CM | POA: Diagnosis not present

## 2024-01-04 DIAGNOSIS — I4510 Unspecified right bundle-branch block: Secondary | ICD-10-CM | POA: Diagnosis not present

## 2024-01-04 DIAGNOSIS — I495 Sick sinus syndrome: Secondary | ICD-10-CM

## 2024-01-04 DIAGNOSIS — I10 Essential (primary) hypertension: Secondary | ICD-10-CM

## 2024-01-04 DIAGNOSIS — I428 Other cardiomyopathies: Secondary | ICD-10-CM

## 2024-01-04 DIAGNOSIS — D569 Thalassemia, unspecified: Secondary | ICD-10-CM

## 2024-01-04 NOTE — Patient Instructions (Signed)
 Medication Instructions:   MAKE SURE TO TAKE ENTRESTO  TWICE DAILY  *If you need a refill on your cardiac medications before your next appointment, please call your pharmacy*  Lab Work:  Your physician recommends that you return for lab work FASTING  If you have labs (blood work) drawn today and your tests are completely normal, you will receive your results only by: MyChart Message (if you have MyChart) OR A paper copy in the mail If you have any lab test that is abnormal or we need to change your treatment, we will call you to review the results.  Testing/Procedures:  Your physician has requested that you have an echocardiogram. Echocardiography is a painless test that uses sound waves to create images of your heart. It provides your doctor with information about the size and shape of your heart and how well your heart's chambers and valves are working. This procedure takes approximately one hour. There are no restrictions for this procedure. Please do NOT wear cologne, perfume, aftershave, or lotions (deodorant is allowed). Please arrive 15 minutes prior to your appointment time.  Please note: We ask at that you not bring children with you during ultrasound (echo/ vascular) testing. Due to room size and safety concerns, children are not allowed in the ultrasound rooms during exams. Our front office staff cannot provide observation of children in our lobby area while testing is being conducted. An adult accompanying a patient to their appointment will only be allowed in the ultrasound room at the discretion of the ultrasound technician under special circumstances. We apologize for any inconvenience. Joseph [name]   Follow-Up: At [facility], you and your health needs are our priority.  As part of our continuing mission to provide you with exceptional heart care, our providers are all part of one team.  This team includes your primary Cardiologist (physician) and Advanced Practice  Providers or APPs (Physician Assistants and Nurse Practitioners) who all work together to provide you with the care you need, when you need it.  Your next appointment:   2-3 month(s)  Provider:   DR DIANNAH MAYWOOD

## 2024-01-04 NOTE — Progress Notes (Signed)
 Patient ID: Joseph [name], male   DOB: 1945-10-16, 78 y.o.   MRN: [id number]       Primary M.D.: Dr. Elspeth Lolling  HPI: Joseph [name] is a 78 y.o. male who presents to the office for a 3 year f/u cardiology evaluation.  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 He has a long-standing history of hypertension. A CT scan in 2013 suggested small lacunar infarctions most likely due to hypertension. I initially saw in April 2013 after this CT imaging study was done. He has documented normal systolic function with mild concentric LVH with diastolic dysfunction. An echo Doppler study in April 2013 revealed his mitral valve E/A ratio is 3.8 suggestive of restrictive physiology. He has documented renal cysts involving the left kidney showing a large inferior pole cortical cyst is measured 4.5 x 4.7 x 4.4 cm. In addition there was a second area at the mid pole laterally which appeared multi-septated and measured 2.7 x 2.7 x 1.9. I  recommended urologic evaluation which he did at [facility] and he was told that these were benign.  Joseph [name] has a history of moderate obesity.  In the past.  He had weight as high as 250 pounds.  Over the past several years.  His weight has been in the 220s.  When I last saw him, he was walking 3 miles at a time 3 days per week and denied any exertional chest pain or palpitations.   Over the past several years, he has been seen by [name] PA and also saw [name], [name] for blood pressure management.  He last saw [name] on 03/18/2017, who switched losartan  to irbesartan .  His blood pressure medications now include amlodipine  10 mg, irbesartan  300 mg, doxazosin  8 mg,minoxidil  5 mg, and Toprol , which she has been taking 25 mg as needed, but has not been taking this regularly.  He denies any episodes of chest pressure.  His last echo Doppler study was in December 2016 which showed mild LVH, EF 50-55% with grade 2  diastolic dysfunction.   When I saw him in October 2018 he complained of fatigue and had noted a slow pulse.  He was bradycardic with heart rates in the 40s and I discontinued Toprol .  He was hypertensive and I recommended initiation of hydralazine  25 mg twice a day.  I scheduled him for renal Doppler studies to make certain there was no renal vascular etiology to his hypertension on multiple medical drug regimen.  Renal Doppler study did not demonstrate any evidence for renal artery stenosis.  He again was noted to have previously documented renal cysts with the right upper pole cyst at 1.5 x 1.7 cm, and a left exophytic lower pole cyst at 4.5 x 5.4 cm and left upper pole striated cyst at 3.6 x 2.5 cm.  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  He denies any abdominal complaints.  He had stopped taking hydralazine  when he called the office in December 2018 due to feeling that he was having lower extremity edema.  He also felt that the hydralazine  was contributing to his fatigue.  His edema has improved with diabetic socks.    I saw him in January 2019 at which time he was bradycardic with a heart rate at 47 bpm.  He was unaware of any recurrent atrial fibrillation episodes and continue to be on Eliquis  5  mg twice a day.  His blood pressure was controlled on amlodipine  10 mg, Cardura  4 mg at bedtime, Erbe Sartain 300 mg, in addition to minoxidil  5 mg daily.  I saw him in January 2020 and at that time his blood pressure was elevated at 158/80.  With his previous history of grade 2 diastolic dysfunction I elected to add Spironolactone  12.5 mg to his regimen. Remotely I had placed him on hydralazine  which he did not tolerate.   He was evaluated by me in a telemedicine visit in April 2020.  He denied any chest pain or awareness of recurrent arrhythmia.  He states his blood pressure at times is up and down.  He apparently has only been taking the  Spironolactone  every other day.  At times his blood pressure can reach 160 systolically.  Pulse 50.  He recently began to exercise more than he had previously and has been walking 3 miles at least 3 days/week.  He underwent a follow-up echo Doppler study in February 2020 which showed mild concentric LVH with EF 55 to 60%.  Diastolic function could not be evaluated due to poor image quality   When I saw him on August 10, 2019  he informed me that his blood pressure was typically  greater than 150 systolically but at times there have been some instances where it was in the 120s.  At times his heart rate was low but he denied any significant dizziness.  His ECG however showed bradycardia at 47 bpm with a junctional rhythm and intermittent P waves.  I recommended follow-up laboratory and suggested he wear a 2-week event monitor or Zio patch.    I saw him in follow-up on October 03, 2019.  At that time, Zio patch data was not yet back.  This ultimately demonstrated an average heart rate at 68 bpm with a minimum of 36 which occurred at 5:37 AM and fastest sinus rate at 149 bpm.  Was a single episode of 5 beat run of nonsustained VT and there were several episodes of SVT.  He was evaluated in the emergency room on November 06, 2019 with dizziness apparently after eating a brownie at home which was laced with CBD/THC.  Head CT was normal.  He saw [name] in follow-up prior to undergoing spermatic cord surgery.  Eliquis  was held for the surgery and he tolerated surgery well.  He was last evaluated by [name] on January 10, 2020.  At that time no medication adjustment was made.  His blood pressure was stable on amlodipine  10 mg irbesartan  300 mg and spironolactone  12.5 mg.  I evaluated him on March 13, 2020 and at that time he denied any recurrent chest pain.  He denies any chest pain.  He does not take his medicine altogether and typically takes amlodipine  10 mg in the latter part of the morning, doxazosin  8 mg at  bedtime, irbesartan  300 mg in the morning, minoxidil  2.5 mg and spironolactone  12.5 mg in the morning.   This morning he did not take his morning medications.  He typically goes to bed at 11 PA and often wakes up at 3 AM to urinate.  He is often up the good between 8 and 9 AM.  During that evaluation, his blood pressure was elevated and I recommended he take his medicines in the morning but switch amlodipine  to 10 mg at bedtime which should provide him with early morning coverage.  He had not had a recent echo Doppler and  scheduled him for follow-up evaluation.  The echo Doppler study was done on June 12, 2021 which showed an EF of 55 to 60%.  There was moderate LVH and grade 1 diastolic dysfunction.  Left atrium was moderately dilated.    I saw him on August 19, 2020 at which time he denied any chest pain.  However he continued to experience occasional episodes of lightheadedness and at times he noted pulse irregularity., He denies shortness of breath.  He denied any significant orthostatic symptoms.  He continues to be on amlodipine  10 mg, irbesartan  300 mg, minoxidil  5 mg, and spironolactone  12.5 mg daily.  He is on Eliquis  for anticoagulation and denies bleeding.  His ECG shows sinus bradycardia with periods of short PR as well as junctional rhythm.  I recommended a follow-up 2-week monitor for reassessment.  His blood pressure was stable without orthostatic change.  He wore a Zio patch monitor from February 13 through September 07, 2020 for 13 days and 22 hours.  The predominant rhythm was sinus rhythm at 66 bpm.  His slowest heart rate was sinus bradycardia at 38 bpm which occurred while sleeping and the fastest heart rate was sinus tachycardia at 157 bpm.  He had first-degree AV block.  There were 10 brief episodes of nonsustained VT with the fastest interval lasting 4 beats with a maximum rate of 187 no longest episode lasting 7 beats with an average rate of 112 bpm.  There were frequent short  episodes of SVT totaling 278 with the run with the fastest interval lasting 5 beats at a maximum rate at 197 and the longest episode lasting 19 beats with an average rate at 95.  There were frequent isolated PVCs and occasional isolated PVCs with couplets and rare atrial triplets.  With his bradycardia I did not feel he was a candidate for long-acting beta-blocker therapy on a daily basis but recommended addition of metoprolol  tartrate 12.5 to 25 mg on a as needed basis.  I last saw him on December 19, 2020.  At that time, he was experiencing low back discomfort after falling and skipping a step.  He is unaware of any recurrent palpitations.  He denies presyncope or syncope.  He sees Dr. Sherrilee for excessive urination and continues to follow-up with Dr. Knowlton for primary care.  He denies any chest pain or shortness of breath.    Since I saw him, he has been evaluated by [name], PA in February 2023.  In July 2014 he was admitted to the hospital with symptomatic tachybradycardia syndrome and had A-fib with RVR with transition to sinus bradycardia/junctional rhythm.  TTE showed new low EF at 35 to 40%.  Cardiac catheterization did not reveal any obstructive CAD.  He underwent CRT pacemaker by Dr. Waddell.  At discharge she was started on carvedilol  and his irbesartan  was switched to low-dose Entresto .  He was subsequently admitted to the hospital on February 03, 2002 with hypotension and dizziness with low blood pressure.  At that time medications were adjusted.  He last saw Delon Holts, PA-C on February 18, 2022 and at that time blood pressure was stable.  He denied any heart failure symptoms orthopnea edema or shortness of breath or recurrent chest pain.  Most recently, he has been evaluated by his new primary physician in [place] since Dr. Nemiah has retired.  Currently, the patient states he feels well.  He denies chest pain shortness of breath dizziness or palpitations.  He is on Eliquis  5 mg  twice a  day in addition to carvedilol  12.5 mg twice a day, clonidine  0.1 mg twice a day, Cardura  4 mg at bedtime, HCTZ 25 mg, spironolactone  12.5 mg, and is supposed to be taking Entresto  24/26 mg twice a day but according to his wife apparently is only taking this once a day.  He presents for evaluation.   Past Medical History:  Diagnosis Date   1st degree AV block    Atrial fibrillation (HCC)    Diastolic dysfunction 4/13   grade 1   Hypertension    New onset atrial fibrillation (HCC) 06/2015   RBBB    Sinus bradycardia    Stroke (HCC)    no defecits, pt denies having a stroke, says Dr Burnard diagnosed this    Past Surgical History:  Procedure Laterality Date   BIV PACEMAKER INSERTION CRT-P N/A 01/26/2022   Procedure: BIV PACEMAKER INSERTION CRT-P;  Surgeon: Waddell Danelle ORN, MD;  Location: [facility] INVASIVE CV LAB;  Service: Cardiovascular;  Laterality: N/A;   COLONOSCOPY     COLONOSCOPY  12/30/2011   Procedure: COLONOSCOPY;  Surgeon: Lamar CHRISTELLA Hollingshead, MD;  Location: [facility];  Service: Endoscopy;  Laterality: N/A;  1:45PM   COLONOSCOPY N/A 04/19/2019   Procedure: COLONOSCOPY;  Surgeon: Hollingshead Lamar CHRISTELLA, MD;  Location: [facility];  Service: Endoscopy;  Laterality: N/A;  2:00pm   EXCISION MASS LOWER EXTREMETIES Left 05/12/2018   Procedure: Left foot plantar fibroma excision;  Surgeon: Kit Rush, MD;  Location: [facility];  Service: Orthopedics;  Laterality: Left;   FOOT SURGERY Left    LEFT HEART CATH AND CORONARY ANGIOGRAPHY N/A 01/26/2022   Procedure: LEFT HEART CATH AND CORONARY ANGIOGRAPHY;  Surgeon: Burnard Debby LABOR, MD;  Location: [facility] INVASIVE CV LAB;  Service: Cardiovascular;  Laterality: N/A;   SPERMATOCELECTOMY Right 11/13/2019   Procedure: EXCISION OF SPERMATIC CORD CYST;  Surgeon: Sherrilee Belvie CROME, MD;  Location: [facility];  Service: Urology;  Laterality: Right;   VASECTOMY  11/13/2019   Procedure: VASECTOMY;  Surgeon: Sherrilee Belvie CROME, MD;  Location: [facility];  Service:  Urology;;    Not on File  Current Outpatient Medications  Medication Sig Dispense Refill   alfuzosin  (UROXATRAL ) 10 MG 24 hr tablet Take 1 tablet (10 mg total) by mouth at bedtime. 90 tablet 3   apixaban  (ELIQUIS ) 5 MG TABS tablet Take 1 tablet (5 mg total) by mouth 2 (two) times daily. 180 tablet 1   carvedilol  (COREG ) 12.5 MG tablet TAKE 1 TABLET(12.5 MG) BY MOUTH TWICE DAILY 180 tablet 3   cholecalciferol  (VITAMIN D3) 25 MCG (1000 UNIT) tablet Take 1 tablet (1,000 Units total) by mouth daily. 90 tablet 1   cloNIDine  (CATAPRES ) 0.1 MG tablet Take 1 tablet (0.1 mg total) by mouth 2 (two) times daily. 180 tablet 0   cycloSPORINE  (RESTASIS ) 0.05 % ophthalmic emulsion Place 1 drop into both eyes 2 (two) times daily. 60 each 2   donepezil (ARICEPT) 10 MG tablet Take 10 mg by mouth daily.     doxazosin  (CARDURA ) 4 MG tablet Take 4 mg by mouth at bedtime.     fluticasone  (FLONASE ) 50 MCG/ACT nasal spray Place 2 sprays into both nostrils daily. 16 g 6   hydrochlorothiazide  (HYDRODIURIL ) 25 MG tablet Take 1 tablet (25 mg total) by mouth daily. 90 tablet 0   latanoprost  (XALATAN ) 0.005 % ophthalmic solution Place 1 drop into both eyes at bedtime. 2.5 mL 12   levocetirizine (XYZAL ) 5 MG tablet TAKE  1 TABLET(5 MG) BY MOUTH EVERY EVENING 30 tablet 3   mirabegron  ER (MYRBETRIQ ) 50 MG TB24 tablet Take 1 tablet (50 mg total) by mouth daily. 90 tablet 3   Misc Natural Products (PROSTATE HEALTH PO) Take 1 capsule by mouth daily. Super Beta Prostate     Multiple Vitamin (MULTIVITAMIN WITH MINERALS) TABS tablet Take 1 tablet by mouth daily.     Polyethyl Glycol-Propyl Glycol (LUBRICANT EYE DROPS) 0.4-0.3 % SOLN Place 1 drop into both eyes 3 (three) times daily as needed (dry/irritated eyes.).     rosuvastatin  (CRESTOR ) 5 MG tablet Take 1 tablet (5 mg total) by mouth daily. 90 tablet 3   sacubitril -valsartan  (ENTRESTO ) 24-26 MG TAKE 1 TABLET BY MOUTH TWICE DAILY 180 tablet 3   solifenacin  (VESICARE ) 5 MG  tablet Take 1 tablet (5 mg total) by mouth daily. 90 tablet 3   spironolactone  (ALDACTONE ) 25 MG tablet TAKE 1/2 TABLET BY MOUTH EVERY DAY 45 tablet 1   No current facility-administered medications for this visit.    Social History   Socioeconomic History   Marital status: Married    Spouse name: Not on file   Number of children: 5   Years of education: Not on file   Highest education level: Not on file  Occupational History   Not on file  Tobacco Use   Smoking status: Former    Current packs/day: 0.00    Types: Cigarettes    Quit date: 1996    Years since quitting: 29.4   Smokeless tobacco: Never   Tobacco comments:    quit about 40 yrs ago  Vaping Use   Vaping status: Never Used  Substance and Sexual Activity   Alcohol  use: Not Currently    Comment: 6 pack of beer or less in a week   Drug use: No   Sexual activity: Not on file  Other Topics Concern   Not on file  Social History Narrative   Not on file   Social Drivers of Health   Financial Resource Strain: Low Risk  (04/12/2023)   Overall Financial Resource Strain (CARDIA)    Difficulty of Paying Living Expenses: Not hard at all  Food Insecurity: No Food Insecurity (04/12/2023)   Hunger Vital Sign    Worried About Running Out of Food in the Last Year: Never true    Ran Out of Food in the Last Year: Never true  Transportation Needs: No Transportation Needs (04/12/2023)   PRAPARE - Administrator, Civil Service (Medical): No    Lack of Transportation (Non-Medical): No  Physical Activity: Inactive (04/12/2023)   Exercise Vital Sign    Days of Exercise per Week: 0 days    Minutes of Exercise per Session: 0 min  Stress: No Stress Concern Present (04/12/2023)   [employer] of Occupational Health - Occupational Stress Questionnaire    Feeling of Stress : Not at all  Social Connections: Socially Integrated (04/12/2023)   Social Connection and Isolation Panel    Frequency of Communication with Friends  and Family: More than three times a week    Frequency of Social Gatherings with Friends and Family: More than three times a week    Attends Religious Services: More than 4 times per year    Active Member of [employer] or Organizations: Yes    Attends Banker Meetings: More than 4 times per year    Marital Status: Married  Catering manager Violence: Not At Risk (04/12/2023)   Humiliation, Afraid,  Rape, and Kick questionnaire    Fear of Current or Ex-Partner: No    Emotionally Abused: No    Physically Abused: No    Sexually Abused: No    Family History  Problem Relation Age of Onset   Cancer Brother    CVA Mother    Hypertension Father    Colon cancer Neg Hx     ROS General: Negative; No fevers, chills, or night sweats;  HEENT: Negative; No changes in vision or hearing, sinus congestion, difficulty swallowing Pulmonary: Negative; No cough, wheezing, shortness of breath, hemoptysis Cardiovascular: See HPI GI: Negative; No nausea, vomiting, diarrhea, or abdominal pain GU: History of renal cysts; frequent urination Musculoskeletal: Negative; no myalgias, joint pain, or weakness Hematologic/Oncology: Negative; no easy bruising, bleeding Endocrine: Negative; no heat/cold intolerance; no diabetes Neuro: Negative; no changes in balance, headaches Skin: Negative; No rashes or skin lesions Psychiatric: Negative; No behavioral problems, depression Sleep: Negative; No snoring, daytime sleepiness, hypersomnolence, bruxism, restless legs, hypnogognic hallucinations, no cataplexy Other comprehensive 14 point system review is negative.   PE BP (!) 144/96   Pulse 63   Ht 5' 11 (1.803 m)   Wt 223 lb (101.2 kg)   SpO2 97%   BMI 31.10 kg/m    Repeat blood pressure by me was 130/70  Wt Readings from Last 3 Encounters:  01/04/24 223 lb (101.2 kg)  10/28/23 220 lb 1.3 oz (99.8 kg)  08/16/23 210 lb (95.3 kg)   General: Alert, oriented, no distress.  Skin: normal turgor, no  rashes, warm and dry HEENT: Normocephalic, atraumatic. Pupils equal round and reactive to light; sclera anicteric; extraocular muscles intact;  Nose without nasal septal hypertrophy Mouth/Parynx benign; Mallinpatti scale 3 Neck: No JVD, no carotid bruits; normal carotid upstroke Lungs: clear to ausculatation and percussion; no wheezing or rales Chest wall: without tenderness to palpitation Heart: PMI not displaced, RRR, s1 s2 normal, 1/6 systolic murmur, no diastolic murmur, no rubs, gallops, thrills, or heaves Abdomen: soft, nontender; no hepatosplenomehaly, BS+; abdominal aorta nontender and not dilated by palpation. Back: no CVA tenderness Pulses 2+ Musculoskeletal: full range of motion, normal strength, no joint deformities Extremities: no clubbing cyanosis or edema, Homan's sign negative  Neurologic: grossly nonfocal; Cranial nerves grossly wnl Psychologic: Normal mood and affect  EKG Interpretation Date/Time:  Tuesday January 04 2024 11:36:59 EDT Ventricular Rate:  63 PR Interval:  228 QRS Duration:  184 QT Interval:  482 QTC Calculation: 493 R Axis:   237  Text Interpretation: Atrial-sensed ventricular-paced rhythm with prolonged AV conduction When compared with ECG of 22-Apr-2023 15:57, PREVIOUS ECG IS PRESENT Confirmed by Burnard Ned ([id number]) on 01/04/2024 1:29:20 PM    December 19, 2020 ECG (independently read by me): Sinus bradycardia at 54; PAC. 1st degree AV block; RBBB, LAHB, LVH  February 2022 ECG (independently read by me): Sinus bradycardia at 48 bpm with  some junctional beats.  Short PR interval at 96 msec PVC.  Right bundle branch block with repolarization changes.  March 13, 2020 ECG (independently read by me): Sinus Bradycardia at 52, PVC, RBBB  October 03, 2019 ECG (independently read by me): Sinus rhythm at 65 bpm with occasional PVCs.  Right bundle branch block with repolarization, left anterior hemiblock, LVH by voltage.  August 10, 2019 ECG (independently read  by me): Bradycardia at 47 bpm with junctional rhyhm, intermittent P waves  August 09, 2018 ECG (independently read by me): Sinus bradycardia at 65 bpm with sinus arrhythmia, PACs, first-degree AV block with a  PR interval 2 6 6  ms, right bundle branch block with repolarization changes, left anterior hemiblock.  Septal Q wave V1.  July 21, 2017 ECG (independently read by me): Sinus bradycardia at 47 bpm.  First-degree AV block with a PR interval of 222 ms.  Right bundle branch block with repolarization changes.  PAC.  October 2018 ECG (independently read by me): Sinus bradycardia at 47 bpm. Accelerated AV conduction with a PR interval at 88 ms.  Right bundle-branch block with repolarization changes.  However, question blocked PACs every other beat.  April 2016 ECG (independently read by me): Sinus bradycardia at 54 bpm with right bundle branch block.  PAC.  QTc interval 479 msec.  PR interval 196 ms.  September 2014 ECG: Sinus bradycardia with first-degree AV block. Heart rate 46 beats per minute. Right bundle branch block, unchanged.  LABS:    Latest Ref Rng & Units 04/22/2023    4:02 PM 02/25/2023   11:35 AM 02/18/2022   12:00 PM  BMP  Glucose 70 - 99 mg/dL 899  98  898   BUN 8 - 23 mg/dL 25  16  24    Creatinine 0.61 - 1.24 mg/dL 8.60  8.61  8.64   BUN/Creat Ratio 10 - 24  12  18    Sodium 135 - 145 mmol/L 132  142  139   Potassium 3.5 - 5.1 mmol/L 4.0  4.4  5.0   Chloride 98 - 111 mmol/L 100  105  102   CO2 22 - 32 mmol/L 24  22  26    Calcium  8.9 - 10.3 mg/dL 8.5  9.2  9.5       Latest Ref Rng & Units 04/22/2023    4:02 PM 02/25/2023   11:35 AM 01/23/2022    7:15 PM  Hepatic Function  Total Protein 6.5 - 8.1 g/dL 6.7  6.8  6.4   Albumin 3.5 - 5.0 g/dL 3.8  4.2  3.5   AST 15 - 41 U/L 21  21  21    ALT 0 - 44 U/L 15  11  17    Alk Phosphatase 38 - 126 U/L 66  88  70   Total Bilirubin 0.3 - 1.2 mg/dL 1.1  1.0  1.2       Latest Ref Rng & Units 04/22/2023    4:02 PM 02/25/2023    11:35 AM 02/03/2022    3:26 PM  CBC  WBC 4.0 - 10.5 K/uL 4.5  3.8  3.8   Hemoglobin 13.0 - 17.0 g/dL 88.2  87.8  86.1   Hematocrit 39.0 - 52.0 % 38.6  39.7  43.9   Platelets 150 - 400 K/uL 223  221  206    Lab Results  Component Value Date   MCV 71.3 (L) 04/22/2023   MCV 70 (L) 02/25/2023   MCV 69.0 (L) 02/03/2022   Lab Results  Component Value Date   TSH 1.110 02/25/2023   Lab Results  Component Value Date   HGBA1C 6.2 (H) 06/29/2023     Lipid Panel     Component Value Date/Time   CHOL 166 06/29/2023 1125   TRIG 78 06/29/2023 1125   HDL 57 06/29/2023 1125   CHOLHDL 2.9 06/29/2023 1125   LDLCALC 94 06/29/2023 1125    RADIOLOGY: No results found.  IMPRESSION:  1. NICM (nonischemic cardiomyopathy) (HCC)   2. Essential hypertension   3. Paroxysmal atrial fibrillation (HCC)   4. Sinus node dysfunction (HCC)   5. Pacemaker   6.  Chronic systolic heart failure (HCC)   7. Hyperlipidemia with target LDL less than 70   8. RBBB   9. Anticoagulated   10. Thalassemia, unspecified type     ASSESSMENT AND PLAN: Mr. Pulis is 78 year old retired [place] American male who has a history of hypertension and CT evidence for small lacunar infarctions most likely of hypertensive etiology. There also is a remote history of rheumatic fever age 80. He developed transient symptoms of right arm weakness with numbness in August 2012. An MRI of his head showed scattered small white matter hyper intensities bilaterally with also involvement in the right putamen, bilateral thalamus the also he has some additional changes in the pons, right and left inferior cerebellum.  A prior echo Doppler study has shown restrictive physiology.  His  echo Doppler study in December 2016 showed an EF of 50-55%, LVH, and grade 2 diastolic dysfunction.  There was mild biatrial enlargement.  Remotely,   he had issues  with bradycardia leading to discontinuance of his metoprolol .  When I saw him in August 10, 2019,  his ECG showed a junctional rhythm there were periods where there were P waves with short PR interval and other times without P waves.  He was not on any negative chronotropic medications.  At that time laboratory revealed a creatinine of  1.01 with potassium 4.6.   TSH was normal at 1.28.  Lipid studies were stable with total cholesterol 165 triglycerides 56 HDL 73 LDL 81.  Magnesium was 1.9.  He was not anemic but had microcytic indices with an MCV of 70 which is consistent with his known thalassemia.  On subsequent ECG there was  resolution of his previous junctional rhythm.  On remote Zio patch monitoring he was felt to have sinus node dysfunction but also had periods of short-lived SVT with interval lasting 4 beats with a maximum rate at 190 and the longest interval lasting 21.3 seconds with an average rate of 100 bpm.  There were isolated PACs, rare atrial couplets or triplets.  There was an episode of ventricular bigeminy.  Since I last saw him, he apparently underwent [employer] BiV pacemaker insertion CRT-P by Dr. Waddell on January 26, 2022 for symptomatic sinus node dysfunction, AF with RVR and high grade AV block.  Presently, Mr. Kuyper has been maintained on Eliquis  anticoagulation.  He has been on guideline directed medical therapy with carvedilol  12.5 mg twice a day, Entresto  24/26 mg which is supported twice a day but according to his wife he has only been taking once a day, spironolactone  12.5 mg daily, in addition to Cardura  4 mg, and HCTZ.  Presently he denies any chest pain or shortness of breath.  He denies any dizziness.  He has not had recent laboratory since December.  His last echo Doppler study in August 2023 showed an EF of 35 to 40%.  He is on rosuvastatin  5 mg for hyperlipidemia.  Presently, I am recommending he undergo fasting laboratory with a comprehensive metabolic panel, CBC, TSH, fasting lipid panel and will also obtain LP(a).  He lives in [place].  I have scheduled him for a  follow-up echo Doppler reevaluation to evaluate in [place].  His blood pressure when checked by me was stable but was elevated upon arrival and the patient's wife states he had just taken his medication upon arrival prior to his initial blood pressure assessment.  I discussed my imminent retirement.  I will transition him to be followed by Dr. Diannah  Mallipeddi in our [place] office for follow-up Cardiologic evaluation and he will continue to see Dr. Waddell for pacemaker checks until his retirement.    Debby RONAL Sor, MD, FACC  01/04/2024 1:40 PM

## 2024-01-19 ENCOUNTER — Ambulatory Visit: Admitting: Cardiology

## 2024-01-19 ENCOUNTER — Other Ambulatory Visit: Payer: Self-pay | Admitting: Cardiovascular Disease

## 2024-01-19 DIAGNOSIS — I480 Paroxysmal atrial fibrillation: Secondary | ICD-10-CM | POA: Diagnosis not present

## 2024-01-19 DIAGNOSIS — I5022 Chronic systolic (congestive) heart failure: Secondary | ICD-10-CM | POA: Diagnosis not present

## 2024-01-19 DIAGNOSIS — E785 Hyperlipidemia, unspecified: Secondary | ICD-10-CM | POA: Diagnosis not present

## 2024-01-23 LAB — COMPREHENSIVE METABOLIC PANEL WITH GFR
AG Ratio: 1.6 (calc) (ref 1.0–2.5)
ALT: 10 U/L (ref 9–46)
AST: 15 U/L (ref 10–35)
Albumin: 4.1 g/dL (ref 3.6–5.1)
Alkaline phosphatase (APISO): 70 U/L (ref 35–144)
BUN/Creatinine Ratio: 17 (calc) (ref 6–22)
BUN: 27 mg/dL — ABNORMAL HIGH (ref 7–25)
CO2: 27 mmol/L (ref 20–32)
Calcium: 8.8 mg/dL (ref 8.6–10.3)
Chloride: 106 mmol/L (ref 98–110)
Creat: 1.59 mg/dL — ABNORMAL HIGH (ref 0.70–1.28)
Globulin: 2.6 g/dL (ref 1.9–3.7)
Glucose, Bld: 100 mg/dL — ABNORMAL HIGH (ref 65–99)
Potassium: 3.9 mmol/L (ref 3.5–5.3)
Sodium: 141 mmol/L (ref 135–146)
Total Bilirubin: 0.9 mg/dL (ref 0.2–1.2)
Total Protein: 6.7 g/dL (ref 6.1–8.1)
eGFR: 45 mL/min/1.73m2 — ABNORMAL LOW (ref >=60)

## 2024-01-23 LAB — LIPID PANEL
Cholesterol: 180 mg/dL (ref <200)
HDL: 55 mg/dL (ref >=40)
LDL Cholesterol (Calc): 106 mg/dL — ABNORMAL HIGH
Non-HDL Cholesterol (Calc): 125 mg/dL (ref <130)
Total CHOL/HDL Ratio: 3.3 (calc) (ref <5.0)
Triglycerides: 96 mg/dL (ref <150)

## 2024-01-23 LAB — CBC
HCT: 40.9 % (ref 38.5–50.0)
Hemoglobin: 12.1 g/dL — ABNORMAL LOW (ref 13.2–17.1)
MCH: 21.7 pg — ABNORMAL LOW (ref 27.0–33.0)
MCHC: 29.6 g/dL — ABNORMAL LOW (ref 32.0–36.0)
MCV: 73.3 fL — ABNORMAL LOW (ref 80.0–100.0)
MPV: 11.3 fL (ref 7.5–12.5)
Platelets: 221 Thousand/uL (ref 140–400)
RBC: 5.58 Million/uL (ref 4.20–5.80)
RDW: 14.4 % (ref 11.0–15.0)
WBC: 4.1 Thousand/uL (ref 3.8–10.8)

## 2024-01-23 LAB — LIPOPROTEIN A (LPA): Lipoprotein (a): 81 nmol/L — ABNORMAL HIGH (ref <75)

## 2024-01-23 LAB — TSH: TSH: 1.36 m[IU]/L (ref 0.40–4.50)

## 2024-01-25 ENCOUNTER — Ambulatory Visit (INDEPENDENT_AMBULATORY_CARE_PROVIDER_SITE_OTHER): Payer: Medicare PPO

## 2024-01-25 DIAGNOSIS — I428 Other cardiomyopathies: Secondary | ICD-10-CM | POA: Diagnosis not present

## 2024-01-25 LAB — CUP PACEART REMOTE DEVICE CHECK
Battery Remaining Longevity: 61 mo
Battery Remaining Percentage: 72 %
Battery Voltage: 2.99 V
Brady Statistic AP VP Percent: 68 %
Brady Statistic AP VS Percent: 1 %
Brady Statistic AS VP Percent: 30 %
Brady Statistic AS VS Percent: 1 %
Brady Statistic RA Percent Paced: 67 %
Date Time Interrogation Session: 20250715023151
Implantable Lead Connection Status: 753985
Implantable Lead Connection Status: 753985
Implantable Lead Connection Status: 753985
Implantable Lead Implant Date: 20230717
Implantable Lead Implant Date: 20230717
Implantable Lead Implant Date: 20230717
Implantable Lead Location: 753858
Implantable Lead Location: 753859
Implantable Lead Location: 753860
Implantable Pulse Generator Implant Date: 20230717
Lead Channel Impedance Value: 430 Ohm
Lead Channel Impedance Value: 490 Ohm
Lead Channel Impedance Value: 560 Ohm
Lead Channel Pacing Threshold Amplitude: 0.75 V
Lead Channel Pacing Threshold Amplitude: 0.75 V
Lead Channel Pacing Threshold Amplitude: 1 V
Lead Channel Pacing Threshold Pulse Width: 0.5 ms
Lead Channel Pacing Threshold Pulse Width: 0.5 ms
Lead Channel Pacing Threshold Pulse Width: 0.5 ms
Lead Channel Sensing Intrinsic Amplitude: 10.9 mV
Lead Channel Sensing Intrinsic Amplitude: 3.1 mV
Lead Channel Setting Pacing Amplitude: 2 V
Lead Channel Setting Pacing Amplitude: 2 V
Lead Channel Setting Pacing Amplitude: 2.5 V
Lead Channel Setting Pacing Pulse Width: 0.5 ms
Lead Channel Setting Pacing Pulse Width: 0.5 ms
Lead Channel Setting Sensing Sensitivity: 2 mV
Pulse Gen Model: 3562
Pulse Gen Serial Number: 8094736

## 2024-01-26 ENCOUNTER — Ambulatory Visit: Payer: Self-pay | Admitting: Internal Medicine

## 2024-01-26 ENCOUNTER — Other Ambulatory Visit: Payer: Self-pay | Admitting: Family Medicine

## 2024-01-26 NOTE — Telephone Encounter (Signed)
 Copied from CRM [id number]. Topic: Clinical - Medication Refill >> Jan 26, 2024  9:36 AM Tiffany S wrote: Medication: doxazosin  (CARDURA ) 4 MG tablet [[id number]]  Order D 90 day supply   Has the patient contacted their pharmacy? Yes (Agent: If no, request that the patient contact the pharmacy for the refill. If patient does not wish to contact the pharmacy document the reason why and proceed with request.) (Agent: If yes, when and what did the pharmacy advise?)  This is the patient's preferred pharmacy:  [id number] - [place], [place] - [address] [place] [address] Phone: [phone] Fax: [phone]  Is this the correct pharmacy for this prescription? Yes If no, delete pharmacy and type the correct one.   Has the prescription been filled recently? Yes  Is the patient out of the medication? Yes  Has the patient been seen for an appointment in the last year OR does the patient have an upcoming appointment? Yes  Can we respond through MyChart? No  Agent: Please be advised that Rx refills may take up to 3 business days. We ask that you follow-up with your pharmacy.

## 2024-01-26 NOTE — Telephone Encounter (Signed)
 Please refer to cardiology for refill

## 2024-01-27 ENCOUNTER — Other Ambulatory Visit: Payer: Self-pay | Admitting: Family Medicine

## 2024-01-27 ENCOUNTER — Ambulatory Visit: Admitting: Family Medicine

## 2024-01-27 ENCOUNTER — Ambulatory Visit (HOSPITAL_COMMUNITY)

## 2024-01-27 NOTE — Telephone Encounter (Signed)
 Copied from CRM [id number]. Topic: Clinical - Medication Refill >> Jan 27, 2024 10:00 AM Tiffini S wrote: Medication: doxazosin  (CARDURA ) 4 MG tablet, patient spouse states that the cardiologist normally fills prescription but he has retired in June, patient have several doctors that have retired   Has the patient contacted their pharmacy? Yes (Agent: If no, request that the patient contact the pharmacy for the refill. If patient does not wish to contact the pharmacy document the reason why and proceed with request.) (Agent: If yes, when and what did the pharmacy advise?)  This is the patient's preferred pharmacy:  [id number] - [place], [place] - [address] AT [address] [place] [address] Phone: [phone] Fax: [phone]  Is this the correct pharmacy for this prescription? Yes If no, delete pharmacy and type the correct one.   Has the prescription been filled recently? Yes  Is the patient out of the medication? Yes, called the request in last week to the pharmacy/ now the patient is out of medication   Has the patient been seen for an appointment in the last year OR does the patient have an upcoming appointment? Yes  Can we respond through MyChart? No, please call the patient spouse at [phone]  Agent: Please be advised that Rx refills may take up to 3 business days. We ask that you follow-up with your pharmacy.

## 2024-01-27 NOTE — Telephone Encounter (Signed)
 The patient may send a message to the cardiology office, and they can issue a refill on behalf of the retired Animator

## 2024-01-31 ENCOUNTER — Ambulatory Visit: Payer: Self-pay | Admitting: Internal Medicine

## 2024-02-02 ENCOUNTER — Other Ambulatory Visit: Payer: Self-pay

## 2024-02-02 DIAGNOSIS — E7841 Elevated Lipoprotein(a): Secondary | ICD-10-CM

## 2024-02-02 MED ORDER — ROSUVASTATIN CALCIUM 20 MG PO TABS: 20.0000 mg | ORAL_TABLET | Freq: Every day | ORAL | 3 refills | Status: AC

## 2024-02-02 NOTE — Telephone Encounter (Signed)
 Pts wife stated she Is not gonna worry about that medication until he sees someone in person casue she called the cardiologist office and the said they would have to forward it to someone else

## 2024-02-02 NOTE — Telephone Encounter (Signed)
  Wife is returning call 

## 2024-02-03 ENCOUNTER — Other Ambulatory Visit: Payer: Self-pay

## 2024-02-03 MED ORDER — DOXAZOSIN MESYLATE 4 MG PO TABS: 4.0000 mg | ORAL_TABLET | Freq: Every day | ORAL | 1 refills | Status: DC

## 2024-02-04 ENCOUNTER — Ambulatory Visit (HOSPITAL_COMMUNITY)
Admission: RE | Admit: 2024-02-04 | Discharge: 2024-02-04 | Disposition: A | Discharge status: Home / Self Care | Source: Ambulatory Visit | Attending: Cardiovascular Disease | Admitting: Cardiovascular Disease

## 2024-02-04 DIAGNOSIS — I428 Other cardiomyopathies: Secondary | ICD-10-CM | POA: Diagnosis not present

## 2024-02-04 LAB — ECHOCARDIOGRAM COMPLETE
Area-P 1/2: 3.37 cm2
S' Lateral: 3.9 cm

## 2024-02-04 NOTE — Progress Notes (Signed)
*  PRELIMINARY RESULTS* Echocardiogram 2D Echocardiogram has been performed.  Joseph [name] 02/04/2024, 11:00 AM

## 2024-02-09 DIAGNOSIS — H01001 Unspecified blepharitis right upper eyelid: Secondary | ICD-10-CM | POA: Diagnosis not present

## 2024-02-09 DIAGNOSIS — H01005 Unspecified blepharitis left lower eyelid: Secondary | ICD-10-CM | POA: Diagnosis not present

## 2024-02-09 DIAGNOSIS — H01002 Unspecified blepharitis right lower eyelid: Secondary | ICD-10-CM | POA: Diagnosis not present

## 2024-02-09 DIAGNOSIS — H11153 Pinguecula, bilateral: Secondary | ICD-10-CM | POA: Diagnosis not present

## 2024-02-09 DIAGNOSIS — H01004 Unspecified blepharitis left upper eyelid: Secondary | ICD-10-CM | POA: Diagnosis not present

## 2024-02-09 DIAGNOSIS — H401131 Primary open-angle glaucoma, bilateral, mild stage: Secondary | ICD-10-CM | POA: Diagnosis not present

## 2024-02-09 DIAGNOSIS — H2513 Age-related nuclear cataract, bilateral: Secondary | ICD-10-CM | POA: Diagnosis not present

## 2024-02-11 ENCOUNTER — Ambulatory Visit: Payer: Self-pay

## 2024-02-11 ENCOUNTER — Other Ambulatory Visit: Payer: Self-pay | Admitting: *Deleted

## 2024-02-11 MED ORDER — DOXAZOSIN MESYLATE 4 MG PO TABS: 4.0000 mg | ORAL_TABLET | Freq: Every day | ORAL | 1 refills | Status: DC

## 2024-02-11 NOTE — Telephone Encounter (Signed)
 Refill sent to the pharmacy electronically.  Patient wife reports no dizziness or lightheadedness

## 2024-02-26 ENCOUNTER — Other Ambulatory Visit: Payer: Self-pay | Admitting: Family Medicine

## 2024-02-28 ENCOUNTER — Other Ambulatory Visit: Payer: Self-pay

## 2024-02-28 MED ORDER — SPIRONOLACTONE 25 MG PO TABS: 12.5000 mg | ORAL_TABLET | Freq: Every day | ORAL | 1 refills | Status: DC

## 2024-03-16 ENCOUNTER — Ambulatory Visit: Payer: Self-pay | Admitting: Family Medicine

## 2024-03-16 ENCOUNTER — Encounter: Payer: Self-pay | Admitting: Family Medicine

## 2024-03-16 VITALS — BP 139/81 | HR 66 | Resp 16 | Ht 69.0 in | Wt 224.0 lb

## 2024-03-16 DIAGNOSIS — E559 Vitamin D deficiency, unspecified: Secondary | ICD-10-CM | POA: Diagnosis not present

## 2024-03-16 DIAGNOSIS — E538 Deficiency of other specified B group vitamins: Secondary | ICD-10-CM

## 2024-03-16 DIAGNOSIS — R7303 Prediabetes: Secondary | ICD-10-CM | POA: Diagnosis not present

## 2024-03-16 NOTE — Patient Instructions (Addendum)
        Great to see you today.  I have refilled the medication(s) we provide.   If labs were collected, we will inform you of lab results once received either by echart message or telephone call.   - echart message- for normal results that have been seen by the patient already.   - telephone call: abnormal results or if patient has not viewed results in their echart.   - Please take medications as prescribed. - Follow up with your primary health provider if any health concerns arises. - If symptoms worsen please contact your primary care provider and/or visit the emergency department.    Your eGFR levels show that your kidney function is lower. Here are some important steps to take:  Control Your Blood Pressure: Aim to keep your blood pressure below 130/80. Continue taking your blood pressure medications daily.  Manage Your PRE- Diabetes: Focus on a healthy diet and regular exercise.  Keep Cholesterol in Check: This helps protect your blood vessels from further damage.  Pain Management: Avoid NSAIDs (like ibuprofen) and use Tylenol  instead for pain relief.  Kidney-Friendly Diet:  Eat plenty of vegetables like cauliflower, onions, eggplant, and turnips. Choose low-sodium options and limit protein to lean meats (like poultry and fish), eggs, and unsalted seafood. Avoid fatty foods and limit smoking and alcohol . Stay Active: Aim for at least 150 minutes of exercise each week.

## 2024-03-16 NOTE — Progress Notes (Signed)
   Established Patient Office Visit   Subjective  Patient ID: Joseph [name], male    DOB: 04-13-1946  Age: 78 y.o. MRN: [id number]  Chief Complaint  Patient presents with   Hypertension    Concerns about patient not wanting to take his medication in the evenings and thinks he's on too much meds     He  has a past medical history of 1st degree AV block, Atrial fibrillation (HCC), Diastolic dysfunction (4/13), Hypertension, New onset atrial fibrillation (HCC) (06/2015), RBBB, Sinus bradycardia, and Stroke (HCC).  HPI Patient presents to the clinic for chronic follow up. For the details of today's visit, please refer to assessment and plan.   Review of Systems  Constitutional:  Negative for chills and fever.  Eyes:  Negative for blurred vision.  Respiratory:  Negative for shortness of breath.   Cardiovascular:  Negative for chest pain.  Neurological:  Negative for dizziness.      Objective:     BP 139/81   Pulse 66   Resp 16   Ht 5' 9 (1.753 m)   Wt 224 lb (101.6 kg)   SpO2 96%   BMI 33.08 kg/m  BP Readings from Last 3 Encounters:  03/16/24 139/81  01/04/24 (!) 144/96  11/12/23 (!) 185/83      Physical Exam Vitals reviewed.  Constitutional:      General: He is not in acute distress.    Appearance: Normal appearance. He is not ill-appearing, toxic-appearing or diaphoretic.  HENT:     Head: Normocephalic.  Eyes:     General:        Right eye: No discharge.        Left eye: No discharge.     Conjunctiva/sclera: Conjunctivae normal.  Cardiovascular:     Rate and Rhythm: Normal rate.     Pulses: Normal pulses.     Heart sounds: Normal heart sounds.  Pulmonary:     Effort: Pulmonary effort is normal. No respiratory distress.     Breath sounds: Normal breath sounds.  Skin:    General: Skin is warm and dry.     Capillary Refill: Capillary refill takes less than 2 seconds.  Neurological:     Mental Status: He is alert.  Psychiatric:        Mood and Affect:  Mood normal.        Behavior: Behavior normal.      No results found for any visits on 03/16/24.  The ASCVD Risk score (Arnett DK, et al., 2019) failed to calculate for the following reasons:   Risk score cannot be calculated because patient has a medical history suggesting prior/existing ASCVD    Assessment & Plan:  Vitamin D  deficiency -     VITAMIN D  25 Hydroxy (Vit-D Deficiency, Fractures)  Prediabetes Assessment & Plan: Last Hemoglobin A1c: 6.2 Labs: Ordered today, results pending; will follow up accordingly. Reviewed non-pharmacological interventions, including a balanced diet rich in lean proteins, healthy fats, whole grains, and high-fiber vegetables. Emphasized reducing refined sugars and processed carbohydrates, and incorporating more fruits, leafy greens, and legumes.   Orders: -     Hemoglobin A1c  Vitamin B12 deficiency -     Vitamin B12    Return in about 4 months (around 07/16/2024), or if symptoms worsen or fail to improve, for chronic follow-up.   Hilario Kidd Wilhelmena Falter, FNP

## 2024-03-16 NOTE — Assessment & Plan Note (Signed)
 Last Hemoglobin A1c: 6.2 Labs: Ordered today, results pending; will follow up accordingly. Reviewed non-pharmacological interventions, including a balanced diet rich in lean proteins, healthy fats, whole grains, and high-fiber vegetables. Emphasized reducing refined sugars and processed carbohydrates, and incorporating more fruits, leafy greens, and legumes.

## 2024-03-17 ENCOUNTER — Ambulatory Visit: Payer: Self-pay | Admitting: Family Medicine

## 2024-03-17 LAB — HEMOGLOBIN A1C
Est. average glucose Bld gHb Est-mCnc: 126 mg/dL
Hgb A1c MFr Bld: 6 % — ABNORMAL HIGH (ref 4.8–5.6)

## 2024-03-17 LAB — VITAMIN B12: Vitamin B-12: 584 pg/mL (ref 232–1245)

## 2024-03-17 LAB — VITAMIN D 25 HYDROXY (VIT D DEFICIENCY, FRACTURES): Vit D, 25-Hydroxy: 35.1 ng/mL (ref 30.0–100.0)

## 2024-03-17 NOTE — Progress Notes (Signed)
 Please inform patient,  Hemoglobin A1c  6.0 indicates prediabetes - no medication intervention just lifestyle changes   Pre-Diabetes Diet  Eat More:  Whole grains: Oats, quinoa, brown rice. Vegetables: Leafy greens, broccoli, green beans. Fruits: Low-sugar like berries, apples. Lean proteins: Chicken, fish, beans, eggs. Healthy fats: Nuts, seeds, avocado, olive oil.  Limit:  Refined carbs: White bread, pastries. Sugary foods: Soda, candy, desserts. Fried and fatty foods.  Tips:  Eat balanced meals with portion control. Stay hydrated (water  over sugary drinks). Pair with regular exercise (e.g., walking). Example: Grilled chicken, quinoa, and steamed broccoli.  Combine diet with regular physical activity (e.g., 30 minutes of walking daily or 5 times per week.)

## 2024-04-03 ENCOUNTER — Ambulatory Visit (INDEPENDENT_AMBULATORY_CARE_PROVIDER_SITE_OTHER): Payer: Medicare PPO | Admitting: Urology

## 2024-04-03 VITALS — BP 181/95 | HR 60

## 2024-04-03 DIAGNOSIS — N401 Enlarged prostate with lower urinary tract symptoms: Secondary | ICD-10-CM | POA: Diagnosis not present

## 2024-04-03 DIAGNOSIS — N138 Other obstructive and reflux uropathy: Secondary | ICD-10-CM | POA: Diagnosis not present

## 2024-04-03 DIAGNOSIS — R351 Nocturia: Secondary | ICD-10-CM | POA: Diagnosis not present

## 2024-04-03 DIAGNOSIS — N3281 Overactive bladder: Secondary | ICD-10-CM

## 2024-04-03 LAB — URINALYSIS, ROUTINE W REFLEX MICROSCOPIC
Bilirubin, UA: NEGATIVE
Glucose, UA: NEGATIVE
Ketones, UA: NEGATIVE
Leukocytes,UA: NEGATIVE
Nitrite, UA: NEGATIVE
Protein,UA: NEGATIVE
RBC, UA: NEGATIVE
Specific Gravity, UA: 1.025 (ref 1.005–1.030)
Urobilinogen, Ur: 0.2 mg/dL (ref 0.2–1.0)
pH, UA: 6 (ref 5.0–7.5)

## 2024-04-03 MED ORDER — ALFUZOSIN HCL ER 10 MG PO TB24
10.0000 mg | ORAL_TABLET | Freq: Every morning | ORAL | 3 refills | Status: AC
Start: 2024-04-03 — End: ?

## 2024-04-03 MED ORDER — MIRABEGRON ER 50 MG PO TB24: 50.0000 mg | ORAL_TABLET | Freq: Every morning | ORAL | 3 refills | Status: AC

## 2024-04-03 NOTE — Progress Notes (Unsigned)
 04/03/2024 11:19 AM   Joseph [name] 27-Jul-1945 [id number]  Referring provider: Terry Wilhelmena Lloyd Hilario, FNP [phone] [place],  [place] [address]  Followup BPh and OAB.   HPI: [name] is a 78yo here for followup for BPH with nocturia and OAB. He is currently taking uroxatral  but is not taking his vesicare  and mirabegron . IPSS 7 QOL 2. Uirne stream is mostly strong. He denies straining to urinate. He has nocturia 2-5x. No other complaints today   PMH: Past Medical History:  Diagnosis Date   1st degree AV block    Atrial fibrillation (HCC)    Diastolic dysfunction 4/13   grade 1   Hypertension    New onset atrial fibrillation (HCC) 06/2015   RBBB    Sinus bradycardia    Stroke (HCC)    no defecits, pt denies having a stroke, says Dr Joseph diagnosed this    Surgical History: Past Surgical History:  Procedure Laterality Date   BIV PACEMAKER INSERTION CRT-P N/A 01/26/2022   Procedure: BIV PACEMAKER INSERTION CRT-P;  Surgeon: Joseph [name] ORN, MD;  Location: [facility] INVASIVE CV LAB;  Service: Cardiovascular;  Laterality: N/A;   COLONOSCOPY     COLONOSCOPY  12/30/2011   Procedure: COLONOSCOPY;  Surgeon: Joseph [name] Hollingshead, MD;  Location: [facility];  Service: Endoscopy;  Laterality: N/A;  1:45PM   COLONOSCOPY N/A 04/19/2019   Procedure: COLONOSCOPY;  Surgeon: [name] Joseph HERO, MD;  Location: [facility];  Service: Endoscopy;  Laterality: N/A;  2:00pm   EXCISION MASS LOWER EXTREMETIES Left 05/12/2018   Procedure: Left foot plantar fibroma excision;  Surgeon: Joseph Rush, MD;  Location: [facility];  Service: Orthopedics;  Laterality: Left;   FOOT SURGERY Left    LEFT HEART CATH AND CORONARY ANGIOGRAPHY N/A 01/26/2022   Procedure: LEFT HEART CATH AND CORONARY ANGIOGRAPHY;  Surgeon: Joseph Debby LABOR, MD;  Location: [facility] INVASIVE CV LAB;  Service: Cardiovascular;  Laterality: N/A;   SPERMATOCELECTOMY Right 11/13/2019   Procedure: EXCISION OF SPERMATIC CORD  CYST;  Surgeon: Joseph Belvie CROME, MD;  Location: [facility];  Service: Urology;  Laterality: Right;   VASECTOMY  11/13/2019   Procedure: VASECTOMY;  Surgeon: Joseph Belvie CROME, MD;  Location: [facility];  Service: Urology;;    Home Medications:  Allergies as of 04/03/2024   Not on File      Medication List        Accurate as of April 03, 2024 11:19 AM. If you have any questions, ask your nurse or doctor.          alfuzosin  10 MG 24 hr tablet Commonly known as: UROXATRAL  Take 1 tablet (10 mg total) by mouth at bedtime.   apixaban  5 MG Tabs tablet Commonly known as: Eliquis  Take 1 tablet (5 mg total) by mouth 2 (two) times daily.   carvedilol  12.5 MG tablet Commonly known as: COREG  TAKE 1 TABLET(12.5 MG) BY MOUTH TWICE DAILY   cholecalciferol  25 MCG (1000 UNIT) tablet Commonly known as: VITAMIN D3 Take 1 tablet (1,000 Units total) by mouth daily.   cloNIDine  0.1 MG tablet Commonly known as: Catapres  Take 1 tablet (0.1 mg total) by mouth 2 (two) times daily.   cycloSPORINE  0.05 % ophthalmic emulsion Commonly known as: RESTASIS  Place 1 drop into both eyes 2 (two) times daily.   donepezil  10 MG tablet Commonly known as: ARICEPT  Take 10 mg by mouth daily.   doxazosin  4 MG tablet Commonly known as: CARDURA  Take  1 tablet (4 mg total) by mouth at bedtime.   Entresto  24-26 MG Generic drug: sacubitril -valsartan  TAKE 1 TABLET BY MOUTH TWICE DAILY   fluticasone  50 MCG/ACT nasal spray Commonly known as: FLONASE  Place 2 sprays into both nostrils daily.   hydrochlorothiazide  25 MG tablet Commonly known as: HYDRODIURIL  TAKE 1 TABLET(25 MG) BY MOUTH DAILY   latanoprost  0.005 % ophthalmic solution Commonly known as: XALATAN  Place 1 drop into both eyes at bedtime.   levocetirizine 5 MG tablet Commonly known as: XYZAL  TAKE 1 TABLET(5 MG) BY MOUTH EVERY EVENING   Lubricant Eye Drops 0.4-0.3 % Soln Generic drug: Polyethyl Glycol-Propyl Glycol Place 1 drop into both  eyes 3 (three) times daily as needed (dry/irritated eyes.).   mirabegron  ER 50 MG Tb24 tablet Commonly known as: Myrbetriq  Take 1 tablet (50 mg total) by mouth daily.   multivitamin with minerals Tabs tablet Take 1 tablet by mouth daily.   PROSTATE HEALTH PO Take 1 capsule by mouth daily. Super Beta Prostate   rosuvastatin  20 MG tablet Commonly known as: Crestor  Take 1 tablet (20 mg total) by mouth daily.   solifenacin  5 MG tablet Commonly known as: VESICARE  Take 1 tablet (5 mg total) by mouth daily.   spironolactone  25 MG tablet Commonly known as: ALDACTONE  Take 0.5 tablets (12.5 mg total) by mouth daily.        Allergies: Not on File  Family History: Family History  Problem Relation Age of Onset   Cancer Brother    CVA Mother    Hypertension Father    Colon cancer Neg Hx     Social History:  reports that he quit smoking about 29 years ago. His smoking use included cigarettes. He has never used smokeless tobacco. He reports that he does not currently use alcohol . He reports that he does not use drugs.  ROS: All other review of systems were reviewed and are negative except what is noted above in HPI  Physical Exam: BP (!) 181/95   Pulse 60   Constitutional:  Alert and oriented, No acute distress. HEENT: [place] AT, moist mucus membranes.  Trachea midline, no masses. Cardiovascular: No clubbing, cyanosis, or edema. Respiratory: Normal respiratory effort, no increased work of breathing. GI: Abdomen is soft, nontender, nondistended, no abdominal masses GU: No CVA tenderness.  Lymph: No cervical or inguinal lymphadenopathy. Skin: No rashes, bruises or suspicious lesions. Neurologic: Grossly intact, no focal deficits, moving all 4 extremities. Psychiatric: Normal mood and affect.  Laboratory Data: Lab Results  Component Value Date   WBC 4.1 01/19/2024   HGB 12.1 (L) 01/19/2024   HCT 40.9 01/19/2024   MCV 73.3 (L) 01/19/2024   PLT 221 01/19/2024    Lab Results   Component Value Date   CREATININE 1.59 (H) 01/19/2024    No results found for: PSA  No results found for: TESTOSTERONE  Lab Results  Component Value Date   HGBA1C 6.0 (H) 03/16/2024    Urinalysis    Component Value Date/Time   COLORURINE YELLOW 04/22/2023 1732   APPEARANCEUR CLEAR 04/22/2023 1732   APPEARANCEUR Clear 04/06/2022 0956   LABSPEC 1.017 04/22/2023 1732   PHURINE 5.0 04/22/2023 1732   GLUCOSEU NEGATIVE 04/22/2023 1732   HGBUR NEGATIVE 04/22/2023 1732   BILIRUBINUR NEGATIVE 04/22/2023 1732   BILIRUBINUR Negative 04/06/2022 0956   KETONESUR NEGATIVE 04/22/2023 1732   PROTEINUR NEGATIVE 04/22/2023 1732   NITRITE NEGATIVE 04/22/2023 1732   LEUKOCYTESUR NEGATIVE 04/22/2023 1732    Lab Results  Component Value Date  LABMICR 20.1 02/25/2023   WBCUA None seen 09/23/2021   LABEPIT None seen 09/23/2021   BACTERIA None seen 09/23/2021    Pertinent Imaging:  No results found for this or any previous visit.  No results found for this or any previous visit.  No results found for this or any previous visit.  No results found for this or any previous visit.  Results for orders placed during the hospital encounter of 05/26/23  US  RENAL  Narrative : PROCEDURE: US  RENAL  HISTORY: Patient is a 78 y/o  M with 3a ckd.  COMPARISON: U/S renal 04/08/2023, CTA abdomen/pelvis 01/03/2021, MR abdomen 08/16/2012.  TECHNIQUE: Two-dimensional grayscale and color Doppler ultrasound of the kidneys and bladder was performed.  FINDINGS: The bladder demonstrates a normal anechoic echogenicity without wall thickening. Prevoid bladder volume is 178 mL. The urinary bladder is empty postvoid. The left ureteral jet is faintly visualized, right is not seen.  The right kidney measures 10.9 cm in length. Renal cortical echotexture is mildly increased. There is no hydronephrosis. There are no stones. There is a 3.6 cm anechoic cyst within the upper pole. A 3.5 x 2.3 x  2.4 cm cyst with a single septation versus two adjacent cysts is visualized within the inferior pole.  The left kidney measures 11.4 cm in length. Renal cortical echotexture is mildly increased. There is no hydronephrosis. There are no stones. There are multiple cysts, largest measuring 6.5 cm. A 5.0 x 2.6 x 3.6 cyst with a single septation versus two adjacent cysts is visualized within the superior pole.  IMPRESSION: 1. Increased echogenicity of the bilateral renal cortices and multiple cysts, consistent with medical renal disease.  2. Cyst with a single septation versus two adjacent cysts visualized within the right inferior and left superior poles. Multiphase CT of the abdomen is recommended further evaluation.  3. Faint visualization of the left ureteral jet. Right ureteral jet is not visualized.  Thank you for allowing us  to assist in the care of this patient.   Electronically Signed By: Lynwood Mains M.D. On: 06/07/2023 12:25  No results found for this or any previous visit.  No results found for this or any previous visit.  No results found for this or any previous visit.   Assessment & Plan:    1. Benign prostatic hyperplasia with urinary obstruction (Primary) Uroxatral  10mg  in AM - Urinalysis, Routine w reflex microscopic  2. OAB (overactive bladder) -mirabegron  25mg  in Am and stop vesicare   3. Nocturia Uroxatral  10mg  in the morning   No follow-ups on file.  Belvie Clara, MD  [facility] [place]

## 2024-04-04 ENCOUNTER — Encounter: Payer: Self-pay | Admitting: Urology

## 2024-04-04 NOTE — Patient Instructions (Signed)

## 2024-04-05 ENCOUNTER — Ambulatory Visit: Admitting: Urology

## 2024-04-06 ENCOUNTER — Encounter (HOSPITAL_COMMUNITY): Payer: Self-pay

## 2024-04-06 ENCOUNTER — Emergency Department (HOSPITAL_COMMUNITY)

## 2024-04-06 ENCOUNTER — Other Ambulatory Visit: Payer: Self-pay

## 2024-04-06 ENCOUNTER — Observation Stay (HOSPITAL_COMMUNITY)
Admission: EM | Admit: 2024-04-06 | Discharge: 2024-04-13 | Disposition: A | Discharge status: Skilled Nursing Facility | Attending: Internal Medicine | Admitting: Internal Medicine

## 2024-04-06 ENCOUNTER — Observation Stay (HOSPITAL_COMMUNITY)

## 2024-04-06 DIAGNOSIS — R718 Other abnormality of red blood cells: Secondary | ICD-10-CM | POA: Diagnosis not present

## 2024-04-06 DIAGNOSIS — I130 Hypertensive heart and chronic kidney disease with heart failure and stage 1 through stage 4 chronic kidney disease, or unspecified chronic kidney disease: Secondary | ICD-10-CM | POA: Insufficient documentation

## 2024-04-06 DIAGNOSIS — I480 Paroxysmal atrial fibrillation: Secondary | ICD-10-CM | POA: Diagnosis not present

## 2024-04-06 DIAGNOSIS — R29818 Other symptoms and signs involving the nervous system: Secondary | ICD-10-CM | POA: Diagnosis not present

## 2024-04-06 DIAGNOSIS — N179 Acute kidney failure, unspecified: Secondary | ICD-10-CM | POA: Insufficient documentation

## 2024-04-06 DIAGNOSIS — I5032 Chronic diastolic (congestive) heart failure: Secondary | ICD-10-CM | POA: Insufficient documentation

## 2024-04-06 DIAGNOSIS — N1831 Chronic kidney disease, stage 3a: Secondary | ICD-10-CM | POA: Diagnosis not present

## 2024-04-06 DIAGNOSIS — E875 Hyperkalemia: Secondary | ICD-10-CM | POA: Diagnosis not present

## 2024-04-06 DIAGNOSIS — E211 Secondary hyperparathyroidism, not elsewhere classified: Secondary | ICD-10-CM | POA: Diagnosis not present

## 2024-04-06 DIAGNOSIS — I428 Other cardiomyopathies: Secondary | ICD-10-CM | POA: Diagnosis not present

## 2024-04-06 DIAGNOSIS — R4781 Slurred speech: Secondary | ICD-10-CM | POA: Diagnosis not present

## 2024-04-06 DIAGNOSIS — R001 Bradycardia, unspecified: Secondary | ICD-10-CM | POA: Diagnosis not present

## 2024-04-06 DIAGNOSIS — R404 Transient alteration of awareness: Secondary | ICD-10-CM | POA: Diagnosis not present

## 2024-04-06 DIAGNOSIS — R29810 Facial weakness: Secondary | ICD-10-CM | POA: Diagnosis present

## 2024-04-06 DIAGNOSIS — N189 Chronic kidney disease, unspecified: Secondary | ICD-10-CM | POA: Diagnosis not present

## 2024-04-06 DIAGNOSIS — D631 Anemia in chronic kidney disease: Secondary | ICD-10-CM | POA: Diagnosis not present

## 2024-04-06 DIAGNOSIS — I671 Cerebral aneurysm, nonruptured: Secondary | ICD-10-CM | POA: Diagnosis not present

## 2024-04-06 DIAGNOSIS — I6782 Cerebral ischemia: Secondary | ICD-10-CM | POA: Diagnosis not present

## 2024-04-06 DIAGNOSIS — Z950 Presence of cardiac pacemaker: Secondary | ICD-10-CM | POA: Diagnosis not present

## 2024-04-06 DIAGNOSIS — Z8673 Personal history of transient ischemic attack (TIA), and cerebral infarction without residual deficits: Secondary | ICD-10-CM | POA: Insufficient documentation

## 2024-04-06 DIAGNOSIS — Z79899 Other long term (current) drug therapy: Secondary | ICD-10-CM | POA: Diagnosis not present

## 2024-04-06 DIAGNOSIS — R55 Syncope and collapse: Principal | ICD-10-CM | POA: Insufficient documentation

## 2024-04-06 DIAGNOSIS — R9082 White matter disease, unspecified: Secondary | ICD-10-CM | POA: Diagnosis not present

## 2024-04-06 DIAGNOSIS — Z87891 Personal history of nicotine dependence: Secondary | ICD-10-CM | POA: Insufficient documentation

## 2024-04-06 DIAGNOSIS — R531 Weakness: Secondary | ICD-10-CM | POA: Diagnosis not present

## 2024-04-06 DIAGNOSIS — R61 Generalized hyperhidrosis: Secondary | ICD-10-CM | POA: Diagnosis not present

## 2024-04-06 DIAGNOSIS — R809 Proteinuria, unspecified: Secondary | ICD-10-CM | POA: Diagnosis not present

## 2024-04-06 DIAGNOSIS — R4182 Altered mental status, unspecified: Secondary | ICD-10-CM | POA: Diagnosis not present

## 2024-04-06 DIAGNOSIS — G459 Transient cerebral ischemic attack, unspecified: Secondary | ICD-10-CM | POA: Diagnosis not present

## 2024-04-06 DIAGNOSIS — F0390 Unspecified dementia without behavioral disturbance: Secondary | ICD-10-CM | POA: Insufficient documentation

## 2024-04-06 DIAGNOSIS — I959 Hypotension, unspecified: Secondary | ICD-10-CM | POA: Diagnosis not present

## 2024-04-06 DIAGNOSIS — Z7901 Long term (current) use of anticoagulants: Secondary | ICD-10-CM | POA: Diagnosis not present

## 2024-04-06 DIAGNOSIS — I10 Essential (primary) hypertension: Secondary | ICD-10-CM | POA: Diagnosis not present

## 2024-04-06 DIAGNOSIS — I6521 Occlusion and stenosis of right carotid artery: Secondary | ICD-10-CM | POA: Diagnosis not present

## 2024-04-06 LAB — DIFFERENTIAL
Abs Immature Granulocytes: 0.01 K/uL (ref 0.00–0.07)
Basophils Absolute: 0 K/uL (ref 0.0–0.1)
Basophils Relative: 1 %
Eosinophils Absolute: 0.1 K/uL (ref 0.0–0.5)
Eosinophils Relative: 2 %
Immature Granulocytes: 0 %
Lymphocytes Relative: 34 %
Lymphs Abs: 1.3 K/uL (ref 0.7–4.0)
Monocytes Absolute: 0.3 K/uL (ref 0.1–1.0)
Monocytes Relative: 8 %
Neutro Abs: 2.1 K/uL (ref 1.7–7.7)
Neutrophils Relative %: 55 %

## 2024-04-06 LAB — CBC
HCT: 41.6 % (ref 39.0–52.0)
Hemoglobin: 12.8 g/dL — ABNORMAL LOW (ref 13.0–17.0)
MCH: 21.8 pg — ABNORMAL LOW (ref 26.0–34.0)
MCHC: 30.8 g/dL (ref 30.0–36.0)
MCV: 70.9 fL — ABNORMAL LOW (ref 80.0–100.0)
Platelets: 113 K/uL — ABNORMAL LOW (ref 150–400)
RBC: 5.87 MIL/uL — ABNORMAL HIGH (ref 4.22–5.81)
RDW: 15.6 % — ABNORMAL HIGH (ref 11.5–15.5)
WBC: 3.8 K/uL — ABNORMAL LOW (ref 4.0–10.5)
nRBC: 0 % (ref 0.0–0.2)

## 2024-04-06 LAB — COMPREHENSIVE METABOLIC PANEL WITH GFR
ALT: 15 U/L (ref 0–44)
AST: 23 U/L (ref 15–41)
Albumin: 3.6 g/dL (ref 3.5–5.0)
Alkaline Phosphatase: 61 U/L (ref 38–126)
Anion gap: 11 (ref 5–15)
BUN: 22 mg/dL (ref 8–23)
CO2: 25 mmol/L (ref 22–32)
Calcium: 9 mg/dL (ref 8.9–10.3)
Chloride: 101 mmol/L (ref 98–111)
Creatinine, Ser: 1.58 mg/dL — ABNORMAL HIGH (ref 0.61–1.24)
GFR, Estimated: 45 mL/min — ABNORMAL LOW (ref >60)
Glucose, Bld: 102 mg/dL — ABNORMAL HIGH (ref 70–99)
Potassium: 4.7 mmol/L (ref 3.5–5.1)
Sodium: 137 mmol/L (ref 135–145)
Total Bilirubin: 1.5 mg/dL — ABNORMAL HIGH (ref 0.0–1.2)
Total Protein: 6.7 g/dL (ref 6.5–8.1)

## 2024-04-06 LAB — ETHANOL: Alcohol, Ethyl (B): <15 mg/dL (ref <15)

## 2024-04-06 LAB — I-STAT CHEM 8, ED
BUN: 33 mg/dL — ABNORMAL HIGH (ref 8–23)
Calcium, Ion: 1.12 mmol/L — ABNORMAL LOW (ref 1.15–1.40)
Chloride: 105 mmol/L (ref 98–111)
Creatinine, Ser: 1.9 mg/dL — ABNORMAL HIGH (ref 0.61–1.24)
Glucose, Bld: 113 mg/dL — ABNORMAL HIGH (ref 70–99)
HCT: 42 % (ref 39.0–52.0)
Hemoglobin: 14.3 g/dL (ref 13.0–17.0)
Potassium: 5.7 mmol/L — ABNORMAL HIGH (ref 3.5–5.1)
Sodium: 139 mmol/L (ref 135–145)
TCO2: 27 mmol/L (ref 22–32)

## 2024-04-06 LAB — PROTIME-INR
INR: 1.1 (ref 0.8–1.2)
Prothrombin Time: 14.6 s (ref 11.4–15.2)

## 2024-04-06 LAB — CBG MONITORING, ED: Glucose-Capillary: 118 mg/dL — ABNORMAL HIGH (ref 70–99)

## 2024-04-06 LAB — APTT: aPTT: 22 s — ABNORMAL LOW (ref 24–36)

## 2024-04-06 MED ORDER — MIRABEGRON ER 50 MG PO TB24
50.0000 mg | ORAL_TABLET | Freq: Every morning | ORAL | Status: DC
  Administered 2024-04-07 – 2024-04-12 (×6): 50 mg via ORAL
  Filled 2024-04-06 (×7): qty 1

## 2024-04-06 MED ORDER — IOHEXOL 350 MG/ML SOLN
75.0000 mL | Freq: Once | INTRAVENOUS | Status: AC | PRN
  Administered 2024-04-06: 75 mL via INTRAVENOUS

## 2024-04-06 MED ORDER — ACETAMINOPHEN 160 MG/5ML PO SOLN: 650.0000 mg | ORAL | Status: DC | PRN

## 2024-04-06 MED ORDER — LATANOPROST 0.005 % OP SOLN
1.0000 [drp] | Freq: Every day | OPHTHALMIC | Status: DC
  Administered 2024-04-06 – 2024-04-12 (×7): 1 [drp] via OPHTHALMIC
  Filled 2024-04-06: qty 2.5

## 2024-04-06 MED ORDER — POLYVINYL ALCOHOL 1.4 % OP SOLN: 1.0000 [drp] | Freq: Three times a day (TID) | OPHTHALMIC | Status: DC | PRN

## 2024-04-06 MED ORDER — SODIUM CHLORIDE 0.9% FLUSH: 3.0000 mL | Freq: Once | INTRAVENOUS | Status: DC

## 2024-04-06 MED ORDER — APIXABAN 5 MG PO TABS
5.0000 mg | ORAL_TABLET | Freq: Two times a day (BID) | ORAL | Status: DC
Start: 2024-04-06 — End: 2024-04-13
  Administered 2024-04-06 – 2024-04-13 (×14): 5 mg via ORAL
  Filled 2024-04-06: qty 1
  Filled 2024-04-06: qty 2
  Filled 2024-04-06 (×12): qty 1

## 2024-04-06 MED ORDER — SODIUM CHLORIDE 0.9 % IV SOLN: INTRAVENOUS | Status: DC

## 2024-04-06 MED ORDER — DONEPEZIL HCL 10 MG PO TABS
10.0000 mg | ORAL_TABLET | Freq: Every day | ORAL | Status: DC
  Administered 2024-04-06 – 2024-04-13 (×8): 10 mg via ORAL
  Filled 2024-04-06 (×8): qty 1

## 2024-04-06 MED ORDER — ACETAMINOPHEN 325 MG PO TABS: 650.0000 mg | ORAL_TABLET | ORAL | Status: DC | PRN

## 2024-04-06 MED ORDER — ACETAMINOPHEN 650 MG RE SUPP: 650.0000 mg | RECTAL | Status: DC | PRN

## 2024-04-06 MED ORDER — CYCLOSPORINE 0.05 % OP EMUL
1.0000 [drp] | Freq: Two times a day (BID) | OPHTHALMIC | Status: DC
Start: 2024-04-06 — End: 2024-04-13
  Administered 2024-04-06 – 2024-04-13 (×13): 1 [drp] via OPHTHALMIC
  Filled 2024-04-06 (×15): qty 30

## 2024-04-06 MED ORDER — STROKE: EARLY STAGES OF RECOVERY BOOK
Freq: Once | Status: AC
Start: 2024-04-07 — End: 2024-04-07
  Filled 2024-04-06: qty 1

## 2024-04-06 MED ORDER — CARVEDILOL 3.125 MG PO TABS
3.1250 mg | ORAL_TABLET | Freq: Two times a day (BID) | ORAL | Status: DC
Start: 2024-04-07 — End: 2024-04-13
  Administered 2024-04-07 – 2024-04-13 (×13): 3.125 mg via ORAL
  Filled 2024-04-06 (×13): qty 1

## 2024-04-06 MED ORDER — ROSUVASTATIN CALCIUM 20 MG PO TABS
20.0000 mg | ORAL_TABLET | Freq: Every day | ORAL | Status: DC
  Administered 2024-04-06 – 2024-04-13 (×8): 20 mg via ORAL
  Filled 2024-04-06 (×8): qty 1

## 2024-04-06 MED ORDER — SENNOSIDES-DOCUSATE SODIUM 8.6-50 MG PO TABS: 1.0000 | ORAL_TABLET | Freq: Every evening | ORAL | Status: DC | PRN

## 2024-04-06 MED ORDER — ALFUZOSIN HCL ER 10 MG PO TB24
10.0000 mg | ORAL_TABLET | Freq: Every morning | ORAL | Status: DC
  Administered 2024-04-07 – 2024-04-13 (×7): 10 mg via ORAL
  Filled 2024-04-06 (×7): qty 1

## 2024-04-06 NOTE — ED Notes (Signed)
 Wife states she has to go home to take her medicines and will be back in the morning

## 2024-04-06 NOTE — CV Procedure (Signed)
  Device system confirmed to be MRI conditional, with implant date > 6 weeks ago, and no evidence of abandoned or epicardial leads in review of most recent CXR  Device last cleared by EP Provider: Prentice Passey 04/06/24  Clearance is good through for 1 year as long as parameters remain stable at time of check. If pt undergoes a cardiac device procedure during that time, they should be re-cleared.   Tachy-therapies to be programmed off if applicable with device back to pre-MRI settings after completion of exam.  [employer] will be present for programming for the MRI.   Rocky Catalan, RT  04/06/2024 3:52 PM

## 2024-04-06 NOTE — ED Notes (Signed)
Transporter called to transport pt to floor. 

## 2024-04-06 NOTE — Code Documentation (Signed)
 Stroke Response Nurse Documentation Code Documentation  Joseph [name] is a 78 y.o. male arriving to [facility]  via [facility] on 04/06/2024 with past medical hx of Pacer, AF on Eliquis . On Eliquis  (apixaban ) daily. Code stroke was activated by EMS.   Patient from [employer] where he was LKW at 1010 and now complaining of Rt weakness, droop and AMS.   Stroke team at the bedside on patient arrival. Labs drawn and patient cleared for CT by Dr. Earlean. Patient to CT with team. NIHSS 5, see documentation for details and code stroke times. Patient with disoriented, right facial droop, right decreased sensation, and Expressive aphasia  on exam. The following imaging was completed:  CT Head and CTA. Patient is not a candidate for IV Thrombolytic due to use of Eliquis . Patient is not a candidate for IR due to no LVO on imaging..   Care Plan: Q 2 NIHSS and VS for 12 hr, then q 4. NPO till [facility] swallow screen obtained. .   Process Delays Noted: Difficult IV start  Bedside handoff with ED RN Cat.    Elieser Tetrick Livengood  Stroke Response RN

## 2024-04-06 NOTE — ED Notes (Addendum)
 Unable to get IV for CT contrast scan. Charge Rn made aware. Neurologist made aware of inability to get IV or US  Iv from ED ER and that IV team has been consulted. Instructions from Neurologist to have a ER physician come do the IV. Charge RN made aware to need for ED physician to do an US  IV. Charge RN to get a physician to come to CT scan.   Dr. Francesca arrived in Ct to check on the patient. Made aware that patient needs an IV. Dr. Francesca attempting US  IV in CT.

## 2024-04-06 NOTE — ED Notes (Signed)
 Unable to get an IV. Charge Rn made aware and to check with ED staff to see if we can get an US .

## 2024-04-06 NOTE — H&P (Addendum)
 History and Physical    Joseph [name] FMW:[id number] DOB: 12/26/1945 DOA: 04/06/2024  Referring MD/NP/PA: EDP PCP:  Patient coming from: Lab  Chief Complaint: passed out  HPI: Joseph [name] is a 78/M with history of prior CVA, dementia, paroxysmal A-fib on Eliquis , diastolic CHF was getting routine lab drawn today, his wife was in the waiting room when she was called in, patient had a syncopal event with loss of consciousness, there after some facial droop and abnormal speech was noted as well which gradually resolved as he regained consciousness.  Subsequently brought to the ED as a code stroke, ED Course: Hypertensive, labs notable for potassium of 5.7, creatinine 1.9  Review of Systems: As per HPI otherwise 14 point review of systems negative.   Past Medical History:  Diagnosis Date   1st degree AV block    Atrial fibrillation (HCC)    Diastolic dysfunction 4/13   grade 1   Hypertension    New onset atrial fibrillation (HCC) 06/2015   RBBB    Sinus bradycardia    Stroke (HCC)    no defecits, pt denies having a stroke, says Dr Burnard diagnosed this    Past Surgical History:  Procedure Laterality Date   BIV PACEMAKER INSERTION CRT-P N/A 01/26/2022   Procedure: BIV PACEMAKER INSERTION CRT-P;  Surgeon: Waddell Danelle ORN, MD;  Location: [facility] INVASIVE CV LAB;  Service: Cardiovascular;  Laterality: N/A;   COLONOSCOPY     COLONOSCOPY  12/30/2011   Procedure: COLONOSCOPY;  Surgeon: Lamar CHRISTELLA Hollingshead, MD;  Location: [facility];  Service: Endoscopy;  Laterality: N/A;  1:45PM   COLONOSCOPY N/A 04/19/2019   Procedure: COLONOSCOPY;  Surgeon: Hollingshead Lamar CHRISTELLA, MD;  Location: [facility];  Service: Endoscopy;  Laterality: N/A;  2:00pm   EXCISION MASS LOWER EXTREMETIES Left 05/12/2018   Procedure: Left foot plantar fibroma excision;  Surgeon: Kit Rush, MD;  Location: [facility];  Service: Orthopedics;  Laterality: Left;   FOOT SURGERY Left    LEFT HEART CATH AND CORONARY  ANGIOGRAPHY N/A 01/26/2022   Procedure: LEFT HEART CATH AND CORONARY ANGIOGRAPHY;  Surgeon: Burnard Debby LABOR, MD;  Location: [facility] INVASIVE CV LAB;  Service: Cardiovascular;  Laterality: N/A;   SPERMATOCELECTOMY Right 11/13/2019   Procedure: EXCISION OF SPERMATIC CORD CYST;  Surgeon: Sherrilee Belvie CROME, MD;  Location: [facility];  Service: Urology;  Laterality: Right;   VASECTOMY  11/13/2019   Procedure: VASECTOMY;  Surgeon: Sherrilee Belvie CROME, MD;  Location: [facility];  Service: Urology;;     reports that he quit smoking about 29 years ago. His smoking use included cigarettes. He has never used smokeless tobacco. He reports that he does not currently use alcohol . He reports that he does not use drugs.  No Known Allergies  Family History  Problem Relation Age of Onset   Cancer Brother    CVA Mother    Hypertension Father    Colon cancer Neg Hx      Prior to Admission medications   Medication Sig Start Date End Date Taking? Authorizing Provider  alfuzosin  (UROXATRAL ) 10 MG 24 hr tablet Take 1 tablet (10 mg total) by mouth every morning. 04/03/24   McKenzie, Belvie CROME, MD  apixaban  (ELIQUIS ) 5 MG TABS tablet Take 1 tablet (5 mg total) by mouth 2 (two) times daily. 08/18/23   Waddell Danelle ORN, MD  carvedilol  (COREG ) 12.5 MG tablet TAKE 1 TABLET(12.5 MG) BY MOUTH TWICE DAILY 04/05/23   Burnard Debby LABOR, MD  cholecalciferol  (VITAMIN D3) 25 MCG (1000 UNIT) tablet Take 1 tablet (1,000 Units total) by mouth daily. 10/28/23   Del Orbe Polanco, Iliana, FNP  cloNIDine  (CATAPRES ) 0.1 MG tablet Take 1 tablet (0.1 mg total) by mouth 2 (two) times daily. 10/28/23 04/06/24  Del Orbe Polanco, Iliana, FNP  cycloSPORINE  (RESTASIS ) 0.05 % ophthalmic emulsion Place 1 drop into both eyes 2 (two) times daily. 10/28/23   Del Orbe Polanco, Iliana, FNP  donepezil  (ARICEPT ) 10 MG tablet Take 10 mg by mouth daily. 04/04/23   [provider]  doxazosin  (CARDURA ) 4 MG tablet Take 1 tablet (4 mg total) by mouth at bedtime. 02/11/24    Mallipeddi, Vishnu P, MD  fluticasone  (FLONASE ) 50 MCG/ACT nasal spray Place 2 sprays into both nostrils daily. 08/16/23   Soldatova, Liuba, MD  hydrochlorothiazide  (HYDRODIURIL ) 25 MG tablet TAKE 1 TABLET(25 MG) BY MOUTH DAILY 02/28/24   Del Wilhelmena Falter, Hilario, FNP  latanoprost  (XALATAN ) 0.005 % ophthalmic solution Place 1 drop into both eyes at bedtime. 02/04/22   Pearlean Manus, MD  levocetirizine (XYZAL ) 5 MG tablet TAKE 1 TABLET(5 MG) BY MOUTH EVERY EVENING 11/02/23   Del Orbe Polanco, Iliana, FNP  mirabegron  ER (MYRBETRIQ ) 50 MG TB24 tablet Take 1 tablet (50 mg total) by mouth every morning. 04/03/24   McKenzie, Belvie CROME, MD  Misc Natural Products (PROSTATE HEALTH PO) Take 1 capsule by mouth daily. Super Beta Prostate    [provider]  Multiple Vitamin (MULTIVITAMIN WITH MINERALS) TABS tablet Take 1 tablet by mouth daily.    [provider]  Polyethyl Glycol-Propyl Glycol (LUBRICANT EYE DROPS) 0.4-0.3 % SOLN Place 1 drop into both eyes 3 (three) times daily as needed (dry/irritated eyes.).    [provider]  rosuvastatin  (CRESTOR ) 20 MG tablet Take 1 tablet (20 mg total) by mouth daily. 02/02/24   Thukkani, Arun K, MD  sacubitril -valsartan  (ENTRESTO ) 24-26 MG TAKE 1 TABLET BY MOUTH TWICE DAILY 04/05/23   Burnard Debby LABOR, MD  spironolactone  (ALDACTONE ) 25 MG tablet Take 0.5 tablets (12.5 mg total) by mouth daily. 02/28/24   Lelon Hamilton T, PA-C    Physical Exam: Vitals:   04/06/24 1230 04/06/24 1300 04/06/24 1330 04/06/24 1400  BP: 138/85 139/79 (!) 148/88 (!) 162/82  Pulse:    (!) 18  Resp: (!) 21 12 (!) 24 19  Temp:      TempSrc:      SpO2:    100%  Weight:          Constitutional: NAD, calm, comfortable HEENT:no JVD Respiratory: clear to auscultation bilaterally Cardiovascular: S1S2/RRR Abdomen: soft, non tender, Bowel sounds positive.  Musculoskeletal: No joint deformity upper and lower extremities. Ext: no edema Skin: no rashes Neurologic: CN  2-12 grossly intact. Sensation intact, DTR normal. Strength 5/5 in all 4.  Psychiatric: Normal judgment and insight. Alert and oriented x 3. Normal mood.   Labs on Admission: I have personally reviewed following labs and imaging studies  CBC: Recent Labs  Lab 04/06/24 1159 04/06/24 1207  WBC 3.8*  --   NEUTROABS 2.1  --   HGB 12.8* 14.3  HCT 41.6 42.0  MCV 70.9*  --   PLT 113*  --    Basic Metabolic Panel: Recent Labs  Lab 04/06/24 1207  NA 139  K 5.7*  CL 105  GLUCOSE 113*  BUN 33*  CREATININE 1.90*   GFR: Estimated Creatinine Clearance: 39.3 mL/min (A) (by C-G formula based on SCr of 1.9 mg/dL (H)). Liver Function Tests:  No results for input(s): AST, ALT, ALKPHOS, BILITOT, PROT, ALBUMIN in the last 168 hours. No results for input(s): LIPASE, AMYLASE in the last 168 hours. No results for input(s): AMMONIA in the last 168 hours. Coagulation Profile: Recent Labs  Lab 04/06/24 1159  INR 1.1   Cardiac Enzymes: No results for input(s): CKTOTAL, CKMB, CKMBINDEX, TROPONINI in the last 168 hours. BNP (last 3 results) No results for input(s): PROBNP in the last 8760 hours. HbA1C: No results for input(s): HGBA1C in the last 72 hours. CBG: Recent Labs  Lab 04/06/24 1118  GLUCAP 118*   Lipid Profile: No results for input(s): CHOL, HDL, LDLCALC, TRIG, CHOLHDL, LDLDIRECT in the last 72 hours. Thyroid  Function Tests: No results for input(s): TSH, T4TOTAL, FREET4, T3FREE, THYROIDAB in the last 72 hours. Anemia Panel: No results for input(s): VITAMINB12, FOLATE, FERRITIN, TIBC, IRON, RETICCTPCT in the last 72 hours. Urine analysis:    Component Value Date/Time   COLORURINE YELLOW 04/22/2023 1732   APPEARANCEUR Clear 04/03/2024 1053   LABSPEC 1.017 04/22/2023 1732   PHURINE 5.0 04/22/2023 1732   GLUCOSEU Negative 04/03/2024 1053   HGBUR NEGATIVE 04/22/2023 1732   BILIRUBINUR Negative 04/03/2024 1053    KETONESUR NEGATIVE 04/22/2023 1732   PROTEINUR Negative 04/03/2024 1053   PROTEINUR NEGATIVE 04/22/2023 1732   NITRITE Negative 04/03/2024 1053   NITRITE NEGATIVE 04/22/2023 1732   LEUKOCYTESUR Negative 04/03/2024 1053   LEUKOCYTESUR NEGATIVE 04/22/2023 1732   Sepsis Labs: @LABRCNTIP (procalcitonin:4,lacticidven:4) )No results found for this or any previous visit (from the past 240 hours).   Radiological Exams on Admission: CT ANGIO HEAD NECK W WO CM (CODE STROKE) Result Date: 04/06/2024 CLINICAL DATA:  Neuro deficit, acute, stroke suspected. Right-sided weakness and facial droop. Altered mental status. EXAM: CT ANGIOGRAPHY HEAD AND NECK WITH AND WITHOUT CONTRAST TECHNIQUE: Multidetector CT imaging of the head and neck was performed using the standard protocol during bolus administration of intravenous contrast. Multiplanar CT image reconstructions and MIPs were obtained to evaluate the vascular anatomy. Carotid stenosis measurements (when applicable) are obtained utilizing NASCET criteria, using the distal internal carotid diameter as the denominator. RADIATION DOSE REDUCTION: This exam was performed according to the departmental dose-optimization program which includes automated exposure control, adjustment of the mA and/or kV according to patient size and/or use of iterative reconstruction technique. CONTRAST:  75mL OMNIPAQUE  IOHEXOL  350 MG/ML SOLN COMPARISON:  None Available. FINDINGS: CTA NECK FINDINGS Aortic arch: Normal variant aortic arch branching pattern with common origin of the brachiocephalic and left common carotid arteries. Mild atherosclerotic calcification in the aortic arch and proximal subclavian arteries. Widely patent brachiocephalic and subclavian arteries. Right carotid system: Patent without evidence of stenosis or dissection. Left carotid system: Patent without evidence of stenosis or dissection. Small amount of nonstenotic calcified plaque in the proximal ICA. Vertebral  arteries: Patent with the right being mildly dominant. Minimal atherosclerosis at the vertebral origins. No evidence of a significant stenosis or dissection. Skeleton: No acute osseous abnormality or suspicious lesion. Other neck: Asymmetrically enlarged and heterogeneous left thyroid  lobe with a potential 4 cm nodule centered inferiorly with assessment limited by shoulder artifact. No evidence of cervical lymphadenopathy. Upper chest: Clear lung apices. Review of the MIP images confirms the above findings CTA HEAD FINDINGS Anterior circulation: The internal carotid arteries are patent from skull base to carotid termini with calcified plaque resulting in mild stenosis on the right at the level of the anterior genu. An aneurysm projecting posteriorly and inferiorly from the right supraclinoid ICA measures 6 x 3  mm. ACAs and MCAs are patent without evidence of a proximal branch occlusion or significant proximal stenosis. Posterior circulation: The intracranial vertebral arteries are widely patent to the basilar. Patent PICA, AICA, and SCA origins are visualized bilaterally. The basilar artery is widely patent. There are small left and diminutive or absent/occluded right posterior communicating arteries. Both PCAs are patent without evidence of a significant proximal stenosis. No aneurysm is identified. Venous sinuses: As permitted by contrast timing, patent. Anatomic variants: None. Review of the MIP images confirms the above findings The finding of no large vessel occlusion was communicated to Dr. Michaela at 12:14 pm on 04/06/2024 by text page via the [facility] messaging system. IMPRESSION: 1. No large vessel occlusion. 2. Mild intracranial right ICA stenosis. 3. 6 mm right supraclinoid ICA aneurysm. 4. Widely patent cervical carotid and vertebral arteries. 5. Possible 4 cm left thyroid  nodule. Recommend non-emergent thyroid  ultrasound. Reference: J Am Coll Radiol. 2015 Feb;12(2): 143-50 6.  Aortic Atherosclerosis  (ICD10-I70.0). Electronically Signed   By: Dasie Hamburg M.D.   On: 04/06/2024 12:25    EKG: Independently reviewed. Paced rhythm  Assessment/Plan     Syncope and collapse ?  Transient facial droop -Appears to be a syncopal episode, possibly vasovagal however considering his risk factors for CVA will admit to telemetry -Check orthostatics -Neurology consulting, follow-up MRI brain -Hold Entresto , clonidine    Paroxysmal atrial fibrillation  - Poor compliance with Eliquis  at baseline, only takes a.m. 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

## 2024-04-06 NOTE — ED Provider Notes (Signed)
 [place] EMERGENCY DEPARTMENT AT [facility] Provider Note  CSN: [id number] Arrival date & time: 04/06/24 1117  Chief Complaint(s) Code Stroke  HPI Joseph [name] is a 78 y.o. male history of prior stroke, atrial fibrillation on Eliquis , nonischemic cardiomyopathy, dementia presenting to the emergency department with episode of altered mental status.  Patient was getting blood drawn for routine blood testing, seem to have a near syncopal episode and afterwards with some right sided weakness and right facial droop, abnormal speech.  Paramedics were called.  Last known normal was around 10:10 AM.  He was activated as a code stroke.  Patient denies any headaches, chest pain, shortness of breath or other complaints.   Past Medical History Past Medical History:  Diagnosis Date   1st degree AV block    Atrial fibrillation (HCC)    Diastolic dysfunction 4/13   grade 1   Hypertension    New onset atrial fibrillation (HCC) 06/2015   RBBB    Sinus bradycardia    Stroke (HCC)    no defecits, pt denies having a stroke, says Dr Burnard diagnosed this   Patient Active Problem List   Diagnosis Date Noted   Chronic dryness of both eyes 10/28/2023   Chronic hoarseness 06/29/2023   Prediabetes 06/29/2023   AKI (acute kidney injury) 02/03/2022   NICM (nonischemic cardiomyopathy) (HCC) 02/03/2022   Sinus node dysfunction (HCC)    Atrial flutter (HCC) 01/23/2022   OAB (overactive bladder) 12/27/2020   Nocturia 01/10/2020   Benign prostatic hyperplasia with urinary obstruction 01/10/2020   Non-recurrent unilateral inguinal hernia without obstruction or gangrene    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   History of adenomatous polyp of colon 02/27/2019   Paroxysmal atrial fibrillation (HCC) 07/18/2015   Bradycardia 06/29/2015   Near syncope    Atrial fibrillation with rapid ventricular response (HCC) 06/27/2015   Left ventricular diastolic dysfunction, NYHA class 3 10/26/2014   Kidney cysts  04/04/2013   HTN (hypertension) 03/31/2013    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 03/31/2013   Diastolic dysfunction- grade 2- echo 06/28/15 03/31/2013   RBBB 03/31/2013   AV block, 1st degree 03/31/2013   Sinus bradycardia 03/31/2013   Hematochezia 12/29/2011   Home Medication(s) Prior to Admission medications   Medication Sig Start Date End Date Taking? Authorizing Provider  alfuzosin  (UROXATRAL ) 10 MG 24 hr tablet Take 1 tablet (10 mg total) by mouth every morning. 04/03/24   McKenzie, Belvie CROME, MD  apixaban  (ELIQUIS ) 5 MG TABS tablet Take 1 tablet (5 mg total) by mouth 2 (two) times daily. 08/18/23   Waddell Danelle ORN, MD  carvedilol  (COREG ) 12.5 MG tablet TAKE 1 TABLET(12.5 MG) BY MOUTH TWICE DAILY 04/05/23   Burnard Debby LABOR, MD  cholecalciferol  (VITAMIN D3) 25 MCG (1000 UNIT) tablet Take 1 tablet (1,000 Units total) by mouth daily. 10/28/23   Del Wilhelmena Lloyd Sola, FNP  cloNIDine  (CATAPRES ) 0.1 MG tablet Take 1 tablet (0.1 mg total) by mouth 2 (two) times daily. 10/28/23 01/26/24  Del Orbe Polanco, Iliana, FNP  cycloSPORINE  (RESTASIS ) 0.05 % ophthalmic emulsion Place 1 drop into both eyes 2 (two) times daily. 10/28/23   Del Orbe Polanco, Iliana, FNP  donepezil  (ARICEPT ) 10 MG tablet Take 10 mg by mouth daily. 04/04/23   [provider]  doxazosin  (CARDURA ) 4 MG tablet Take 1 tablet (4 mg total) by mouth at bedtime. 02/11/24   Mallipeddi, Vishnu P, MD  fluticasone  (FLONASE ) 50 MCG/ACT nasal spray Place 2 sprays into both nostrils daily.  08/16/23   Soldatova, Liuba, MD  hydrochlorothiazide  (HYDRODIURIL ) 25 MG tablet TAKE 1 TABLET(25 MG) BY MOUTH DAILY 02/28/24   Del Wilhelmena Falter, Arcadia, FNP  latanoprost  (XALATAN ) 0.005 % ophthalmic solution Place 1 drop into both eyes at bedtime. 02/04/22   Pearlean Manus, MD  levocetirizine (XYZAL ) 5 MG tablet TAKE 1 TABLET(5 MG) BY MOUTH EVERY EVENING 11/02/23   Del Orbe Polanco, Iliana, FNP  mirabegron  ER (MYRBETRIQ ) 50 MG TB24 tablet Take 1 tablet (50 mg total)  by mouth every morning. 04/03/24   McKenzie, Belvie CROME, MD  Misc Natural Products (PROSTATE HEALTH PO) Take 1 capsule by mouth daily. Super Beta Prostate    [provider]  Multiple Vitamin (MULTIVITAMIN WITH MINERALS) TABS tablet Take 1 tablet by mouth daily.    [provider]  Polyethyl Glycol-Propyl Glycol (LUBRICANT EYE DROPS) 0.4-0.3 % SOLN Place 1 drop into both eyes 3 (three) times daily as needed (dry/irritated eyes.).    [provider]  rosuvastatin  (CRESTOR ) 20 MG tablet Take 1 tablet (20 mg total) by mouth daily. 02/02/24   Thukkani, Arun K, MD  sacubitril -valsartan  (ENTRESTO ) 24-26 MG TAKE 1 TABLET BY MOUTH TWICE DAILY 04/05/23   Burnard Debby LABOR, MD  spironolactone  (ALDACTONE ) 25 MG tablet Take 0.5 tablets (12.5 mg total) by mouth daily. 02/28/24   Lelon Glendia DASEN, PA-C                                                                                                                                    Past Surgical History Past Surgical History:  Procedure Laterality Date   BIV PACEMAKER INSERTION CRT-P N/A 01/26/2022   Procedure: BIV PACEMAKER INSERTION CRT-P;  Surgeon: Waddell Danelle ORN, MD;  Location: [facility] INVASIVE CV LAB;  Service: Cardiovascular;  Laterality: N/A;   COLONOSCOPY     COLONOSCOPY  12/30/2011   Procedure: COLONOSCOPY;  Surgeon: Lamar CHRISTELLA Hollingshead, MD;  Location: [facility] ENDO SUITE;  Service: Endoscopy;  Laterality: N/A;  1:45PM   COLONOSCOPY N/A 04/19/2019   Procedure: COLONOSCOPY;  Surgeon: Hollingshead Lamar CHRISTELLA, MD;  Location: [facility];  Service: Endoscopy;  Laterality: N/A;  2:00pm   EXCISION MASS LOWER EXTREMETIES Left 05/12/2018   Procedure: Left foot plantar fibroma excision;  Surgeon: Kit Rush, MD;  Location: [facility];  Service: Orthopedics;  Laterality: Left;   FOOT SURGERY Left    LEFT HEART CATH AND CORONARY ANGIOGRAPHY N/A 01/26/2022   Procedure: LEFT HEART CATH AND CORONARY ANGIOGRAPHY;  Surgeon: Burnard Debby LABOR, MD;  Location:  [facility] INVASIVE CV LAB;  Service: Cardiovascular;  Laterality: N/A;   SPERMATOCELECTOMY Right 11/13/2019   Procedure: EXCISION OF SPERMATIC CORD CYST;  Surgeon: Sherrilee Belvie CROME, MD;  Location: [facility] ORS;  Service: Urology;  Laterality: Right;   VASECTOMY  11/13/2019   Procedure: VASECTOMY;  Surgeon: Sherrilee Belvie CROME, MD;  Location: [facility] ORS;  Service: Urology;;   Family History Family History  Problem Relation Age of Onset   Cancer Brother    CVA Mother    Hypertension Father    Colon cancer Neg Hx     Social History Social History   Tobacco Use   Smoking status: Former    Current packs/day: 0.00    Types: Cigarettes    Quit date: 1996    Years since quitting: 29.7   Smokeless tobacco: Never   Tobacco comments:    quit about 40 yrs ago  Vaping Use   Vaping status: Never Used  Substance Use Topics   Alcohol  use: Not Currently    Comment: 6 pack of beer or less in a week   Drug use: No   Allergies Patient has no known allergies.  Review of Systems Review of Systems  All other systems reviewed and are negative.   Physical Exam Vital Signs  I have reviewed the triage vital signs BP (!) 162/82   Pulse (!) 18   Temp 98.2 F (36.8 C) (Oral)   Resp 19   Wt 103.8 kg   SpO2 100%   BMI 33.79 kg/m  Physical Exam Vitals and nursing note reviewed.  Constitutional:      General: He is not in acute distress.    Appearance: Normal appearance.  HENT:     Mouth/Throat:     Mouth: Mucous membranes are moist.  Eyes:     Conjunctiva/sclera: Conjunctivae normal.  Cardiovascular:     Rate and Rhythm: Normal rate and regular rhythm.  Pulmonary:     Effort: Pulmonary effort is normal. No respiratory distress.     Breath sounds: Normal breath sounds.  Abdominal:     General: Abdomen is flat.     Palpations: Abdomen is soft.     Tenderness: There is no abdominal tenderness.  Musculoskeletal:     Right lower leg: No edema.     Left lower leg: No edema.  Skin:    General:  Skin is warm and dry.     Capillary Refill: Capillary refill takes less than 2 seconds.  Neurological:     Mental Status: He is alert. Mental status is at baseline.     Comments: Cranial nerves II through XII intact, mild right facial droop, mildly confused, moves all 4 extremities equally with symmetric 5 out of 5 strength in the bilateral upper and lower EXTR  Psychiatric:        Mood and Affect: Mood normal.        Behavior: Behavior normal.     ED Results and Treatments Labs (all labs ordered are listed, but only abnormal results are displayed) Labs Reviewed  APTT - Abnormal; Notable for the following components:      Result Value   aPTT 22 (*)    All other components within normal limits  CBC - Abnormal; Notable for the following components:   WBC 3.8 (*)    RBC 5.87 (*)    Hemoglobin 12.8 (*)    MCV 70.9 (*)    MCH 21.8 (*)    RDW 15.6 (*)    Platelets 113 (*)    All other components within normal limits  I-STAT CHEM 8, ED - Abnormal; Notable for the following components:   Potassium 5.7 (*)    BUN 33 (*)    Creatinine, Ser 1.90 (*)    Glucose, Bld 113 (*)    Calcium , Ion 1.12 (*)    All other components within normal limits  CBG MONITORING, ED -  Abnormal; Notable for the following components:   Glucose-Capillary 118 (*)    All other components within normal limits  PROTIME-INR  DIFFERENTIAL  ETHANOL  COMPREHENSIVE METABOLIC PANEL WITH GFR                                                                                                                          Radiology CT ANGIO HEAD NECK W WO CM (CODE STROKE) Result Date: 04/06/2024 CLINICAL DATA:  Neuro deficit, acute, stroke suspected. Right-sided weakness and facial droop. Altered mental status. EXAM: CT ANGIOGRAPHY HEAD AND NECK WITH AND WITHOUT CONTRAST TECHNIQUE: Multidetector CT imaging of the head and neck was performed using the standard protocol during bolus administration of intravenous contrast.  Multiplanar CT image reconstructions and MIPs were obtained to evaluate the vascular anatomy. Carotid stenosis measurements (when applicable) are obtained utilizing NASCET criteria, using the distal internal carotid diameter as the denominator. RADIATION DOSE REDUCTION: This exam was performed according to the departmental dose-optimization program which includes automated exposure control, adjustment of the mA and/or kV according to patient size and/or use of iterative reconstruction technique. CONTRAST:  75mL OMNIPAQUE  IOHEXOL  350 MG/ML SOLN COMPARISON:  None Available. FINDINGS: CTA NECK FINDINGS Aortic arch: Normal variant aortic arch branching pattern with common origin of the brachiocephalic and left common carotid arteries. Mild atherosclerotic calcification in the aortic arch and proximal subclavian arteries. Widely patent brachiocephalic and subclavian arteries. Right carotid system: Patent without evidence of stenosis or dissection. Left carotid system: Patent without evidence of stenosis or dissection. Small amount of nonstenotic calcified plaque in the proximal ICA. Vertebral arteries: Patent with the right being mildly dominant. Minimal atherosclerosis at the vertebral origins. No evidence of a significant stenosis or dissection. Skeleton: No acute osseous abnormality or suspicious lesion. Other neck: Asymmetrically enlarged and heterogeneous left thyroid  lobe with a potential 4 cm nodule centered inferiorly with assessment limited by shoulder artifact. No evidence of cervical lymphadenopathy. Upper chest: Clear lung apices. Review of the MIP images confirms the above findings CTA HEAD FINDINGS Anterior circulation: The internal carotid arteries are patent from skull base to carotid termini with calcified plaque resulting in mild stenosis on the right at the level of the anterior genu. An aneurysm projecting posteriorly and inferiorly from the right supraclinoid ICA measures 6 x 3 mm. ACAs and MCAs are  patent without evidence of a proximal branch occlusion or significant proximal stenosis. Posterior circulation: The intracranial vertebral arteries are widely patent to the basilar. Patent PICA, AICA, and SCA origins are visualized bilaterally. The basilar artery is widely patent. There are small left and diminutive or absent/occluded right posterior communicating arteries. Both PCAs are patent without evidence of a significant proximal stenosis. No aneurysm is identified. Venous sinuses: As permitted by contrast timing, patent. Anatomic variants: None. Review of the MIP images confirms the above findings The finding of no large vessel occlusion was communicated to Dr. Michaela at 12:14 pm on 04/06/2024 by text page via the [facility] messaging  system. IMPRESSION: 1. No large vessel occlusion. 2. Mild intracranial right ICA stenosis. 3. 6 mm right supraclinoid ICA aneurysm. 4. Widely patent cervical carotid and vertebral arteries. 5. Possible 4 cm left thyroid  nodule. Recommend non-emergent thyroid  ultrasound. Reference: J Am Coll Radiol. 2015 Feb;12(2): 143-50 6.  Aortic Atherosclerosis (ICD10-I70.0). Electronically Signed   By: Dasie Hamburg M.D.   On: 04/06/2024 12:25    Pertinent labs & imaging results that were available during my care of the patient were reviewed by me and considered in my medical decision making (see MDM for details).  Medications Ordered in ED Medications  sodium chloride  flush (NS) 0.9 % injection 3 mL (3 mLs Intravenous Not Given 04/06/24 1210)  iohexol  (OMNIPAQUE ) 350 MG/ML injection 75 mL (75 mLs Intravenous Contrast Given 04/06/24 1200)                                                                                                                                     Procedures .Critical Care  Performed by: Francesca Elsie CROME, MD Authorized by: Francesca Elsie CROME, MD   Critical care provider statement:    Critical care time (minutes):  30   Critical care was necessary to  treat or prevent imminent or life-threatening deterioration of the following conditions:  CNS failure or compromise   Critical care was time spent personally by me on the following activities:  Development of treatment plan with patient or surrogate, discussions with consultants, evaluation of patient's response to treatment, examination of patient, ordering and review of laboratory studies, ordering and review of radiographic studies, ordering and performing treatments and interventions, pulse oximetry, re-evaluation of patient's condition and review of old charts   (including critical care time)  Medical Decision Making / ED Course   MDM:  78 year old presenting to the emergency department with code stroke.  Patient overall well-appearing, examination with possible right-sided deficits.  Patient overall well-appearing, did seem to have some slight facial droop.  Was activated as a code stroke.  Denied any other concerning symptoms like chest pain to suggest alternative diagnoses.  Was seen by neurology on arrival.  TNK was considered however patient not eligible due to Eliquis .  Patient symptoms were also felt to be very slight by neurology.  They suspect symptoms are more likely to be due to possible vasovagal episode in the setting of blood draw but would recommend MRI to further evaluate.  Has pacemaker so will need admission.  Also recommend that patient gets EEG.  Discussed with Dr. Fairy with hospitalist will admit patient.      Additional history obtained: -Additional history obtained from ems -External records from outside source obtained and reviewed including: Chart review including previous notes, labs, imaging, consultation notes including prior notes   Lab Tests: -I ordered, reviewed, and interpreted labs.   The pertinent results include:   Labs Reviewed  APTT - Abnormal; Notable for the following components:  Result Value   aPTT 22 (*)    All other components  within normal limits  CBC - Abnormal; Notable for the following components:   WBC 3.8 (*)    RBC 5.87 (*)    Hemoglobin 12.8 (*)    MCV 70.9 (*)    MCH 21.8 (*)    RDW 15.6 (*)    Platelets 113 (*)    All other components within normal limits  I-STAT CHEM 8, ED - Abnormal; Notable for the following components:   Potassium 5.7 (*)    BUN 33 (*)    Creatinine, Ser 1.90 (*)    Glucose, Bld 113 (*)    Calcium , Ion 1.12 (*)    All other components within normal limits  CBG MONITORING, ED - Abnormal; Notable for the following components:   Glucose-Capillary 118 (*)    All other components within normal limits  PROTIME-INR  DIFFERENTIAL  ETHANOL  COMPREHENSIVE METABOLIC PANEL WITH GFR    Notable for mild hyperkalemia on i-STAT, possible hemolysis.  Will recheck  EKG   EKG Interpretation Date/Time:  Thursday April 06 2024 12:06:03 EDT Ventricular Rate:  63 PR Interval:  241 QRS Duration:  206 QT Interval:  514 QTC Calculation: 531 R Axis:   244  Text Interpretation: atrial sensed ventricular paced rhythm Confirmed by Francesca Fallow ([id number]) on 04/06/2024 12:19:13 PM         Imaging Studies ordered: I ordered imaging studies including CTA head and neck On my interpretation imaging demonstrates no large vessel occlusion I independently visualized and interpreted imaging. I agree with the radiologist interpretation   Medicines ordered and prescription drug management: Meds ordered this encounter  Medications   sodium chloride  flush (NS) 0.9 % injection 3 mL   iohexol  (OMNIPAQUE ) 350 MG/ML injection 75 mL    -I have reviewed the patients home medicines and have made adjustments as needed   Consultations Obtained: I requested consultation with the neurologist,  and discussed lab and imaging findings as well as pertinent plan - they recommend: Admission   Social Determinants of Health:  Diagnosis or treatment significantly limited by social determinants of  health: obesity   Reevaluation: After the interventions noted above, I reevaluated the patient and found that their symptoms have improved  Co morbidities that complicate the patient evaluation  Past Medical History:  Diagnosis Date   1st degree AV block    Atrial fibrillation (HCC)    Diastolic dysfunction 4/13   grade 1   Hypertension    New onset atrial fibrillation (HCC) 06/2015   RBBB    Sinus bradycardia    Stroke (HCC)    no defecits, pt denies having a stroke, says Dr Burnard diagnosed this      Dispostion: Disposition decision including need for hospitalization was considered, and patient admitted to the hospital.    Final Clinical Impression(s) / ED Diagnoses Final diagnoses:  None     This chart was dictated using voice recognition software.  Despite best efforts to proofread,  errors can occur which can change the documentation meaning.    Francesca Fallow CROME, MD 04/06/24 1450

## 2024-04-06 NOTE — ED Notes (Signed)
 CCMD called and verified patient on cardiac telemetry

## 2024-04-06 NOTE — ED Notes (Signed)
 Charge RN unable to find ER RN to do an US  for patient. IV team called per Charge by secretary and IV team consult ordered placed.

## 2024-04-06 NOTE — Consult Note (Signed)
 NEUROLOGY CONSULT NOTE   Date of service: April 06, 2024 Patient Name: Joseph [name] MRN:  [id number] DOB:  1945/12/07 Chief Complaint: AMS Requesting Provider: Francesca Elsie CROME, MD  History of Present Illness  Joseph [name] is a 78 y.o. male with hx of dementia, atrial fibrillation, hypertension, stroke who presents following an episode of loss of consciousness while having his blood drawn earlier today.  He was in his normal state of health and getting labs drawn at which point he had sudden onset decreased responsiveness.  His wife was not present at the onset of the symptoms, but she heard them calling his name and she ran back to see him.  His wife describes that he seemed groggy and out of it, no twitching or definite seizure activity was noted.  EMS was called, and he gradually improved in transport and by the time my evaluation he had significantly improved.  LKW: 1010 IV Thrombolysis: No, anticoagulated EVT: No, no LVO  NIHSS: Five     Past History   Past Medical History:  Diagnosis Date   1st degree AV block    Atrial fibrillation (HCC)    Diastolic dysfunction 4/13   grade 1   Hypertension    New onset atrial fibrillation (HCC) 06/2015   RBBB    Sinus bradycardia    Stroke (HCC)    no defecits, pt denies having a stroke, says Dr Burnard diagnosed this    Past Surgical History:  Procedure Laterality Date   BIV PACEMAKER INSERTION CRT-P N/A 01/26/2022   Procedure: BIV PACEMAKER INSERTION CRT-P;  Surgeon: Waddell Danelle ORN, MD;  Location: [facility] INVASIVE CV LAB;  Service: Cardiovascular;  Laterality: N/A;   COLONOSCOPY     COLONOSCOPY  12/30/2011   Procedure: COLONOSCOPY;  Surgeon: Lamar CHRISTELLA Hollingshead, MD;  Location: [facility] ENDO SUITE;  Service: Endoscopy;  Laterality: N/A;  1:45PM   COLONOSCOPY N/A 04/19/2019   Procedure: COLONOSCOPY;  Surgeon: Hollingshead Lamar CHRISTELLA, MD;  Location: [facility] ENDO SUITE;  Service: Endoscopy;  Laterality: N/A;  2:00pm   EXCISION MASS LOWER  EXTREMETIES Left 05/12/2018   Procedure: Left foot plantar fibroma excision;  Surgeon: Kit Rush, MD;  Location:  SURGERY CENTER;  Service: Orthopedics;  Laterality: Left;   FOOT SURGERY Left    LEFT HEART CATH AND CORONARY ANGIOGRAPHY N/A 01/26/2022   Procedure: LEFT HEART CATH AND CORONARY ANGIOGRAPHY;  Surgeon: Burnard Debby LABOR, MD;  Location: [facility] INVASIVE CV LAB;  Service: Cardiovascular;  Laterality: N/A;   SPERMATOCELECTOMY Right 11/13/2019   Procedure: EXCISION OF SPERMATIC CORD CYST;  Surgeon: Sherrilee Belvie CROME, MD;  Location: [facility] ORS;  Service: Urology;  Laterality: Right;   VASECTOMY  11/13/2019   Procedure: VASECTOMY;  Surgeon: Sherrilee Belvie CROME, MD;  Location: [facility] ORS;  Service: Urology;;    Family History: Family History  Problem Relation Age of Onset   Cancer Brother    CVA Mother    Hypertension Father    Colon cancer Neg Hx     Social History  reports that he quit smoking about 29 years ago. His smoking use included cigarettes. He has never used smokeless tobacco. He reports that he does not currently use alcohol . He reports that he does not use drugs.  No Known Allergies  Medications   Current Facility-Administered Medications:    sodium chloride  flush (NS) 0.9 % injection 3 mL, 3 mL, Intravenous, Once, Scheving, Elsie CROME, MD  Current Outpatient Medications:    alfuzosin  (UROXATRAL ) 10 MG 24  hr tablet, Take 1 tablet (10 mg total) by mouth every morning., Disp: 90 tablet, Rfl: 3   apixaban  (ELIQUIS ) 5 MG TABS tablet, Take 1 tablet (5 mg total) by mouth 2 (two) times daily., Disp: 180 tablet, Rfl: 1   carvedilol  (COREG ) 12.5 MG tablet, TAKE 1 TABLET(12.5 MG) BY MOUTH TWICE DAILY, Disp: 180 tablet, Rfl: 3   cholecalciferol  (VITAMIN D3) 25 MCG (1000 UNIT) tablet, Take 1 tablet (1,000 Units total) by mouth daily., Disp: 90 tablet, Rfl: 1   cloNIDine  (CATAPRES ) 0.1 MG tablet, Take 1 tablet (0.1 mg total) by mouth 2 (two) times daily., Disp: 180 tablet, Rfl: 0    cycloSPORINE  (RESTASIS ) 0.05 % ophthalmic emulsion, Place 1 drop into both eyes 2 (two) times daily., Disp: 60 each, Rfl: 2   donepezil  (ARICEPT ) 10 MG tablet, Take 10 mg by mouth daily., Disp: , Rfl:    doxazosin  (CARDURA ) 4 MG tablet, Take 1 tablet (4 mg total) by mouth at bedtime., Disp: 90 tablet, Rfl: 1   fluticasone  (FLONASE ) 50 MCG/ACT nasal spray, Place 2 sprays into both nostrils daily., Disp: 16 g, Rfl: 6   hydrochlorothiazide  (HYDRODIURIL ) 25 MG tablet, TAKE 1 TABLET(25 MG) BY MOUTH DAILY, Disp: 90 tablet, Rfl: 0   latanoprost  (XALATAN ) 0.005 % ophthalmic solution, Place 1 drop into both eyes at bedtime., Disp: 2.5 mL, Rfl: 12   levocetirizine (XYZAL ) 5 MG tablet, TAKE 1 TABLET(5 MG) BY MOUTH EVERY EVENING, Disp: 30 tablet, Rfl: 3   mirabegron  ER (MYRBETRIQ ) 50 MG TB24 tablet, Take 1 tablet (50 mg total) by mouth every morning., Disp: 90 tablet, Rfl: 3   Misc Natural Products (PROSTATE HEALTH PO), Take 1 capsule by mouth daily. Super Beta Prostate, Disp: , Rfl:    Multiple Vitamin (MULTIVITAMIN WITH MINERALS) TABS tablet, Take 1 tablet by mouth daily., Disp: , Rfl:    Polyethyl Glycol-Propyl Glycol (LUBRICANT EYE DROPS) 0.4-0.3 % SOLN, Place 1 drop into both eyes 3 (three) times daily as needed (dry/irritated eyes.)., Disp: , Rfl:    rosuvastatin  (CRESTOR ) 20 MG tablet, Take 1 tablet (20 mg total) by mouth daily., Disp: 30 tablet, Rfl: 3   sacubitril -valsartan  (ENTRESTO ) 24-26 MG, TAKE 1 TABLET BY MOUTH TWICE DAILY, Disp: 180 tablet, Rfl: 3   spironolactone  (ALDACTONE ) 25 MG tablet, Take 0.5 tablets (12.5 mg total) by mouth daily., Disp: 45 tablet, Rfl: 1  Vitals   Vitals:   04/06/24 1100 04/06/24 1149 04/06/24 1206 04/06/24 1208  BP:  (!) 163/85  (!) 165/81  Pulse:  (!) 59  60  Resp:  18  17  Temp:   98.2 F (36.8 C)   TempSrc:   Oral   SpO2:  100%  99%  Weight: 103.8 kg       Body mass index is 33.79 kg/m.   Physical Exam   Constitutional: Appears well-developed and  well-nourished.  Neurologic Examination    Neuro: Mental Status: Patient is awake, alert, he is unable to give me the month or year  No signs of aphasia or neglect Cranial Nerves: II: Visual Fields are full. Pupils are equal, round, and reactive to light.   III,IV, VI: EOMI without ptosis or diploplia.  V: Facial sensation is symmetric to temperature VII: Facial movement with mild right facial weakness, though when I ask his wife if this is his baseline, she suspects that it is. VIII: hearing is intact to voice X: Uvula elevates symmetrically XII: tongue is midline without atrophy or fasciculations.  Motor: Tone is normal.  Bulk is normal. 5/5 strength was present in all four extremities.  Sensory: Sensation is diminished in the right arm and leg Cerebellar: FNF and HKS are intact bilaterally        Labs/Imaging/Neurodiagnostic studies   CBC:  Recent Labs  Lab 2024/04/15 1207  HGB 14.3  HCT 42.0   Basic Metabolic Panel:  Lab Results  Component Value Date   NA 139 April 15, 2024   K 5.7 (H) Apr 15, 2024   CO2 27 01/19/2024   GLUCOSE 113 (H) 2024-04-15   BUN 33 (H) 2024/04/15   CREATININE 1.90 (H) 04/15/24   CALCIUM  8.8 01/19/2024   GFRNONAA 53 (L) 04/22/2023   GFRAA >60 11/06/2019   Lipid Panel:  Lab Results  Component Value Date   LDLCALC 106 (H) 01/19/2024   HgbA1c:  Lab Results  Component Value Date   HGBA1C 6.0 (H) 03/16/2024   Urine Drug Screen:     Component Value Date/Time   LABOPIA NONE DETECTED 06/28/2015 1040   COCAINSCRNUR NONE DETECTED 06/28/2015 1040   LABBENZ NONE DETECTED 06/28/2015 1040   AMPHETMU NONE DETECTED 06/28/2015 1040   THCU NONE DETECTED 06/28/2015 1040   LABBARB NONE DETECTED 06/28/2015 1040    Alcohol  Level No results found for: [facility] INR  Lab Results  Component Value Date   INR 1.1 11/06/2019     ASSESSMENT   Joseph [name] is a 78 y.o. male with a history of dementia who presents with sudden mental status change  in the setting of venipuncture.  He does have a pacemaker, which makes vasovagal syncope less likely.  I do think a vagal episode is possible, as you can have vasodilation, and in the setting of not eating prior to his labs, it is possible he was somewhat volume depressed as well.  Given the report of focal symptoms, I do think an MRI would be reasonable, this is likely have to be done tomorrow.  RECOMMENDATIONS  MRI brain EEG Orthostatics Consider pacemaker interrogation Neurology will follow ______________________________________________________________________    Signed, Aisha Seals, MD [facility]

## 2024-04-07 ENCOUNTER — Observation Stay (HOSPITAL_COMMUNITY)

## 2024-04-07 ENCOUNTER — Ambulatory Visit (HOSPITAL_COMMUNITY)

## 2024-04-07 DIAGNOSIS — I739 Peripheral vascular disease, unspecified: Secondary | ICD-10-CM

## 2024-04-07 DIAGNOSIS — I6381 Other cerebral infarction due to occlusion or stenosis of small artery: Secondary | ICD-10-CM | POA: Diagnosis not present

## 2024-04-07 DIAGNOSIS — I639 Cerebral infarction, unspecified: Secondary | ICD-10-CM | POA: Diagnosis not present

## 2024-04-07 DIAGNOSIS — I6782 Cerebral ischemia: Secondary | ICD-10-CM | POA: Diagnosis not present

## 2024-04-07 DIAGNOSIS — R569 Unspecified convulsions: Secondary | ICD-10-CM

## 2024-04-07 DIAGNOSIS — R55 Syncope and collapse: Secondary | ICD-10-CM | POA: Diagnosis not present

## 2024-04-07 DIAGNOSIS — G319 Degenerative disease of nervous system, unspecified: Secondary | ICD-10-CM | POA: Diagnosis not present

## 2024-04-07 DIAGNOSIS — R4182 Altered mental status, unspecified: Secondary | ICD-10-CM

## 2024-04-07 DIAGNOSIS — Z8673 Personal history of transient ischemic attack (TIA), and cerebral infarction without residual deficits: Secondary | ICD-10-CM | POA: Diagnosis not present

## 2024-04-07 LAB — LIPID PANEL
Cholesterol: 139 mg/dL (ref 0–200)
HDL: 41 mg/dL (ref >40)
LDL Cholesterol: 81 mg/dL (ref 0–99)
Total CHOL/HDL Ratio: 3.4 ratio
Triglycerides: 86 mg/dL (ref <150)
VLDL: 17 mg/dL (ref 0–40)

## 2024-04-07 LAB — COMPREHENSIVE METABOLIC PANEL WITH GFR
ALT: 12 U/L (ref 0–44)
AST: 17 U/L (ref 15–41)
Albumin: 3.1 g/dL — ABNORMAL LOW (ref 3.5–5.0)
Alkaline Phosphatase: 52 U/L (ref 38–126)
Anion gap: 13 (ref 5–15)
BUN: 28 mg/dL — ABNORMAL HIGH (ref 8–23)
CO2: 20 mmol/L — ABNORMAL LOW (ref 22–32)
Calcium: 8.6 mg/dL — ABNORMAL LOW (ref 8.9–10.3)
Chloride: 103 mmol/L (ref 98–111)
Creatinine, Ser: 1.6 mg/dL — ABNORMAL HIGH (ref 0.61–1.24)
GFR, Estimated: 44 mL/min — ABNORMAL LOW (ref >60)
Glucose, Bld: 99 mg/dL (ref 70–99)
Potassium: 4 mmol/L (ref 3.5–5.1)
Sodium: 136 mmol/L (ref 135–145)
Total Bilirubin: 1.4 mg/dL — ABNORMAL HIGH (ref 0.0–1.2)
Total Protein: 6 g/dL — ABNORMAL LOW (ref 6.5–8.1)

## 2024-04-07 LAB — CBC
HCT: 38.8 % — ABNORMAL LOW (ref 39.0–52.0)
Hemoglobin: 12.1 g/dL — ABNORMAL LOW (ref 13.0–17.0)
MCH: 22 pg — ABNORMAL LOW (ref 26.0–34.0)
MCHC: 31.2 g/dL (ref 30.0–36.0)
MCV: 70.4 fL — ABNORMAL LOW (ref 80.0–100.0)
Platelets: 179 K/uL (ref 150–400)
RBC: 5.51 MIL/uL (ref 4.22–5.81)
RDW: 15.2 % (ref 11.5–15.5)
WBC: 5.6 K/uL (ref 4.0–10.5)
nRBC: 0 % (ref 0.0–0.2)

## 2024-04-07 LAB — ECHOCARDIOGRAM COMPLETE
Area-P 1/2: 3.27 cm2
Calc EF: 44 %
S' Lateral: 3.4 cm
Single Plane A2C EF: 39.4 %
Single Plane A4C EF: 48.8 %
Weight: 3661.4 [oz_av]

## 2024-04-07 MED ORDER — SPIRONOLACTONE 25 MG PO TABS
25.0000 mg | ORAL_TABLET | Freq: Every day | ORAL | Status: DC
  Administered 2024-04-07 – 2024-04-13 (×7): 25 mg via ORAL
  Filled 2024-04-07 (×7): qty 1

## 2024-04-07 MED ORDER — LORAZEPAM 2 MG/ML IJ SOLN
1.0000 mg | Freq: Every day | INTRAMUSCULAR | Status: AC | PRN
Start: 2024-04-07 — End: 2024-04-07
  Administered 2024-04-07: 1 mg via INTRAVENOUS
  Filled 2024-04-07: qty 1

## 2024-04-07 MED ORDER — MELATONIN 3 MG PO TABS
3.0000 mg | ORAL_TABLET | Freq: Every evening | ORAL | Status: DC | PRN
  Administered 2024-04-07 – 2024-04-13 (×6): 3 mg via ORAL
  Filled 2024-04-07 (×6): qty 1

## 2024-04-07 MED ORDER — HALOPERIDOL LACTATE 5 MG/ML IJ SOLN
1.0000 mg | Freq: Four times a day (QID) | INTRAMUSCULAR | Status: DC | PRN
Start: 2024-04-07 — End: 2024-04-11
  Administered 2024-04-07: 1 mg via INTRAVENOUS
  Filled 2024-04-07: qty 1

## 2024-04-07 MED ORDER — PERFLUTREN LIPID MICROSPHERE
1.0000 mL | INTRAVENOUS | Status: AC | PRN
  Administered 2024-04-07: 2 mL via INTRAVENOUS

## 2024-04-07 NOTE — Plan of Care (Signed)
  Problem: Education: Goal: Knowledge of General Education information will improve Description: Including pain rating scale, medication(s)/side effects and non-pharmacologic comfort measures Outcome: Progressing   Problem: Clinical Measurements: Goal: Respiratory complications will improve Outcome: Progressing Goal: Cardiovascular complication will be avoided Outcome: Progressing   Problem: Clinical Measurements: Goal: Cardiovascular complication will be avoided Outcome: Progressing   Problem: Nutrition: Goal: Adequate nutrition will be maintained Outcome: Progressing   Problem: Coping: Goal: Level of anxiety will decrease Outcome: Progressing   Problem: Elimination: Goal: Will not experience complications related to urinary retention Outcome: Progressing   Problem: Pain Managment: Goal: General experience of comfort will improve and/or be controlled Outcome: Progressing   Problem: Safety: Goal: Ability to remain free from injury will improve Outcome: Progressing   Problem: Skin Integrity: Goal: Risk for impaired skin integrity will decrease Outcome: Progressing

## 2024-04-07 NOTE — Progress Notes (Signed)
 Carotid duplex has been completed.   Results can be found under chart review under CV PROC. 04/07/2024 3:03 PM Jaquille Kau RVT, RDMS

## 2024-04-07 NOTE — Progress Notes (Signed)
 Patient was taken to the MRI via bed.

## 2024-04-07 NOTE — Progress Notes (Signed)
 0100 Pt is anxious and confused as pt states, nothing is wrong with him & attempting to pull out his PIV access.

## 2024-04-07 NOTE — Progress Notes (Signed)
 Routine EEG complete. Results pending.

## 2024-04-07 NOTE — Care Management Obs Status (Signed)
 MEDICARE OBSERVATION STATUS NOTIFICATION   Patient Details  Name: Joseph [name] MRN: [id number] Date of Birth: 1946/02/05   Medicare Observation Status Notification Given:  Yes  Obs signed and copy given   Claretta Deed 04/07/2024, 1:45 PM

## 2024-04-07 NOTE — Progress Notes (Signed)
 SLP Cancellation Note  Patient Details Name: Joseph [name] MRN: [id number] DOB: 22-Oct-1945   Cancelled treatment:       Reason Eval/Treat Not Completed: SLP screened. Pt has a history of dementia and MRI negative for acute CVA. No needs identified, will sign off.   Damien Blumenthal, M.A., CCC-SLP Speech Language Pathology, Acute Rehabilitation Services  Secure Chat preferred [phone]  04/07/2024, 4:04 PM

## 2024-04-07 NOTE — Progress Notes (Signed)
Patient is back after MRI.

## 2024-04-07 NOTE — Procedures (Addendum)
 Patient Name: Joseph [name]  MRN: [id number]  Epilepsy Attending: Arlin MALVA Krebs  Referring Physician/Provider: Michaela Aisha SQUIBB, MD  Date: 04/07/2024 Duration: 22.10 mins  Patient history: 78 y.o. male with a history of dementia who presents with sudden mental status change in the setting of venipuncture. EEG to evaluate for seizure  Level of alertness: Awake  AEDs during EEG study: None  Technical aspects: This EEG study was done with scalp electrodes positioned according to the 10-20 International system of electrode placement. Electrical activity was reviewed with band pass filter of 1-70Hz , sensitivity of 7 uV/mm, display speed of 77mm/sec with a 60Hz  notched filter applied as appropriate. EEG data were recorded continuously and digitally stored.  Video monitoring was available and reviewed as appropriate.  Description: The posterior dominant rhythm consists of 8 Hz activity of moderate voltage (25-35 uV) seen predominantly in posterior head regions, symmetric and reactive to eye opening and eye closing. Hyperventilation and photic stimulation were not performed.     IMPRESSION: This study is within normal limits. No seizures or epileptiform discharges were seen throughout the recording.  A normal interictal EEG does not exclude the diagnosis of epilepsy.   Nabor Thomann O Jasnoor Trussell

## 2024-04-07 NOTE — Progress Notes (Signed)
 NEUROLOGY CONSULT FOLLOW UP NOTE   Date of service: April 07, 2024 Patient Name: Joseph [name] MRN:  [id number] DOB:  [birth year]  Interval Hx/subjective   No further events, some mild confusion  Vitals   Vitals:   04/06/24 2020 04/07/24 0054 04/07/24 0747 04/07/24 0805  BP: (!) 156/88 (!) 171/87  (!) 158/82  Pulse: 72 60 66 61  Resp: 17   16  Temp: 98.5 F (36.9 C) 98.3 F (36.8 C)  98.5 F (36.9 C)  TempSrc:  Oral    SpO2: 98% 100%  97%  Weight:         Body mass index is 33.79 kg/m.  Physical Exam   Constitutional: Appears well-developed and well-nourished.  Neurologic Examination    MS: Awake, alert, not oriented to month or year, pleasant and interactive CN: Visual fields full, EOMI, Joseph [name] has mild right facial weakness Motor: No drift Sensory: Intact to light touch  Medications  Current Facility-Administered Medications:    0.9 %  sodium chloride  infusion, , Intravenous, Continuous, Fairy Frames, MD, Last Rate: 40 mL/hr at 04/06/24 2148, New Bag at 04/06/24 2148   acetaminophen  (TYLENOL ) tablet 650 mg, 650 mg, Oral, Q4H PRN **OR** acetaminophen  (TYLENOL ) 160 MG/5ML solution 650 mg, 650 mg, Per Tube, Q4H PRN **OR** acetaminophen  (TYLENOL ) suppository 650 mg, 650 mg, Rectal, Q4H PRN, Fairy Frames, MD   alfuzosin  (UROXATRAL ) 24 hr tablet 10 mg, 10 mg, Oral, q morning, Makhari, Preetha, MD, 10 mg at 04/07/24 9051   apixaban  (ELIQUIS ) tablet 5 mg, 5 mg, Oral, BID, Leo, Preetha, MD, 5 mg at 04/07/24 0945   artificial tears ophthalmic solution 1 drop, 1 drop, Both Eyes, TID PRN, Fairy Frames, MD   carvedilol  (COREG ) tablet 3.125 mg, 3.125 mg, Oral, BID WC, Sufian, Preetha, MD, 3.125 mg at 04/07/24 9251   cycloSPORINE  (RESTASIS ) 0.05 % ophthalmic emulsion 1 drop, 1 drop, Both Eyes, BID, Fairy Frames, MD, 1 drop at 04/06/24 2132   donepezil  (ARICEPT ) tablet 10 mg, 10 mg, Oral, Daily, Shun, Preetha, MD, 10 mg at 04/07/24 0945   latanoprost  (XALATAN )  0.005 % ophthalmic solution 1 drop, 1 drop, Both Eyes, QHS, Fairy Frames, MD, 1 drop at 04/06/24 2133   LORazepam  (ATIVAN ) injection 1 mg, 1 mg, Intravenous, Daily PRN, Corian, Preetha, MD   melatonin tablet 3 mg, 3 mg, Oral, QHS PRN, Opyd, Timothy S, MD, 3 mg at 04/07/24 0134   mirabegron  ER (MYRBETRIQ ) tablet 50 mg, 50 mg, Oral, q morning, Ellard, Preetha, MD, 50 mg at 04/07/24 9052   perflutren  lipid microspheres (DEFINITY ) IV suspension, 1-10 mL, Intravenous, PRN, Fairy Frames, MD   rosuvastatin  (CRESTOR ) tablet 20 mg, 20 mg, Oral, Daily, Crosby, Preetha, MD, 20 mg at 04/07/24 9046   senna-docusate (Senokot-S) tablet 1 tablet, 1 tablet, Oral, QHS PRN, Fairy Frames, MD   sodium chloride  flush (NS) 0.9 % injection 3 mL, 3 mL, Intravenous, Once, Fairy Frames, MD  Labs and Diagnostic Imaging   Creatinine 1.6  Imaging(Personally reviewed): CT head is negative  Assessment   Joseph [name] is a 78 y.o. male with a history of dementia who presents with sudden mental status change in the setting of venipuncture.  Joseph [name] does have a pacemaker, which makes vasovagal syncope less likely.  I do think a vagal episode is possible, as you can have vasodilation, and in the setting of not eating prior to his labs, it is possible Joseph [name] was somewhat volume depressed as well.  Given the report of focal symptoms,  I do think an MRI would be reasonable, as is an EEG, and these are pending  Recommendations  We will follow-up EEG and MRI I would only start antiepileptics if the EEG shows evidence of seizure predisposition ______________________________________________________________________   Signed, Aisha Seals, MD [facility]

## 2024-04-07 NOTE — Progress Notes (Signed)
 PROGRESS NOTE    Joseph [name]  FMW:[id number] DOB: 1946-02-27 DOA: 04/06/2024 PCP: Terry Wilhelmena Lloyd Hilario, FNP   Joseph [name] is a 78/M with history of prior CVA, dementia, paroxysmal A-fib on Eliquis , diastolic CHF was getting routine lab drawn today, his wife was in the waiting room when she was called in, patient had a syncopal event with loss of consciousness, there after some facial droop and abnormal speech was noted as well which gradually resolved as he regained consciousness.  Subsequently brought to the ED as a code stroke, ED Course: Hypertensive, labs notable for potassium of 5.7, creatinine 1.9  Subjective: -Confusion overnight, anxious to go home  Assessment and Plan:   Syncope and collapse ?  Transient facial droop -Appears to be a syncopal episode, possibly vasovagal  - Orthostatics pending will reorder -Neurology consult appreciated, follow-up MRI brain and EEG -Increase activity, PT OT eval today  Paroxysmal atrial fibrillation  - Poor compliance with Eliquis  at baseline, only takes a.m. 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 - Creatinine slightly higher than baseline, hold Entresto  and Aldactone  Hyperkalemia - Now improving, restart Aldactone    Dementia - Resume Aricept    Hypertension - Holding Entresto , clonidine , HCTZ at this time      DVT prophylaxis: Lovenox Code Status: Full code Family Communication: None present, updated wife yesterday Disposition Plan: Home pending above workup  Consultants:    Procedures:   Antimicrobials:    Objective: Vitals:   04/06/24 2020 04/07/24 0054 04/07/24 0747 04/07/24 0805  BP: (!) 156/88 (!) 171/87  (!) 158/82  Pulse: 72 60 66 61  Resp: 17   16  Temp: 98.5 F (36.9 C) 98.3 F (36.8 C)  98.5 F (36.9 C)  TempSrc:  Oral    SpO2: 98%  100%  97%  Weight:        Intake/Output Summary (Last 24 hours) at 04/07/2024 1128 Last data filed at 04/06/2024 2247 Gross per 24 hour  Intake --  Output 125 ml  Net -125 ml   Filed Weights   04/06/24 1100  Weight: 103.8 kg    Examination:  General exam: Appears calm and comfortable, mild cognitive deficits Respiratory system: Clear to auscultation Cardiovascular system: S1 & S2 heard, RRR.  Abd: nondistended, soft and nontender.Normal bowel sounds heard. Central nervous system: Alert and oriented X2. No focal neurological deficits. Extremities: no edema Skin: No rashes Psychiatry:  Mood & affect appropriate.     Data Reviewed:   CBC: Recent Labs  Lab 04/06/24 1159 04/06/24 1207 04/07/24 0530  WBC 3.8*  --  5.6  NEUTROABS 2.1  --   --   HGB 12.8* 14.3 12.1*  HCT 41.6 42.0 38.8*  MCV 70.9*  --  70.4*  PLT 113*  --  179   Basic Metabolic Panel: Recent Labs  Lab 04/06/24 1207 04/06/24 1456 04/07/24 0530  NA 139 137 136  K 5.7* 4.7 4.0  CL 105 101 103  CO2  --  25 20*  GLUCOSE 113* 102* 99  BUN 33* 22 28*  CREATININE 1.90* 1.58* 1.60*  CALCIUM   --  9.0 8.6*   GFR: Estimated Creatinine Clearance: 46.6 mL/min (A) (by C-G formula based on SCr of 1.6 mg/dL (H)). Liver Function Tests: Recent Labs  Lab 04/06/24 1456 04/07/24 0530  AST 23 17  ALT 15  12  ALKPHOS 61 52  BILITOT 1.5* 1.4*  PROT 6.7 6.0*  ALBUMIN 3.6 3.1*   No results for input(s): LIPASE, AMYLASE in the last 168 hours. No results for input(s): AMMONIA in the last 168 hours. Coagulation Profile: Recent Labs  Lab 04/06/24 1159  INR 1.1   Cardiac Enzymes: No results for input(s): CKTOTAL, CKMB, CKMBINDEX, TROPONINI in the last 168 hours. BNP (last 3 results) No results for input(s): PROBNP in the last 8760 hours. HbA1C: No results for input(s): HGBA1C in the last 72 hours. CBG: Recent Labs  Lab 04/06/24 1118  GLUCAP 118*   Lipid Profile: Recent Labs     04/07/24 0530  CHOL 139  HDL 41  LDLCALC 81  TRIG 86  CHOLHDL 3.4   Thyroid  Function Tests: No results for input(s): TSH, T4TOTAL, FREET4, T3FREE, THYROIDAB in the last 72 hours. Anemia Panel: No results for input(s): VITAMINB12, FOLATE, FERRITIN, TIBC, IRON, RETICCTPCT in the last 72 hours. Urine analysis:    Component Value Date/Time   COLORURINE YELLOW 04/22/2023 1732   APPEARANCEUR Clear 04/03/2024 1053   LABSPEC 1.017 04/22/2023 1732   PHURINE 5.0 04/22/2023 1732   GLUCOSEU Negative 04/03/2024 1053   HGBUR NEGATIVE 04/22/2023 1732   BILIRUBINUR Negative 04/03/2024 1053   KETONESUR NEGATIVE 04/22/2023 1732   PROTEINUR Negative 04/03/2024 1053   PROTEINUR NEGATIVE 04/22/2023 1732   NITRITE Negative 04/03/2024 1053   NITRITE NEGATIVE 04/22/2023 1732   LEUKOCYTESUR Negative 04/03/2024 1053   LEUKOCYTESUR NEGATIVE 04/22/2023 1732   Sepsis Labs: @LABRCNTIP (procalcitonin:4,lacticidven:4)  )No results found for this or any previous visit (from the past 240 hours).   Radiology Studies: CT ANGIO HEAD NECK W WO CM (CODE STROKE) Result Date: 04/06/2024 CLINICAL DATA:  Neuro deficit, acute, stroke suspected. Right-sided weakness and facial droop. Altered mental status. EXAM: CT ANGIOGRAPHY HEAD AND NECK WITH AND WITHOUT CONTRAST TECHNIQUE: Multidetector CT imaging of the head and neck was performed using the standard protocol during bolus administration of intravenous contrast. Multiplanar CT image reconstructions and MIPs were obtained to evaluate the vascular anatomy. Carotid stenosis measurements (when applicable) are obtained utilizing NASCET criteria, using the distal internal carotid diameter as the denominator. RADIATION DOSE REDUCTION: This exam was performed according to the departmental dose-optimization program which includes automated exposure control, adjustment of the mA and/or kV according to patient size and/or use of iterative reconstruction  technique. CONTRAST:  75mL OMNIPAQUE  IOHEXOL  350 MG/ML SOLN COMPARISON:  None Available. FINDINGS: CTA NECK FINDINGS Aortic arch: Normal variant aortic arch branching pattern with common origin of the brachiocephalic and left common carotid arteries. Mild atherosclerotic calcification in the aortic arch and proximal subclavian arteries. Widely patent brachiocephalic and subclavian arteries. Right carotid system: Patent without evidence of stenosis or dissection. Left carotid system: Patent without evidence of stenosis or dissection. Small amount of nonstenotic calcified plaque in the proximal ICA. Vertebral arteries: Patent with the right being mildly dominant. Minimal atherosclerosis at the vertebral origins. No evidence of a significant stenosis or dissection. Skeleton: No acute osseous abnormality or suspicious lesion. Other neck: Asymmetrically enlarged and heterogeneous left thyroid  lobe with a potential 4 cm nodule centered inferiorly with assessment limited by shoulder artifact. No evidence of cervical lymphadenopathy. Upper chest: Clear lung apices. Review of the MIP images confirms the above findings CTA HEAD FINDINGS Anterior circulation: The internal carotid arteries are patent from skull base to carotid termini with calcified plaque resulting in mild stenosis on the right at the level of the anterior genu. An aneurysm projecting  posteriorly and inferiorly from the right supraclinoid ICA measures 6 x 3 mm. ACAs and MCAs are patent without evidence of a proximal branch occlusion or significant proximal stenosis. Posterior circulation: The intracranial vertebral arteries are widely patent to the basilar. Patent PICA, AICA, and SCA origins are visualized bilaterally. The basilar artery is widely patent. There are small left and diminutive or absent/occluded right posterior communicating arteries. Both PCAs are patent without evidence of a significant proximal stenosis. No aneurysm is identified. Venous  sinuses: As permitted by contrast timing, patent. Anatomic variants: None. Review of the MIP images confirms the above findings The finding of no large vessel occlusion was communicated to Dr. Michaela at 12:14 pm on 04/06/2024 by text page via the [facility] messaging system. IMPRESSION: 1. No large vessel occlusion. 2. Mild intracranial right ICA stenosis. 3. 6 mm right supraclinoid ICA aneurysm. 4. Widely patent cervical carotid and vertebral arteries. 5. Possible 4 cm left thyroid  nodule. Recommend non-emergent thyroid  ultrasound. Reference: J Am Coll Radiol. 2015 Feb;12(2): 143-50 6.  Aortic Atherosclerosis (ICD10-I70.0). Electronically Signed   By: Dasie Hamburg M.D.   On: 04/06/2024 12:25     Scheduled Meds:  alfuzosin   10 mg Oral q morning   apixaban   5 mg Oral BID   carvedilol   3.125 mg Oral BID WC   cycloSPORINE   1 drop Both Eyes BID   donepezil   10 mg Oral Daily   latanoprost   1 drop Both Eyes QHS   mirabegron  ER  50 mg Oral q morning   rosuvastatin   20 mg Oral Daily   sodium chloride  flush  3 mL Intravenous Once   Continuous Infusions:  sodium chloride  Stopped (04/07/24 1100)     LOS: 0 days    Time spent:    Sigurd Pac, MD [facility]   04/07/2024, 11:28 AM

## 2024-04-07 NOTE — Progress Notes (Addendum)
 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 Pt with staff in room earlier, now off unit. Will continue efforts to complete SLE.   Anette Barra B. Dory, MSP, CCC-SLP Speech Language Pathologist Office: [phone]  [name] 04/07/2024, 9:47 AM

## 2024-04-07 NOTE — Discharge Summary (Signed)
 Physician Discharge Summary  Joseph [name] FMW:[id number] DOB: 1946-02-27 DOA: 04/06/2024  PCP: Terry Wilhelmena Lloyd Hilario, FNP  Admit date: 04/06/2024 Discharge date: 04/10/2024  Time spent: 45 minutes  Recommendations for Outpatient Follow-up:  PCP in 1 week SNF for short-term rehab   Discharge Diagnoses:  Principal Problem:   Syncope and collapse Dementia Chronic diastolic CHF Paroxysmal atrial fibrillation AKI on CKD 3a Hyperkalemia  Discharge Condition: Improved  Diet recommendation: Sodium, heart healthy  Filed Weights   04/06/24 1100  Weight: 103.8 kg    History of present illness:  Joseph [name] is a 78/M with history of prior CVA, dementia, paroxysmal A-fib on Eliquis , diastolic CHF was getting routine lab drawn today, his wife was in the waiting room when she was called in, patient had a syncopal event with loss of consciousness, there after some facial droop and abnormal speech was noted as well which gradually resolved as he regained consciousness.  Subsequently brought to the ED as a code stroke, ED Course: Hypertensive, labs notable for potassium of 5.7, creatinine 1.9  Hospital Course:  Syncope and collapse -Transient facial droop -Appears to be a syncopal episode, possibly vasovagal  - Borderline orthostatics positive, clonidine  discontinued -Neurology consult appreciated, MRI brain and EEG unremarkable for acute findings - PT OT eval completed, SNF recommended, wife wants rehab, TOC consult - Discharge planning   Paroxysmal atrial fibrillation  - Poor compliance with Eliquis  at baseline, only takes a.m. 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 - Creatinine slightly higher than baseline on admission, now improving, resume Entresto    Hyperkalemia - Now improved, restart Aldactone    Dementia - Resume  Aricept   Discharge Exam: Vitals:   04/09/24 2137 04/10/24 0735  BP: (!) 156/95 (!) 161/89  Pulse: 64 72  Resp: 16   Temp: 98.7 F (37.1 C) 98.2 F (36.8 C)  SpO2: 99% 99%   General exam: Appears calm and comfortable, cognitive deficits, no distress Respiratory system: Clear to auscultation Cardiovascular system: S1 & S2 heard, RRR.  Abd: nondistended, soft and nontender.Normal bowel sounds heard. Central nervous system: Alert and oriented X2. No focal neurological deficits. Extremities: no edema Skin: No rashes Psychiatry: Flat affect  Discharge Instructions   Discharge Instructions     Diet - low sodium heart healthy   Complete by: As directed    Increase activity slowly   Complete by: As directed       Allergies as of 04/10/2024   No Known Allergies      Medication List     STOP taking these medications    cloNIDine  0.1 MG tablet Commonly known as: Catapres    doxazosin  4 MG tablet Commonly known as: CARDURA        TAKE these medications    alfuzosin  10 MG 24 hr tablet Commonly known as: UROXATRAL  Take 1 tablet (10 mg total) by mouth every morning.   apixaban  5 MG Tabs tablet Commonly known as: Eliquis  Take 1 tablet (5 mg total) by mouth 2 (two) times daily.   carvedilol  12.5 MG tablet Commonly known as: COREG  TAKE 1 TABLET(12.5 MG) BY MOUTH TWICE DAILY   cholecalciferol  25 MCG (1000 UNIT) tablet Commonly known as: VITAMIN D3 Take 1 tablet (1,000 Units total) by mouth daily. What changed: when to take this   cycloSPORINE  0.05 % ophthalmic emulsion Commonly known as: RESTASIS  Place 1  drop into both eyes 2 (two) times daily. What changed:  when to take this reasons to take this   donepezil  10 MG tablet Commonly known as: ARICEPT  Take 10 mg by mouth in the morning.   Entresto  24-26 MG Generic drug: sacubitril -valsartan  TAKE 1 TABLET BY MOUTH TWICE DAILY   fluticasone  50 MCG/ACT nasal spray Commonly known as: FLONASE  Place 2 sprays  into both nostrils daily. What changed:  when to take this reasons to take this   hydrochlorothiazide  25 MG tablet Commonly known as: HYDRODIURIL  TAKE 1 TABLET(25 MG) BY MOUTH DAILY What changed: See the new instructions.   latanoprost  0.005 % ophthalmic solution Commonly known as: XALATAN  Place 1 drop into both eyes at bedtime. What changed:  when to take this reasons to take this   levocetirizine 5 MG tablet Commonly known as: XYZAL  TAKE 1 TABLET(5 MG) BY MOUTH EVERY EVENING   Lubricant Eye Drops 0.4-0.3 % Soln Generic drug: Polyethyl Glycol-Propyl Glycol Place 1 drop into both eyes 3 (three) times daily as needed (dry/irritated eyes.).   mirabegron  ER 50 MG Tb24 tablet Commonly known as: Myrbetriq  Take 1 tablet (50 mg total) by mouth every morning.   multivitamin with minerals Tabs tablet Take 1 tablet by mouth in the morning.   OMEGA 3 PO Take 1 tablet by mouth in the morning.   PROSTATE HEALTH PO Take 1 capsule by mouth in the morning. Super Beta Prostate   rosuvastatin  20 MG tablet Commonly known as: Crestor  Take 1 tablet (20 mg total) by mouth daily. What changed: when to take this   spironolactone  25 MG tablet Commonly known as: ALDACTONE  Take 0.5 tablets (12.5 mg total) by mouth daily. What changed: when to take this   UNABLE TO FIND Take 1 capsule by mouth in the morning. OTC Brain Health Medication       No Known Allergies    The results of significant diagnostics from this hospitalization (including imaging, microbiology, ancillary and laboratory) are listed below for reference.    Significant Diagnostic Studies: VAS US  CAROTID (at [facility] and [facility] only) Result Date: 04/07/2024 Carotid Arterial Duplex Study Patient Name:  Joseph [name] [facility]  Date of Exam:   04/07/2024 Medical Rec #: [id number]          Accession #:    [id number] Date of Birth: 11-21-1945           Patient Gender: M Patient Age:   80 years Exam Location:  [facility] Procedure:       VAS US  CAROTID Referring Phys: SIGURD FAIRY --------------------------------------------------------------------------------  Indications: CVA. Performing Technologist: Ezzie Potters RVT, RDMS  Examination Guidelines: A complete evaluation includes B-mode imaging, spectral Doppler, color Doppler, and power Doppler as needed of all accessible portions of each vessel. Bilateral testing is considered an integral part of a complete examination. Limited examinations for reoccurring indications may be performed as noted.  Right Carotid Findings: +----------+--------+--------+--------+------------------+------------------+           PSV cm/sEDV cm/sStenosisPlaque DescriptionComments           +----------+--------+--------+--------+------------------+------------------+ CCA Prox  74      6                                 intimal thickening +----------+--------+--------+--------+------------------+------------------+ CCA Distal51      12  intimal thickening +----------+--------+--------+--------+------------------+------------------+ ICA Prox  33      9                                 intimal thickening +----------+--------+--------+--------+------------------+------------------+ ICA Distal53      17                                                   +----------+--------+--------+--------+------------------+------------------+ ECA       69      6                                                    +----------+--------+--------+--------+------------------+------------------+ +----------+--------+-------+----------------+-------------------+           PSV cm/sEDV cmsDescribe        Arm Pressure (mmHG) +----------+--------+-------+----------------+-------------------+ [id number]            Multiphasic, WNL                    +----------+--------+-------+----------------+-------------------+ +---------+--------+--+--------+-+---------+  VertebralPSV cm/s25EDV cm/s7Antegrade +---------+--------+--+--------+-+---------+  Left Carotid Findings: +----------+--------+--------+--------+------------------+------------------+           PSV cm/sEDV cm/sStenosisPlaque DescriptionComments           +----------+--------+--------+--------+------------------+------------------+ CCA Prox  56      9               hypoechoic        intimal thickening +----------+--------+--------+--------+------------------+------------------+ CCA Distal64      11                                intimal thickening +----------+--------+--------+--------+------------------+------------------+ ICA Prox  29      9                                                    +----------+--------+--------+--------+------------------+------------------+ ICA Distal61      21                                                   +----------+--------+--------+--------+------------------+------------------+ ECA       79      10                                                   +----------+--------+--------+--------+------------------+------------------+ +----------+--------+--------+----------------+-------------------+           PSV cm/sEDV cm/sDescribe        Arm Pressure (mmHG) +----------+--------+--------+----------------+-------------------+ [id number]             Multiphasic, WNL                    +----------+--------+--------+----------------+-------------------+ +---------+--------+--+--------+-+---------+ VertebralPSV cm/s26EDV cm/s7Antegrade +---------+--------+--+--------+-+---------+   Summary: Right  Carotid: The extracranial vessels were near-normal with only minimal wall                thickening or plaque. Left Carotid: The extracranial vessels were near-normal with only minimal wall               thickening or plaque. Vertebrals:  Bilateral vertebral arteries demonstrate antegrade flow. Subclavians: Normal flow  hemodynamics were seen in bilateral subclavian              arteries. *See table(s) above for measurements and observations.  Electronically signed by Eather Popp MD on 04/07/2024 at 5:00:08 PM.    Final    MR BRAIN WO CONTRAST Result Date: 04/07/2024 CLINICAL DATA:  Provided history: Neuro deficit, acute, stroke suspected. EXAM: MRI HEAD WITHOUT CONTRAST TECHNIQUE: Multiplanar, multiecho pulse sequences of the brain and surrounding structures were obtained without intravenous contrast. COMPARISON:  Non-contrast head CT and CT angiogram head/neck 04/06/2024. Brain MRI 06/15/2023. FINDINGS: Intermittently motion degraded examination. Most notably, the sagittal T1 sequence is severely motion degraded and the coronal T2 sequence is moderately motion degraded. Within this limitation, findings are as follows. Brain: Generalized cerebral atrophy. Numerous chronic lacunar infarcts within the bilateral cerebral hemispheric white matter, within/about the bilateral deep gray nuclei, within the right aspect of the pons, within the right middle cerebellar peduncle and left cerebellar hemisphere. Several lacunar infarcts are new as compared to the MRI of 06/15/2023. For instance, bilateral thalamic lacunar infarcts have progressed in number. Multifocal T2 FLAIR hyperintense signal abnormality elsewhere within the cerebral white matter and pons, nonspecific but compatible with chronic small vessel ischemic disease. Numerous chronic microhemorrhages scattered within the supratentorial and infratentorial brain, similar to the prior MRI There is no acute infarct. No evidence of an intracranial mass. No extra-axial fluid collection. No midline shift. Vascular: Maintained flow voids within the proximal large arterial vessels. Skull and upper cervical spine: No focal worrisome marrow lesion. Sinuses/Orbits: No mass or acute finding within the imaged orbits. No significant paranasal sinus disease. IMPRESSION: 1. Intermittently motion  degraded examination. 2. No evidence of an acute intracranial abnormality. The diffusion-weighted imaging is of good quality and there is no evidence of an acute infarct. 3. Parenchymal atrophy and advanced chronic small vessel ischemic disease (with numerous chronic lacunar infarcts and chronic microhemorrhages) as described. Electronically Signed   By: Rockey Childs D.O.   On: 04/07/2024 13:49   ECHOCARDIOGRAM COMPLETE Result Date: 04/07/2024    ECHOCARDIOGRAM REPORT   Patient Name:   Joseph [name] Date of Exam: 04/07/2024 Medical Rec #:  [id number]         Height:       69.0 in Accession #:    [id number]        Weight:       228.8 lb Date of Birth:  January 31, 1946          BSA:          2.188 m Patient Age:    78 years          BP:           158/82 mmHg Patient Gender: M                 HR:           68 bpm. Exam Location:  Inpatient Procedure: 2D Echo, Cardiac Doppler, Color Doppler and Intracardiac            Opacification Agent (Both Spectral and Color Flow Doppler  were            utilized during procedure). Indications:    Stroke  History:        Patient has prior history of Echocardiogram examinations, most                 recent 02/04/2024. Cardiomyopathy and CHF, Abnormal ECG and                 Pacemaker, Stroke, Arrythmias:RBBB, Bradycardia, Atrial                 Fibrillation and Atrial Flutter, Signs/Symptoms:Syncope; Risk                 Factors:Hypertension.  Sonographer:    Ellouise Mose RDCS Referring Phys: FAIRY FRAMES  Sonographer Comments: Technically difficult study due to poor echo windows, Technically challenging study due to limited acoustic windows, suboptimal apical window and no subcostal window. Image acquisition challenging due to patient body habitus. Suboptimal study despite attempt to turn patient. IMPRESSIONS  1. Left ventricular ejection fraction, by estimation, is 40 to 45%. The left ventricle has mildly decreased function. The left ventricle demonstrates regional wall motion  abnormalities (see scoring diagram/findings for description). There is mild left ventricular hypertrophy. Left ventricular diastolic parameters are consistent with Grade I diastolic dysfunction (impaired relaxation).  2. Right ventricular systolic function is normal. The right ventricular size is normal. Tricuspid regurgitation signal is inadequate for assessing PA pressure.  3. The mitral valve is normal in structure. No evidence of mitral valve regurgitation. No evidence of mitral stenosis.  4. The aortic valve is normal in structure. Aortic valve regurgitation is not visualized. No aortic stenosis is present.  5. The inferior vena cava is normal in size with greater than 50% respiratory variability, suggesting right atrial pressure of 3 mmHg. Conclusion(s)/Recommendation(s): No intracardiac source of embolism detected on this transthoracic study. Consider a transesophageal echocardiogram to exclude cardiac source of embolism if clinically indicated. FINDINGS  Left Ventricle: Left ventricular ejection fraction, by estimation, is 40 to 45%. The left ventricle has mildly decreased function. The left ventricle demonstrates regional wall motion abnormalities. The left ventricular internal cavity size was normal in size. There is mild left ventricular hypertrophy. Left ventricular diastolic parameters are consistent with Grade I diastolic dysfunction (impaired relaxation).  LV Wall Scoring: The posterior wall is akinetic. Right Ventricle: The right ventricular size is normal. No increase in right ventricular wall thickness. Right ventricular systolic function is normal. Tricuspid regurgitation signal is inadequate for assessing PA pressure. Left Atrium: Left atrial size was normal in size. Right Atrium: Right atrial size was normal in size. Pericardium: There is no evidence of pericardial effusion. Mitral Valve: The mitral valve is normal in structure. No evidence of mitral valve regurgitation. No evidence of mitral  valve stenosis. Tricuspid Valve: The tricuspid valve is normal in structure. Tricuspid valve regurgitation is not demonstrated. No evidence of tricuspid stenosis. Aortic Valve: The aortic valve is normal in structure. Aortic valve regurgitation is not visualized. No aortic stenosis is present. Pulmonic Valve: The pulmonic valve was normal in structure. Pulmonic valve regurgitation is not visualized. No evidence of pulmonic stenosis. Aorta: The aortic root is normal in size and structure. Venous: The inferior vena cava is normal in size with greater than 50% respiratory variability, suggesting right atrial pressure of 3 mmHg. IAS/Shunts: No atrial level shunt detected by color flow Doppler.  LEFT VENTRICLE PLAX 2D LVIDd:         4.90 cm  Diastology LVIDs:         3.40 cm      LV e' medial:    5.66 cm/s LV PW:         1.30 cm      LV E/e' medial:  9.0 LV IVS:        1.30 cm      LV e' lateral:   7.07 cm/s LVOT diam:     2.10 cm      LV E/e' lateral: 7.2 LV SV:         53 LV SV Index:   24 LVOT Area:     3.46 cm  LV Volumes (MOD) LV vol d, MOD A2C: 68.2 ml LV vol d, MOD A4C: 153.0 ml LV vol s, MOD A2C: 41.3 ml LV vol s, MOD A4C: 78.3 ml LV SV MOD A2C:     26.9 ml LV SV MOD A4C:     153.0 ml LV SV MOD BP:      45.6 ml RIGHT VENTRICLE            IVC RV S prime:     7.07 cm/s  IVC diam: 1.80 cm TAPSE (M-mode): 1.3 cm LEFT ATRIUM             Index        RIGHT ATRIUM           Index LA diam:        3.50 cm 1.60 cm/m   RA Area:     12.20 cm LA Vol (A2C):   25.9 ml 11.84 ml/m  RA Volume:   27.60 ml  12.62 ml/m LA Vol (A4C):   40.0 ml 18.28 ml/m LA Biplane Vol: 33.2 ml 15.18 ml/m  AORTIC VALVE LVOT Vmax:   79.70 cm/s LVOT Vmean:  52.400 cm/s LVOT VTI:    0.152 m  AORTA Ao Root diam: 3.30 cm Ao Asc diam:  3.20 cm MITRAL VALVE MV Area (PHT): 3.27 cm    SHUNTS MV Decel Time: 232 msec    Systemic VTI:  0.15 m MV E velocity: 50.80 cm/s  Systemic Diam: 2.10 cm MV A velocity: 74.40 cm/s MV E/A ratio:  0.68 Oneil Parchment MD  Electronically signed by Oneil Parchment MD Signature Date/Time: 04/07/2024/1:07:17 PM    Final    EEG adult Result Date: 04/07/2024 Shelton Arlin KIDD, MD     04/07/2024  5:00 PM Patient Name: Joseph [name] MRN: [id number] Epilepsy Attending: Arlin KIDD Shelton Referring Physician/Provider: Michaela Aisha SQUIBB, MD Date: 04/07/2024 Duration: 22.10 mins Patient history: 78 y.o. male with a history of dementia who presents with sudden mental status change in the setting of venipuncture. EEG to evaluate for seizure Level of alertness: Awake AEDs during EEG study: None Technical aspects: This EEG study was done with scalp electrodes positioned according to the 10-20 International system of electrode placement. Electrical activity was reviewed with band pass filter of 1-70Hz , sensitivity of 7 uV/mm, display speed of 66mm/sec with a 60Hz  notched filter applied as appropriate. EEG data were recorded continuously and digitally stored.  Video monitoring was available and reviewed as appropriate. Description: The posterior dominant rhythm consists of 8 Hz activity of moderate voltage (25-35 uV) seen predominantly in posterior head regions, symmetric and reactive to eye opening and eye closing. Hyperventilation and photic stimulation were not performed.   IMPRESSION: This study is within normal limits. No seizures or epileptiform discharges were seen throughout the recording. A normal interictal EEG does not  exclude the diagnosis of epilepsy. Priyanka MALVA Krebs   CT ANGIO HEAD NECK W WO CM (CODE STROKE) Result Date: 04/06/2024 CLINICAL DATA:  Neuro deficit, acute, stroke suspected. Right-sided weakness and facial droop. Altered mental status. EXAM: CT ANGIOGRAPHY HEAD AND NECK WITH AND WITHOUT CONTRAST TECHNIQUE: Multidetector CT imaging of the head and neck was performed using the standard protocol during bolus administration of intravenous contrast. Multiplanar CT image reconstructions and MIPs were obtained to evaluate the  vascular anatomy. Carotid stenosis measurements (when applicable) are obtained utilizing NASCET criteria, using the distal internal carotid diameter as the denominator. RADIATION DOSE REDUCTION: This exam was performed according to the departmental dose-optimization program which includes automated exposure control, adjustment of the mA and/or kV according to patient size and/or use of iterative reconstruction technique. CONTRAST:  75mL OMNIPAQUE  IOHEXOL  350 MG/ML SOLN COMPARISON:  None Available. FINDINGS: CTA NECK FINDINGS Aortic arch: Normal variant aortic arch branching pattern with common origin of the brachiocephalic and left common carotid arteries. Mild atherosclerotic calcification in the aortic arch and proximal subclavian arteries. Widely patent brachiocephalic and subclavian arteries. Right carotid system: Patent without evidence of stenosis or dissection. Left carotid system: Patent without evidence of stenosis or dissection. Small amount of nonstenotic calcified plaque in the proximal ICA. Vertebral arteries: Patent with the right being mildly dominant. Minimal atherosclerosis at the vertebral origins. No evidence of a significant stenosis or dissection. Skeleton: No acute osseous abnormality or suspicious lesion. Other neck: Asymmetrically enlarged and heterogeneous left thyroid  lobe with a potential 4 cm nodule centered inferiorly with assessment limited by shoulder artifact. No evidence of cervical lymphadenopathy. Upper chest: Clear lung apices. Review of the MIP images confirms the above findings CTA HEAD FINDINGS Anterior circulation: The internal carotid arteries are patent from skull base to carotid termini with calcified plaque resulting in mild stenosis on the right at the level of the anterior genu. An aneurysm projecting posteriorly and inferiorly from the right supraclinoid ICA measures 6 x 3 mm. ACAs and MCAs are patent without evidence of a proximal branch occlusion or significant  proximal stenosis. Posterior circulation: The intracranial vertebral arteries are widely patent to the basilar. Patent PICA, AICA, and SCA origins are visualized bilaterally. The basilar artery is widely patent. There are small left and diminutive or absent/occluded right posterior communicating arteries. Both PCAs are patent without evidence of a significant proximal stenosis. No aneurysm is identified. Venous sinuses: As permitted by contrast timing, patent. Anatomic variants: None. Review of the MIP images confirms the above findings The finding of no large vessel occlusion was communicated to Dr. Michaela at 12:14 pm on 04/06/2024 by text page via the [facility] messaging system. IMPRESSION: 1. No large vessel occlusion. 2. Mild intracranial right ICA stenosis. 3. 6 mm right supraclinoid ICA aneurysm. 4. Widely patent cervical carotid and vertebral arteries. 5. Possible 4 cm left thyroid  nodule. Recommend non-emergent thyroid  ultrasound. Reference: J Am Coll Radiol. 2015 Feb;12(2): 143-50 6.  Aortic Atherosclerosis (ICD10-I70.0). Electronically Signed   By: Dasie Hamburg M.D.   On: 04/06/2024 12:25    Microbiology: No results found for this or any previous visit (from the past 240 hours).   Labs: Basic Metabolic Panel: Recent Labs  Lab 04/06/24 1456 04/07/24 0530 04/08/24 0147 04/09/24 0436 04/10/24 0208  NA 137 136 136 139 135  K 4.7 4.0 3.8 4.3 4.0  CL 101 103 104 106 106  CO2 25 20* 23 22 22   GLUCOSE 102* 99 95 101* 110*  BUN 22 28* 21  25* 24*  CREATININE 1.58* 1.60* 1.31* 1.54* 1.59*  CALCIUM  9.0 8.6* 8.6* 8.7* 8.6*   Liver Function Tests: Recent Labs  Lab 04/06/24 1456 04/07/24 0530  AST 23 17  ALT 15 12  ALKPHOS 61 52  BILITOT 1.5* 1.4*  PROT 6.7 6.0*  ALBUMIN 3.6 3.1*   No results for input(s): LIPASE, AMYLASE in the last 168 hours. No results for input(s): AMMONIA in the last 168 hours. CBC: Recent Labs  Lab 04/06/24 1159 04/06/24 1207 04/07/24 0530  WBC 3.8*   --  5.6  NEUTROABS 2.1  --   --   HGB 12.8* 14.3 12.1*  HCT 41.6 42.0 38.8*  MCV 70.9*  --  70.4*  PLT 113*  --  179   Cardiac Enzymes: No results for input(s): CKTOTAL, CKMB, CKMBINDEX, TROPONINI in the last 168 hours. BNP: BNP (last 3 results) No results for input(s): BNP in the last 8760 hours.  ProBNP (last 3 results) No results for input(s): PROBNP in the last 8760 hours.  CBG: Recent Labs  Lab 04/06/24 1118  GLUCAP 118*       Signed:  Sigurd Pac MD.  [facility] 04/10/2024, 9:45 AM

## 2024-04-07 NOTE — Care Management Obs Status (Signed)
 MEDICARE OBSERVATION STATUS NOTIFICATION   Patient Details  Name: Joseph [name] MRN: [id number] Date of Birth: Dec 06, 1945   Medicare Observation Status Notification Given:  Yes  verbally reviewed observation notice with [name] telephonically at [phone].  As per wife send copy to home address   Claretta Deed 04/07/2024, 3:03 PM

## 2024-04-07 NOTE — Progress Notes (Signed)
 Arrived to do EEG.  Echo in room starting testing at this time.  We attempt at later time

## 2024-04-07 NOTE — Progress Notes (Signed)
 OT Cancellation Note  Patient Details Name: Joseph [name] MRN: [id number] DOB: [birth year]   Cancelled Treatment:    Reason Eval/Treat Not Completed: Patient at procedure or test/ unavailable. OT will follow up next available time   Jacques Karna Loose 04/07/2024, 11:11 AM

## 2024-04-07 NOTE — Progress Notes (Signed)
  Echocardiogram 2D Echocardiogram has been performed.  Joseph [name] 04/07/2024, 11:20 AM

## 2024-04-07 NOTE — Progress Notes (Signed)
 PT Cancellation Note  Patient Details Name: Joseph [name] MRN: [id number] DOB: [birth year]   Cancelled Treatment:    Reason Eval/Treat Not Completed: Patient at procedure or test/unavailable.  Will see later as able. 04/07/2024  [name], PT Acute Rehabilitation Services [phone]  (office)   [name] Dawnya Grams 04/07/2024, 1:01 PM

## 2024-04-07 NOTE — Plan of Care (Signed)

## 2024-04-08 DIAGNOSIS — R55 Syncope and collapse: Secondary | ICD-10-CM | POA: Diagnosis not present

## 2024-04-08 LAB — BASIC METABOLIC PANEL WITH GFR
Anion gap: 9 (ref 5–15)
BUN: 21 mg/dL (ref 8–23)
CO2: 23 mmol/L (ref 22–32)
Calcium: 8.6 mg/dL — ABNORMAL LOW (ref 8.9–10.3)
Chloride: 104 mmol/L (ref 98–111)
Creatinine, Ser: 1.31 mg/dL — ABNORMAL HIGH (ref 0.61–1.24)
GFR, Estimated: 56 mL/min — ABNORMAL LOW (ref >60)
Glucose, Bld: 95 mg/dL (ref 70–99)
Potassium: 3.8 mmol/L (ref 3.5–5.1)
Sodium: 136 mmol/L (ref 135–145)

## 2024-04-08 MED ORDER — SACUBITRIL-VALSARTAN 24-26 MG PO TABS
1.0000 | ORAL_TABLET | Freq: Two times a day (BID) | ORAL | Status: DC
  Administered 2024-04-08 – 2024-04-13 (×10): 1 via ORAL
  Filled 2024-04-08 (×10): qty 1

## 2024-04-08 NOTE — Plan of Care (Signed)
 ?  Problem: Clinical Measurements: ?Goal: Ability to maintain clinical measurements within normal limits will improve ?Outcome: Progressing ?Goal: Will remain free from infection ?Outcome: Progressing ?Goal: Diagnostic test results will improve ?Outcome: Progressing ?  ?

## 2024-04-08 NOTE — Progress Notes (Signed)
   04/08/24 1100  Orthostatic Lying   BP- Lying  (pt on EOB on arrival)  Orthostatic Sitting  BP- Sitting (!) 160/96  Pulse- Sitting 81  Orthostatic Standing at 0 minutes  BP- Standing at 0 minutes (!) 123/101  Pulse- Standing at 0 minutes 98  Orthostatic Standing at 3 minutes  BP- Standing at 3 minutes (!) 173/106  Pulse- Standing at 3 minutes 107    BP fluctuations as above.   Marialuisa Basara M,PT Acute Rehab Services [phone]

## 2024-04-08 NOTE — Progress Notes (Signed)
 PROGRESS NOTE    Joseph Joseph [name]  FMW:[id number] DOB: 09-08-1945 DOA: 04/06/2024 PCP: Joseph Wilhelmena Lloyd Hilario, FNP   Joseph Joseph [name] is a 78/M with history of prior CVA, dementia, paroxysmal A-fib on Eliquis , diastolic CHF was getting routine lab drawn today, his wife was in the waiting room when she was called in, patient had a syncopal event with loss of consciousness, there after some facial droop and abnormal speech was noted as well which gradually resolved as he regained consciousness.  Subsequently brought to the ED as a code stroke, ED Course: Hypertensive, labs notable for potassium of 5.7, creatinine 1.9  Subjective: - Confusion yesterday evening, wife concerned about balance,  Assessment and Plan:   Syncope and collapse ?  Transient facial droop -Appears to be a syncopal episode, possibly vasovagal  - Borderline orthostatics positive, clonidine  discontinued -Neurology consult appreciated, MRI brain and EEG unremarkable for acute findings - PT OT eval completed, SNF recommended, wife wants rehab, TOC consult  Paroxysmal atrial fibrillation  - Poor compliance with Eliquis  at baseline, only takes a.m. 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 - Creatinine slightly higher than baseline, holding Entresto  Hyperkalemia - Now improving, restart Aldactone    Dementia - Resume Aricept       DVT prophylaxis: Lovenox Code Status: Full code Family Communication: Discussed with wife at bedside Disposition Plan: SNF for short-term rehab  Consultants:    Procedures:   Antimicrobials:    Objective: Vitals:   04/08/24 0120 04/08/24 0444 04/08/24 0855 04/08/24 1200  BP: (!) 145/82 (!) 169/87 (!) 157/72 136/75  Pulse: 60 77 60 71  Resp: 18 17    Temp: 97.6 F (36.4 C) 97.8 F (36.6 C) 97.7 F (36.5 C)   TempSrc:  Oral Oral Oral   SpO2: 100% 100% 99% 98%  Weight:        Intake/Output Summary (Last 24 hours) at 04/08/2024 1349 Last data filed at 04/08/2024 0455 Gross per 24 hour  Intake --  Output 350 ml  Net -350 ml   Filed Weights   04/06/24 1100  Weight: 103.8 kg    Examination:  General exam: Appears calm and comfortable, mild cognitive deficits Respiratory system: Clear to auscultation Cardiovascular system: S1 & S2 heard, RRR.  Abd: nondistended, soft and nontender.Normal bowel sounds heard. Central nervous system: Alert and oriented X2. No focal neurological deficits. Extremities: no edema Skin: No rashes Psychiatry:  Mood & affect appropriate.     Data Reviewed:   CBC: Recent Labs  Lab 04/06/24 1159 04/06/24 1207 04/07/24 0530  WBC 3.8*  --  5.6  NEUTROABS 2.1  --   --   HGB 12.8* 14.3 12.1*  HCT 41.6 42.0 38.8*  MCV 70.9*  --  70.4*  PLT 113*  --  179   Basic Metabolic Panel: Recent Labs  Lab 04/06/24 1207 04/06/24 1456 04/07/24 0530 04/08/24 0147  NA 139 137 136 136  K 5.7* 4.7 4.0 3.8  CL 105 101 103 104  CO2  --  25 20* 23  GLUCOSE 113* 102* 99 95  BUN 33* 22 28* 21  CREATININE 1.90* 1.58* 1.60* 1.31*  CALCIUM   --  9.0 8.6* 8.6*   GFR: Estimated Creatinine Clearance: 56.9 mL/min (A) (by C-G formula based on SCr of 1.31 mg/dL (H)). Liver Function Tests: Recent Labs  Lab 04/06/24 1456  04/07/24 0530  AST 23 17  ALT 15 12  ALKPHOS 61 52  BILITOT 1.5* 1.4*  PROT 6.7 6.0*  ALBUMIN 3.6 3.1*   No results for input(s): LIPASE, AMYLASE in the last 168 hours. No results for input(s): AMMONIA in the last 168 hours. Coagulation Profile: Recent Labs  Lab 04/06/24 1159  INR 1.1   Cardiac Enzymes: No results for input(s): CKTOTAL, CKMB, CKMBINDEX, TROPONINI in the last 168 hours. BNP (last 3 results) No results for input(s): PROBNP in the last 8760 hours. HbA1C: No results for input(s): HGBA1C in the last 72 hours. CBG: Recent  Labs  Lab 04/06/24 1118  GLUCAP 118*   Lipid Profile: Recent Labs    04/07/24 0530  CHOL 139  HDL 41  LDLCALC 81  TRIG 86  CHOLHDL 3.4   Thyroid  Function Tests: No results for input(s): TSH, T4TOTAL, FREET4, T3FREE, THYROIDAB in the last 72 hours. Anemia Panel: No results for input(s): VITAMINB12, FOLATE, FERRITIN, TIBC, IRON, RETICCTPCT in the last 72 hours. Urine analysis:    Component Value Date/Time   COLORURINE YELLOW 04/22/2023 1732   APPEARANCEUR Clear 04/03/2024 1053   LABSPEC 1.017 04/22/2023 1732   PHURINE 5.0 04/22/2023 1732   GLUCOSEU Negative 04/03/2024 1053   HGBUR NEGATIVE 04/22/2023 1732   BILIRUBINUR Negative 04/03/2024 1053   KETONESUR NEGATIVE 04/22/2023 1732   PROTEINUR Negative 04/03/2024 1053   PROTEINUR NEGATIVE 04/22/2023 1732   NITRITE Negative 04/03/2024 1053   NITRITE NEGATIVE 04/22/2023 1732   LEUKOCYTESUR Negative 04/03/2024 1053   LEUKOCYTESUR NEGATIVE 04/22/2023 1732   Sepsis Labs: @LABRCNTIP (procalcitonin:4,lacticidven:4)  )No results found for this or any previous visit (from the past 240 hours).   Radiology Studies: VAS US  CAROTID (at [facility] and [facility] only) Result Date: 04/07/2024 Carotid Arterial Duplex Study Patient Name:  Joseph Joseph [name] [facility]  Date of Exam:   04/07/2024 Medical Rec #: [id number]          Accession #:    [id number] Date of Birth: [birth year]           Patient Gender: M Patient Age:   78 years Exam Location:  [facility] Procedure:      VAS US  CAROTID Referring Phys: Joseph Joseph [name] --------------------------------------------------------------------------------  Indications: CVA. Performing Technologist: Joseph Joseph [name] RVT, RDMS  Examination Guidelines: A complete evaluation includes B-mode imaging, spectral Doppler, color Doppler, and power Doppler as needed of all accessible portions of each vessel. Bilateral testing is considered an integral part of a complete examination. Limited examinations for reoccurring  indications may be performed as noted.  Right Carotid Findings: +----------+--------+--------+--------+------------------+------------------+           PSV cm/sEDV cm/sStenosisPlaque DescriptionComments           +----------+--------+--------+--------+------------------+------------------+ CCA Prox  74      6                                 intimal thickening +----------+--------+--------+--------+------------------+------------------+ CCA Distal51      12                                intimal thickening +----------+--------+--------+--------+------------------+------------------+ ICA Prox  33      9                                 intimal thickening +----------+--------+--------+--------+------------------+------------------+ ICA  Distal53      17                                                   +----------+--------+--------+--------+------------------+------------------+ ECA       69      6                                                    +----------+--------+--------+--------+------------------+------------------+ +----------+--------+-------+----------------+-------------------+           PSV cm/sEDV cmsDescribe        Arm Pressure (mmHG) +----------+--------+-------+----------------+-------------------+ [id number]            Multiphasic, WNL                    +----------+--------+-------+----------------+-------------------+ +---------+--------+--+--------+-+---------+ VertebralPSV cm/s25EDV cm/s7Antegrade +---------+--------+--+--------+-+---------+  Left Carotid Findings: +----------+--------+--------+--------+------------------+------------------+           PSV cm/sEDV cm/sStenosisPlaque DescriptionComments           +----------+--------+--------+--------+------------------+------------------+ CCA Prox  56      9               hypoechoic        intimal thickening  +----------+--------+--------+--------+------------------+------------------+ CCA Distal64      11                                intimal thickening +----------+--------+--------+--------+------------------+------------------+ ICA Prox  29      9                                                    +----------+--------+--------+--------+------------------+------------------+ ICA Distal61      21                                                   +----------+--------+--------+--------+------------------+------------------+ ECA       79      10                                                   +----------+--------+--------+--------+------------------+------------------+ +----------+--------+--------+----------------+-------------------+           PSV cm/sEDV cm/sDescribe        Arm Pressure (mmHG) +----------+--------+--------+----------------+-------------------+ [id number]             Multiphasic, WNL                    +----------+--------+--------+----------------+-------------------+ +---------+--------+--+--------+-+---------+ VertebralPSV cm/s26EDV cm/s7Antegrade +---------+--------+--+--------+-+---------+   Summary: Right Carotid: The extracranial vessels were near-normal with only minimal wall                thickening or plaque. Left Carotid: The extracranial vessels were near-normal with only minimal wall  thickening or plaque. Vertebrals:  Bilateral vertebral arteries demonstrate antegrade flow. Subclavians: Normal flow hemodynamics were seen in bilateral subclavian              arteries. *See table(s) above for measurements and observations.  Electronically signed by Eather Popp MD on 04/07/2024 at 5:00:08 PM.    Final    MR BRAIN WO CONTRAST Result Date: 04/07/2024 CLINICAL DATA:  Provided history: Neuro deficit, acute, stroke suspected. EXAM: MRI HEAD WITHOUT CONTRAST TECHNIQUE: Multiplanar, multiecho pulse sequences of the brain and  surrounding structures were obtained without intravenous contrast. COMPARISON:  Non-contrast head CT and CT angiogram head/neck 04/06/2024. Brain MRI 06/15/2023. FINDINGS: Intermittently motion degraded examination. Most notably, the sagittal T1 sequence is severely motion degraded and the coronal T2 sequence is moderately motion degraded. Within this limitation, findings are as follows. Brain: Generalized cerebral atrophy. Numerous chronic lacunar infarcts within the bilateral cerebral hemispheric white matter, within/about the bilateral deep gray nuclei, within the right aspect of the pons, within the right middle cerebellar peduncle and left cerebellar hemisphere. Several lacunar infarcts are new as compared to the MRI of 06/15/2023. For instance, bilateral thalamic lacunar infarcts have progressed in number. Multifocal T2 FLAIR hyperintense signal abnormality elsewhere within the cerebral white matter and pons, nonspecific but compatible with chronic small vessel ischemic disease. Numerous chronic microhemorrhages scattered within the supratentorial and infratentorial brain, similar to the prior MRI There is no acute infarct. No evidence of an intracranial mass. No extra-axial fluid collection. No midline shift. Vascular: Maintained flow voids within the proximal large arterial vessels. Skull and upper cervical spine: No focal worrisome marrow lesion. Sinuses/Orbits: No mass or acute finding within the imaged orbits. No significant paranasal sinus disease. IMPRESSION: 1. Intermittently motion degraded examination. 2. No evidence of an acute intracranial abnormality. The diffusion-weighted imaging is of good quality and there is no evidence of an acute infarct. 3. Parenchymal atrophy and advanced chronic small vessel ischemic disease (with numerous chronic lacunar infarcts and chronic microhemorrhages) as described. Electronically Signed   By: Rockey Childs D.O.   On: 04/07/2024 13:49   ECHOCARDIOGRAM  COMPLETE Result Date: 04/07/2024    ECHOCARDIOGRAM REPORT   Patient Name:   ULYSESS WITZ Date of Exam: 04/07/2024 Medical Rec #:  [id number]         Height:       69.0 in Accession #:    [id number]        Weight:       228.8 lb Date of Birth:  January 16, 1946          BSA:          2.188 m Patient Age:    78 years          BP:           158/82 mmHg Patient Gender: M                 HR:           68 bpm. Exam Location:  Inpatient Procedure: 2D Echo, Cardiac Doppler, Color Doppler and Intracardiac            Opacification Agent (Both Spectral and Color Flow Doppler were            utilized during procedure). Indications:    Stroke  History:        Patient has prior history of Echocardiogram examinations, most  recent 02/04/2024. Cardiomyopathy and CHF, Abnormal ECG and                 Pacemaker, Stroke, Arrythmias:RBBB, Bradycardia, Atrial                 Fibrillation and Atrial Flutter, Signs/Symptoms:Syncope; Risk                 Factors:Hypertension.  Sonographer:    Ellouise Mose RDCS Referring Phys: Joseph FRAMES  Sonographer Comments: Technically difficult study due to poor echo windows, Technically challenging study due to limited acoustic windows, suboptimal apical window and no subcostal window. Image acquisition challenging due to patient body habitus. Suboptimal study despite attempt to turn patient. IMPRESSIONS  1. Left ventricular ejection fraction, by estimation, is 40 to 45%. The left ventricle has mildly decreased function. The left ventricle demonstrates regional wall motion abnormalities (see scoring diagram/findings for description). There is mild left ventricular hypertrophy. Left ventricular diastolic parameters are consistent with Grade I diastolic dysfunction (impaired relaxation).  2. Right ventricular systolic function is normal. The right ventricular size is normal. Tricuspid regurgitation signal is inadequate for assessing PA pressure.  3. The mitral valve is normal in structure.  No evidence of mitral valve regurgitation. No evidence of mitral stenosis.  4. The aortic valve is normal in structure. Aortic valve regurgitation is not visualized. No aortic stenosis is present.  5. The inferior vena cava is normal in size with greater than 50% respiratory variability, suggesting right atrial pressure of 3 mmHg. Conclusion(s)/Recommendation(s): No intracardiac source of embolism detected on this transthoracic study. Consider a transesophageal echocardiogram to exclude cardiac source of embolism if clinically indicated. FINDINGS  Left Ventricle: Left ventricular ejection fraction, by estimation, is 40 to 45%. The left ventricle has mildly decreased function. The left ventricle demonstrates regional wall motion abnormalities. The left ventricular internal cavity size was normal in size. There is mild left ventricular hypertrophy. Left ventricular diastolic parameters are consistent with Grade I diastolic dysfunction (impaired relaxation).  LV Wall Scoring: The posterior wall is akinetic. Right Ventricle: The right ventricular size is normal. No increase in right ventricular wall thickness. Right ventricular systolic function is normal. Tricuspid regurgitation signal is inadequate for assessing PA pressure. Left Atrium: Left atrial size was normal in size. Right Atrium: Right atrial size was normal in size. Pericardium: There is no evidence of pericardial effusion. Mitral Valve: The mitral valve is normal in structure. No evidence of mitral valve regurgitation. No evidence of mitral valve stenosis. Tricuspid Valve: The tricuspid valve is normal in structure. Tricuspid valve regurgitation is not demonstrated. No evidence of tricuspid stenosis. Aortic Valve: The aortic valve is normal in structure. Aortic valve regurgitation is not visualized. No aortic stenosis is present. Pulmonic Valve: The pulmonic valve was normal in structure. Pulmonic valve regurgitation is not visualized. No evidence of  pulmonic stenosis. Aorta: The aortic root is normal in size and structure. Venous: The inferior vena cava is normal in size with greater than 50% respiratory variability, suggesting right atrial pressure of 3 mmHg. IAS/Shunts: No atrial level shunt detected by color flow Doppler.  LEFT VENTRICLE PLAX 2D LVIDd:         4.90 cm      Diastology LVIDs:         3.40 cm      LV e' medial:    5.66 cm/s LV PW:         1.30 cm      LV E/e' medial:  9.0 LV  IVS:        1.30 cm      LV e' lateral:   7.07 cm/s LVOT diam:     2.10 cm      LV E/e' lateral: 7.2 LV SV:         53 LV SV Index:   24 LVOT Area:     3.46 cm  LV Volumes (MOD) LV vol d, MOD A2C: 68.2 ml LV vol d, MOD A4C: 153.0 ml LV vol s, MOD A2C: 41.3 ml LV vol s, MOD A4C: 78.3 ml LV SV MOD A2C:     26.9 ml LV SV MOD A4C:     153.0 ml LV SV MOD BP:      45.6 ml RIGHT VENTRICLE            IVC RV S prime:     7.07 cm/s  IVC diam: 1.80 cm TAPSE (M-mode): 1.3 cm LEFT ATRIUM             Index        RIGHT ATRIUM           Index LA diam:        3.50 cm 1.60 cm/m   RA Area:     12.20 cm LA Vol (A2C):   25.9 ml 11.84 ml/m  RA Volume:   27.60 ml  12.62 ml/m LA Vol (A4C):   40.0 ml 18.28 ml/m LA Biplane Vol: 33.2 ml 15.18 ml/m  AORTIC VALVE LVOT Vmax:   79.70 cm/s LVOT Vmean:  52.400 cm/s LVOT VTI:    0.152 m  AORTA Ao Root diam: 3.30 cm Ao Asc diam:  3.20 cm MITRAL VALVE MV Area (PHT): 3.27 cm    SHUNTS MV Decel Time: 232 msec    Systemic VTI:  0.15 m MV E velocity: 50.80 cm/s  Systemic Diam: 2.10 cm MV A velocity: 74.40 cm/s MV E/A ratio:  0.68 Oneil Parchment MD Electronically signed by Oneil Parchment MD Signature Date/Time: 04/07/2024/1:07:17 PM    Final    EEG adult Result Date: 04/07/2024 Shelton Arlin KIDD, MD     04/07/2024  5:00 PM Patient Name: ARAGORN RECKER MRN: [id number] Epilepsy Attending: Arlin KIDD Shelton Referring Physician/Provider: Michaela Aisha SQUIBB, MD Date: 04/07/2024 Duration: 22.10 mins Patient history: 78 y.o. male with a history of dementia who  presents with sudden mental status change in the setting of venipuncture. EEG to evaluate for seizure Level of alertness: Awake AEDs during EEG study: None Technical aspects: This EEG study was done with scalp electrodes positioned according to the 10-20 International system of electrode placement. Electrical activity was reviewed with band pass filter of 1-70Hz , sensitivity of 7 uV/mm, display speed of 69mm/sec with a 60Hz  notched filter applied as appropriate. EEG data were recorded continuously and digitally stored.  Video monitoring was available and reviewed as appropriate. Description: The posterior dominant rhythm consists of 8 Hz activity of moderate voltage (25-35 uV) seen predominantly in posterior head regions, symmetric and reactive to eye opening and eye closing. Hyperventilation and photic stimulation were not performed.   IMPRESSION: This study is within normal limits. No seizures or epileptiform discharges were seen throughout the recording. A normal interictal EEG does not exclude the diagnosis of epilepsy. Priyanka O Yadav     Scheduled Meds:  alfuzosin   10 mg Oral q morning   apixaban   5 mg Oral BID   carvedilol   3.125 mg Oral BID WC   cycloSPORINE   1 drop Both Eyes BID  donepezil   10 mg Oral Daily   latanoprost   1 drop Both Eyes QHS   mirabegron  ER  50 mg Oral q morning   rosuvastatin   20 mg Oral Daily   sodium chloride  flush  3 mL Intravenous Once   spironolactone   25 mg Oral Daily   Continuous Infusions:     LOS: 0 days    Time spent:    Joseph Pac, MD [facility]   04/08/2024, 1:49 PM

## 2024-04-08 NOTE — Evaluation (Signed)
 Occupational Therapy Evaluation Patient Details Name: Joseph [name] MRN: [id number] DOB: 08-04-1945 Today's Date: 04/08/2024   History of Present Illness   Joseph [name] is a 78/M admitted 9/25 after pt was getting routine lab drawn today, his wife was in the waiting room when she was called in, patient had a syncopal event with loss of consciousness, some facial droop and abnormal speech was noted as well which gradually resolved as he regained consciousness.  Subsequently brought to the ED as a code stroke.  PAF,CHF, hyperkalemia. PMH:  prior CVA, dementia, paroxysmal A-fib on Eliquis , diastolic CHF     Clinical Impressions At baseline, pt is Ind to Mod I with ADLs and ambulating without an AD. Pt with a diagnosis of dementia at baseline, but with pt's wife reporting pt is not currently at baseline cognitive level. Pt now presents with decreased activity tolerance, generalized B UE weakness, decreased B UE coordination (worse on Left), decreased L UE proprioception, decreased cognition, decreased balance, impaired cardiopulmonary status, and decreased safety and independence with functional tasks. Pt currently demonstrates ability to largely complete ADLs with Mod I to Max assist of +1 to +2 for safety, bed mobility with Mod assist, and functional transfers with a RW with Min assist +2 for safety. Further mobility deferred this day due to pt having positive orthostatics during this session. Pt will benefit from acute OT services to address deficits and increase safety and independence with functional tasks. Post acute discharge, pt will benefit from intensive inpatient skilled rehab services < 3 hours per day to maximize rehab potential.      04/08/24 1100    Orthostatic Lying   BP- Lying   (pt on EOB on arrival)  Orthostatic Sitting  BP- Sitting (!) 160/96  Pulse- Sitting 81  Orthostatic Standing at 0 minutes  BP- Standing at 0 minutes (!) 123/101  Pulse- Standing at 0 minutes 98   Orthostatic Standing at 3 minutes  BP- Standing at 3 minutes (!) 173/106  Pulse- Standing at 3 minutes 107        If plan is discharge home, recommend the following:   A little help with walking and/or transfers;A lot of help with bathing/dressing/bathroom;Assistance with cooking/housework;Direct supervision/assist for financial management;Assist for transportation;Help with stairs or ramp for entrance;Direct supervision/assist for medications management (Set up for self feeding)     Functional Status Assessment   Patient has had a recent decline in their functional status and demonstrates the ability to make significant improvements in function in a reasonable and predictable amount of time.     Equipment Recommendations   BSC/3in1;Other (comment) (Other TBD based on pt progress)     Recommendations for Other Services         Precautions/Restrictions   Precautions Precautions: Fall;Other (comment) Precaution/Restrictions Comments: watch BP Restrictions Weight Bearing Restrictions Per Provider Order: No     Mobility Bed Mobility Overal bed mobility: Needs Assistance Bed Mobility: Sit to Supine       Sit to supine: Mod assist   General bed mobility comments: Pt sitting on EOB on arrival with wife present and getting ready to get to 3N1 to have BM.  at end of session, pt needing assist to bring LEs into bed and to get settled in bed in chair position.    Transfers Overall transfer level: Needs assistance Equipment used: Rolling walker (2 wheels), 2 person hand held assist Transfers: Sit to/from Stand, Bed to chair/wheelchair/BSC Sit to Stand: Min assist, From elevated surface, +2  safety/equipment     Step pivot transfers: Min assist, +2 safety/equipment     General transfer comment: Pt needed min + 2 assist to step pivot to the 3N1 with use of RW with pt needing cues and assist for safety. If pt is on feet more than 20 seconds, his LEs have shakiness  and he needs min assist for stability. Pt was able to get to 3N1 with assist to have BM. Orthostatics positive with notes sent to MD and OT/PT assisting pt to supine instead of walking today.      Balance Overall balance assessment: Needs assistance Sitting-balance support: Single extremity supported, Bilateral upper extremity supported, No upper extremity supported, Feet supported Sitting balance-Leahy Scale: Fair     Standing balance support: Single extremity supported, Bilateral upper extremity supported, During functional activity, Reliant on assistive device for balance Standing balance-Leahy Scale: Poor Standing balance comment: Reliant on UE support and requiring up to Min assist to maintain balance in standing with use of RW due to pt reported dizziness and pt's B LE beginning to shake with extended standing                           ADL either performed or assessed with clinical judgement   ADL Overall ADL's : Needs assistance/impaired Eating/Feeding: Set up;Sitting   Grooming: Set up;Supervision/safety;Sitting;Cueing for sequencing   Upper Body Bathing: Minimal assistance;Sitting;Cueing for sequencing   Lower Body Bathing: Moderate assistance;Sitting/lateral leans;Sit to/from stand;Cueing for sequencing;Cueing for compensatory techniques;Cueing for safety;+2 for safety/equipment   Upper Body Dressing : Contact guard assist;Sitting;Cueing for sequencing   Lower Body Dressing: Moderate assistance;Maximal assistance;Cueing for safety;Cueing for compensatory techniques;Cueing for sequencing;Sitting/lateral leans;Sit to/from stand;+2 for safety/equipment   Toilet Transfer: Minimal assistance;Rolling walker (2 wheels);BSC/3in1;Cueing for safety;Cueing for sequencing;+2 for safety/equipment (step-pivot transfer)   Toileting- Clothing Manipulation and Hygiene: Minimal assistance;Moderate assistance;+2 for safety/equipment;Cueing for sequencing;Sit to/from stand        Functional mobility during ADLs:  (deferred this session for pt/therapist safety due to pt with positive orthostatics this session) General ADL Comments: Pt with decreased activity tolerance and with positive orthostattics this session affecting functional level.     Vision Patient Visual Report: No change from baseline Additional Comments: [facility] for tasks assessed with pt reporting no change from baseline; not formally screened or assessed     Perception         Praxis         Pertinent Vitals/Pain Pain Assessment Pain Assessment: No/denies pain     Extremity/Trunk Assessment Upper Extremity Assessment Upper Extremity Assessment: Right hand dominant;Generalized weakness;RUE deficits/detail;LUE deficits/detail RUE Deficits / Details: generalized weakness; decreased coordination (worse on Left); ROM WFL RUE Coordination: decreased fine motor;decreased gross motor LUE Deficits / Details: generalized weakness; decreased coordination (worse on Left); ROM WFL but with pt requiring increased effort and time for movement and with pt reporting moving L UE felt difficult; decreased proprioception LUE Sensation: decreased proprioception LUE Coordination: decreased fine motor;decreased gross motor   Lower Extremity Assessment Lower Extremity Assessment: Defer to PT evaluation RLE Deficits / Details: hip 3/5, knee 4/5, ankle 3/5 LLE Deficits / Details: hip 3/5, knee 4/5, ankle 3/5   Cervical / Trunk Assessment Cervical / Trunk Assessment: Kyphotic   Communication Communication Communication: Impaired Factors Affecting Communication: Difficulty expressing self (mild difficulty with word finding; requires increased time for processing)   Cognition Arousal: Alert Behavior During Therapy: Flat affect Cognition: Cognition impaired, History of cognitive impairments  Awareness: Intellectual awareness intact, Online awareness impaired Memory impairment (select all impairments):  Short-term memory, Working [employer] fast streamer, Engineer, structural memory Attention impairment (select first level of impairment): Selective attention Executive functioning impairment (select all impairments): Organization, Sequencing, Reasoning, Problem solving OT - Cognition Comments: Pt with diagnosis of dementia atbaseline. However, pt's wife reports he is not at his baseline cognitive level and reports pt has had a significant change in cognition over the past few days.                 Following commands: Impaired Following commands impaired: Only follows one step commands consistently, Follows multi-step commands inconsistently, Follows multi-step commands with increased time     Cueing  General Comments   Cueing Techniques: Verbal cues;Gestural cues;Visual cues  See orthostatic VS on Flowsheet. Pt's wife present and supportive throughout session.   Exercises     Shoulder Instructions      Home Living Family/patient expects to be discharged to:: Private residence Living Arrangements: Spouse/significant other Available Help at Discharge: Family;Available 24 hours/day Type of Home: House Home Access: Stairs to enter [employer] of Steps: 1 + 1   Home Layout: Laundry or work area in basement;Able to live on main level with bedroom/bathroom     Bathroom Shower/Tub: Chief Strategy Officer: Handicapped height     Home Equipment: None          Prior Functioning/Environment Prior Level of Function : Independent/Modified Independent             Mobility Comments: was able to ambulate without an AD ADLs Comments: Ind to Mod for ADLs. Pt's wife engages him in IADLs as much as possible (ex. pt will carry the garbage out). Wife manages all other IADLs, including transportation. Pt reports he likes learning about new things, building/assembing things, and watching TV.    OT Problem List: Decreased strength;Decreased activity tolerance;Impaired balance  (sitting and/or standing);Decreased coordination;Decreased cognition;Decreased safety awareness;Cardiopulmonary status limiting activity   OT Treatment/Interventions: Self-care/ADL training;Therapeutic exercise;Energy conservation;DME and/or AE instruction;Therapeutic activities;Cognitive remediation/compensation;Patient/family education;Balance training      OT Goals(Current goals can be found in the care plan section)   Acute Rehab OT Goals Patient Stated Goal: to return home OT Goal Formulation: With patient/family Time For Goal Achievement: 04/22/24 Potential to Achieve Goals: Good ADL Goals Pt Will Perform Grooming: with supervision;standing Pt Will Perform Lower Body Bathing: with contact guard assist;sitting/lateral leans;sit to/from stand Pt Will Perform Lower Body Dressing: with contact guard assist;sitting/lateral leans;sit to/from stand Pt Will Transfer to Toilet: with supervision;ambulating;regular height toilet (with least restrictive AD) Pt Will Perform Toileting - Clothing Manipulation and hygiene: with supervision;sit to/from stand   OT Frequency:  Min 2X/week    Co-evaluation PT/OT/SLP Co-Evaluation/Treatment: Yes Reason for Co-Treatment: Complexity of the patient's impairments (multi-system involvement);For patient/therapist safety PT goals addressed during session: Mobility/safety with mobility OT goals addressed during session: ADL's and self-care      AM-PAC OT 6 Clicks Daily Activity     Outcome Measure Help from another person eating meals?: A Little Help from another person taking care of personal grooming?: A Little Help from another person toileting, which includes using toliet, bedpan, or urinal?: A Lot Help from another person bathing (including washing, rinsing, drying)?: A Lot Help from another person to put on and taking off regular upper body clothing?: A Little Help from another person to put on and taking off regular lower body clothing?: A  Lot 6 Click Score: 15   End of  Session Equipment Utilized During Treatment: Gait belt;Rolling walker (2 wheels);Other (comment) (3in1/BSC) Nurse Communication: Mobility status;Other (comment) (Pt with positive orthostatics this session)  Activity Tolerance: Patient tolerated treatment well;Treatment limited secondary to medical complications (Comment) (Pt limited by positive orthostatics with symptoms this session) Patient left: in bed;with call bell/phone within reach;with bed alarm set;with family/visitor present  OT Visit Diagnosis: Unsteadiness on feet (R26.81);Other abnormalities of gait and mobility (R26.89);Muscle weakness (generalized) (M62.81);Other symptoms and signs involving cognitive function                Time: 8897-8858 OT Time Calculation (min): 39 min Charges:  OT General Charges $OT Visit: 1 Visit OT Evaluation $OT Eval Moderate Complexity: 1 Mod OT Treatments $Self Care/Home Management : 8-22 mins  Margarie Rockey HERO., OTR/L, MA Acute Rehab [phone]   Margarie FORBES Horns 04/08/2024, 5:15 PM

## 2024-04-08 NOTE — Evaluation (Addendum)
 Physical Therapy Evaluation Patient Details Name: Joseph [name] MRN: [id number] DOB: 18-Jul-1945 Today's Date: 04/08/2024  History of Present Illness  Joseph [name] is a 78/M admitted 9/25 after pt was getting routine lab drawn today, his wife was in the waiting room when she was called in, patient had a syncopal event with loss of consciousness, some facial droop and abnormal speech was noted as well which gradually resolved as he regained consciousness.  Subsequently brought to the ED as a code stroke.  PAF,CHF, hyperkalemia. PMH:  prior CVA, dementia, paroxysmal A-fib on Eliquis , diastolic CHF  Clinical Impression  Pt admitted with above diagnosis. Pt on EOB on arrival and wife getting ready to get pt to 3N1 with bed alarm ringing. PT turned off alarm and assisted pt to 3N1. Pt had BM and needing mod assist for transfers and min assist to clean self. OT joined PT when pt getting off 3N1 and after pt stood to clean self taking 3 min to clean self, reported dizziness.  PT obtained orthostatic VS and pt appears to be orthostatic as he reported dizziness.  Therefore assisted pt back to bed.  After speaking with wife, she cannot assist pt at home as she has rotator cuff injury.  Pt currently needing min to mod assist of 2 persons. Pt would benefit from post acute rehab < 3 hours day prior to d/c home. Will follow acutely. Sent orthostatic VS to MD.  Pt currently with functional limitations due to the deficits listed below (see PT Problem List). Pt will benefit from acute skilled PT to increase their independence and safety with mobility to allow discharge.           04/08/24 1100   Orthostatic Lying   BP- Lying   (pt on EOB on arrival)  Orthostatic Sitting  BP- Sitting (!) 160/96  Pulse- Sitting 81  Orthostatic Standing at 0 minutes  BP- Standing at 0 minutes (!) 123/101  Pulse- Standing at 0 minutes 98  Orthostatic Standing at 3 minutes  BP- Standing at 3 minutes (!) 173/106  Pulse-  Standing at 3 minutes 107     If plan is discharge home, recommend the following: A little help with walking and/or transfers;A little help with bathing/dressing/bathroom;Assistance with cooking/housework;Help with stairs or ramp for entrance;Supervision due to cognitive status;Assist for transportation   Can travel by private vehicle   No    Equipment Recommendations Rolling walker (2 wheels);BSC/3in1;Wheelchair (measurements PT);Wheelchair cushion (measurements PT)  Recommendations for Other Services       Functional Status Assessment Patient has had a recent decline in their functional status and demonstrates the ability to make significant improvements in function in a reasonable and predictable amount of time.     Precautions / Restrictions Precautions Precautions: Fall Restrictions Weight Bearing Restrictions Per Provider Order: No      Mobility  Bed Mobility Overal bed mobility: Needs Assistance Bed Mobility: Sit to Supine       Sit to supine: Mod assist   General bed mobility comments: Pt sitting on EOB on arrival with wife present and getting ready to get to 3N1 to have BM.  at end of session, pt needing assist to bring LEs into bed and to get settled in bed in chair position.    Transfers Overall transfer level: Needs assistance Equipment used: Rolling walker (2 wheels), 2 person hand held assist Transfers: Sit to/from Stand, Bed to chair/wheelchair/BSC Sit to Stand: Min assist, From elevated surface, +2 safety/equipment  Step pivot transfers: Min assist, +2 safety/equipment       General transfer comment: Pt needed min + 2 assist to step pivot to the 3N1 with use of RW with pt needing cues and assist for safety. If pt is on feet more than 20 seconds, his Les have shakiness and he needs min assist for stability. Pt was able to get to 3N1 with assist to have BM. Needed assist with Depends to pull down.  Pt wiped himself until he reported dizziness and fatigue,  then OT finished cleaning him. He needed assist to pull up Depends.  Orthostatics positive for therefore sent to MD and assisted pt to supine instead of walking today.    Ambulation/Gait               General Gait Details: Deferred due to orthostasis.  Stairs            Wheelchair Mobility     Tilt Bed    Modified Rankin (Stroke Patients Only)       Balance                                             Pertinent Vitals/Pain Pain Assessment Pain Assessment: No/denies pain    Home Living Family/patient expects to be discharged to:: Private residence Living Arrangements: Spouse/significant other Available Help at Discharge: Family;Available 24 hours/day Type of Home: House Home Access: Stairs to enter   [employer] of Steps: 1 + 1   Home Layout: Laundry or work area in basement;Able to live on main level with bedroom/bathroom Home Equipment: None      Prior Function Prior Level of Function : Independent/Modified Independent             Mobility Comments: was able to ambulate without device ADLs Comments: Ind to Mod for ADLs. Pt's wife engages him in IADLs as much as possible (ex. pt with carry the garbage out). Wife manages all other IADLs, including transportation. Pt reports he likes learning about new things, building/assembing things, and watching TV.     Extremity/Trunk Assessment   Upper Extremity Assessment Upper Extremity Assessment: Right hand dominant    Lower Extremity Assessment Lower Extremity Assessment: RLE deficits/detail;LLE deficits/detail RLE Deficits / Details: hip 3/5, knee 4/5, ankle 3/5 LLE Deficits / Details: hip 3/5, knee 4/5, ankle 3/5    Cervical / Trunk Assessment Cervical / Trunk Assessment: Kyphotic  Communication   Communication Communication: No apparent difficulties    Cognition Arousal: Alert Behavior During Therapy: Flat affect   PT - Cognitive impairments: No apparent  impairments                         Following commands: Intact       Cueing       General Comments General comments (skin integrity, edema, etc.): See orthostatic VS    Exercises     Assessment/Plan    PT Assessment Patient needs continued PT services  PT Problem List Decreased activity tolerance;Decreased balance;Decreased mobility;Decreased knowledge of use of DME;Decreased safety awareness;Decreased knowledge of precautions;Cardiopulmonary status limiting activity       PT Treatment Interventions DME instruction;Gait training;Functional mobility training;Therapeutic activities;Therapeutic exercise;Balance training;Patient/family education    PT Goals (Current goals can be found in the Care Plan section)  Acute Rehab PT Goals Patient Stated Goal: to go home PT  Goal Formulation: With patient Time For Goal Achievement: 04/22/24 Potential to Achieve Goals: Good    Frequency Min 2X/week     Co-evaluation PT/OT/SLP Co-Evaluation/Treatment: Yes Reason for Co-Treatment: Complexity of the patient's impairments (multi-system involvement);For patient/therapist safety PT goals addressed during session: Mobility/safety with mobility         AM-PAC PT 6 Clicks Mobility  Outcome Measure Help needed turning from your back to your side while in a flat bed without using bedrails?: A Lot Help needed moving from lying on your back to sitting on the side of a flat bed without using bedrails?: A Lot Help needed moving to and from a bed to a chair (including a wheelchair)?: Total Help needed standing up from a chair using your arms (e.g., wheelchair or bedside chair)?: Total Help needed to walk in hospital room?: Total Help needed climbing 3-5 steps with a railing? : Total 6 Click Score: 8    End of Session Equipment Utilized During Treatment: Gait belt Activity Tolerance: Patient limited by fatigue Patient left: in bed;with call bell/phone within reach;with bed alarm  set;with family/visitor present Nurse Communication: Mobility status PT Visit Diagnosis: Unsteadiness on feet (R26.81);Muscle weakness (generalized) (M62.81)    Time: 8942-8858 PT Time Calculation (min) (ACUTE ONLY): 44 min   Charges:   PT Evaluation $PT Eval Moderate Complexity: 1 Mod PT Treatments $Therapeutic Activity: 8-22 mins PT General Charges $$ ACUTE PT VISIT: 1 Visit         Sadler Teschner M,PT Acute Rehab Services [phone]   Stephane JULIANNA Bevel 04/08/2024, 2:23 PM

## 2024-04-08 NOTE — Progress Notes (Signed)
 Noted MRI and EEG findings without acute concern. No changes needed from neuro standpoint, we will be available as needed.   Aisha Seals, MD [facility]   If 7pm- 7am, please page neurology on call as listed in [facility].

## 2024-04-09 DIAGNOSIS — R55 Syncope and collapse: Secondary | ICD-10-CM | POA: Diagnosis not present

## 2024-04-09 LAB — BASIC METABOLIC PANEL WITH GFR
Anion gap: 11 (ref 5–15)
BUN: 25 mg/dL — ABNORMAL HIGH (ref 8–23)
CO2: 22 mmol/L (ref 22–32)
Calcium: 8.7 mg/dL — ABNORMAL LOW (ref 8.9–10.3)
Chloride: 106 mmol/L (ref 98–111)
Creatinine, Ser: 1.54 mg/dL — ABNORMAL HIGH (ref 0.61–1.24)
GFR, Estimated: 46 mL/min — ABNORMAL LOW (ref >60)
Glucose, Bld: 101 mg/dL — ABNORMAL HIGH (ref 70–99)
Potassium: 4.3 mmol/L (ref 3.5–5.1)
Sodium: 139 mmol/L (ref 135–145)

## 2024-04-09 NOTE — Plan of Care (Signed)
  Problem: Education: Goal: Knowledge of General Education information will improve Description: Including pain rating scale, medication(s)/side effects and non-pharmacologic comfort measures Outcome: Progressing   Problem: Health Behavior/Discharge Planning: Goal: Ability to manage health-related needs will improve Outcome: Progressing   Problem: Clinical Measurements: Goal: Respiratory complications will improve Outcome: Progressing Goal: Cardiovascular complication will be avoided Outcome: Progressing   Problem: Activity: Goal: Risk for activity intolerance will decrease Outcome: Progressing   Problem: Nutrition: Goal: Adequate nutrition will be maintained Outcome: Progressing   Problem: Coping: Goal: Level of anxiety will decrease Outcome: Progressing   Problem: Pain Managment: Goal: General experience of comfort will improve and/or be controlled Outcome: Progressing

## 2024-04-09 NOTE — Progress Notes (Signed)
 PROGRESS NOTE    Joseph [name]  FMW:[id number] DOB: 1946/03/27 DOA: 04/06/2024 PCP: Joseph Wilhelmena Lloyd Hilario, FNP   Joseph [name] is a 78/M with history of prior CVA, dementia, paroxysmal A-fib on Eliquis , diastolic CHF was getting routine lab drawn today, his wife was in the waiting room when she was called in, patient had a syncopal event with loss of consciousness, there after some facial droop and abnormal speech was noted as well which gradually resolved as he regained consciousness.  Subsequently brought to the ED as a code stroke, ED Course: Hypertensive, labs notable for potassium of 5.7, creatinine 1.9  Subjective: - Confusion yesterday evening, wife concerned about balance,  Assessment and Plan:   Syncope and collapse -Transient facial droop -Appears to be a syncopal episode, possibly vasovagal  - Borderline orthostatics positive, clonidine  discontinued -Neurology consult appreciated, MRI brain and EEG unremarkable for acute findings - PT OT eval completed, SNF recommended, wife wants rehab, TOC consult - Discharge planning  Paroxysmal atrial fibrillation  - Poor compliance with Eliquis  at baseline, only takes a.m. 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 - Creatinine slightly higher than baseline on admission, now improving, resume Entresto   Hyperkalemia - Now improved, restart Aldactone    Dementia - Resume Aricept       DVT prophylaxis: Lovenox Code Status: Full code Family Communication: Discussed with wife at bedside Disposition Plan: SNF for short-term rehab  Consultants:    Procedures:   Antimicrobials:    Objective: Vitals:   04/08/24 2220 04/09/24 0556 04/09/24 0814 04/09/24 0836  BP: (!) 153/85 (!) 158/81 (!) 147/85   Pulse: (!) 59 (!) 59 (!) 59 62  Resp: 17 17    Temp: 98.1 F (36.7 C) 98 F  (36.7 C) 97.9 F (36.6 C)   TempSrc: Oral  Oral   SpO2: 99% 98% 99%   Weight:        Intake/Output Summary (Last 24 hours) at 04/09/2024 1036 Last data filed at 04/09/2024 0500 Gross per 24 hour  Intake --  Output 200 ml  Net -200 ml   Filed Weights   04/06/24 1100  Weight: 103.8 kg    Examination:  General exam: Appears calm and comfortable, cognitive deficits, no distress Respiratory system: Clear to auscultation Cardiovascular system: S1 & S2 heard, RRR.  Abd: nondistended, soft and nontender.Normal bowel sounds heard. Central nervous system: Alert and oriented X2. No focal neurological deficits. Extremities: no edema Skin: No rashes Psychiatry: Flat affect    Data Reviewed:   CBC: Recent Labs  Lab 04/06/24 1159 04/06/24 1207 04/07/24 0530  WBC 3.8*  --  5.6  NEUTROABS 2.1  --   --   HGB 12.8* 14.3 12.1*  HCT 41.6 42.0 38.8*  MCV 70.9*  --  70.4*  PLT 113*  --  179   Basic Metabolic Panel: Recent Labs  Lab 04/06/24 1207 04/06/24 1456 04/07/24 0530 04/08/24 0147 04/09/24 0436  NA 139 137 136 136 139  K 5.7* 4.7 4.0 3.8 4.3  CL 105 101 103 104 106  CO2  --  25 20* 23 22  GLUCOSE 113* 102* 99 95 101*  BUN 33* 22 28* 21 25*  CREATININE 1.90* 1.58* 1.60* 1.31* 1.54*  CALCIUM   --  9.0 8.6* 8.6* 8.7*   GFR: Estimated Creatinine Clearance: 48.4 mL/min (A) (by C-G formula based on  SCr of 1.54 mg/dL (H)). Liver Function Tests: Recent Labs  Lab 04/06/24 1456 04/07/24 0530  AST 23 17  ALT 15 12  ALKPHOS 61 52  BILITOT 1.5* 1.4*  PROT 6.7 6.0*  ALBUMIN 3.6 3.1*   No results for input(s): LIPASE, AMYLASE in the last 168 hours. No results for input(s): AMMONIA in the last 168 hours. Coagulation Profile: Recent Labs  Lab 04/06/24 1159  INR 1.1   Cardiac Enzymes: No results for input(s): CKTOTAL, CKMB, CKMBINDEX, TROPONINI in the last 168 hours. BNP (last 3 results) No results for input(s): PROBNP in the last 8760  hours. HbA1C: No results for input(s): HGBA1C in the last 72 hours. CBG: Recent Labs  Lab 04/06/24 1118  GLUCAP 118*   Lipid Profile: Recent Labs    04/07/24 0530  CHOL 139  HDL 41  LDLCALC 81  TRIG 86  CHOLHDL 3.4   Thyroid  Function Tests: No results for input(s): TSH, T4TOTAL, FREET4, T3FREE, THYROIDAB in the last 72 hours. Anemia Panel: No results for input(s): VITAMINB12, FOLATE, FERRITIN, TIBC, IRON, RETICCTPCT in the last 72 hours. Urine analysis:    Component Value Date/Time   COLORURINE YELLOW 04/22/2023 1732   APPEARANCEUR Clear 04/03/2024 1053   LABSPEC 1.017 04/22/2023 1732   PHURINE 5.0 04/22/2023 1732   GLUCOSEU Negative 04/03/2024 1053   HGBUR NEGATIVE 04/22/2023 1732   BILIRUBINUR Negative 04/03/2024 1053   KETONESUR NEGATIVE 04/22/2023 1732   PROTEINUR Negative 04/03/2024 1053   PROTEINUR NEGATIVE 04/22/2023 1732   NITRITE Negative 04/03/2024 1053   NITRITE NEGATIVE 04/22/2023 1732   LEUKOCYTESUR Negative 04/03/2024 1053   LEUKOCYTESUR NEGATIVE 04/22/2023 1732   Sepsis Labs: @LABRCNTIP (procalcitonin:4,lacticidven:4)  )No results found for this or any previous visit (from the past 240 hours).   Radiology Studies: VAS US  CAROTID (at [facility] and [facility] only) Result Date: 04/07/2024 Carotid Arterial Duplex Study Patient Name:  Joseph [name] [facility]  Date of Exam:   04/07/2024 Medical Rec #: [id number]          Accession #:    [id number] Date of Birth: [birth year]           Patient Gender: M Patient Age:   60 years Exam Location:  [facility] Procedure:      VAS US  CAROTID Referring Phys: [name] --------------------------------------------------------------------------------  Indications: CVA. Performing Technologist: Ezzie Potters RVT, RDMS  Examination Guidelines: A complete evaluation includes B-mode imaging, spectral Doppler, color Doppler, and power Doppler as needed of all accessible portions of each vessel. Bilateral testing is  considered an integral part of a complete examination. Limited examinations for reoccurring indications may be performed as noted.  Right Carotid Findings: +----------+--------+--------+--------+------------------+------------------+           PSV cm/sEDV cm/sStenosisPlaque DescriptionComments           +----------+--------+--------+--------+------------------+------------------+ CCA Prox  74      6                                 intimal thickening +----------+--------+--------+--------+------------------+------------------+ CCA Distal51      12                                intimal thickening +----------+--------+--------+--------+------------------+------------------+ ICA Prox  33      9  intimal thickening +----------+--------+--------+--------+------------------+------------------+ ICA Distal53      17                                                   +----------+--------+--------+--------+------------------+------------------+ ECA       69      6                                                    +----------+--------+--------+--------+------------------+------------------+ +----------+--------+-------+----------------+-------------------+           PSV cm/sEDV cmsDescribe        Arm Pressure (mmHG) +----------+--------+-------+----------------+-------------------+ [id number]            Multiphasic, WNL                    +----------+--------+-------+----------------+-------------------+ +---------+--------+--+--------+-+---------+ VertebralPSV cm/s25EDV cm/s7Antegrade +---------+--------+--+--------+-+---------+  Left Carotid Findings: +----------+--------+--------+--------+------------------+------------------+           PSV cm/sEDV cm/sStenosisPlaque DescriptionComments           +----------+--------+--------+--------+------------------+------------------+ CCA Prox  56      9               hypoechoic         intimal thickening +----------+--------+--------+--------+------------------+------------------+ CCA Distal64      11                                intimal thickening +----------+--------+--------+--------+------------------+------------------+ ICA Prox  29      9                                                    +----------+--------+--------+--------+------------------+------------------+ ICA Distal61      21                                                   +----------+--------+--------+--------+------------------+------------------+ ECA       79      10                                                   +----------+--------+--------+--------+------------------+------------------+ +----------+--------+--------+----------------+-------------------+           PSV cm/sEDV cm/sDescribe        Arm Pressure (mmHG) +----------+--------+--------+----------------+-------------------+ [id number]             Multiphasic, WNL                    +----------+--------+--------+----------------+-------------------+ +---------+--------+--+--------+-+---------+ VertebralPSV cm/s26EDV cm/s7Antegrade +---------+--------+--+--------+-+---------+   Summary: Right Carotid: The extracranial vessels were near-normal with only minimal wall                thickening or plaque. Left Carotid: The extracranial vessels were near-normal with only minimal wall  thickening or plaque. Vertebrals:  Bilateral vertebral arteries demonstrate antegrade flow. Subclavians: Normal flow hemodynamics were seen in bilateral subclavian              arteries. *See table(s) above for measurements and observations.  Electronically signed by Eather Popp MD on 04/07/2024 at 5:00:08 PM.    Final    MR BRAIN WO CONTRAST Result Date: 04/07/2024 CLINICAL DATA:  Provided history: Neuro deficit, acute, stroke suspected. EXAM: MRI HEAD WITHOUT CONTRAST TECHNIQUE: Multiplanar, multiecho pulse  sequences of the brain and surrounding structures were obtained without intravenous contrast. COMPARISON:  Non-contrast head CT and CT angiogram head/neck 04/06/2024. Brain MRI 06/15/2023. FINDINGS: Intermittently motion degraded examination. Most notably, the sagittal T1 sequence is severely motion degraded and the coronal T2 sequence is moderately motion degraded. Within this limitation, findings are as follows. Brain: Generalized cerebral atrophy. Numerous chronic lacunar infarcts within the bilateral cerebral hemispheric white matter, within/about the bilateral deep gray nuclei, within the right aspect of the pons, within the right middle cerebellar peduncle and left cerebellar hemisphere. Several lacunar infarcts are new as compared to the MRI of 06/15/2023. For instance, bilateral thalamic lacunar infarcts have progressed in number. Multifocal T2 FLAIR hyperintense signal abnormality elsewhere within the cerebral white matter and pons, nonspecific but compatible with chronic small vessel ischemic disease. Numerous chronic microhemorrhages scattered within the supratentorial and infratentorial brain, similar to the prior MRI There is no acute infarct. No evidence of an intracranial mass. No extra-axial fluid collection. No midline shift. Vascular: Maintained flow voids within the proximal large arterial vessels. Skull and upper cervical spine: No focal worrisome marrow lesion. Sinuses/Orbits: No mass or acute finding within the imaged orbits. No significant paranasal sinus disease. IMPRESSION: 1. Intermittently motion degraded examination. 2. No evidence of an acute intracranial abnormality. The diffusion-weighted imaging is of good quality and there is no evidence of an acute infarct. 3. Parenchymal atrophy and advanced chronic small vessel ischemic disease (with numerous chronic lacunar infarcts and chronic microhemorrhages) as described. Electronically Signed   By: Rockey Childs D.O.   On: 04/07/2024 13:49    ECHOCARDIOGRAM COMPLETE Result Date: 04/07/2024    ECHOCARDIOGRAM REPORT   Patient Name:   MASIN SHATTO Date of Exam: 04/07/2024 Medical Rec #:  [id number]         Height:       69.0 in Accession #:    [id number]        Weight:       228.8 lb Date of Birth:  03-Jan-1946          BSA:          2.188 m Patient Age:    78 years          BP:           158/82 mmHg Patient Gender: M                 HR:           68 bpm. Exam Location:  Inpatient Procedure: 2D Echo, Cardiac Doppler, Color Doppler and Intracardiac            Opacification Agent (Both Spectral and Color Flow Doppler were            utilized during procedure). Indications:    Stroke  History:        Patient has prior history of Echocardiogram examinations, most  recent 02/04/2024. Cardiomyopathy and CHF, Abnormal ECG and                 Pacemaker, Stroke, Arrythmias:RBBB, Bradycardia, Atrial                 Fibrillation and Atrial Flutter, Signs/Symptoms:Syncope; Risk                 Factors:Hypertension.  Sonographer:    Ellouise Mose RDCS Referring Phys: Joseph FRAMES  Sonographer Comments: Technically difficult study due to poor echo windows, Technically challenging study due to limited acoustic windows, suboptimal apical window and no subcostal window. Image acquisition challenging due to patient body habitus. Suboptimal study despite attempt to turn patient. IMPRESSIONS  1. Left ventricular ejection fraction, by estimation, is 40 to 45%. The left ventricle has mildly decreased function. The left ventricle demonstrates regional wall motion abnormalities (see scoring diagram/findings for description). There is mild left ventricular hypertrophy. Left ventricular diastolic parameters are consistent with Grade I diastolic dysfunction (impaired relaxation).  2. Right ventricular systolic function is normal. The right ventricular size is normal. Tricuspid regurgitation signal is inadequate for assessing PA pressure.  3. The mitral valve is  normal in structure. No evidence of mitral valve regurgitation. No evidence of mitral stenosis.  4. The aortic valve is normal in structure. Aortic valve regurgitation is not visualized. No aortic stenosis is present.  5. The inferior vena cava is normal in size with greater than 50% respiratory variability, suggesting right atrial pressure of 3 mmHg. Conclusion(s)/Recommendation(s): No intracardiac source of embolism detected on this transthoracic study. Consider a transesophageal echocardiogram to exclude cardiac source of embolism if clinically indicated. FINDINGS  Left Ventricle: Left ventricular ejection fraction, by estimation, is 40 to 45%. The left ventricle has mildly decreased function. The left ventricle demonstrates regional wall motion abnormalities. The left ventricular internal cavity size was normal in size. There is mild left ventricular hypertrophy. Left ventricular diastolic parameters are consistent with Grade I diastolic dysfunction (impaired relaxation).  LV Wall Scoring: The posterior wall is akinetic. Right Ventricle: The right ventricular size is normal. No increase in right ventricular wall thickness. Right ventricular systolic function is normal. Tricuspid regurgitation signal is inadequate for assessing PA pressure. Left Atrium: Left atrial size was normal in size. Right Atrium: Right atrial size was normal in size. Pericardium: There is no evidence of pericardial effusion. Mitral Valve: The mitral valve is normal in structure. No evidence of mitral valve regurgitation. No evidence of mitral valve stenosis. Tricuspid Valve: The tricuspid valve is normal in structure. Tricuspid valve regurgitation is not demonstrated. No evidence of tricuspid stenosis. Aortic Valve: The aortic valve is normal in structure. Aortic valve regurgitation is not visualized. No aortic stenosis is present. Pulmonic Valve: The pulmonic valve was normal in structure. Pulmonic valve regurgitation is not visualized.  No evidence of pulmonic stenosis. Aorta: The aortic root is normal in size and structure. Venous: The inferior vena cava is normal in size with greater than 50% respiratory variability, suggesting right atrial pressure of 3 mmHg. IAS/Shunts: No atrial level shunt detected by color flow Doppler.  LEFT VENTRICLE PLAX 2D LVIDd:         4.90 cm      Diastology LVIDs:         3.40 cm      LV e' medial:    5.66 cm/s LV PW:         1.30 cm      LV E/e' medial:  9.0 LV  IVS:        1.30 cm      LV e' lateral:   7.07 cm/s LVOT diam:     2.10 cm      LV E/e' lateral: 7.2 LV SV:         53 LV SV Index:   24 LVOT Area:     3.46 cm  LV Volumes (MOD) LV vol d, MOD A2C: 68.2 ml LV vol d, MOD A4C: 153.0 ml LV vol s, MOD A2C: 41.3 ml LV vol s, MOD A4C: 78.3 ml LV SV MOD A2C:     26.9 ml LV SV MOD A4C:     153.0 ml LV SV MOD BP:      45.6 ml RIGHT VENTRICLE            IVC RV S prime:     7.07 cm/s  IVC diam: 1.80 cm TAPSE (M-mode): 1.3 cm LEFT ATRIUM             Index        RIGHT ATRIUM           Index LA diam:        3.50 cm 1.60 cm/m   RA Area:     12.20 cm LA Vol (A2C):   25.9 ml 11.84 ml/m  RA Volume:   27.60 ml  12.62 ml/m LA Vol (A4C):   40.0 ml 18.28 ml/m LA Biplane Vol: 33.2 ml 15.18 ml/m  AORTIC VALVE LVOT Vmax:   79.70 cm/s LVOT Vmean:  52.400 cm/s LVOT VTI:    0.152 m  AORTA Ao Root diam: 3.30 cm Ao Asc diam:  3.20 cm MITRAL VALVE MV Area (PHT): 3.27 cm    SHUNTS MV Decel Time: 232 msec    Systemic VTI:  0.15 m MV E velocity: 50.80 cm/s  Systemic Diam: 2.10 cm MV A velocity: 74.40 cm/s MV E/A ratio:  0.68 Oneil Parchment MD Electronically signed by Oneil Parchment MD Signature Date/Time: 04/07/2024/1:07:17 PM    Final    EEG adult Result Date: 04/07/2024 Shelton Arlin KIDD, MD     04/07/2024  5:00 PM Patient Name: MAYFORD ALBERG MRN: [id number] Epilepsy Attending: Arlin KIDD Shelton Referring Physician/Provider: Michaela Aisha SQUIBB, MD Date: 04/07/2024 Duration: 22.10 mins Patient history: 78 y.o. male with a history of  dementia who presents with sudden mental status change in the setting of venipuncture. EEG to evaluate for seizure Level of alertness: Awake AEDs during EEG study: None Technical aspects: This EEG study was done with scalp electrodes positioned according to the 10-20 International system of electrode placement. Electrical activity was reviewed with band pass filter of 1-70Hz , sensitivity of 7 uV/mm, display speed of 75mm/sec with a 60Hz  notched filter applied as appropriate. EEG data were recorded continuously and digitally stored.  Video monitoring was available and reviewed as appropriate. Description: The posterior dominant rhythm consists of 8 Hz activity of moderate voltage (25-35 uV) seen predominantly in posterior head regions, symmetric and reactive to eye opening and eye closing. Hyperventilation and photic stimulation were not performed.   IMPRESSION: This study is within normal limits. No seizures or epileptiform discharges were seen throughout the recording. A normal interictal EEG does not exclude the diagnosis of epilepsy. Priyanka O Yadav     Scheduled Meds:  alfuzosin   10 mg Oral q morning   apixaban   5 mg Oral BID   carvedilol   3.125 mg Oral BID WC   cycloSPORINE   1 drop Both Eyes BID  donepezil   10 mg Oral Daily   latanoprost   1 drop Both Eyes QHS   mirabegron  ER  50 mg Oral q morning   rosuvastatin   20 mg Oral Daily   sacubitril -valsartan   1 tablet Oral BID   sodium chloride  flush  3 mL Intravenous Once   spironolactone   25 mg Oral Daily   Continuous Infusions:     LOS: 0 days    Time spent:    Sigurd Pac, MD [facility]   04/09/2024, 10:36 AM

## 2024-04-09 NOTE — Plan of Care (Signed)
  Problem: Education: Goal: Knowledge of General Education information will improve Description: Including pain rating scale, medication(s)/side effects and non-pharmacologic comfort measures Outcome: Progressing   Problem: Health Behavior/Discharge Planning: Goal: Ability to manage health-related needs will improve Outcome: Progressing   Problem: Clinical Measurements: Goal: Ability to maintain clinical measurements within normal limits will improve Outcome: Progressing Goal: Will remain free from infection Outcome: Progressing Goal: Diagnostic test results will improve Outcome: Progressing Goal: Respiratory complications will improve Outcome: Progressing Goal: Cardiovascular complication will be avoided Outcome: Progressing   Problem: Safety: Goal: Ability to remain free from injury will improve Outcome: Progressing   Problem: Skin Integrity: Goal: Risk for impaired skin integrity will decrease Outcome: Progressing   Problem: Nutrition: Goal: Risk of aspiration will decrease Outcome: Progressing Goal: Dietary intake will improve Outcome: Progressing   Problem: Self-Care: Goal: Ability to participate in self-care as condition permits will improve Outcome: Progressing Goal: Verbalization of feelings and concerns over difficulty with self-care will improve Outcome: Progressing Goal: Ability to communicate needs accurately will improve Outcome: Progressing

## 2024-04-10 DIAGNOSIS — R55 Syncope and collapse: Secondary | ICD-10-CM | POA: Diagnosis not present

## 2024-04-10 LAB — BASIC METABOLIC PANEL WITH GFR
Anion gap: 7 (ref 5–15)
BUN: 24 mg/dL — ABNORMAL HIGH (ref 8–23)
CO2: 22 mmol/L (ref 22–32)
Calcium: 8.6 mg/dL — ABNORMAL LOW (ref 8.9–10.3)
Chloride: 106 mmol/L (ref 98–111)
Creatinine, Ser: 1.59 mg/dL — ABNORMAL HIGH (ref 0.61–1.24)
GFR, Estimated: 45 mL/min — ABNORMAL LOW (ref >60)
Glucose, Bld: 110 mg/dL — ABNORMAL HIGH (ref 70–99)
Potassium: 4 mmol/L (ref 3.5–5.1)
Sodium: 135 mmol/L (ref 135–145)

## 2024-04-10 NOTE — Progress Notes (Signed)
 Physical Therapy Treatment Patient Details Name: Joseph [name] MRN: [id number] DOB: 01-Nov-1945 Today's Date: 04/10/2024   History of Present Illness Joseph [name] is a 78/M admitted 9/25 after pt was getting routine lab drawn today, his wife was in the waiting room when she was called in, patient had a syncopal event with loss of consciousness, some facial droop and abnormal speech was noted as well which gradually resolved as he regained consciousness.  Subsequently brought to the ED as a code stroke.  PAF,CHF, hyperkalemia. PMH:  prior CVA, dementia, paroxysmal A-fib on Eliquis , diastolic CHF    PT Comments  Pt has increased BP today (see orthostatic vitals in Flowsheets) but complains of increased dizziness with standing, coupled with increased LE weakness in standing L LE >R LE. Pt is mod A for bed mobility, and transfers and min A for taking lateral steps along the EoB. Given dizziness and weakness did not attempt walking in room today. D/c plans remain appropriate at this time. PT will continue to follow acutely.    If plan is discharge home, recommend the following: A little help with walking and/or transfers;A little help with bathing/dressing/bathroom;Assistance with cooking/housework;Help with stairs or ramp for entrance;Supervision due to cognitive status;Assist for transportation   Can travel by private vehicle     No  Equipment Recommendations  Rolling walker (2 wheels);BSC/3in1;Wheelchair (measurements PT);Wheelchair cushion (measurements PT)       Precautions / Restrictions Precautions Precautions: Fall Precaution/Restrictions Comments: watch BP Restrictions Weight Bearing Restrictions Per Provider Order: No     Mobility  Bed Mobility Overal bed mobility: Needs Assistance Bed Mobility: Sit to Supine, Supine to Sit     Supine to sit: Mod assist Sit to supine: Mod assist   General bed mobility comments: modA and maximal cuing for sequencing coming to the EoB,  and modA for returning LE to bed    Transfers Overall transfer level: Needs assistance Equipment used: Rolling walker (2 wheels), 2 person hand held assist Transfers: Sit to/from Stand, Bed to chair/wheelchair/BSC Sit to Stand: From elevated surface, +2 safety/equipment, Mod assist          Lateral/Scoot Transfers: Min assist General transfer comment: pt able to initiate power up but requires modA for coming to fully upright, pt with complaints of increased dizziness in standing, min A for steadying with lateral steps along EoB    Ambulation/Gait               General Gait Details: deferred due to increased L LE tremoring with standing at EoB and complaints of increased dizziness       Balance Overall balance assessment: Needs assistance Sitting-balance support: Single extremity supported, Bilateral upper extremity supported, No upper extremity supported, Feet supported Sitting balance-Leahy Scale: Fair     Standing balance support: Single extremity supported, Bilateral upper extremity supported, During functional activity Standing balance-Leahy Scale: Poor Standing balance comment: requires outside assist for dynamic balance                            Communication Communication Communication: Impaired Factors Affecting Communication: Difficulty expressing self;Reduced clarity of speech (requires increased time and stuttering seems to increased with increased time in standing)  Cognition Arousal: Alert Behavior During Therapy: Flat affect   PT - Cognitive impairments: Orientation   Orientation impairments: Time, Place                   PT - Cognition Comments:  pt thinks he is in [place], not oriented to day of week Following commands: Impaired Following commands impaired: Only follows one step commands consistently    Cueing Cueing Techniques: Verbal cues, Gestural cues, Tactile cues     General Comments General comments (skin  integrity, edema, etc.): elevated BP with standing see orthostatic vitals      Pertinent Vitals/Pain Pain Assessment Pain Assessment: No/denies pain     PT Goals (current goals can now be found in the care plan section) Acute Rehab PT Goals Patient Stated Goal: to go home PT Goal Formulation: With patient Time For Goal Achievement: 04/22/24 Potential to Achieve Goals: Fair Progress towards PT goals: Not progressing toward goals - comment    Frequency    Min 2X/week       AM-PAC PT 6 Clicks Mobility   Outcome Measure  Help needed turning from your back to your side while in a flat bed without using bedrails?: A Lot Help needed moving from lying on your back to sitting on the side of a flat bed without using bedrails?: A Lot Help needed moving to and from a bed to a chair (including a wheelchair)?: Total Help needed standing up from a chair using your arms (e.g., wheelchair or bedside chair)?: Total Help needed to walk in hospital room?: Total Help needed climbing 3-5 steps with a railing? : Total 6 Click Score: 8    End of Session Equipment Utilized During Treatment: Gait belt Activity Tolerance: Patient limited by fatigue Patient left: in bed;with call bell/phone within reach;with bed alarm set;with family/visitor present Nurse Communication: Mobility status PT Visit Diagnosis: Unsteadiness on feet (R26.81);Muscle weakness (generalized) (M62.81)     Time: 8347-8285 PT Time Calculation (min) (ACUTE ONLY): 22 min  Charges:    $Therapeutic Activity: 8-22 mins PT General Charges $$ ACUTE PT VISIT: 1 Visit                     Joseph [name] B. [name] Lapidus PT, DPT Acute Rehabilitation Services Please use secure chat or  Call Office [phone]    Joseph [name] [facility] 04/10/2024, 5:39 PM

## 2024-04-10 NOTE — Plan of Care (Signed)

## 2024-04-10 NOTE — Plan of Care (Signed)

## 2024-04-11 ENCOUNTER — Telehealth: Payer: Self-pay | Admitting: Diagnostic Neuroimaging

## 2024-04-11 DIAGNOSIS — R55 Syncope and collapse: Secondary | ICD-10-CM | POA: Diagnosis not present

## 2024-04-11 LAB — BASIC METABOLIC PANEL WITH GFR
Anion gap: 7 (ref 5–15)
BUN: 18 mg/dL (ref 8–23)
CO2: 23 mmol/L (ref 22–32)
Calcium: 8.7 mg/dL — ABNORMAL LOW (ref 8.9–10.3)
Chloride: 105 mmol/L (ref 98–111)
Creatinine, Ser: 1.19 mg/dL (ref 0.61–1.24)
GFR, Estimated: >60 mL/min (ref >60)
Glucose, Bld: 98 mg/dL (ref 70–99)
Potassium: 4 mmol/L (ref 3.5–5.1)
Sodium: 135 mmol/L (ref 135–145)

## 2024-04-11 NOTE — Telephone Encounter (Signed)
 Patient's wife, Oluwatimileyin Vivier, said, went to the lab for blood work, after giving blood he passed out. EMS transported to [facility], and they can not find anything. Would like a call from the nurse to discuss a sooner appointment than January. He will be transferred to rehabilitation today, hopefully. Will be rehabilitation for 2 weeks.

## 2024-04-11 NOTE — Progress Notes (Signed)
 Patient seen and examined, no changes from my DC summary yesterday -TOC following for SNF, remains medically stable for discharge  Sigurd Pac, MD

## 2024-04-11 NOTE — Telephone Encounter (Signed)
 Spoke w/Pt wife regarding Pt recent hospitalization. Wife stated since being in the hospital Pt has gotten combative and uncooperative. Wife stated Pt supposed to be transported to rehab today, they are awaiting insurance approval. Wife asked if Dr. Denice could prescribe something for Pt for combativeness. Explained that Pt is going to rehab and if the behavior continues the doctor there can prescribe medication to help with that as Dr. Margaret does not prescribe meds when Pt is in rehab. Asked if Pt will be getting therapy while there. Wife stated yes, PT. Discussed PT will be good as well as ST, which can also help Pt cognitively. Wife voiced understanding. Asked if Pt can get earlier appt than Jan. Discussed waiting to see how things go with Pt while in rehab and if therapy helps. Once he is being discharged from rehab we can check for an earlier appt if available. Wife voiced understanding and thanks for the call back and info.

## 2024-04-11 NOTE — Plan of Care (Signed)
  Problem: Education: Goal: Knowledge of General Education information will improve Description: Including pain rating scale, medication(s)/side effects and non-pharmacologic comfort measures Outcome: Progressing   Problem: Health Behavior/Discharge Planning: Goal: Ability to manage health-related needs will improve Outcome: Progressing   Problem: Clinical Measurements: Goal: Will remain free from infection Outcome: Progressing Goal: Diagnostic test results will improve Outcome: Progressing Goal: Respiratory complications will improve Outcome: Progressing   Problem: Nutrition: Goal: Adequate nutrition will be maintained Outcome: Progressing   Problem: Coping: Goal: Level of anxiety will decrease Outcome: Progressing   Problem: Activity: Goal: Risk for activity intolerance will decrease Outcome: Not Progressing

## 2024-04-11 NOTE — Plan of Care (Signed)

## 2024-04-11 NOTE — NC FL2 (Signed)
 [facility]  MEDICAID FL2 LEVEL OF CARE FORM     IDENTIFICATION  Patient Name: Joseph [name] Birthdate: [birth year] Sex: male Admission Date (Current Location): 04/06/2024  [facility] and [place] Number:  [employer] and Address:  [facility], [address] [place], [place] [address]      Provider Number: [id number]  Attending Physician Name and Address:  Fairy Frames, MD  Relative Name and Phone Number:       Current Level of Care: Hospital Recommended Level of Care: Skilled Nursing Facility Prior Approval Number:    Date Approved/Denied:   PASRR Number:    Discharge Plan: SNF    Current Diagnoses: Patient Active Problem List   Diagnosis Date Noted   Syncope and collapse 04/06/2024   Chronic dryness of both eyes 10/28/2023   Chronic hoarseness 06/29/2023   Prediabetes 06/29/2023   AKI (acute kidney injury) 02/03/2022   NICM (nonischemic cardiomyopathy) (HCC) 02/03/2022   Sinus node dysfunction (HCC)    Atrial flutter (HCC) 01/23/2022   OAB (overactive bladder) 12/27/2020   Nocturia 01/10/2020   Benign prostatic hyperplasia with urinary obstruction 01/10/2020   Non-recurrent unilateral inguinal hernia without obstruction or gangrene    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   History of adenomatous polyp of colon 02/27/2019   Paroxysmal atrial fibrillation (HCC) 07/18/2015   Bradycardia 06/29/2015   Near syncope    Atrial fibrillation with rapid ventricular response (HCC) 06/27/2015   Left ventricular diastolic dysfunction, NYHA class 3 10/26/2014   Kidney cysts 04/04/2013   HTN (hypertension) 03/31/2013    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 03/31/2013   Diastolic dysfunction- grade 2- echo 06/28/15 03/31/2013   RBBB 03/31/2013   AV block, 1st degree 03/31/2013   Sinus bradycardia 03/31/2013   Hematochezia 12/29/2011    Orientation RESPIRATION BLADDER Height & Weight     Self  Normal Incontinent Weight: 228 lb 13.4 oz (103.8  kg) Height:     BEHAVIORAL SYMPTOMS/MOOD NEUROLOGICAL BOWEL NUTRITION STATUS      Continent Diet (please refer to dc summary)  AMBULATORY STATUS COMMUNICATION OF NEEDS Skin   Extensive Assist Verbally Normal                       Personal Care Assistance Level of Assistance  Bathing, Feeding, Dressing Bathing Assistance: Limited assistance Feeding assistance: Independent Dressing Assistance: Limited assistance     Functional Limitations Info  Sight, Hearing, Speech Sight Info: Adequate (Wears glasses) Hearing Info: Adequate Speech Info: Adequate    SPECIAL CARE FACTORS FREQUENCY  PT (By licensed PT), OT (By licensed OT)     PT Frequency: 5x/week OT Frequency: 5x/week            Contractures Contractures Info: Not present    Additional Factors Info  Code Status, Allergies Code Status Info: FULL Allergies Info: NKA           Current Medications (04/11/2024):  This is the current hospital active medication list Current Facility-Administered Medications  Medication Dose Route Frequency Provider Last Rate Last Admin   acetaminophen  (TYLENOL ) tablet 650 mg  650 mg Oral Q4H PRN Jaret, Preetha, MD       Or   acetaminophen  (TYLENOL ) 160 MG/5ML solution 650 mg  650 mg Per Tube Q4H PRN Fairy Frames, MD       Or   acetaminophen  (TYLENOL ) suppository 650 mg  650 mg Rectal Q4H PRN Fairy Frames, MD       alfuzosin  (UROXATRAL )  24 hr tablet 10 mg  10 mg Oral q morning Alva, Preetha, MD   10 mg at 04/11/24 9171   apixaban  (ELIQUIS ) tablet 5 mg  5 mg Oral BID Shinichi, Preetha, MD   5 mg at 04/11/24 9171   artificial tears ophthalmic solution 1 drop  1 drop Both Eyes TID PRN Fairy Frames, MD       carvedilol  (COREG ) tablet 3.125 mg  3.125 mg Oral BID WC Judge, Preetha, MD   3.125 mg at 04/11/24 9171   cycloSPORINE  (RESTASIS ) 0.05 % ophthalmic emulsion 1 drop  1 drop Both Eyes BID Fairy Frames, MD   1 drop at 04/11/24 9171   donepezil  (ARICEPT ) tablet 10 mg  10  mg Oral Daily Fairy Frames, MD   10 mg at 04/11/24 9171   haloperidol  lactate (HALDOL ) injection 1 mg  1 mg Intravenous Q6H PRN Winifred, Preetha, MD   1 mg at 04/07/24 1855   latanoprost  (XALATAN ) 0.005 % ophthalmic solution 1 drop  1 drop Both Eyes QHS Fairy Frames, MD   1 drop at 04/10/24 2037   melatonin tablet 3 mg  3 mg Oral QHS PRN Opyd, Timothy S, MD   3 mg at 04/10/24 2035   mirabegron  ER (MYRBETRIQ ) tablet 50 mg  50 mg Oral q morning Tyrease, Preetha, MD   50 mg at 04/11/24 0827   rosuvastatin  (CRESTOR ) tablet 20 mg  20 mg Oral Daily Fairy Frames, MD   20 mg at 04/11/24 9171   sacubitril -valsartan  (ENTRESTO ) 24-26 mg per tablet  1 tablet Oral BID Fairy Frames, MD   1 tablet at 04/11/24 9171   senna-docusate (Senokot-S) tablet 1 tablet  1 tablet Oral QHS PRN Fairy Frames, MD       sodium chloride  flush (NS) 0.9 % injection 3 mL  3 mL Intravenous Once Johnthomas, Preetha, MD       spironolactone  (ALDACTONE ) tablet 25 mg  25 mg Oral Daily Seann, Preetha, MD   25 mg at 04/11/24 9171     Discharge Medications: Please see discharge summary for a list of discharge medications.  Relevant Imaging Results:  Relevant Lab Results:   Additional Information Pt's SSN: [id number]  Sherline Clack, LCSWA

## 2024-04-11 NOTE — TOC Progression Note (Signed)
 Transition of Care [facility]) - Progression Note    Patient Details  Name: Joseph [name] MRN: [id number] Date of Birth: [birth year]  [facility] CM/SW Contact  Sherline Clack, [place] Phone Number: 04/11/2024, 4:11 PM  Clinical Narrative:     Patient's PASSR came back: [id number] A. CSW will present offers to patient and start insurance authorization.                     Expected Discharge Plan and Services         Expected Discharge Date: 04/07/24                                     Social Drivers of Health (SDOH) Interventions SDOH Screenings   Food Insecurity: No Food Insecurity (04/08/2024)  Housing: Low Risk  (04/08/2024)  Transportation Needs: No Transportation Needs (04/08/2024)  Utilities: Not At Risk (04/08/2024)  Alcohol  Screen: Low Risk  (04/12/2023)  Depression (PHQ2-9): Low Risk  (03/16/2024)  Financial Resource Strain: Low Risk  (04/12/2023)  Physical Activity: Inactive (04/12/2023)  Social Connections: Socially Integrated (04/09/2024)  Stress: No Stress Concern Present (04/12/2023)  Tobacco Use: Medium Risk (04/06/2024)  Health Literacy: Adequate Health Literacy (04/12/2023)    Readmission Risk Interventions     No data to display

## 2024-04-12 DIAGNOSIS — R55 Syncope and collapse: Secondary | ICD-10-CM | POA: Diagnosis not present

## 2024-04-12 LAB — BASIC METABOLIC PANEL WITH GFR
Anion gap: 10 (ref 5–15)
BUN: 27 mg/dL — ABNORMAL HIGH (ref 8–23)
CO2: 22 mmol/L (ref 22–32)
Calcium: 8.6 mg/dL — ABNORMAL LOW (ref 8.9–10.3)
Chloride: 106 mmol/L (ref 98–111)
Creatinine, Ser: 1.65 mg/dL — ABNORMAL HIGH (ref 0.61–1.24)
GFR, Estimated: 43 mL/min — ABNORMAL LOW (ref >60)
Glucose, Bld: 106 mg/dL — ABNORMAL HIGH (ref 70–99)
Potassium: 4.5 mmol/L (ref 3.5–5.1)
Sodium: 138 mmol/L (ref 135–145)

## 2024-04-12 NOTE — Plan of Care (Signed)

## 2024-04-12 NOTE — Discharge Summary (Signed)
 Physician Discharge Summary  Joseph [name] FMW:[id number] DOB: July 12, 1946 DOA: 04/06/2024  PCP: Joseph Wilhelmena Lloyd Hilario, FNP  Admit date: 04/06/2024 Discharge date: 04/12/2024  Time spent: 45 minutes  Recommendations for Outpatient Follow-up:  PCP in 1 week SNF for short-term rehab   Discharge Diagnoses:  Principal Problem:   Syncope and collapse Dementia Chronic diastolic CHF Paroxysmal atrial fibrillation AKI on CKD 3a Hyperkalemia  Discharge Condition: Improved  Diet recommendation: Sodium, heart healthy  Filed Weights   04/06/24 1100  Weight: 103.8 kg    History of present illness:  Joseph [name] is a 78/M with history of prior CVA, dementia, paroxysmal A-fib on Eliquis , diastolic CHF was getting routine lab drawn today, his wife was in the waiting room when she was called in, patient had a syncopal event with loss of consciousness, there after some facial droop and abnormal speech was noted as well which gradually resolved as he regained consciousness.  Subsequently brought to the ED as a code stroke, ED Course: Hypertensive, labs notable for potassium of 5.7, creatinine 1.9  Hospital Course:  Syncope and collapse -Transient facial droop -Appears to be a syncopal episode, possibly vasovagal  - Borderline orthostatics positive, clonidine  discontinued -Neurology consult appreciated, MRI brain and EEG unremarkable for acute findings - PT OT eval completed, SNF recommended, wife wants rehab, TOC consult - Discharge planning   Paroxysmal atrial fibrillation  - Poor compliance with Eliquis  at baseline, only takes a.m. 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 - Creatinine slightly higher than baseline on admission, now improving, resume Entresto    Hyperkalemia - Now improved, restart Aldactone    Dementia - Resume  Aricept   Discharge Exam: Vitals:   04/12/24 0427 04/12/24 0820  BP: (!) 170/88 (!) 163/93  Pulse: (!) 59 76  Resp: 20 18  Temp: 98 F (36.7 C) 98.1 F (36.7 C)  SpO2: 97% 96%   General exam: Appears calm and comfortable, cognitive deficits, no distress Respiratory system: Clear to auscultation Cardiovascular system: S1 & S2 heard, RRR.  Abd: nondistended, soft and nontender.Normal bowel sounds heard. Central nervous system: Alert and oriented X2. No focal neurological deficits. Extremities: no edema Skin: No rashes Psychiatry: Flat affect  Discharge Instructions   Discharge Instructions     Diet - low sodium heart healthy   Complete by: As directed    Increase activity slowly   Complete by: As directed       Allergies as of 04/12/2024   No Known Allergies      Medication List     STOP taking these medications    cloNIDine  0.1 MG tablet Commonly known as: Catapres    doxazosin  4 MG tablet Commonly known as: CARDURA        TAKE these medications    alfuzosin  10 MG 24 hr tablet Commonly known as: UROXATRAL  Take 1 tablet (10 mg total) by mouth every morning.   apixaban  5 MG Tabs tablet Commonly known as: Eliquis  Take 1 tablet (5 mg total) by mouth 2 (two) times daily.   carvedilol  12.5 MG tablet Commonly known as: COREG  TAKE 1 TABLET(12.5 MG) BY MOUTH TWICE DAILY   cholecalciferol  25 MCG (1000 UNIT) tablet Commonly known as: VITAMIN D3 Take 1 tablet (1,000 Units total) by mouth daily. What changed: when to take this   cycloSPORINE  0.05 % ophthalmic emulsion Commonly known as: RESTASIS  Place  1 drop into both eyes 2 (two) times daily. What changed:  when to take this reasons to take this   donepezil  10 MG tablet Commonly known as: ARICEPT  Take 10 mg by mouth in the morning.   Entresto  24-26 MG Generic drug: sacubitril -valsartan  TAKE 1 TABLET BY MOUTH TWICE DAILY   fluticasone  50 MCG/ACT nasal spray Commonly known as: FLONASE  Place 2  sprays into both nostrils daily. What changed:  when to take this reasons to take this   hydrochlorothiazide  25 MG tablet Commonly known as: HYDRODIURIL  TAKE 1 TABLET(25 MG) BY MOUTH DAILY What changed: See the new instructions.   latanoprost  0.005 % ophthalmic solution Commonly known as: XALATAN  Place 1 drop into both eyes at bedtime. What changed:  when to take this reasons to take this   levocetirizine 5 MG tablet Commonly known as: XYZAL  TAKE 1 TABLET(5 MG) BY MOUTH EVERY EVENING   Lubricant Eye Drops 0.4-0.3 % Soln Generic drug: Polyethyl Glycol-Propyl Glycol Place 1 drop into both eyes 3 (three) times daily as needed (dry/irritated eyes.).   mirabegron  ER 50 MG Tb24 tablet Commonly known as: Myrbetriq  Take 1 tablet (50 mg total) by mouth every morning.   multivitamin with minerals Tabs tablet Take 1 tablet by mouth in the morning.   OMEGA 3 PO Take 1 tablet by mouth in the morning.   PROSTATE HEALTH PO Take 1 capsule by mouth in the morning. Super Beta Prostate   rosuvastatin  20 MG tablet Commonly known as: Crestor  Take 1 tablet (20 mg total) by mouth daily. What changed: when to take this   spironolactone  25 MG tablet Commonly known as: ALDACTONE  Take 0.5 tablets (12.5 mg total) by mouth daily. What changed: when to take this   UNABLE TO FIND Take 1 capsule by mouth in the morning. OTC Brain Health Medication       No Known Allergies    The results of significant diagnostics from this hospitalization (including imaging, microbiology, ancillary and laboratory) are listed below for reference.    Significant Diagnostic Studies: VAS US  CAROTID (at [facility] and [facility] only) Result Date: 04/07/2024 Carotid Arterial Duplex Study Patient Name:  ISSIAH [name] [facility]  Date of Exam:   04/07/2024 Medical Rec #: [id number]          Accession #:    [id number] Date of Birth: 02-Jan-1946           Patient Gender: M Patient Age:   52 years Exam Location:  [facility]  Procedure:      VAS US  CAROTID Referring Phys: Joseph [name] --------------------------------------------------------------------------------  Indications: CVA. Performing Technologist: Joseph [name] RVT, RDMS  Examination Guidelines: A complete evaluation includes B-mode imaging, spectral Doppler, color Doppler, and power Doppler as needed of all accessible portions of each vessel. Bilateral testing is considered an integral part of a complete examination. Limited examinations for reoccurring indications may be performed as noted.  Right Carotid Findings: +----------+--------+--------+--------+------------------+------------------+           PSV cm/sEDV cm/sStenosisPlaque DescriptionComments           +----------+--------+--------+--------+------------------+------------------+ CCA Prox  74      6                                 intimal thickening +----------+--------+--------+--------+------------------+------------------+ CCA Distal51      12  intimal thickening +----------+--------+--------+--------+------------------+------------------+ ICA Prox  33      9                                 intimal thickening +----------+--------+--------+--------+------------------+------------------+ ICA Distal53      17                                                   +----------+--------+--------+--------+------------------+------------------+ ECA       69      6                                                    +----------+--------+--------+--------+------------------+------------------+ +----------+--------+-------+----------------+-------------------+           PSV cm/sEDV cmsDescribe        Arm Pressure (mmHG) +----------+--------+-------+----------------+-------------------+ [id number]            Multiphasic, WNL                    +----------+--------+-------+----------------+-------------------+  +---------+--------+--+--------+-+---------+ VertebralPSV cm/s25EDV cm/s7Antegrade +---------+--------+--+--------+-+---------+  Left Carotid Findings: +----------+--------+--------+--------+------------------+------------------+           PSV cm/sEDV cm/sStenosisPlaque DescriptionComments           +----------+--------+--------+--------+------------------+------------------+ CCA Prox  56      9               hypoechoic        intimal thickening +----------+--------+--------+--------+------------------+------------------+ CCA Distal64      11                                intimal thickening +----------+--------+--------+--------+------------------+------------------+ ICA Prox  29      9                                                    +----------+--------+--------+--------+------------------+------------------+ ICA Distal61      21                                                   +----------+--------+--------+--------+------------------+------------------+ ECA       79      10                                                   +----------+--------+--------+--------+------------------+------------------+ +----------+--------+--------+----------------+-------------------+           PSV cm/sEDV cm/sDescribe        Arm Pressure (mmHG) +----------+--------+--------+----------------+-------------------+ [id number]             Multiphasic, WNL                    +----------+--------+--------+----------------+-------------------+ +---------+--------+--+--------+-+---------+ VertebralPSV cm/s26EDV cm/s7Antegrade +---------+--------+--+--------+-+---------+   Summary: Right  Carotid: The extracranial vessels were near-normal with only minimal wall                thickening or plaque. Left Carotid: The extracranial vessels were near-normal with only minimal wall               thickening or plaque. Vertebrals:  Bilateral vertebral arteries demonstrate  antegrade flow. Subclavians: Normal flow hemodynamics were seen in bilateral subclavian              arteries. *See table(s) above for measurements and observations.  Electronically signed by Eather Popp MD on 04/07/2024 at 5:00:08 PM.    Final    MR BRAIN WO CONTRAST Result Date: 04/07/2024 CLINICAL DATA:  Provided history: Neuro deficit, acute, stroke suspected. EXAM: MRI HEAD WITHOUT CONTRAST TECHNIQUE: Multiplanar, multiecho pulse sequences of the brain and surrounding structures were obtained without intravenous contrast. COMPARISON:  Non-contrast head CT and CT angiogram head/neck 04/06/2024. Brain MRI 06/15/2023. FINDINGS: Intermittently motion degraded examination. Most notably, the sagittal T1 sequence is severely motion degraded and the coronal T2 sequence is moderately motion degraded. Within this limitation, findings are as follows. Brain: Generalized cerebral atrophy. Numerous chronic lacunar infarcts within the bilateral cerebral hemispheric white matter, within/about the bilateral deep gray nuclei, within the right aspect of the pons, within the right middle cerebellar peduncle and left cerebellar hemisphere. Several lacunar infarcts are new as compared to the MRI of 06/15/2023. For instance, bilateral thalamic lacunar infarcts have progressed in number. Multifocal T2 FLAIR hyperintense signal abnormality elsewhere within the cerebral white matter and pons, nonspecific but compatible with chronic small vessel ischemic disease. Numerous chronic microhemorrhages scattered within the supratentorial and infratentorial brain, similar to the prior MRI There is no acute infarct. No evidence of an intracranial mass. No extra-axial fluid collection. No midline shift. Vascular: Maintained flow voids within the proximal large arterial vessels. Skull and upper cervical spine: No focal worrisome marrow lesion. Sinuses/Orbits: No mass or acute finding within the imaged orbits. No significant paranasal sinus  disease. IMPRESSION: 1. Intermittently motion degraded examination. 2. No evidence of an acute intracranial abnormality. The diffusion-weighted imaging is of good quality and there is no evidence of an acute infarct. 3. Parenchymal atrophy and advanced chronic small vessel ischemic disease (with numerous chronic lacunar infarcts and chronic microhemorrhages) as described. Electronically Signed   By: Rockey Childs D.O.   On: 04/07/2024 13:49   ECHOCARDIOGRAM COMPLETE Result Date: 04/07/2024    ECHOCARDIOGRAM REPORT   Patient Name:   LARY ECKARDT Date of Exam: 04/07/2024 Medical Rec #:  [id number]         Height:       69.0 in Accession #:    [id number]        Weight:       228.8 lb Date of Birth:  [birth year]          BSA:          2.188 m Patient Age:    78 years          BP:           158/82 mmHg Patient Gender: M                 HR:           68 bpm. Exam Location:  Inpatient Procedure: 2D Echo, Cardiac Doppler, Color Doppler and Intracardiac            Opacification Agent (Both Spectral and Color Flow Doppler  were            utilized during procedure). Indications:    Stroke  History:        Patient has prior history of Echocardiogram examinations, most                 recent 02/04/2024. Cardiomyopathy and CHF, Abnormal ECG and                 Pacemaker, Stroke, Arrythmias:RBBB, Bradycardia, Atrial                 Fibrillation and Atrial Flutter, Signs/Symptoms:Syncope; Risk                 Factors:Hypertension.  Sonographer:    Ellouise Mose RDCS Referring Phys: [name] FRAMES  Sonographer Comments: Technically difficult study due to poor echo windows, Technically challenging study due to limited acoustic windows, suboptimal apical window and no subcostal window. Image acquisition challenging due to patient body habitus. Suboptimal study despite attempt to turn patient. IMPRESSIONS  1. Left ventricular ejection fraction, by estimation, is 40 to 45%. The left ventricle has mildly decreased function. The left  ventricle demonstrates regional wall motion abnormalities (see scoring diagram/findings for description). There is mild left ventricular hypertrophy. Left ventricular diastolic parameters are consistent with Grade I diastolic dysfunction (impaired relaxation).  2. Right ventricular systolic function is normal. The right ventricular size is normal. Tricuspid regurgitation signal is inadequate for assessing PA pressure.  3. The mitral valve is normal in structure. No evidence of mitral valve regurgitation. No evidence of mitral stenosis.  4. The aortic valve is normal in structure. Aortic valve regurgitation is not visualized. No aortic stenosis is present.  5. The inferior vena cava is normal in size with greater than 50% respiratory variability, suggesting right atrial pressure of 3 mmHg. Conclusion(s)/Recommendation(s): No intracardiac source of embolism detected on this transthoracic study. Consider a transesophageal echocardiogram to exclude cardiac source of embolism if clinically indicated. FINDINGS  Left Ventricle: Left ventricular ejection fraction, by estimation, is 40 to 45%. The left ventricle has mildly decreased function. The left ventricle demonstrates regional wall motion abnormalities. The left ventricular internal cavity size was normal in size. There is mild left ventricular hypertrophy. Left ventricular diastolic parameters are consistent with Grade I diastolic dysfunction (impaired relaxation).  LV Wall Scoring: The posterior wall is akinetic. Right Ventricle: The right ventricular size is normal. No increase in right ventricular wall thickness. Right ventricular systolic function is normal. Tricuspid regurgitation signal is inadequate for assessing PA pressure. Left Atrium: Left atrial size was normal in size. Right Atrium: Right atrial size was normal in size. Pericardium: There is no evidence of pericardial effusion. Mitral Valve: The mitral valve is normal in structure. No evidence of mitral  valve regurgitation. No evidence of mitral valve stenosis. Tricuspid Valve: The tricuspid valve is normal in structure. Tricuspid valve regurgitation is not demonstrated. No evidence of tricuspid stenosis. Aortic Valve: The aortic valve is normal in structure. Aortic valve regurgitation is not visualized. No aortic stenosis is present. Pulmonic Valve: The pulmonic valve was normal in structure. Pulmonic valve regurgitation is not visualized. No evidence of pulmonic stenosis. Aorta: The aortic root is normal in size and structure. Venous: The inferior vena cava is normal in size with greater than 50% respiratory variability, suggesting right atrial pressure of 3 mmHg. IAS/Shunts: No atrial level shunt detected by color flow Doppler.  LEFT VENTRICLE PLAX 2D LVIDd:         4.90 cm  Diastology LVIDs:         3.40 cm      LV e' medial:    5.66 cm/s LV PW:         1.30 cm      LV E/e' medial:  9.0 LV IVS:        1.30 cm      LV e' lateral:   7.07 cm/s LVOT diam:     2.10 cm      LV E/e' lateral: 7.2 LV SV:         53 LV SV Index:   24 LVOT Area:     3.46 cm  LV Volumes (MOD) LV vol d, MOD A2C: 68.2 ml LV vol d, MOD A4C: 153.0 ml LV vol s, MOD A2C: 41.3 ml LV vol s, MOD A4C: 78.3 ml LV SV MOD A2C:     26.9 ml LV SV MOD A4C:     153.0 ml LV SV MOD BP:      45.6 ml RIGHT VENTRICLE            IVC RV S prime:     7.07 cm/s  IVC diam: 1.80 cm TAPSE (M-mode): 1.3 cm LEFT ATRIUM             Index        RIGHT ATRIUM           Index LA diam:        3.50 cm 1.60 cm/m   RA Area:     12.20 cm LA Vol (A2C):   25.9 ml 11.84 ml/m  RA Volume:   27.60 ml  12.62 ml/m LA Vol (A4C):   40.0 ml 18.28 ml/m LA Biplane Vol: 33.2 ml 15.18 ml/m  AORTIC VALVE LVOT Vmax:   79.70 cm/s LVOT Vmean:  52.400 cm/s LVOT VTI:    0.152 m  AORTA Ao Root diam: 3.30 cm Ao Asc diam:  3.20 cm MITRAL VALVE MV Area (PHT): 3.27 cm    SHUNTS MV Decel Time: 232 msec    Systemic VTI:  0.15 m MV E velocity: 50.80 cm/s  Systemic Diam: 2.10 cm MV A velocity:  74.40 cm/s MV E/A ratio:  0.68 Oneil Parchment MD Electronically signed by Oneil Parchment MD Signature Date/Time: 04/07/2024/1:07:17 PM    Final    EEG adult Result Date: 04/07/2024 Shelton Arlin KIDD, MD     04/07/2024  5:00 PM Patient Name: DEARIS DANIS MRN: [id number] Epilepsy Attending: Arlin KIDD Shelton Referring Physician/Provider: Michaela Aisha SQUIBB, MD Date: 04/07/2024 Duration: 22.10 mins Patient history: 78 y.o. male with a history of dementia who presents with sudden mental status change in the setting of venipuncture. EEG to evaluate for seizure Level of alertness: Awake AEDs during EEG study: None Technical aspects: This EEG study was done with scalp electrodes positioned according to the 10-20 International system of electrode placement. Electrical activity was reviewed with band pass filter of 1-70Hz , sensitivity of 7 uV/mm, display speed of 75mm/sec with a 60Hz  notched filter applied as appropriate. EEG data were recorded continuously and digitally stored.  Video monitoring was available and reviewed as appropriate. Description: The posterior dominant rhythm consists of 8 Hz activity of moderate voltage (25-35 uV) seen predominantly in posterior head regions, symmetric and reactive to eye opening and eye closing. Hyperventilation and photic stimulation were not performed.   IMPRESSION: This study is within normal limits. No seizures or epileptiform discharges were seen throughout the recording. A normal interictal EEG does not  exclude the diagnosis of epilepsy. Priyanka MALVA Krebs   CT ANGIO HEAD NECK W WO CM (CODE STROKE) Result Date: 04/06/2024 CLINICAL DATA:  Neuro deficit, acute, stroke suspected. Right-sided weakness and facial droop. Altered mental status. EXAM: CT ANGIOGRAPHY HEAD AND NECK WITH AND WITHOUT CONTRAST TECHNIQUE: Multidetector CT imaging of the head and neck was performed using the standard protocol during bolus administration of intravenous contrast. Multiplanar CT image  reconstructions and MIPs were obtained to evaluate the vascular anatomy. Carotid stenosis measurements (when applicable) are obtained utilizing NASCET criteria, using the distal internal carotid diameter as the denominator. RADIATION DOSE REDUCTION: This exam was performed according to the departmental dose-optimization program which includes automated exposure control, adjustment of the mA and/or kV according to patient size and/or use of iterative reconstruction technique. CONTRAST:  75mL OMNIPAQUE  IOHEXOL  350 MG/ML SOLN COMPARISON:  None Available. FINDINGS: CTA NECK FINDINGS Aortic arch: Normal variant aortic arch branching pattern with common origin of the brachiocephalic and left common carotid arteries. Mild atherosclerotic calcification in the aortic arch and proximal subclavian arteries. Widely patent brachiocephalic and subclavian arteries. Right carotid system: Patent without evidence of stenosis or dissection. Left carotid system: Patent without evidence of stenosis or dissection. Small amount of nonstenotic calcified plaque in the proximal ICA. Vertebral arteries: Patent with the right being mildly dominant. Minimal atherosclerosis at the vertebral origins. No evidence of a significant stenosis or dissection. Skeleton: No acute osseous abnormality or suspicious lesion. Other neck: Asymmetrically enlarged and heterogeneous left thyroid  lobe with a potential 4 cm nodule centered inferiorly with assessment limited by shoulder artifact. No evidence of cervical lymphadenopathy. Upper chest: Clear lung apices. Review of the MIP images confirms the above findings CTA HEAD FINDINGS Anterior circulation: The internal carotid arteries are patent from skull base to carotid termini with calcified plaque resulting in mild stenosis on the right at the level of the anterior genu. An aneurysm projecting posteriorly and inferiorly from the right supraclinoid ICA measures 6 x 3 mm. ACAs and MCAs are patent without  evidence of a proximal branch occlusion or significant proximal stenosis. Posterior circulation: The intracranial vertebral arteries are widely patent to the basilar. Patent PICA, AICA, and SCA origins are visualized bilaterally. The basilar artery is widely patent. There are small left and diminutive or absent/occluded right posterior communicating arteries. Both PCAs are patent without evidence of a significant proximal stenosis. No aneurysm is identified. Venous sinuses: As permitted by contrast timing, patent. Anatomic variants: None. Review of the MIP images confirms the above findings The finding of no large vessel occlusion was communicated to Dr. Michaela at 12:14 pm on 04/06/2024 by text page via the [facility] messaging system. IMPRESSION: 1. No large vessel occlusion. 2. Mild intracranial right ICA stenosis. 3. 6 mm right supraclinoid ICA aneurysm. 4. Widely patent cervical carotid and vertebral arteries. 5. Possible 4 cm left thyroid  nodule. Recommend non-emergent thyroid  ultrasound. Reference: J Am Coll Radiol. 2015 Feb;12(2): 143-50 6.  Aortic Atherosclerosis (ICD10-I70.0). Electronically Signed   By: Dasie Hamburg M.D.   On: 04/06/2024 12:25   CT HEAD CODE STROKE WO CONTRAST Result Date: 04/06/2024 EXAM: CT HEAD WITHOUT CONTRAST 04/06/2024 11:26:06 AM TECHNIQUE: CT of the head was performed without the administration of intravenous contrast. Automated exposure control, iterative reconstruction, and/or weight based adjustment of the mA/kV was utilized to reduce the radiation dose to as low as reasonably achievable. COMPARISON: CT of the head dated 04/21/2001. CLINICAL HISTORY: Neuro deficit, acute, stroke suspected. Right weakness. AMS. Right Facial droop. [id number].  FINDINGS: BRAIN AND VENTRICLES: No acute hemorrhage. No evidence of acute infarct. No hydrocephalus. No extra-axial collection. No mass effect or midline shift. There is age-related atrophy and marked periventricular and deep cerebral white  matter disease. There are chronic lacunar infarcts within the Thalami bilaterally and within the right paracentral pons. [place] stroke program early CT (ASPECT) score: Ganglionic (caudate, internal capsule, Lentiform Nucleus, insula, M1-M3): 7. Supraganglionic (M4-M6): 3. Total: 10. ORBITS: No acute abnormality. SINUSES: No acute abnormality. SOFT TISSUES AND SKULL: No acute soft tissue abnormality. No skull fracture. IMPRESSION: 1. No acute intracranial abnormality. 2. 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 3. 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 Electronically signed by: Evalene Coho MD 04/06/2024 11:33 AM EDT RP Workstation: [id number]    Microbiology: No results found for this or any previous visit (from the past 240 hours).   Labs: Basic Metabolic Panel: Recent Labs  Lab 04/08/24 0147 04/09/24 0436 04/10/24 0208 04/11/24 0433 04/12/24 0213  NA 136 139 135 135 138  K 3.8 4.3 4.0 4.0 4.5  CL 104 106 106 105 106  CO2 23 22 22 23 22   GLUCOSE 95 101* 110* 98 106*  BUN 21 25* 24* 18 27*  CREATININE 1.31* 1.54* 1.59* 1.19 1.65*  CALCIUM  8.6* 8.7* 8.6* 8.7* 8.6*   Liver Function Tests: Recent Labs  Lab 04/06/24 1456 04/07/24 0530  AST 23 17  ALT 15 12  ALKPHOS 61 52  BILITOT 1.5* 1.4*  PROT 6.7 6.0*  ALBUMIN 3.6 3.1*   No results for input(s): LIPASE, AMYLASE in the last 168 hours. No results for input(s): AMMONIA in the last 168 hours. CBC: Recent Labs  Lab 04/06/24 1159 04/06/24 1207 04/07/24 0530  WBC 3.8*  --  5.6  NEUTROABS 2.1  --   --   HGB 12.8* 14.3 12.1*  HCT 41.6 42.0 38.8*  MCV 70.9*  --  70.4*  PLT 113*  --  179   Cardiac Enzymes: No results for input(s): CKTOTAL, CKMB, CKMBINDEX, TROPONINI in the last 168 hours. BNP: BNP (last 3 results) No results for input(s): BNP in the last 8760 hours.  ProBNP (last 3 results) No results for  input(s): PROBNP in the last 8760 hours.  CBG: Recent Labs  Lab 04/06/24 1118  GLUCAP 118*       Signed:  Sigurd Pac MD.  [facility] 04/12/2024, 11:16 AM

## 2024-04-12 NOTE — TOC Progression Note (Addendum)
 Transition of Care [facility]) - Progression Note    Patient Details  Name: Joseph [name] MRN: [id number] Date of Birth: [birth year]  [facility] CM/SW Contact  Sherline Clack, [place] Phone Number: 04/12/2024, 11:27 AM  Clinical Narrative:     Update 3:53 PM: [name] has a bed for tomorrow. Provider and family made aware.   Update 3:07 PM: Auth approved 10/1 - 10/3, plan auth ID# [id number]. Family informed.   Update 1:36 PM: CSW reached out to CMA Jon Cruel for help with patient's insurance auth. CMA submitted auth and it is currently pending. [name] ID# [id number]   Spoke with patient and wife at bedside and presented facility list with Medicare ratings. Patient and wife indicated [name] is their choice for discharge. CSW will accept facility in the hub and start insurance authorization.                     Expected Discharge Plan and Services         Expected Discharge Date: 04/12/24                                     Social Drivers of Health (SDOH) Interventions SDOH Screenings   Food Insecurity: No Food Insecurity (04/08/2024)  Housing: Low Risk  (04/08/2024)  Transportation Needs: No Transportation Needs (04/08/2024)  Utilities: Not At Risk (04/08/2024)  Alcohol  Screen: Low Risk  (04/12/2023)  Depression (PHQ2-9): Low Risk  (03/16/2024)  Financial Resource Strain: Low Risk  (04/12/2023)  Physical Activity: Inactive (04/12/2023)  Social Connections: Socially Integrated (04/09/2024)  Stress: No Stress Concern Present (04/12/2023)  Tobacco Use: Medium Risk (04/06/2024)  Health Literacy: Adequate Health Literacy (04/12/2023)    Readmission Risk Interventions     No data to display

## 2024-04-12 NOTE — Plan of Care (Signed)
   Problem: Education: Goal: Knowledge of General Education information will improve Description Including pain rating scale, medication(s)/side effects and non-pharmacologic comfort measures Outcome: Progressing

## 2024-04-13 DIAGNOSIS — Z9852 Vasectomy status: Secondary | ICD-10-CM | POA: Diagnosis not present

## 2024-04-13 DIAGNOSIS — N401 Enlarged prostate with lower urinary tract symptoms: Secondary | ICD-10-CM | POA: Diagnosis not present

## 2024-04-13 DIAGNOSIS — I5189 Other ill-defined heart diseases: Secondary | ICD-10-CM | POA: Diagnosis not present

## 2024-04-13 DIAGNOSIS — I130 Hypertensive heart and chronic kidney disease with heart failure and stage 1 through stage 4 chronic kidney disease, or unspecified chronic kidney disease: Secondary | ICD-10-CM | POA: Diagnosis present

## 2024-04-13 DIAGNOSIS — Z8673 Personal history of transient ischemic attack (TIA), and cerebral infarction without residual deficits: Secondary | ICD-10-CM | POA: Diagnosis not present

## 2024-04-13 DIAGNOSIS — Z8249 Family history of ischemic heart disease and other diseases of the circulatory system: Secondary | ICD-10-CM | POA: Diagnosis not present

## 2024-04-13 DIAGNOSIS — I480 Paroxysmal atrial fibrillation: Secondary | ICD-10-CM | POA: Diagnosis present

## 2024-04-13 DIAGNOSIS — R5383 Other fatigue: Secondary | ICD-10-CM | POA: Diagnosis not present

## 2024-04-13 DIAGNOSIS — R0989 Other specified symptoms and signs involving the circulatory and respiratory systems: Secondary | ICD-10-CM | POA: Diagnosis not present

## 2024-04-13 DIAGNOSIS — R569 Unspecified convulsions: Secondary | ICD-10-CM | POA: Diagnosis not present

## 2024-04-13 DIAGNOSIS — R404 Transient alteration of awareness: Secondary | ICD-10-CM | POA: Diagnosis present

## 2024-04-13 DIAGNOSIS — R4020 Unspecified coma: Secondary | ICD-10-CM | POA: Diagnosis present

## 2024-04-13 DIAGNOSIS — Z87891 Personal history of nicotine dependence: Secondary | ICD-10-CM | POA: Diagnosis not present

## 2024-04-13 DIAGNOSIS — M6259 Muscle wasting and atrophy, not elsewhere classified, multiple sites: Secondary | ICD-10-CM | POA: Diagnosis not present

## 2024-04-13 DIAGNOSIS — G9340 Encephalopathy, unspecified: Secondary | ICD-10-CM | POA: Diagnosis present

## 2024-04-13 DIAGNOSIS — I10 Essential (primary) hypertension: Secondary | ICD-10-CM | POA: Diagnosis not present

## 2024-04-13 DIAGNOSIS — M6281 Muscle weakness (generalized): Secondary | ICD-10-CM | POA: Diagnosis not present

## 2024-04-13 DIAGNOSIS — R41841 Cognitive communication deficit: Secondary | ICD-10-CM | POA: Diagnosis not present

## 2024-04-13 DIAGNOSIS — I6381 Other cerebral infarction due to occlusion or stenosis of small artery: Secondary | ICD-10-CM | POA: Diagnosis not present

## 2024-04-13 DIAGNOSIS — G459 Transient cerebral ischemic attack, unspecified: Secondary | ICD-10-CM | POA: Diagnosis not present

## 2024-04-13 DIAGNOSIS — R2681 Unsteadiness on feet: Secondary | ICD-10-CM | POA: Diagnosis not present

## 2024-04-13 DIAGNOSIS — E785 Hyperlipidemia, unspecified: Secondary | ICD-10-CM | POA: Diagnosis present

## 2024-04-13 DIAGNOSIS — Z7901 Long term (current) use of anticoagulants: Secondary | ICD-10-CM | POA: Diagnosis not present

## 2024-04-13 DIAGNOSIS — R278 Other lack of coordination: Secondary | ICD-10-CM | POA: Diagnosis not present

## 2024-04-13 DIAGNOSIS — Z818 Family history of other mental and behavioral disorders: Secondary | ICD-10-CM | POA: Diagnosis not present

## 2024-04-13 DIAGNOSIS — I5032 Chronic diastolic (congestive) heart failure: Secondary | ICD-10-CM | POA: Diagnosis not present

## 2024-04-13 DIAGNOSIS — N1831 Chronic kidney disease, stage 3a: Secondary | ICD-10-CM | POA: Diagnosis present

## 2024-04-13 DIAGNOSIS — Z823 Family history of stroke: Secondary | ICD-10-CM | POA: Diagnosis not present

## 2024-04-13 DIAGNOSIS — N3281 Overactive bladder: Secondary | ICD-10-CM | POA: Diagnosis present

## 2024-04-13 DIAGNOSIS — Z1152 Encounter for screening for COVID-19: Secondary | ICD-10-CM | POA: Diagnosis not present

## 2024-04-13 DIAGNOSIS — I4891 Unspecified atrial fibrillation: Secondary | ICD-10-CM | POA: Diagnosis not present

## 2024-04-13 DIAGNOSIS — Z7401 Bed confinement status: Secondary | ICD-10-CM | POA: Diagnosis not present

## 2024-04-13 DIAGNOSIS — R531 Weakness: Secondary | ICD-10-CM | POA: Diagnosis not present

## 2024-04-13 DIAGNOSIS — G8191 Hemiplegia, unspecified affecting right dominant side: Secondary | ICD-10-CM | POA: Diagnosis present

## 2024-04-13 DIAGNOSIS — G4700 Insomnia, unspecified: Secondary | ICD-10-CM | POA: Diagnosis present

## 2024-04-13 DIAGNOSIS — N400 Enlarged prostate without lower urinary tract symptoms: Secondary | ICD-10-CM | POA: Diagnosis present

## 2024-04-13 DIAGNOSIS — Z79899 Other long term (current) drug therapy: Secondary | ICD-10-CM | POA: Diagnosis not present

## 2024-04-13 DIAGNOSIS — R55 Syncope and collapse: Secondary | ICD-10-CM | POA: Diagnosis not present

## 2024-04-13 DIAGNOSIS — R4782 Fluency disorder in conditions classified elsewhere: Secondary | ICD-10-CM | POA: Diagnosis present

## 2024-04-13 DIAGNOSIS — Z741 Need for assistance with personal care: Secondary | ICD-10-CM | POA: Diagnosis not present

## 2024-04-13 DIAGNOSIS — I5042 Chronic combined systolic (congestive) and diastolic (congestive) heart failure: Secondary | ICD-10-CM | POA: Diagnosis present

## 2024-04-13 DIAGNOSIS — R1310 Dysphagia, unspecified: Secondary | ICD-10-CM | POA: Diagnosis not present

## 2024-04-13 DIAGNOSIS — I509 Heart failure, unspecified: Secondary | ICD-10-CM | POA: Diagnosis not present

## 2024-04-13 DIAGNOSIS — I517 Cardiomegaly: Secondary | ICD-10-CM | POA: Diagnosis not present

## 2024-04-13 DIAGNOSIS — R29810 Facial weakness: Secondary | ICD-10-CM | POA: Diagnosis present

## 2024-04-13 DIAGNOSIS — Z950 Presence of cardiac pacemaker: Secondary | ICD-10-CM | POA: Diagnosis not present

## 2024-04-13 DIAGNOSIS — F0390 Unspecified dementia without behavioral disturbance: Secondary | ICD-10-CM | POA: Diagnosis present

## 2024-04-13 MED ORDER — MELATONIN 3 MG PO TABS: 3.0000 mg | ORAL_TABLET | Freq: Every evening | ORAL | Status: AC | PRN

## 2024-04-13 MED ORDER — TRAZODONE HCL 50 MG PO TABS: 50.0000 mg | ORAL_TABLET | Freq: Every evening | ORAL | Status: AC | PRN

## 2024-04-13 NOTE — Progress Notes (Signed)
 Occupational Therapy Treatment Patient Details Name: EVEN BUDLONG MRN: [id number] DOB: 07-27-1945 Today's Date: 04/13/2024   History of present illness KALEEL SCHMIEDER is a 78/M admitted 9/25 after pt was getting routine lab drawn today, his wife was in the waiting room when she was called in, patient had a syncopal event with loss of consciousness, some facial droop and abnormal speech was noted as well which gradually resolved as he regained consciousness.  Subsequently brought to the ED as a code stroke.  PAF,CHF, hyperkalemia. PMH:  prior CVA, dementia, paroxysmal A-fib on Eliquis , diastolic CHF   OT comments  Patient seated on BSC upon entry. Patient demonstrating gains with sit to stands and standing balance during self care tasks. Patient able to assist with toilet hygiene and able to stand at sink for grooming tasks. Patient will benefit from continued inpatient follow up therapy, <3 hours/day. Acute OT to continue to follow to address established goals to facilitate DC to next venue of care.        If plan is discharge home, recommend the following:  A little help with walking and/or transfers;A lot of help with bathing/dressing/bathroom;Assistance with cooking/housework;Direct supervision/assist for financial management;Assist for transportation;Help with stairs or ramp for entrance;Direct supervision/assist for medications management   Equipment Recommendations  BSC/3in1;Other (comment) (other TBD based on pt progress)    Recommendations for Other Services      Precautions / Restrictions Precautions Precautions: Fall Precaution/Restrictions Comments: watch BP Restrictions Weight Bearing Restrictions Per Provider Order: No       Mobility Bed Mobility Overal bed mobility: Needs Assistance             General bed mobility comments: OOB on BSC upon entry and left in recliner at end of session    Transfers Overall transfer level: Needs assistance Equipment used:  Rolling walker (2 wheels), 2 person hand held assist Transfers: Sit to/from Stand, Bed to chair/wheelchair/BSC Sit to Stand: Min assist     Step pivot transfers: Min assist     General transfer comment: cues for hand placement and min assist ot stand and for balance during transfer     Balance Overall balance assessment: Needs assistance Sitting-balance support: Single extremity supported, Bilateral upper extremity supported, No upper extremity supported, Feet supported Sitting balance-Leahy Scale: Fair     Standing balance support: Single extremity supported, Bilateral upper extremity supported, During functional activity Standing balance-Leahy Scale: Poor Standing balance comment: able to stand at sink and RW with one extremity support during self care tasks                           ADL either performed or assessed with clinical judgement   ADL Overall ADL's : Needs assistance/impaired     Grooming: Wash/dry hands;Wash/dry face;Oral care;Contact guard assist;Standing Grooming Details (indicate cue type and reason): at sink                 Toilet Transfer: Minimal assistance;BSC/3in1;Rolling walker (2 wheels)   Toileting- Clothing Manipulation and Hygiene: Minimal assistance;Sit to/from stand         General ADL Comments: patient on [facility] upon entry and assist with hygiene and transfer to recliner    Extremity/Trunk Assessment              Vision       Perception     Praxis     Communication Communication Communication: Impaired Factors Affecting Communication: Difficulty expressing self;Reduced clarity of  speech   Cognition Arousal: Alert Behavior During Therapy: Flat affect Cognition: Cognition impaired, History of cognitive impairments     Awareness: Intellectual awareness intact, Online awareness impaired Memory impairment (select all impairments): Short-term memory, Working [employer] fast streamer, Engineer, structural memory Attention impairment  (select first level of impairment): Selective attention Executive functioning impairment (select all impairments): Organization, Sequencing, Reasoning, Problem solving                   Following commands: Impaired Following commands impaired: Only follows one step commands consistently      Cueing   Cueing Techniques: Verbal cues, Gestural cues, Tactile cues  Exercises      Shoulder Instructions       General Comments      Pertinent Vitals/ Pain       Pain Assessment Pain Assessment: No/denies pain  Home Living                                          Prior Functioning/Environment              Frequency  Min 2X/week        Progress Toward Goals  OT Goals(current goals can now be found in the care plan section)  Progress towards OT goals: Progressing toward goals  Acute Rehab OT Goals Patient Stated Goal: to get stronger OT Goal Formulation: With patient Time For Goal Achievement: 04/22/24 Potential to Achieve Goals: Good ADL Goals Pt Will Perform Grooming: with supervision;standing Pt Will Perform Lower Body Bathing: with contact guard assist;sitting/lateral leans;sit to/from stand Pt Will Perform Lower Body Dressing: with contact guard assist;sitting/lateral leans;sit to/from stand Pt Will Transfer to Toilet: with supervision;ambulating;regular height toilet (with least restrictive AD) Pt Will Perform Toileting - Clothing Manipulation and hygiene: with supervision;sit to/from stand  Plan      Co-evaluation                 AM-PAC OT 6 Clicks Daily Activity     Outcome Measure   Help from another person eating meals?: A Little Help from another person taking care of personal grooming?: A Little Help from another person toileting, which includes using toliet, bedpan, or urinal?: A Lot Help from another person bathing (including washing, rinsing, drying)?: A Lot Help from another person to put on and taking off regular  upper body clothing?: A Little Help from another person to put on and taking off regular lower body clothing?: A Lot 6 Click Score: 15    End of Session Equipment Utilized During Treatment: Gait belt;Rolling walker (2 wheels)  OT Visit Diagnosis: Unsteadiness on feet (R26.81);Other abnormalities of gait and mobility (R26.89);Muscle weakness (generalized) (M62.81);Other symptoms and signs involving cognitive function   Activity Tolerance Patient tolerated treatment well   Patient Left in chair;with call bell/phone within reach   Nurse Communication Mobility status        Time: 8972-8948 OT Time Calculation (min): 24 min  Charges: OT General Charges $OT Visit: 1 Visit OT Treatments $Self Care/Home Management : 23-37 mins  Dick Laine, OTA Acute Rehabilitation Services  Office [phone]   Jeb LITTIE Laine 04/13/2024, 12:55 PM

## 2024-04-13 NOTE — Progress Notes (Signed)
 PT Cancellation Note  Patient Details Name: Joseph [name] MRN: [id number] DOB: 1946/04/05   Cancelled Treatment:    Reason Eval/Treat Not Completed: Other (comment) Pt eating breakfast. PT will follow back later this morning as able.  Rachid Parham B. Fleeta Lapidus PT, DPT Acute Rehabilitation Services Please use secure chat or  Call Office [phone]    Almarie KATHEE Fleeta [facility] 04/13/2024, 9:23 AM

## 2024-04-13 NOTE — TOC Transition Note (Signed)
 Transition of Care [facility]) - Discharge Note   Patient Details  Name: Joseph [name] MRN: [id number] Date of Birth: [birth year]  Transition of Care [facility] CM/SW Contact:  Sherline Clack, LCSWA Phone Number: 04/13/2024, 11:02 AM   Clinical Narrative:     Patient will DC to: [facility] Anticipated DC date: 04/13/24  Family notified: [name]/spouse Transport by: [facility] approved 10/1 - 10/3, plan auth ID# [id number]   Per MD patient ready for DC to [facility]. RN to call report prior to discharge [phone], [phone]). RN, patient, patient's family, and facility notified of DC. Discharge Summary and FL2 sent to facility. DC packet on chart. Ambulance transport requested for patient.   CSW will sign off for now as social work intervention is no longer needed. Please consult us  again if new needs arise.    Final next level of care: Skilled Nursing Facility Barriers to Discharge: Barriers Resolved   Patient Goals and CMS Choice   CMS Medicare.gov Compare Post Acute Care list provided to:: Patient Choice offered to / list presented to : Patient, Spouse      Discharge Placement   Existing PASRR number confirmed : 04/13/24          Patient chooses bed at: [facility] Patient to be transferred to facility by: [facility] Name of family member notified: [name]/spouse Patient and family notified of of transfer: 04/13/24  Discharge Plan and Services Additional resources added to the After Visit Summary for                                       Social Drivers of Health (SDOH) Interventions SDOH Screenings   Food Insecurity: No Food Insecurity (04/08/2024)  Housing: Low Risk  (04/08/2024)  Transportation Needs: No Transportation Needs (04/08/2024)  Utilities: Not At Risk (04/08/2024)  Alcohol  Screen: Low Risk  (04/12/2023)  Depression (PHQ2-9): Low Risk  (03/16/2024)  Financial Resource Strain: Low Risk  (04/12/2023)  Physical Activity: Inactive  (04/12/2023)  Social Connections: Socially Integrated (04/09/2024)  Stress: No Stress Concern Present (04/12/2023)  Tobacco Use: Medium Risk (04/06/2024)  Health Literacy: Adequate Health Literacy (04/12/2023)     Readmission Risk Interventions     No data to display

## 2024-04-13 NOTE — Discharge Summary (Signed)
 Physician Discharge Summary   Patient: RAWSON MINIX MRN: [id number] DOB: 1945-10-14  Admit date:     04/06/2024  Discharge date: 04/13/24  Discharge Physician: Carliss LELON Canales   PCP: Terry Wilhelmena Lloyd Hilario, FNP   Recommendations at discharge:    Pt to be discharged to [facility].   If you experience worsening fever, chills, chest pain, shortness of breath, or other concerning symptoms, please call your PCP or go to the emergency department immediately.  Discharge Diagnoses: Principal Problem:   Syncope and collapse  Resolved Problems:   * No resolved hospital problems. *   Hospital Course:  AYMEN WIDRIG is a 78/M with history of prior CVA, dementia, paroxysmal A-fib on Eliquis , diastolic CHF was getting routine lab drawn today, his wife was in the waiting room when she was called in, patient had a syncopal event with loss of consciousness, there after some facial droop and abnormal speech was noted as well which gradually resolved as he regained consciousness. Subsequently brought to the ED as a code stroke,   Assessment and Plan:  Syncope and collapse -Transient facial droop -Appears to be a syncopal episode, possibly vasovagal  - Borderline orthostatics positive, clonidine  discontinued -Neurology consult appreciated, MRI brain and EEG unremarkable for acute findings - PT OT eval completed, SNF recommended   Paroxysmal atrial fibrillation  - Poor compliance with Eliquis  at baseline, only takes a.m. 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 - Creatinine slightly higher than baseline on admission, now improving, resume Entresto    Hyperkalemia - Now improved, restart Aldactone    Dementia - Resume Aricept     Consultants: Neurology Procedures performed: None Disposition: Skilled nursing facility Diet  recommendation:  Discharge Diet Orders (From admission, onward)     Start     Ordered   04/13/24 0000  Diet - low sodium heart healthy        04/13/24 1009   04/07/24 0000  Diet - low sodium heart healthy        04/07/24 1443           Cardiac diet  DISCHARGE MEDICATION: Allergies as of 04/13/2024   No Known Allergies      Medication List     STOP taking these medications    cloNIDine  0.1 MG tablet Commonly known as: Catapres    doxazosin  4 MG tablet Commonly known as: CARDURA        TAKE these medications    alfuzosin  10 MG 24 hr tablet Commonly known as: UROXATRAL  Take 1 tablet (10 mg total) by mouth every morning.   apixaban  5 MG Tabs tablet Commonly known as: Eliquis  Take 1 tablet (5 mg total) by mouth 2 (two) times daily.   carvedilol  12.5 MG tablet Commonly known as: COREG  TAKE 1 TABLET(12.5 MG) BY MOUTH TWICE DAILY   cholecalciferol  25 MCG (1000 UNIT) tablet Commonly known as: VITAMIN D3 Take 1 tablet (1,000 Units total) by mouth daily. What changed: when to take this   cycloSPORINE  0.05 % ophthalmic emulsion Commonly known as: RESTASIS  Place 1 drop into both eyes 2 (two) times daily. What changed:  when to take this reasons to take this   donepezil  10 MG tablet Commonly known as: ARICEPT  Take 10 mg by mouth in the morning.   Entresto  24-26 MG Generic drug: sacubitril -valsartan  TAKE 1 TABLET BY MOUTH TWICE DAILY  fluticasone  50 MCG/ACT nasal spray Commonly known as: FLONASE  Place 2 sprays into both nostrils daily. What changed:  when to take this reasons to take this   hydrochlorothiazide  25 MG tablet Commonly known as: HYDRODIURIL  TAKE 1 TABLET(25 MG) BY MOUTH DAILY What changed: See the new instructions.   latanoprost  0.005 % ophthalmic solution Commonly known as: XALATAN  Place 1 drop into both eyes at bedtime. What changed:  when to take this reasons to take this   levocetirizine 5 MG tablet Commonly known as:  XYZAL  TAKE 1 TABLET(5 MG) BY MOUTH EVERY EVENING   Lubricant Eye Drops 0.4-0.3 % Soln Generic drug: Polyethyl Glycol-Propyl Glycol Place 1 drop into both eyes 3 (three) times daily as needed (dry/irritated eyes.).   melatonin 3 MG Tabs tablet Take 1 tablet (3 mg total) by mouth at bedtime as needed.   mirabegron  ER 50 MG Tb24 tablet Commonly known as: Myrbetriq  Take 1 tablet (50 mg total) by mouth every morning.   multivitamin with minerals Tabs tablet Take 1 tablet by mouth in the morning.   OMEGA 3 PO Take 1 tablet by mouth in the morning.   PROSTATE HEALTH PO Take 1 capsule by mouth in the morning. Super Beta Prostate   rosuvastatin  20 MG tablet Commonly known as: Crestor  Take 1 tablet (20 mg total) by mouth daily. What changed: when to take this   spironolactone  25 MG tablet Commonly known as: ALDACTONE  Take 0.5 tablets (12.5 mg total) by mouth daily. What changed: when to take this   traZODone 50 MG tablet Commonly known as: DESYREL Take 1 tablet (50 mg total) by mouth at bedtime as needed for sleep.   UNABLE TO FIND Take 1 capsule by mouth in the morning. OTC Brain Health Medication         Discharge Exam: Filed Weights   04/06/24 1100  Weight: 103.8 kg    GENERAL:  Alert, pleasant, no acute distress  HEENT:  EOMI CARDIOVASCULAR:  RRR, no murmurs appreciated RESPIRATORY:  Clear to auscultation, no wheezing, rales, or rhonchi GASTROINTESTINAL:  Soft, nontender, nondistended EXTREMITIES:  No LE edema bilaterally NEURO:  No new focal deficits appreciated SKIN:  No rashes noted PSYCH:  Appropriate mood and affect     Condition at discharge: improving  The results of significant diagnostics from this hospitalization (including imaging, microbiology, ancillary and laboratory) are listed below for reference.   Imaging Studies: VAS US  CAROTID (at [facility] and [facility] only) Result Date: 04/07/2024 Carotid Arterial Duplex Study Patient Name:  REYES ALDACO [facility]   Date of Exam:   04/07/2024 Medical Rec #: [id number]          Accession #:    [id number] Date of Birth: [birth year]           Patient Gender: M Patient Age:   78 years Exam Location:  [facility] Procedure:      VAS US  CAROTID Referring Phys: SIGURD FAIRY --------------------------------------------------------------------------------  Indications: CVA. Performing Technologist: Ezzie Potters RVT, RDMS  Examination Guidelines: A complete evaluation includes B-mode imaging, spectral Doppler, color Doppler, and power Doppler as needed of all accessible portions of each vessel. Bilateral testing is considered an integral part of a complete examination. Limited examinations for reoccurring indications may be performed as noted.  Right Carotid Findings: +----------+--------+--------+--------+------------------+------------------+           PSV cm/sEDV cm/sStenosisPlaque DescriptionComments           +----------+--------+--------+--------+------------------+------------------+ CCA Prox  74      6  intimal thickening +----------+--------+--------+--------+------------------+------------------+ CCA Distal51      12                                intimal thickening +----------+--------+--------+--------+------------------+------------------+ ICA Prox  33      9                                 intimal thickening +----------+--------+--------+--------+------------------+------------------+ ICA Distal53      17                                                   +----------+--------+--------+--------+------------------+------------------+ ECA       69      6                                                    +----------+--------+--------+--------+------------------+------------------+ +----------+--------+-------+----------------+-------------------+           PSV cm/sEDV cmsDescribe        Arm Pressure (mmHG)  +----------+--------+-------+----------------+-------------------+ [id number]            Multiphasic, WNL                    +----------+--------+-------+----------------+-------------------+ +---------+--------+--+--------+-+---------+ VertebralPSV cm/s25EDV cm/s7Antegrade +---------+--------+--+--------+-+---------+  Left Carotid Findings: +----------+--------+--------+--------+------------------+------------------+           PSV cm/sEDV cm/sStenosisPlaque DescriptionComments           +----------+--------+--------+--------+------------------+------------------+ CCA Prox  56      9               hypoechoic        intimal thickening +----------+--------+--------+--------+------------------+------------------+ CCA Distal64      11                                intimal thickening +----------+--------+--------+--------+------------------+------------------+ ICA Prox  29      9                                                    +----------+--------+--------+--------+------------------+------------------+ ICA Distal61      21                                                   +----------+--------+--------+--------+------------------+------------------+ ECA       79      10                                                   +----------+--------+--------+--------+------------------+------------------+ +----------+--------+--------+----------------+-------------------+           PSV cm/sEDV cm/sDescribe        Arm Pressure (mmHG) +----------+--------+--------+----------------+-------------------+ [id number]  Multiphasic, WNL                    +----------+--------+--------+----------------+-------------------+ +---------+--------+--+--------+-+---------+ VertebralPSV cm/s26EDV cm/s7Antegrade +---------+--------+--+--------+-+---------+   Summary: Right Carotid: The extracranial vessels were near-normal with only minimal wall                 thickening or plaque. Left Carotid: The extracranial vessels were near-normal with only minimal wall               thickening or plaque. Vertebrals:  Bilateral vertebral arteries demonstrate antegrade flow. Subclavians: Normal flow hemodynamics were seen in bilateral subclavian              arteries. *See table(s) above for measurements and observations.  Electronically signed by Eather Popp MD on 04/07/2024 at 5:00:08 PM.    Final    MR BRAIN WO CONTRAST Result Date: 04/07/2024 CLINICAL DATA:  Provided history: Neuro deficit, acute, stroke suspected. EXAM: MRI HEAD WITHOUT CONTRAST TECHNIQUE: Multiplanar, multiecho pulse sequences of the brain and surrounding structures were obtained without intravenous contrast. COMPARISON:  Non-contrast head CT and CT angiogram head/neck 04/06/2024. Brain MRI 06/15/2023. FINDINGS: Intermittently motion degraded examination. Most notably, the sagittal T1 sequence is severely motion degraded and the coronal T2 sequence is moderately motion degraded. Within this limitation, findings are as follows. Brain: Generalized cerebral atrophy. Numerous chronic lacunar infarcts within the bilateral cerebral hemispheric white matter, within/about the bilateral deep gray nuclei, within the right aspect of the pons, within the right middle cerebellar peduncle and left cerebellar hemisphere. Several lacunar infarcts are new as compared to the MRI of 06/15/2023. For instance, bilateral thalamic lacunar infarcts have progressed in number. Multifocal T2 FLAIR hyperintense signal abnormality elsewhere within the cerebral white matter and pons, nonspecific but compatible with chronic small vessel ischemic disease. Numerous chronic microhemorrhages scattered within the supratentorial and infratentorial brain, similar to the prior MRI There is no acute infarct. No evidence of an intracranial mass. No extra-axial fluid collection. No midline shift. Vascular: Maintained flow voids within the  proximal large arterial vessels. Skull and upper cervical spine: No focal worrisome marrow lesion. Sinuses/Orbits: No mass or acute finding within the imaged orbits. No significant paranasal sinus disease. IMPRESSION: 1. Intermittently motion degraded examination. 2. No evidence of an acute intracranial abnormality. The diffusion-weighted imaging is of good quality and there is no evidence of an acute infarct. 3. Parenchymal atrophy and advanced chronic small vessel ischemic disease (with numerous chronic lacunar infarcts and chronic microhemorrhages) as described. Electronically Signed   By: Rockey Childs D.O.   On: 04/07/2024 13:49   ECHOCARDIOGRAM COMPLETE Result Date: 04/07/2024    ECHOCARDIOGRAM REPORT   Patient Name:   BERTRUM HELMSTETTER Date of Exam: 04/07/2024 Medical Rec #:  [id number]         Height:       69.0 in Accession #:    [id number]        Weight:       228.8 lb Date of Birth:  Nov 16, 1945          BSA:          2.188 m Patient Age:    78 years          BP:           158/82 mmHg Patient Gender: M                 HR:           68 bpm. Exam  Location:  Inpatient Procedure: 2D Echo, Cardiac Doppler, Color Doppler and Intracardiac            Opacification Agent (Both Spectral and Color Flow Doppler were            utilized during procedure). Indications:    Stroke  History:        Patient has prior history of Echocardiogram examinations, most                 recent 02/04/2024. Cardiomyopathy and CHF, Abnormal ECG and                 Pacemaker, Stroke, Arrythmias:RBBB, Bradycardia, Atrial                 Fibrillation and Atrial Flutter, Signs/Symptoms:Syncope; Risk                 Factors:Hypertension.  Sonographer:    Ellouise Mose RDCS Referring Phys: FAIRY FRAMES  Sonographer Comments: Technically difficult study due to poor echo windows, Technically challenging study due to limited acoustic windows, suboptimal apical window and no subcostal window. Image acquisition challenging due to patient body  habitus. Suboptimal study despite attempt to turn patient. IMPRESSIONS  1. Left ventricular ejection fraction, by estimation, is 40 to 45%. The left ventricle has mildly decreased function. The left ventricle demonstrates regional wall motion abnormalities (see scoring diagram/findings for description). There is mild left ventricular hypertrophy. Left ventricular diastolic parameters are consistent with Grade I diastolic dysfunction (impaired relaxation).  2. Right ventricular systolic function is normal. The right ventricular size is normal. Tricuspid regurgitation signal is inadequate for assessing PA pressure.  3. The mitral valve is normal in structure. No evidence of mitral valve regurgitation. No evidence of mitral stenosis.  4. The aortic valve is normal in structure. Aortic valve regurgitation is not visualized. No aortic stenosis is present.  5. The inferior vena cava is normal in size with greater than 50% respiratory variability, suggesting right atrial pressure of 3 mmHg. Conclusion(s)/Recommendation(s): No intracardiac source of embolism detected on this transthoracic study. Consider a transesophageal echocardiogram to exclude cardiac source of embolism if clinically indicated. FINDINGS  Left Ventricle: Left ventricular ejection fraction, by estimation, is 40 to 45%. The left ventricle has mildly decreased function. The left ventricle demonstrates regional wall motion abnormalities. The left ventricular internal cavity size was normal in size. There is mild left ventricular hypertrophy. Left ventricular diastolic parameters are consistent with Grade I diastolic dysfunction (impaired relaxation).  LV Wall Scoring: The posterior wall is akinetic. Right Ventricle: The right ventricular size is normal. No increase in right ventricular wall thickness. Right ventricular systolic function is normal. Tricuspid regurgitation signal is inadequate for assessing PA pressure. Left Atrium: Left atrial size was normal  in size. Right Atrium: Right atrial size was normal in size. Pericardium: There is no evidence of pericardial effusion. Mitral Valve: The mitral valve is normal in structure. No evidence of mitral valve regurgitation. No evidence of mitral valve stenosis. Tricuspid Valve: The tricuspid valve is normal in structure. Tricuspid valve regurgitation is not demonstrated. No evidence of tricuspid stenosis. Aortic Valve: The aortic valve is normal in structure. Aortic valve regurgitation is not visualized. No aortic stenosis is present. Pulmonic Valve: The pulmonic valve was normal in structure. Pulmonic valve regurgitation is not visualized. No evidence of pulmonic stenosis. Aorta: The aortic root is normal in size and structure. Venous: The inferior vena cava is normal in size with greater than 50% respiratory variability, suggesting right  atrial pressure of 3 mmHg. IAS/Shunts: No atrial level shunt detected by color flow Doppler.  LEFT VENTRICLE PLAX 2D LVIDd:         4.90 cm      Diastology LVIDs:         3.40 cm      LV e' medial:    5.66 cm/s LV PW:         1.30 cm      LV E/e' medial:  9.0 LV IVS:        1.30 cm      LV e' lateral:   7.07 cm/s LVOT diam:     2.10 cm      LV E/e' lateral: 7.2 LV SV:         53 LV SV Index:   24 LVOT Area:     3.46 cm  LV Volumes (MOD) LV vol d, MOD A2C: 68.2 ml LV vol d, MOD A4C: 153.0 ml LV vol s, MOD A2C: 41.3 ml LV vol s, MOD A4C: 78.3 ml LV SV MOD A2C:     26.9 ml LV SV MOD A4C:     153.0 ml LV SV MOD BP:      45.6 ml RIGHT VENTRICLE            IVC RV S prime:     7.07 cm/s  IVC diam: 1.80 cm TAPSE (M-mode): 1.3 cm LEFT ATRIUM             Index        RIGHT ATRIUM           Index LA diam:        3.50 cm 1.60 cm/m   RA Area:     12.20 cm LA Vol (A2C):   25.9 ml 11.84 ml/m  RA Volume:   27.60 ml  12.62 ml/m LA Vol (A4C):   40.0 ml 18.28 ml/m LA Biplane Vol: 33.2 ml 15.18 ml/m  AORTIC VALVE LVOT Vmax:   79.70 cm/s LVOT Vmean:  52.400 cm/s LVOT VTI:    0.152 m  AORTA Ao Root  diam: 3.30 cm Ao Asc diam:  3.20 cm MITRAL VALVE MV Area (PHT): 3.27 cm    SHUNTS MV Decel Time: 232 msec    Systemic VTI:  0.15 m MV E velocity: 50.80 cm/s  Systemic Diam: 2.10 cm MV A velocity: 74.40 cm/s MV E/A ratio:  0.68 Oneil Parchment MD Electronically signed by Oneil Parchment MD Signature Date/Time: 04/07/2024/1:07:17 PM    Final    EEG adult Result Date: 04/07/2024 Shelton Arlin KIDD, MD     04/07/2024  5:00 PM Patient Name: SHABAZZ MCKEY MRN: [id number] Epilepsy Attending: Arlin KIDD Shelton Referring Physician/Provider: Michaela Aisha SQUIBB, MD Date: 04/07/2024 Duration: 22.10 mins Patient history: 78 y.o. male with a history of dementia who presents with sudden mental status change in the setting of venipuncture. EEG to evaluate for seizure Level of alertness: Awake AEDs during EEG study: None Technical aspects: This EEG study was done with scalp electrodes positioned according to the 10-20 International system of electrode placement. Electrical activity was reviewed with band pass filter of 1-70Hz , sensitivity of 7 uV/mm, display speed of 66mm/sec with a 60Hz  notched filter applied as appropriate. EEG data were recorded continuously and digitally stored.  Video monitoring was available and reviewed as appropriate. Description: The posterior dominant rhythm consists of 8 Hz activity of moderate voltage (25-35 uV) seen predominantly in posterior head regions, symmetric and reactive to eye  opening and eye closing. Hyperventilation and photic stimulation were not performed.   IMPRESSION: This study is within normal limits. No seizures or epileptiform discharges were seen throughout the recording. A normal interictal EEG does not exclude the diagnosis of epilepsy. Priyanka MALVA Krebs   CT ANGIO HEAD NECK W WO CM (CODE STROKE) Result Date: 04/06/2024 CLINICAL DATA:  Neuro deficit, acute, stroke suspected. Right-sided weakness and facial droop. Altered mental status. EXAM: CT ANGIOGRAPHY HEAD AND NECK WITH AND  WITHOUT CONTRAST TECHNIQUE: Multidetector CT imaging of the head and neck was performed using the standard protocol during bolus administration of intravenous contrast. Multiplanar CT image reconstructions and MIPs were obtained to evaluate the vascular anatomy. Carotid stenosis measurements (when applicable) are obtained utilizing NASCET criteria, using the distal internal carotid diameter as the denominator. RADIATION DOSE REDUCTION: This exam was performed according to the departmental dose-optimization program which includes automated exposure control, adjustment of the mA and/or kV according to patient size and/or use of iterative reconstruction technique. CONTRAST:  75mL OMNIPAQUE  IOHEXOL  350 MG/ML SOLN COMPARISON:  None Available. FINDINGS: CTA NECK FINDINGS Aortic arch: Normal variant aortic arch branching pattern with common origin of the brachiocephalic and left common carotid arteries. Mild atherosclerotic calcification in the aortic arch and proximal subclavian arteries. Widely patent brachiocephalic and subclavian arteries. Right carotid system: Patent without evidence of stenosis or dissection. Left carotid system: Patent without evidence of stenosis or dissection. Small amount of nonstenotic calcified plaque in the proximal ICA. Vertebral arteries: Patent with the right being mildly dominant. Minimal atherosclerosis at the vertebral origins. No evidence of a significant stenosis or dissection. Skeleton: No acute osseous abnormality or suspicious lesion. Other neck: Asymmetrically enlarged and heterogeneous left thyroid  lobe with a potential 4 cm nodule centered inferiorly with assessment limited by shoulder artifact. No evidence of cervical lymphadenopathy. Upper chest: Clear lung apices. Review of the MIP images confirms the above findings CTA HEAD FINDINGS Anterior circulation: The internal carotid arteries are patent from skull base to carotid termini with calcified plaque resulting in mild  stenosis on the right at the level of the anterior genu. An aneurysm projecting posteriorly and inferiorly from the right supraclinoid ICA measures 6 x 3 mm. ACAs and MCAs are patent without evidence of a proximal branch occlusion or significant proximal stenosis. Posterior circulation: The intracranial vertebral arteries are widely patent to the basilar. Patent PICA, AICA, and SCA origins are visualized bilaterally. The basilar artery is widely patent. There are small left and diminutive or absent/occluded right posterior communicating arteries. Both PCAs are patent without evidence of a significant proximal stenosis. No aneurysm is identified. Venous sinuses: As permitted by contrast timing, patent. Anatomic variants: None. Review of the MIP images confirms the above findings The finding of no large vessel occlusion was communicated to Dr. Michaela at 12:14 pm on 04/06/2024 by text page via the [facility] messaging system. IMPRESSION: 1. No large vessel occlusion. 2. Mild intracranial right ICA stenosis. 3. 6 mm right supraclinoid ICA aneurysm. 4. Widely patent cervical carotid and vertebral arteries. 5. Possible 4 cm left thyroid  nodule. Recommend non-emergent thyroid  ultrasound. Reference: J Am Coll Radiol. 2015 Feb;12(2): 143-50 6.  Aortic Atherosclerosis (ICD10-I70.0). Electronically Signed   By: Dasie Hamburg M.D.   On: 04/06/2024 12:25   CT HEAD CODE STROKE WO CONTRAST Result Date: 04/06/2024 EXAM: CT HEAD WITHOUT CONTRAST 04/06/2024 11:26:06 AM TECHNIQUE: CT of the head was performed without the administration of intravenous contrast. Automated exposure control, iterative reconstruction, and/or weight based adjustment of  the mA/kV was utilized to reduce the radiation dose to as low as reasonably achievable. COMPARISON: CT of the head dated 04/21/2001. CLINICAL HISTORY: Neuro deficit, acute, stroke suspected. Right weakness. AMS. Right Facial droop. [id number]:10. FINDINGS: BRAIN AND VENTRICLES: No acute  hemorrhage. No evidence of acute infarct. No hydrocephalus. No extra-axial collection. No mass effect or midline shift. There is age-related atrophy and marked periventricular and deep cerebral white matter disease. There are chronic lacunar infarcts within the Thalami bilaterally and within the right paracentral pons. [place] stroke program early CT (ASPECT) score: Ganglionic (caudate, internal capsule, Lentiform Nucleus, insula, M1-M3): 7. Supraganglionic (M4-M6): 3. Total: 10. ORBITS: No acute abnormality. SINUSES: No acute abnormality. SOFT TISSUES AND SKULL: No acute soft tissue abnormality. No skull fracture. IMPRESSION: 1. No acute intracranial abnormality. 2. 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 3. 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 Electronically signed by: Evalene Coho MD 04/06/2024 11:33 AM EDT RP Workstation: [id number]    Microbiology: Results for orders placed or performed during the hospital encounter of 02/03/22  MRSA Next Gen by PCR, Nasal     Status: None   Collection Time: 02/03/22  6:40 PM   Specimen: Nasal Mucosa; Nasal Swab  Result Value Ref Range Status   MRSA by PCR Next Gen NOT DETECTED NOT DETECTED Final    Comment: (NOTE) The GeneXpert MRSA Assay (FDA approved for NASAL specimens only), is one component of a comprehensive MRSA colonization surveillance program. It is not intended to diagnose MRSA infection nor to guide or monitor treatment for MRSA infections. Test performance is not FDA approved in patients less than 33 years old. Performed at [facility], [address] [place], [place] [address]     Labs: CBC: Recent Labs  Lab 04/06/24 1159 04/06/24 1207 04/07/24 0530  WBC 3.8*  --  5.6  NEUTROABS 2.1  --   --   HGB 12.8* 14.3 12.1*  HCT 41.6 42.0 38.8*  MCV 70.9*  --  70.4*  PLT 113*  --  179   Basic Metabolic Panel: Recent Labs  Lab  04/08/24 0147 04/09/24 0436 04/10/24 0208 04/11/24 0433 04/12/24 0213  NA 136 139 135 135 138  K 3.8 4.3 4.0 4.0 4.5  CL 104 106 106 105 106  CO2 23 22 22 23 22   GLUCOSE 95 101* 110* 98 106*  BUN 21 25* 24* 18 27*  CREATININE 1.31* 1.54* 1.59* 1.19 1.65*  CALCIUM  8.6* 8.7* 8.6* 8.7* 8.6*   Liver Function Tests: Recent Labs  Lab 04/06/24 1456 04/07/24 0530  AST 23 17  ALT 15 12  ALKPHOS 61 52  BILITOT 1.5* 1.4*  PROT 6.7 6.0*  ALBUMIN 3.6 3.1*   CBG: Recent Labs  Lab 04/06/24 1118  GLUCAP 118*    Discharge time spent: 25 minutes.  Length of inpatient stay: 0 days  Signed: Carliss LELON Canales, DO [facility] 04/13/2024

## 2024-04-13 NOTE — Plan of Care (Signed)

## 2024-04-14 DIAGNOSIS — I4891 Unspecified atrial fibrillation: Secondary | ICD-10-CM | POA: Diagnosis not present

## 2024-04-14 DIAGNOSIS — G459 Transient cerebral ischemic attack, unspecified: Secondary | ICD-10-CM | POA: Diagnosis not present

## 2024-04-14 DIAGNOSIS — I509 Heart failure, unspecified: Secondary | ICD-10-CM | POA: Diagnosis not present

## 2024-04-14 DIAGNOSIS — E785 Hyperlipidemia, unspecified: Secondary | ICD-10-CM | POA: Diagnosis not present

## 2024-04-14 DIAGNOSIS — F0390 Unspecified dementia without behavioral disturbance: Secondary | ICD-10-CM | POA: Diagnosis not present

## 2024-04-15 ENCOUNTER — Inpatient Hospital Stay (HOSPITAL_COMMUNITY)
Admission: EM | Admit: 2024-04-15 | Discharge: 2024-04-19 | DRG: 884 | Disposition: A | Discharge status: Skilled Nursing Facility | Source: Skilled Nursing Facility | Attending: Internal Medicine | Admitting: Internal Medicine

## 2024-04-15 ENCOUNTER — Encounter (HOSPITAL_COMMUNITY): Payer: Self-pay

## 2024-04-15 ENCOUNTER — Emergency Department (HOSPITAL_COMMUNITY)

## 2024-04-15 DIAGNOSIS — Z1152 Encounter for screening for COVID-19: Secondary | ICD-10-CM

## 2024-04-15 DIAGNOSIS — R531 Weakness: Secondary | ICD-10-CM | POA: Diagnosis not present

## 2024-04-15 DIAGNOSIS — Z9852 Vasectomy status: Secondary | ICD-10-CM

## 2024-04-15 DIAGNOSIS — I5189 Other ill-defined heart diseases: Secondary | ICD-10-CM

## 2024-04-15 DIAGNOSIS — Z823 Family history of stroke: Secondary | ICD-10-CM

## 2024-04-15 DIAGNOSIS — E785 Hyperlipidemia, unspecified: Secondary | ICD-10-CM | POA: Diagnosis present

## 2024-04-15 DIAGNOSIS — I130 Hypertensive heart and chronic kidney disease with heart failure and stage 1 through stage 4 chronic kidney disease, or unspecified chronic kidney disease: Secondary | ICD-10-CM | POA: Diagnosis present

## 2024-04-15 DIAGNOSIS — Z8673 Personal history of transient ischemic attack (TIA), and cerebral infarction without residual deficits: Secondary | ICD-10-CM

## 2024-04-15 DIAGNOSIS — N1831 Chronic kidney disease, stage 3a: Secondary | ICD-10-CM | POA: Diagnosis not present

## 2024-04-15 DIAGNOSIS — R29810 Facial weakness: Secondary | ICD-10-CM | POA: Diagnosis present

## 2024-04-15 DIAGNOSIS — Z79899 Other long term (current) drug therapy: Secondary | ICD-10-CM

## 2024-04-15 DIAGNOSIS — F0390 Unspecified dementia without behavioral disturbance: Secondary | ICD-10-CM | POA: Diagnosis present

## 2024-04-15 DIAGNOSIS — Z87891 Personal history of nicotine dependence: Secondary | ICD-10-CM

## 2024-04-15 DIAGNOSIS — R4020 Unspecified coma: Principal | ICD-10-CM

## 2024-04-15 DIAGNOSIS — Z8249 Family history of ischemic heart disease and other diseases of the circulatory system: Secondary | ICD-10-CM

## 2024-04-15 DIAGNOSIS — Z950 Presence of cardiac pacemaker: Secondary | ICD-10-CM

## 2024-04-15 DIAGNOSIS — R4782 Fluency disorder in conditions classified elsewhere: Secondary | ICD-10-CM | POA: Diagnosis present

## 2024-04-15 DIAGNOSIS — G4700 Insomnia, unspecified: Secondary | ICD-10-CM | POA: Diagnosis present

## 2024-04-15 DIAGNOSIS — R569 Unspecified convulsions: Secondary | ICD-10-CM | POA: Diagnosis not present

## 2024-04-15 DIAGNOSIS — M6281 Muscle weakness (generalized): Secondary | ICD-10-CM | POA: Diagnosis not present

## 2024-04-15 DIAGNOSIS — I480 Paroxysmal atrial fibrillation: Secondary | ICD-10-CM | POA: Diagnosis not present

## 2024-04-15 DIAGNOSIS — I639 Cerebral infarction, unspecified: Secondary | ICD-10-CM | POA: Diagnosis present

## 2024-04-15 DIAGNOSIS — Z818 Family history of other mental and behavioral disorders: Secondary | ICD-10-CM

## 2024-04-15 DIAGNOSIS — G8191 Hemiplegia, unspecified affecting right dominant side: Secondary | ICD-10-CM | POA: Diagnosis present

## 2024-04-15 DIAGNOSIS — Z7901 Long term (current) use of anticoagulants: Secondary | ICD-10-CM

## 2024-04-15 DIAGNOSIS — G9340 Encephalopathy, unspecified: Secondary | ICD-10-CM | POA: Diagnosis present

## 2024-04-15 DIAGNOSIS — R404 Transient alteration of awareness: Principal | ICD-10-CM | POA: Diagnosis present

## 2024-04-15 DIAGNOSIS — N3281 Overactive bladder: Secondary | ICD-10-CM | POA: Diagnosis present

## 2024-04-15 DIAGNOSIS — R55 Syncope and collapse: Secondary | ICD-10-CM | POA: Diagnosis not present

## 2024-04-15 DIAGNOSIS — R4182 Altered mental status, unspecified: Secondary | ICD-10-CM | POA: Diagnosis present

## 2024-04-15 DIAGNOSIS — N400 Enlarged prostate without lower urinary tract symptoms: Secondary | ICD-10-CM | POA: Diagnosis present

## 2024-04-15 DIAGNOSIS — I5042 Chronic combined systolic (congestive) and diastolic (congestive) heart failure: Secondary | ICD-10-CM | POA: Diagnosis present

## 2024-04-15 LAB — COMPREHENSIVE METABOLIC PANEL WITH GFR
ALT: 15 U/L (ref 0–44)
AST: 27 U/L (ref 15–41)
Albumin: 3.3 g/dL — ABNORMAL LOW (ref 3.5–5.0)
Alkaline Phosphatase: 76 U/L (ref 38–126)
Anion gap: 9 (ref 5–15)
BUN: 24 mg/dL — ABNORMAL HIGH (ref 8–23)
CO2: 23 mmol/L (ref 22–32)
Calcium: 8.7 mg/dL — ABNORMAL LOW (ref 8.9–10.3)
Chloride: 106 mmol/L (ref 98–111)
Creatinine, Ser: 1.63 mg/dL — ABNORMAL HIGH (ref 0.61–1.24)
GFR, Estimated: 43 mL/min — ABNORMAL LOW (ref >60)
Glucose, Bld: 116 mg/dL — ABNORMAL HIGH (ref 70–99)
Potassium: 4.8 mmol/L (ref 3.5–5.1)
Sodium: 138 mmol/L (ref 135–145)
Total Bilirubin: 0.9 mg/dL (ref 0.0–1.2)
Total Protein: 6.3 g/dL — ABNORMAL LOW (ref 6.5–8.1)

## 2024-04-15 LAB — RAPID URINE DRUG SCREEN, HOSP PERFORMED
Amphetamines: NOT DETECTED
Barbiturates: NOT DETECTED
Benzodiazepines: NOT DETECTED
Cocaine: NOT DETECTED
Opiates: NOT DETECTED
Tetrahydrocannabinol: NOT DETECTED

## 2024-04-15 LAB — CBC WITH DIFFERENTIAL/PLATELET
Abs Immature Granulocytes: 0.01 K/uL (ref 0.00–0.07)
Basophils Absolute: 0 K/uL (ref 0.0–0.1)
Basophils Relative: 1 %
Eosinophils Absolute: 0.1 K/uL (ref 0.0–0.5)
Eosinophils Relative: 2 %
HCT: 40.4 % (ref 39.0–52.0)
Hemoglobin: 12.2 g/dL — ABNORMAL LOW (ref 13.0–17.0)
Immature Granulocytes: 0 %
Lymphocytes Relative: 22 %
Lymphs Abs: 1.2 K/uL (ref 0.7–4.0)
MCH: 21.9 pg — ABNORMAL LOW (ref 26.0–34.0)
MCHC: 30.2 g/dL (ref 30.0–36.0)
MCV: 72.4 fL — ABNORMAL LOW (ref 80.0–100.0)
Monocytes Absolute: 0.4 K/uL (ref 0.1–1.0)
Monocytes Relative: 8 %
Neutro Abs: 3.6 K/uL (ref 1.7–7.7)
Neutrophils Relative %: 67 %
Platelets: 225 K/uL (ref 150–400)
RBC: 5.58 MIL/uL (ref 4.22–5.81)
RDW: 15.3 % (ref 11.5–15.5)
WBC: 5.3 K/uL (ref 4.0–10.5)
nRBC: 0 % (ref 0.0–0.2)

## 2024-04-15 LAB — URINALYSIS, ROUTINE W REFLEX MICROSCOPIC
Bilirubin Urine: NEGATIVE
Glucose, UA: NEGATIVE mg/dL
Hgb urine dipstick: NEGATIVE
Ketones, ur: NEGATIVE mg/dL
Leukocytes,Ua: NEGATIVE
Nitrite: NEGATIVE
Protein, ur: NEGATIVE mg/dL
Specific Gravity, Urine: 1.015 (ref 1.005–1.030)
pH: 5 (ref 5.0–8.0)

## 2024-04-15 LAB — ETHANOL: Alcohol, Ethyl (B): <15 mg/dL (ref <15)

## 2024-04-15 LAB — RESP PANEL BY RT-PCR (RSV, FLU A&B, COVID)  RVPGX2
Influenza A by PCR: NEGATIVE
Influenza B by PCR: NEGATIVE
Resp Syncytial Virus by PCR: NEGATIVE
SARS Coronavirus 2 by RT PCR: NEGATIVE

## 2024-04-15 LAB — CBG MONITORING, ED: Glucose-Capillary: 175 mg/dL — ABNORMAL HIGH (ref 70–99)

## 2024-04-15 NOTE — ED Provider Notes (Signed)
 [place] EMERGENCY DEPARTMENT AT [facility] Provider Note   CSN: [id number] Arrival date & time: 04/15/24  1453     Patient presents with: Altered Mental Status   Joseph [name] is a 78 y.o. male.   78 year old male with a history of prior stroke, dementia, A-fib on Eliquis , heart block status post Abbott pacemaker, and CHF who presents to the emergency department with increased weakness and altered mental status.  History limited due to patient's dementia.  Per EMS the patient started having weakness and altered mental status that started today.  Had a episode of decreased level of consciousness that his family witnessed and was brought into the emergency department for evaluation.  Later was able to talk to the patient's wife.  She states that he was eating something and felt tired and laid down.  When she tried to wake him up he was difficult to arouse and it appeared that he was unconscious.  No convulsions were noted.  Called 911 and after about 15 minutes started responding more.  Was recently admitted to the hospital for a syncopal episode with reassuring head imaging including an MRI it was thought to be a vasovagal event.  Did have some right sided weakness at that time that has persisted       Prior to Admission medications   Medication Sig Start Date End Date Taking? Authorizing Provider  alfuzosin  (UROXATRAL ) 10 MG 24 hr tablet Take 1 tablet (10 mg total) by mouth every morning. 04/03/24   McKenzie, Belvie CROME, MD  apixaban  (ELIQUIS ) 5 MG TABS tablet Take 1 tablet (5 mg total) by mouth 2 (two) times daily. 08/18/23   Waddell Danelle ORN, MD  carvedilol  (COREG ) 12.5 MG tablet TAKE 1 TABLET(12.5 MG) BY MOUTH TWICE DAILY 04/05/23   Burnard Debby LABOR, MD  cholecalciferol  (VITAMIN D3) 25 MCG (1000 UNIT) tablet Take 1 tablet (1,000 Units total) by mouth daily. Patient taking differently: Take 1,000 Units by mouth in the morning. 10/28/23   Del Orbe Polanco, Iliana, FNP   cycloSPORINE  (RESTASIS ) 0.05 % ophthalmic emulsion Place 1 drop into both eyes 2 (two) times daily. Patient taking differently: Place 1 drop into both eyes as needed. 10/28/23   Del Orbe Polanco, Iliana, FNP  donepezil  (ARICEPT ) 10 MG tablet Take 10 mg by mouth in the morning. 04/04/23   [provider]  fluticasone  (FLONASE ) 50 MCG/ACT nasal spray Place 2 sprays into both nostrils daily. Patient taking differently: Place 2 sprays into both nostrils as needed for allergies. 08/16/23   Soldatova, Liuba, MD  hydrochlorothiazide  (HYDRODIURIL ) 25 MG tablet TAKE 1 TABLET(25 MG) BY MOUTH DAILY Patient taking differently: Take 25 mg by mouth in the morning. 02/28/24   Del Orbe Polanco, Iliana, FNP  latanoprost  (XALATAN ) 0.005 % ophthalmic solution Place 1 drop into both eyes at bedtime. Patient taking differently: Place 1 drop into both eyes at bedtime as needed. 02/04/22   Pearlean Manus, MD  levocetirizine (XYZAL ) 5 MG tablet TAKE 1 TABLET(5 MG) BY MOUTH EVERY EVENING 11/02/23   Del Orbe Polanco, Lomira, FNP  melatonin 3 MG TABS tablet Take 1 tablet (3 mg total) by mouth at bedtime as needed. 04/13/24   Arlon Carliss ORN, DO  mirabegron  ER (MYRBETRIQ ) 50 MG TB24 tablet Take 1 tablet (50 mg total) by mouth every morning. 04/03/24   McKenzie, Belvie CROME, MD  Misc Natural Products (PROSTATE HEALTH PO) Take 1 capsule by mouth in the morning. Super Beta Prostate    [provider]  Multiple Vitamin (MULTIVITAMIN WITH MINERALS) TABS tablet Take 1 tablet by mouth in the morning.    [provider]  Omega-3 Fatty Acids (OMEGA 3 PO) Take 1 tablet by mouth in the morning.    [provider]  Polyethyl Glycol-Propyl Glycol (LUBRICANT EYE DROPS) 0.4-0.3 % SOLN Place 1 drop into both eyes 3 (three) times daily as needed (dry/irritated eyes.).    [provider]  rosuvastatin  (CRESTOR ) 20 MG tablet Take 1 tablet (20 mg total) by mouth daily. Patient taking differently: Take 20 mg  by mouth at bedtime. 02/02/24   Thukkani, Arun K, MD  sacubitril -valsartan  (ENTRESTO ) 24-26 MG TAKE 1 TABLET BY MOUTH TWICE DAILY 04/05/23   Burnard Debby LABOR, MD  spironolactone  (ALDACTONE ) 25 MG tablet Take 0.5 tablets (12.5 mg total) by mouth daily. Patient taking differently: Take 12.5 mg by mouth in the morning. 02/28/24   Lelon Hamilton T, PA-C  traZODone (DESYREL) 50 MG tablet Take 1 tablet (50 mg total) by mouth at bedtime as needed for sleep. 04/13/24   Arlon Carliss ORN, DO  UNABLE TO FIND Take 1 capsule by mouth in the morning. OTC Brain Health Medication    [provider]    Allergies: Patient has no known allergies.    Review of Systems  Updated Vital Signs BP (!) 155/83   Pulse 60   Temp 98 F (36.7 C)   Resp 15   Ht 5' 10.8 (1.798 m)   Wt 95.3 kg   SpO2 100%   BMI 29.45 kg/m   Physical Exam Vitals and nursing note reviewed.  Constitutional:      General: He is not in acute distress.    Appearance: He is well-developed.     Comments: Alert and oriented to self only  HENT:     Head: Normocephalic and atraumatic.     Right Ear: External ear normal.     Left Ear: External ear normal.     Nose: Nose normal.  Eyes:     Extraocular Movements: Extraocular movements intact.     Conjunctiva/sclera: Conjunctivae normal.     Pupils: Pupils are equal, round, and reactive to light.     Comments: Pupils 4 mm bilateral  Cardiovascular:     Rate and Rhythm: Normal rate. Rhythm irregular.     Heart sounds: Normal heart sounds.  Pulmonary:     Effort: Pulmonary effort is normal. No respiratory distress.     Breath sounds: Normal breath sounds.  Abdominal:     General: There is no distension.     Palpations: Abdomen is soft. There is no mass.     Tenderness: There is no abdominal tenderness. There is no guarding.  Musculoskeletal:     Cervical back: Normal range of motion and neck supple.     Right lower leg: No edema.     Left lower leg: No edema.  Skin:     General: Skin is warm and dry.  Neurological:     Mental Status: He is alert and oriented to person, place, and time. Mental status is at baseline.     Sensory: No sensory deficit.     Comments: EOM intact.  Pupils 4 mm bilaterally.  Intact sensation to light touch of all dermatomes of the face.  Slight nasolabial fold flattening on the right.  No drift in bilateral upper and bilateral lower extremities.  5/5 strength in left upper extremity, left lower extremity, and right lower extremity.  4+/5 strength in  right upper extremity -very slightly weaker than the left upper  Psychiatric:        Mood and Affect: Mood normal.        Behavior: Behavior normal.     (all labs ordered are listed, but only abnormal results are displayed) Labs Reviewed  COMPREHENSIVE METABOLIC PANEL WITH GFR - Abnormal; Notable for the following components:      Result Value   Glucose, Bld 116 (*)    BUN 24 (*)    Creatinine, Ser 1.63 (*)    Calcium  8.7 (*)    Total Protein 6.3 (*)    Albumin 3.3 (*)    GFR, Estimated 43 (*)    All other components within normal limits  CBC WITH DIFFERENTIAL/PLATELET - Abnormal; Notable for the following components:   Hemoglobin 12.2 (*)    MCV 72.4 (*)    MCH 21.9 (*)    All other components within normal limits  URINALYSIS, ROUTINE W REFLEX MICROSCOPIC - Abnormal; Notable for the following components:   APPearance HAZY (*)    All other components within normal limits  CBG MONITORING, ED - Abnormal; Notable for the following components:   Glucose-Capillary 175 (*)    All other components within normal limits  RESP PANEL BY RT-PCR (RSV, FLU A&B, COVID)  RVPGX2  RAPID URINE DRUG SCREEN, HOSP PERFORMED  ETHANOL    EKG: EKG Interpretation Date/Time:  Saturday April 15 2024 15:35:09 EDT Ventricular Rate:  60 PR Interval:  206 QRS Duration:  181 QT Interval:  469 QTC Calculation: 469 R Axis:   -34  Text Interpretation: Atrial-ventricular dual-paced rhythm No  further analysis attempted due to paced rhythm Confirmed by Yolande Charleston [phone]) on 04/15/2024 3:35:41 PM  Radiology: CT HEAD WO CONTRAST Result Date: 04/15/2024 CLINICAL DATA:  Altered mental status. EXAM: CT HEAD WITHOUT CONTRAST TECHNIQUE: Contiguous axial images were obtained from the base of the skull through the vertex without intravenous contrast. RADIATION DOSE REDUCTION: This exam was performed according to the departmental dose-optimization program which includes automated exposure control, adjustment of the mA and/or kV according to patient size and/or use of iterative reconstruction technique. COMPARISON:  April 06, 2024 FINDINGS: Brain: There is generalized cerebral atrophy with widening of the extra-axial spaces and ventricular dilatation. There are areas of decreased attenuation within the white matter tracts of the supratentorial brain, consistent with microvascular disease changes. Chronic bilateral thalamic and basal ganglia lacunar infarcts are noted. Vascular: Marked severity bilateral cavernous carotid artery calcification is noted. Skull: Normal. Negative for fracture or focal lesion. Sinuses/Orbits: No acute finding. Other: None. IMPRESSION: 1. Generalized cerebral atrophy with chronic bilateral thalamic and basal ganglia lacunar infarcts. 2. No acute intracranial abnormality. Electronically Signed   By: Suzen Dials M.D.   On: 04/15/2024 16:56   DG Chest Port 1 View Result Date: 04/15/2024 CLINICAL DATA:  Syncope. EXAM: PORTABLE CHEST 1 VIEW COMPARISON:  02/03/2022. FINDINGS: The heart is enlarged and the mediastinal contour is within normal limits. Lung volumes are low. No consolidation, effusion, or pneumothorax is seen. A multi lead cardiac device is present over the left chest. No acute osseous abnormality. IMPRESSION: No active disease. Electronically Signed   By: Leita Birmingham M.D.   On: 04/15/2024 16:20     Procedures   Medications Ordered in the ED - No data  to display  Clinical Course as of 04/15/24 2158  Sat Apr 15, 2024  1535 [name] contacted.  [RP]  1641 Creatinine(!): 1.63 At baseline [RP]  1953 Report is being faxed over.  Per rep no arrhythmias noted. [RP]  2030 Wife disclosed to the nurse that the patient does have right-sided weakness.  Says that he has had it since he was admitted to the hospital on 9/25.  Says that the weakness was there prior to 1 PM today but that he did pass out at 1 PM today.  Was a code stroke activation at that hospitalization.  Had an MRI did not show evidence of stroke.  Also had an EEG that was unremarkable. [RP]  2047 Dr Sallyann from neurology to evaluate the patient. [RP]  2144 Signed out to Dr Randol.  [RP]    Clinical Course User Index [RP] Yolande Lamar BROCKS, MD                                 Medical Decision Making Amount and/or Complexity of Data Reviewed Labs: ordered. Decision-making details documented in ED Course. Radiology: ordered.   Joseph [name] is a 78 year old male with a history of prior stroke, dementia, A-fib on Eliquis , heart block status post Abbott pacemaker, and CHF who presents to the emergency department with increased weakness and altered mental status.   Initial Ddx:  Syncope, seizure, stroke, ICH  MDM/Course:  Patient presents emergency department with loss of consciousness episode..  To happen after he was sleeping.  Did have a similar event on 9/25.  Was noted to have some right-sided weakness then.  Was a code stroke done.  Wife reports that the right sided weakness has persisted since.  Has already had an EEG and MRI.  On exam does have very slight weakness of the right side of his face and right upper extremity though it is very very mild.  Lab work remarkable for CKD.  Urinalysis unremarkable.  COVID and flu negative.  No signs of pneumonia on his chest x-ray.  Did have a head CT that did not show acute findings.  His pacemaker was interrogated and did not show  any cardiac events that caused this.  Upon re-evaluation no new symptoms.  Did discuss with neurology to see if he needs a better quality MRI and/or repeat EEG given his symptoms. Signed out to the oncoming ED physician awaiting their final recommendations  This patient presents to the ED for concern of complaints listed in HPI, this involves an extensive number of treatment options, and is a complaint that carries with it a high risk of complications and morbidity. Disposition including potential need for admission considered.   Dispo: Pending remainder of workup  Additional history obtained from spouse Records reviewed DC Summary The following labs were independently interpreted: Chemistry and show CKD I independently reviewed the following imaging with scope of interpretation limited to determining acute life threatening conditions related to emergency care: CT Head and agree with the radiologist interpretation with the following exceptions: none I personally reviewed and interpreted cardiac monitoring: Paced rhythm I personally reviewed and interpreted the pt's EKG: see above for interpretation  I have reviewed the patients home medications and made adjustments as needed Consults: Neurology Social Determinants of health:  Geriatric  Portions of this note were generated with Scientist, clinical (histocompatibility and immunogenetics). Dictation errors may occur despite best attempts at proofreading.      Final diagnoses:  Loss of consciousness (HCC)  Right sided weakness    ED Discharge Orders     None  Yolande Lamar BROCKS, MD 04/15/24 2158

## 2024-04-15 NOTE — ED Notes (Signed)
 Assuming pt care, pt bib ems coming alf for RT side weakness, RT side facial droop, symptoms onset around 1pm today. Family reports med hx. Strokes, Htn, afib, pacemaker to left chest. Currently reading pacemaker with transmitter device. Pt aaox3 disoriented to time, laying in bed, pt denies pain, family at bedside, call bell within reach

## 2024-04-15 NOTE — ED Triage Notes (Signed)
 BIB EMS from [facility] r/t Increased weakness and AMS that started today. Family endorses 30sec episode of decreased LOC. A&Ox3. Denies any pain.

## 2024-04-15 NOTE — Consult Note (Signed)
 Wife wasn't there to witness first instance but: Heard them calling his name from phlebotomy Came in and had glazed look Unsure if R droop or weakness By time she cam eto [facility] ED no droop but he was still foggy  Doesn't think he ever got back to normal since then  Today this morning r facial droop eyes looking straigth but glazed couldn't track her After some minutes was able to talk but seemed sleepy Very drowsy since then  Baseline - doesn't walk anymore, trying with PT, legs shake - realized around 2020 that he was having mmeory issues, n ot paying bills on time. Now doesn't recognize some ppl he should. Doesn't recognize any new ppl or form new membories.  - prior to first presentation was able to feed and dress himself just fine but now not really. Can feed self but very slow and inaccurate.   Never shown extent on mri Have children R droop durign exam but was able to respond and was pretty attentive At times seemed to stare off but then if give enough time would respond with answer Says he has lots of sons when that's not true Couldn't remember children's names Strong Intermittent R droop but very mild  No other FND Defererd gait  FASICS? Borderline thenar atrophy?  Ue's reflexes are much more high than LE.SABRA FTD with ALS? But memory component very AD. Benign fasics? ---------------  Passed out with blood draw so admitted as stroke, DWI OK but motion degraded, EEG OK Similar thin g happened today after waking up from taking nap  R face arm and leg still weak - Very mild weakness he says  So just 2 but the same  Joseph [name] is a 78 y.o. male with a history of dementia who presents with sudden mental status change in the setting of venipuncture.  He does have a pacemaker, which makes vasovagal syncope less likely.  I do think a vagal episode is possible, as you can have vasodilation, and in the setting of not eating prior to his labs, it is possible he was somewhat  volume depressed as well.  Given the report of focal symptoms, I do think an MRI would be reasonable, as is an EEG, and these are pending    Joseph [name] is a 78/M with history of prior CVA, dementia, paroxysmal A-fib on Eliquis , diastolic CHF was getting routine lab drawn today, his wife was in the waiting room when she was called in, patient had a syncopal event with loss of consciousness, there after some facial droop and abnormal speech was noted as well which gradually resolved as he regained consciousness.  Subsequently brought to the ED as a code stroke, ED Course: Hypertensive, labs notable for potassium of 5.7, creatinine 1.9   Hospital Course:  Syncope and collapse -Transient facial droop -Appears to be a syncopal episode, possibly vasovagal  - Borderline orthostatics positive, clonidine  discontinued -Neurology consult appreciated, MRI brain and EEG unremarkable for acute findings - PT OT eval completed, SNF recommended, wife wants rehab, TOC consult - Discharge planning   Paroxysmal atrial fibrillation  - Poor compliance with Eliquis  at baseline, only takes a.m. 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 

## 2024-04-15 NOTE — ED Provider Notes (Signed)
 Patient was initially seen by Dr. Jakie.  Please see his note.  Patient was pending neurology consultation.  Patient was seen by Dr. Sallyann.  She discussed options with the patient and they would prefer admission for further evaluation including LTM.  I will consult the medical service for admission  Case discussed with Dr Charlton Randol Simmonds, MD 04/15/24 [phone]

## 2024-04-16 ENCOUNTER — Observation Stay (HOSPITAL_COMMUNITY)

## 2024-04-16 ENCOUNTER — Other Ambulatory Visit: Payer: Self-pay

## 2024-04-16 ENCOUNTER — Encounter (HOSPITAL_COMMUNITY): Payer: Self-pay | Admitting: Family Medicine

## 2024-04-16 DIAGNOSIS — R569 Unspecified convulsions: Secondary | ICD-10-CM

## 2024-04-16 DIAGNOSIS — R404 Transient alteration of awareness: Secondary | ICD-10-CM | POA: Diagnosis present

## 2024-04-16 DIAGNOSIS — G8191 Hemiplegia, unspecified affecting right dominant side: Secondary | ICD-10-CM | POA: Diagnosis present

## 2024-04-16 DIAGNOSIS — R4182 Altered mental status, unspecified: Secondary | ICD-10-CM | POA: Diagnosis present

## 2024-04-16 DIAGNOSIS — I5042 Chronic combined systolic (congestive) and diastolic (congestive) heart failure: Secondary | ICD-10-CM | POA: Diagnosis present

## 2024-04-16 DIAGNOSIS — R4782 Fluency disorder in conditions classified elsewhere: Secondary | ICD-10-CM | POA: Diagnosis present

## 2024-04-16 DIAGNOSIS — Z8673 Personal history of transient ischemic attack (TIA), and cerebral infarction without residual deficits: Secondary | ICD-10-CM | POA: Diagnosis not present

## 2024-04-16 DIAGNOSIS — N400 Enlarged prostate without lower urinary tract symptoms: Secondary | ICD-10-CM | POA: Diagnosis present

## 2024-04-16 DIAGNOSIS — I130 Hypertensive heart and chronic kidney disease with heart failure and stage 1 through stage 4 chronic kidney disease, or unspecified chronic kidney disease: Secondary | ICD-10-CM | POA: Diagnosis present

## 2024-04-16 DIAGNOSIS — R4020 Unspecified coma: Secondary | ICD-10-CM | POA: Diagnosis present

## 2024-04-16 DIAGNOSIS — Z818 Family history of other mental and behavioral disorders: Secondary | ICD-10-CM | POA: Diagnosis not present

## 2024-04-16 DIAGNOSIS — Z1152 Encounter for screening for COVID-19: Secondary | ICD-10-CM | POA: Diagnosis not present

## 2024-04-16 DIAGNOSIS — G4700 Insomnia, unspecified: Secondary | ICD-10-CM | POA: Diagnosis present

## 2024-04-16 DIAGNOSIS — N1831 Chronic kidney disease, stage 3a: Secondary | ICD-10-CM | POA: Diagnosis present

## 2024-04-16 DIAGNOSIS — R29810 Facial weakness: Secondary | ICD-10-CM | POA: Diagnosis present

## 2024-04-16 DIAGNOSIS — Z823 Family history of stroke: Secondary | ICD-10-CM | POA: Diagnosis not present

## 2024-04-16 DIAGNOSIS — F0390 Unspecified dementia without behavioral disturbance: Secondary | ICD-10-CM | POA: Diagnosis present

## 2024-04-16 DIAGNOSIS — I480 Paroxysmal atrial fibrillation: Secondary | ICD-10-CM | POA: Diagnosis present

## 2024-04-16 DIAGNOSIS — Z8249 Family history of ischemic heart disease and other diseases of the circulatory system: Secondary | ICD-10-CM | POA: Diagnosis not present

## 2024-04-16 DIAGNOSIS — Z7901 Long term (current) use of anticoagulants: Secondary | ICD-10-CM | POA: Diagnosis not present

## 2024-04-16 DIAGNOSIS — G9340 Encephalopathy, unspecified: Secondary | ICD-10-CM | POA: Diagnosis present

## 2024-04-16 DIAGNOSIS — N3281 Overactive bladder: Secondary | ICD-10-CM | POA: Diagnosis present

## 2024-04-16 DIAGNOSIS — Z9852 Vasectomy status: Secondary | ICD-10-CM | POA: Diagnosis not present

## 2024-04-16 DIAGNOSIS — Z950 Presence of cardiac pacemaker: Secondary | ICD-10-CM | POA: Diagnosis not present

## 2024-04-16 DIAGNOSIS — E785 Hyperlipidemia, unspecified: Secondary | ICD-10-CM | POA: Diagnosis present

## 2024-04-16 DIAGNOSIS — Z87891 Personal history of nicotine dependence: Secondary | ICD-10-CM | POA: Diagnosis not present

## 2024-04-16 DIAGNOSIS — Z79899 Other long term (current) drug therapy: Secondary | ICD-10-CM | POA: Diagnosis not present

## 2024-04-16 LAB — CBC
HCT: 36.3 % — ABNORMAL LOW (ref 39.0–52.0)
Hemoglobin: 11.1 g/dL — ABNORMAL LOW (ref 13.0–17.0)
MCH: 21.9 pg — ABNORMAL LOW (ref 26.0–34.0)
MCHC: 30.6 g/dL (ref 30.0–36.0)
MCV: 71.5 fL — ABNORMAL LOW (ref 80.0–100.0)
Platelets: 207 K/uL (ref 150–400)
RBC: 5.08 MIL/uL (ref 4.22–5.81)
RDW: 15.4 % (ref 11.5–15.5)
WBC: 6.4 K/uL (ref 4.0–10.5)
nRBC: 0 % (ref 0.0–0.2)

## 2024-04-16 LAB — BASIC METABOLIC PANEL WITH GFR
Anion gap: 10 (ref 5–15)
BUN: 21 mg/dL (ref 8–23)
CO2: 25 mmol/L (ref 22–32)
Calcium: 8.7 mg/dL — ABNORMAL LOW (ref 8.9–10.3)
Chloride: 103 mmol/L (ref 98–111)
Creatinine, Ser: 1.54 mg/dL — ABNORMAL HIGH (ref 0.61–1.24)
GFR, Estimated: 46 mL/min — ABNORMAL LOW (ref >60)
Glucose, Bld: 115 mg/dL — ABNORMAL HIGH (ref 70–99)
Potassium: 4 mmol/L (ref 3.5–5.1)
Sodium: 138 mmol/L (ref 135–145)

## 2024-04-16 MED ORDER — TRAZODONE HCL 50 MG PO TABS
50.0000 mg | ORAL_TABLET | Freq: Every evening | ORAL | Status: DC | PRN
Start: 2024-04-16 — End: 2024-04-20

## 2024-04-16 MED ORDER — MIRABEGRON ER 50 MG PO TB24
50.0000 mg | ORAL_TABLET | Freq: Every morning | ORAL | Status: DC
  Administered 2024-04-16 – 2024-04-19 (×4): 50 mg via ORAL
  Filled 2024-04-16 (×4): qty 1

## 2024-04-16 MED ORDER — CARVEDILOL 12.5 MG PO TABS
12.5000 mg | ORAL_TABLET | Freq: Two times a day (BID) | ORAL | Status: DC
  Administered 2024-04-16 – 2024-04-19 (×7): 12.5 mg via ORAL
  Filled 2024-04-16 (×7): qty 1

## 2024-04-16 MED ORDER — ACETAMINOPHEN 650 MG RE SUPP: 650.0000 mg | Freq: Four times a day (QID) | RECTAL | Status: DC | PRN

## 2024-04-16 MED ORDER — ACETAMINOPHEN 325 MG PO TABS: 650.0000 mg | ORAL_TABLET | Freq: Four times a day (QID) | ORAL | Status: DC | PRN

## 2024-04-16 MED ORDER — ROSUVASTATIN CALCIUM 20 MG PO TABS
20.0000 mg | ORAL_TABLET | Freq: Every day | ORAL | Status: DC
  Administered 2024-04-16 – 2024-04-18 (×3): 20 mg via ORAL
  Filled 2024-04-16 (×3): qty 1

## 2024-04-16 MED ORDER — PROCHLORPERAZINE EDISYLATE 10 MG/2ML IJ SOLN: 5.0000 mg | Freq: Four times a day (QID) | INTRAMUSCULAR | Status: DC | PRN

## 2024-04-16 MED ORDER — HALOPERIDOL LACTATE 5 MG/ML IJ SOLN
1.0000 mg | Freq: Four times a day (QID) | INTRAMUSCULAR | Status: DC | PRN
  Administered 2024-04-16 – 2024-04-19 (×2): 1 mg via INTRAVENOUS
  Filled 2024-04-16 (×2): qty 1

## 2024-04-16 MED ORDER — ALFUZOSIN HCL ER 10 MG PO TB24
10.0000 mg | ORAL_TABLET | Freq: Every morning | ORAL | Status: DC
  Administered 2024-04-16 – 2024-04-19 (×4): 10 mg via ORAL
  Filled 2024-04-16 (×4): qty 1

## 2024-04-16 MED ORDER — DONEPEZIL HCL 10 MG PO TABS
10.0000 mg | ORAL_TABLET | Freq: Every day | ORAL | Status: DC
  Administered 2024-04-16 – 2024-04-19 (×4): 10 mg via ORAL
  Filled 2024-04-16 (×4): qty 1

## 2024-04-16 MED ORDER — SODIUM CHLORIDE 0.9% FLUSH
3.0000 mL | Freq: Two times a day (BID) | INTRAVENOUS | Status: DC
  Administered 2024-04-16 – 2024-04-19 (×8): 3 mL via INTRAVENOUS

## 2024-04-16 MED ORDER — APIXABAN 5 MG PO TABS
5.0000 mg | ORAL_TABLET | Freq: Two times a day (BID) | ORAL | Status: DC
  Administered 2024-04-16 – 2024-04-19 (×8): 5 mg via ORAL
  Filled 2024-04-16 (×8): qty 1

## 2024-04-16 MED ORDER — SENNA 8.6 MG PO TABS
1.0000 | ORAL_TABLET | Freq: Every day | ORAL | Status: DC | PRN
  Administered 2024-04-19: 8.6 mg via ORAL
  Filled 2024-04-16: qty 1

## 2024-04-16 NOTE — H&P (Signed)
 History and Physical    Joseph [name] FMW:[id number] DOB: August 24, 1945 DOA: 04/15/2024  PCP: Terry Wilhelmena Lloyd Hilario, FNP   Patient coming from: SNF   Chief Complaint: Unresponsive episode, weakness   HPI: Joseph [name] is a 78 y.o. male with medical history significant for hypertension, hyperlipidemia, atrial fibrillation on Eliquis , CVA, dementia, heart block with pacemaker, chronic HFpEF, and recent admission for suspected syncopal episode who now returns after an episode of unresponsiveness.  Patient had been admitted to the hospital on 04/06/2024 after a transient loss of consciousness with right facial droop and AKI superimposed on CKD 3A.  He was seen by neurology during that hospitalization, had no acute findings on MRI or routine EEG, and had borderline positive orthostatic vitals.  Clonidine  and Cardura  were discontinued and he was discharged to an SNF on 04/13/2024.  Per report of the patient's wife at the bedside, the patient has continued to be much more fatigued and generally weak than he was prior to the recent admission.  He then had an episode today after eating lunch where he appeared to be sleeping but his wife was unable to wake him.  She was concerned that he appeared to be gagging or trying to throw up at 1 point during the episode.  EMS was called and the patient eventually began to respond after 15 to 20 minutes but remained somnolent and confused.  ED Course: Upon arrival to the ED, patient is found to be afebrile and saturating well on room air with normal HR and stable BP.  Labs are most notable for creatinine 1.63, normal WBC, unremarkable urine, and negative UDS.  There were no acute findings on head CT or on chest x-ray.  Pacemaker was interrogated and there were no events according to the pacemaker representative.  Neurology was consulted by the ED physician and hospitalists were asked to admit.  Review of Systems:  All other systems reviewed and apart from  HPI, are negative.  Past Medical History:  Diagnosis Date   1st degree AV block    Atrial fibrillation (HCC)    Diastolic dysfunction 4/13   grade 1   Hypertension    New onset atrial fibrillation (HCC) 06/2015   RBBB    Sinus bradycardia    Stroke (HCC)    no defecits, pt denies having a stroke, says Dr Burnard diagnosed this    Past Surgical History:  Procedure Laterality Date   BIV PACEMAKER INSERTION CRT-P N/A 01/26/2022   Procedure: BIV PACEMAKER INSERTION CRT-P;  Surgeon: Waddell Danelle ORN, MD;  Location: [facility] INVASIVE CV LAB;  Service: Cardiovascular;  Laterality: N/A;   COLONOSCOPY     COLONOSCOPY  12/30/2011   Procedure: COLONOSCOPY;  Surgeon: Lamar CHRISTELLA Hollingshead, MD;  Location: [facility] ENDO SUITE;  Service: Endoscopy;  Laterality: N/A;  1:45PM   COLONOSCOPY N/A 04/19/2019   Procedure: COLONOSCOPY;  Surgeon: Hollingshead Lamar CHRISTELLA, MD;  Location: [facility];  Service: Endoscopy;  Laterality: N/A;  2:00pm   EXCISION MASS LOWER EXTREMETIES Left 05/12/2018   Procedure: Left foot plantar fibroma excision;  Surgeon: Kit Rush, MD;  Location: [facility];  Service: Orthopedics;  Laterality: Left;   FOOT SURGERY Left    LEFT HEART CATH AND CORONARY ANGIOGRAPHY N/A 01/26/2022   Procedure: LEFT HEART CATH AND CORONARY ANGIOGRAPHY;  Surgeon: Burnard Debby LABOR, MD;  Location: [facility] INVASIVE CV LAB;  Service: Cardiovascular;  Laterality: N/A;   SPERMATOCELECTOMY Right 11/13/2019   Procedure: EXCISION OF SPERMATIC CORD CYST;  Surgeon: Sherrilee Belvie CROME, MD;  Location: [facility] OR[facility];  Service: Urology;  Laterality: Right;   VASECTOMY  11/13/2019   Procedure: VASECTOMY;  Surgeon: Sherrilee Belvie CROME, MD;  Location: [facility] ORS;  Service: Urology;;    Social History:   reports that he quit smoking about 29 years ago. His smoking use included cigarettes. He has never used smokeless tobacco. He reports that he does not currently use alcohol . He reports that he does not use drugs.  No Known Allergies  Family  History  Problem Relation Age of Onset   Cancer Brother    CVA Mother    Hypertension Father    Colon cancer Neg Hx      Prior to Admission medications   Medication Sig Start Date End Date Taking? Authorizing Provider  alfuzosin  (UROXATRAL ) 10 MG 24 hr tablet Take 1 tablet (10 mg total) by mouth every morning. 04/03/24   McKenzie, Belvie CROME, MD  apixaban  (ELIQUIS ) 5 MG TABS tablet Take 1 tablet (5 mg total) by mouth 2 (two) times daily. 08/18/23   Waddell Danelle ORN, MD  carvedilol  (COREG ) 12.5 MG tablet TAKE 1 TABLET(12.5 MG) BY MOUTH TWICE DAILY 04/05/23   Burnard Debby LABOR, MD  cholecalciferol  (VITAMIN D3) 25 MCG (1000 UNIT) tablet Take 1 tablet (1,000 Units total) by mouth daily. Patient taking differently: Take 1,000 Units by mouth in the morning. 10/28/23   Del Orbe Polanco, Iliana, FNP  cycloSPORINE  (RESTASIS ) 0.05 % ophthalmic emulsion Place 1 drop into both eyes 2 (two) times daily. Patient taking differently: Place 1 drop into both eyes as needed. 10/28/23   Del Wilhelmena Lloyd Sola, FNP  donepezil  (ARICEPT ) 10 MG tablet Take 10 mg by mouth in the morning. 04/04/23   [provider]  fluticasone  (FLONASE ) 50 MCG/ACT nasal spray Place 2 sprays into both nostrils daily. Patient taking differently: Place 2 sprays into both nostrils as needed for allergies. 08/16/23   Soldatova, [place], MD  hydrochlorothiazide  (HYDRODIURIL ) 25 MG tablet TAKE 1 TABLET(25 MG) BY MOUTH DAILY Patient taking differently: Take 25 mg by mouth in the morning. 02/28/24   Del Orbe Polanco, Iliana, FNP  latanoprost  (XALATAN ) 0.005 % ophthalmic solution Place 1 drop into both eyes at bedtime. Patient taking differently: Place 1 drop into both eyes at bedtime as needed. 02/04/22   Pearlean Manus, MD  levocetirizine (XYZAL ) 5 MG tablet TAKE 1 TABLET(5 MG) BY MOUTH EVERY EVENING 11/02/23   Del Orbe Polanco, [place], FNP  melatonin 3 MG TABS tablet Take 1 tablet (3 mg total) by mouth at bedtime as needed. 04/13/24   Arlon Carliss ORN, DO  mirabegron  ER (MYRBETRIQ ) 50 MG TB24 tablet Take 1 tablet (50 mg total) by mouth every morning. 04/03/24   McKenzie, Belvie CROME, MD  Misc Natural Products (PROSTATE HEALTH PO) Take 1 capsule by mouth in the morning. Super Beta Prostate    [provider]  Multiple Vitamin (MULTIVITAMIN WITH MINERALS) TABS tablet Take 1 tablet by mouth in the morning.    [provider]  Omega-3 Fatty Acids (OMEGA 3 PO) Take 1 tablet by mouth in the morning.    [provider]  Polyethyl Glycol-Propyl Glycol (LUBRICANT EYE DROPS) 0.4-0.3 % SOLN Place 1 drop into both eyes 3 (three) times daily as needed (dry/irritated eyes.).    [provider]  rosuvastatin  (CRESTOR ) 20 MG tablet Take 1 tablet (20 mg total) by mouth daily. Patient taking differently: Take 20 mg by mouth at bedtime. 02/02/24   Thukkani, Arun  K, MD  sacubitril -valsartan  (ENTRESTO ) 24-26 MG TAKE 1 TABLET BY MOUTH TWICE DAILY 04/05/23   Burnard Debby LABOR, MD  spironolactone  (ALDACTONE ) 25 MG tablet Take 0.5 tablets (12.5 mg total) by mouth daily. Patient taking differently: Take 12.5 mg by mouth in the morning. 02/28/24   Lelon Hamilton T, PA-C  traZODone (DESYREL) 50 MG tablet Take 1 tablet (50 mg total) by mouth at bedtime as needed for sleep. 04/13/24   Arlon Carliss ORN, DO  UNABLE TO FIND Take 1 capsule by mouth in the morning. OTC Brain Health Medication    [provider]    Physical Exam: Vitals:   04/15/24 1915 04/15/24 1952 04/15/24 2100 04/15/24 2349  BP: (!) 149/86  (!) 155/83   Pulse: 60  60   Resp: 20  15   Temp:  98 F (36.7 C)  98.1 F (36.7 C)  TempSrc:    Oral  SpO2: 100%  100%   Weight:      Height:        Constitutional: NAD, no pallor or diaphoresis   Eyes: PERTLA, lids and conjunctivae normal ENMT: Mucous membranes are moist. Posterior pharynx clear of any exudate or lesions.   Neck: supple, no masses  Respiratory: no wheezing, no crackles. No accessory muscle use.   Cardiovascular: S1 & S2 heard, regular rate and rhythm. No JVD.  Abdomen: No tenderness, soft. Bowel sounds active.  Musculoskeletal: no clubbing / cyanosis. No joint deformity upper and lower extremities.   Skin: no significant rashes, lesions, ulcers. Warm, dry, well-perfused. Neurologic: CN 2-12 grossly intact. Sensation to light touch intact. Strength 5/5 in all 4 limbs. Alert. Slow to respond to questions but ultimately answers appropriately and is oriented to person, place, and situation.  Psychiatric: Calm. Cooperative.    Labs and Imaging on Admission: I have personally reviewed following labs and imaging studies  CBC: Recent Labs  Lab 04/15/24 1530  WBC 5.3  NEUTROABS 3.6  HGB 12.2*  HCT 40.4  MCV 72.4*  PLT 225   Basic Metabolic Panel: Recent Labs  Lab 04/09/24 0436 04/10/24 0208 04/11/24 0433 04/12/24 0213 04/15/24 1530  NA 139 135 135 138 138  K 4.3 4.0 4.0 4.5 4.8  CL 106 106 105 106 106  CO2 22 22 23 22 23   GLUCOSE 101* 110* 98 106* 116*  BUN 25* 24* 18 27* 24*  CREATININE 1.54* 1.59* 1.19 1.65* 1.63*  CALCIUM  8.7* 8.6* 8.7* 8.6* 8.7*   GFR: Estimated Creatinine Clearance: 45.3 mL/min (A) (by C-G formula based on SCr of 1.63 mg/dL (H)). Liver Function Tests: Recent Labs  Lab 04/15/24 1530  AST 27  ALT 15  ALKPHOS 76  BILITOT 0.9  PROT 6.3*  ALBUMIN 3.3*   No results for input(s): LIPASE, AMYLASE in the last 168 hours. No results for input(s): AMMONIA in the last 168 hours. Coagulation Profile: No results for input(s): INR, PROTIME in the last 168 hours. Cardiac Enzymes: No results for input(s): CKTOTAL, CKMB, CKMBINDEX, TROPONINI in the last 168 hours. BNP (last 3 results) No results for input(s): PROBNP in the last 8760 hours. HbA1C: No results for input(s): HGBA1C in the last 72 hours. CBG: Recent Labs  Lab 04/15/24 1459  GLUCAP 175*   Lipid Profile: No results for input(s): CHOL, HDL, LDLCALC, TRIG,  CHOLHDL, LDLDIRECT in the last 72 hours. Thyroid  Function Tests: No results for input(s): TSH, T4TOTAL, FREET4, T3FREE, THYROIDAB in the last 72 hours. Anemia Panel: No results for input(s): VITAMINB12, FOLATE, FERRITIN,  TIBC, IRON, RETICCTPCT in the last 72 hours. Urine analysis:    Component Value Date/Time   COLORURINE YELLOW 04/15/2024 1712   APPEARANCEUR HAZY (A) 04/15/2024 1712   APPEARANCEUR Clear 04/03/2024 1053   LABSPEC 1.015 04/15/2024 1712   PHURINE 5.0 04/15/2024 1712   GLUCOSEU NEGATIVE 04/15/2024 1712   HGBUR NEGATIVE 04/15/2024 1712   BILIRUBINUR NEGATIVE 04/15/2024 1712   BILIRUBINUR Negative 04/03/2024 1053   KETONESUR NEGATIVE 04/15/2024 1712   PROTEINUR NEGATIVE 04/15/2024 1712   NITRITE NEGATIVE 04/15/2024 1712   LEUKOCYTESUR NEGATIVE 04/15/2024 1712   Sepsis Labs: @LABRCNTIP (procalcitonin:4,lacticidven:4) ) Recent Results (from the past 240 hours)  Resp panel by RT-PCR (RSV, Flu A&B, Covid) Anterior Nasal Swab     Status: None   Collection Time: 04/15/24  3:34 PM   Specimen: Anterior Nasal Swab  Result Value Ref Range Status   SARS Coronavirus 2 by RT PCR NEGATIVE NEGATIVE Final   Influenza A by PCR NEGATIVE NEGATIVE Final   Influenza B by PCR NEGATIVE NEGATIVE Final    Comment: (NOTE) The Xpert Xpress SARS-CoV-2/FLU/RSV plus assay is intended as an aid in the diagnosis of influenza from Nasopharyngeal swab specimens and should not be used as a sole basis for treatment. Nasal washings and aspirates are unacceptable for Xpert Xpress SARS-CoV-2/FLU/RSV testing.  Fact Sheet for Patients: BloggerCourse.com  Fact Sheet for Healthcare Providers: SeriousBroker.it  This test is not yet approved or cleared by the [place]  FDA and has been authorized for detection and/or diagnosis of SARS-CoV-2 by FDA under an Emergency Use Authorization (EUA). This EUA will remain in effect  (meaning this test can be used) for the duration of the COVID-19 declaration under Section 564(b)(1) of the Act, 21 U.S.C. section 360bbb-3(b)(1), unless the authorization is terminated or revoked.     Resp Syncytial Virus by PCR NEGATIVE NEGATIVE Final    Comment: (NOTE) Fact Sheet for Patients: BloggerCourse.com  Fact Sheet for Healthcare Providers: SeriousBroker.it  This test is not yet approved or cleared by the [place]  FDA and has been authorized for detection and/or diagnosis of SARS-CoV-2 by FDA under an Emergency Use Authorization (EUA). This EUA will remain in effect (meaning this test can be used) for the duration of the COVID-19 declaration under Section 564(b)(1) of the Act, 21 U.S.C. section 360bbb-3(b)(1), unless the authorization is terminated or revoked.  Performed at [facility], [address], [place], [place] [address]      Radiological Exams on Admission: CT HEAD WO CONTRAST Result Date: 04/15/2024 CLINICAL DATA:  Altered mental status. EXAM: CT HEAD WITHOUT CONTRAST TECHNIQUE: Contiguous axial images were obtained from the base of the skull through the vertex without intravenous contrast. RADIATION DOSE REDUCTION: This exam was performed according to the departmental dose-optimization program which includes automated exposure control, adjustment of the mA and/or kV according to patient size and/or use of iterative reconstruction technique. COMPARISON:  April 06, 2024 FINDINGS: Brain: There is generalized cerebral atrophy with widening of the extra-axial spaces and ventricular dilatation. There are areas of decreased attenuation within the white matter tracts of the supratentorial brain, consistent with microvascular disease changes. Chronic bilateral thalamic and basal ganglia lacunar infarcts are noted. Vascular: Marked severity bilateral cavernous carotid artery calcification is noted. Skull:  Normal. Negative for fracture or focal lesion. Sinuses/Orbits: No acute finding. Other: None. IMPRESSION: 1. Generalized cerebral atrophy with chronic bilateral thalamic and basal ganglia lacunar infarcts. 2. No acute intracranial abnormality. Electronically Signed   By: Suzen Dwane HERO.D.  On: 04/15/2024 16:56   DG Chest Port 1 View Result Date: 04/15/2024 CLINICAL DATA:  Syncope. EXAM: PORTABLE CHEST 1 VIEW COMPARISON:  02/03/2022. FINDINGS: The heart is enlarged and the mediastinal contour is within normal limits. Lung volumes are low. No consolidation, effusion, or pneumothorax is seen. A multi lead cardiac device is present over the left chest. No acute osseous abnormality. IMPRESSION: No active disease. Electronically Signed   By: Leita Birmingham M.D.   On: 04/15/2024 16:20    EKG: Independently reviewed. Paced rhythm.   Assessment/Plan   1. Episode of unresponsiveness and right facial droop   - He had a similar episode recently that was felt to possibly be vasovagal; neurology notes that the right facial droop was documented by outpatient neurologist a year ago  - There were no acute findings on head CT or CTA head & neck  - Neurology planning for overnight EEG, will follow-up on any additional neurology recommendations   2. PAF  - Continue Eliquis  and Coreg     3. CKD 3A  - SCr is 1.63 on admission; baseline may be closer to 1.3  - Hold Aldactone , Entresto , and hydrochlorothiazide  for now, renally-dose medications, repeat chem panel in am   4. Chronic HFpEF  - Appears compensated  - Holding diuretics and Entresto  initially in light of increased creatinine    5. Dementia; insomnia  - Continue Aricept  and trazodone, use delirium precautions     DVT prophylaxis: Eliquis   Code Status: Full  Level of Care: Level of care: Telemetry Medical Family Communication: wife at bedside  Disposition Plan:  Patient is from: SNF  Anticipated d/c is to: SNF  Anticipated d/c date is: 10/5  or 04/17/24  Patient currently: Pending neurology consultation, EEG, clinical course  Consults called: Neurology  Admission status: Observation     Evalene GORMAN Sprinkles, MD [facility]  04/16/2024, 12:34 AM

## 2024-04-16 NOTE — Progress Notes (Signed)
   04/16/24 1540  Vitals  Temp 98.6 F (37 C)  Temp Source Oral  BP (!) 147/82  MAP (mmHg) 102  BP Location Left Arm  BP Method Automatic  Patient Position (if appropriate) Lying  Pulse Rate 64  Pulse Rate Source Monitor  ECG Heart Rate 65  Resp 13  MEWS COLOR  MEWS Score Color Green  Oxygen Therapy  SpO2 98 %  O2 Device Room Air  MEWS Score  MEWS Temp 0  MEWS Systolic 0  MEWS Pulse 0  MEWS RR 1  MEWS LOC 0  MEWS Score 1   Pt a&o x2, VSS, wife at the bedside, admitted to tele, EEG connected and monitoring started, pt stated no pain, oriented to unit.

## 2024-04-16 NOTE — Procedures (Addendum)
 Patient Name: RASHAAD HALLSTROM  MRN: [id number]  Epilepsy Attending: Arlin MALVA Krebs  Referring Physician/Provider: Sallyann Normie CHRISTELLA, MD  Duration: 04/16/2024 0350 to 04/17/2024 0350   Patient history: 78 y.o. male with PMH of dementia, afib (on Eliquis ), tachybradycardia, heart block (s/p biventricular pacemaker), HTN, HF, who presents after transient episode of right facial droop and unresponsiveness. EEG to evaluate for seizure   Level of alertness: Awake, asleep  AEDs during EEG study: None  Technical aspects: This EEG study was done with scalp electrodes positioned according to the 10-20 International system of electrode placement. Electrical activity was reviewed with band pass filter of 1-70Hz , sensitivity of 7 uV/mm, display speed of 74mm/sec with a 60Hz  notched filter applied as appropriate. EEG data were recorded continuously and digitally stored.  Video monitoring was available and reviewed as appropriate.  Description: The posterior dominant rhythm consists of 8-9 Hz activity of moderate voltage (25-35 uV) seen predominantly in posterior head regions, symmetric and reactive to eye opening and eye closing. Sleep was characterized by vertex waves, sleep spindles (12 to 14 Hz), maximal frontocentral region.  Hyperventilation and photic stimulation were not performed.     IMPRESSION: This study is within normal limits. No seizures or epileptiform discharges were seen throughout the recording.  A normal interictal EEG does not exclude nor support the diagnosis of epilepsy.   Huber Mathers O Erika Slaby

## 2024-04-16 NOTE — Progress Notes (Signed)
 EEG equipment transferred to 4NP02 with patient. [facility] now monitoring.

## 2024-04-16 NOTE — Evaluation (Signed)
 Physical Therapy Evaluation Patient Details Name: Joseph [name] MRN: [id number] DOB: 1946/02/18 Today's Date: 04/16/2024  History of Present Illness  Joseph [name] is a 78 y.o. male with recent admission for suspected syncopal episode who now returns after an episode of unresponsiveness; with medical history significant for hypertension, hyperlipidemia, atrial fibrillation on Eliquis , CVA, dementia, heart block with pacemaker, chronic HFpEF  Clinical Impression   Pt admitted with above diagnosis. Lives at home with wife, in a two-level home with a few steps to enter; Prior to very recent admission 9/27, pt was able to manage physical tasks independently, walked without an assistive device; needed assistance for bathing/dressing, but helped with IADLs as he could; Presents to PT with generalized weakness, decr coordination, decr functional mobility, slowed cognition with incr time to answer questions; Today, he needed mod assist to sit up to edge of stretcher, min assist to stand fro the high surface of the stretcher, and min assist to steady as he took small sidesteps toward head of stretcher; Despite being slow to answer questions, he remained engaged during session, and gave good effort; Recommend return to post-acute rehab to maximize independence and safety with mobility and ADLs;  Pt currently with functional limitations due to the deficits listed below (see PT Problem List). Pt will benefit from skilled PT to increase their independence and safety with mobility to allow discharge to the venue listed below.           If plan is discharge home, recommend the following: A little help with walking and/or transfers;A little help with bathing/dressing/bathroom;Assistance with cooking/housework;Help with stairs or ramp for entrance;Supervision due to cognitive status;Assist for transportation   Can travel by private vehicle   No    Equipment Recommendations Rolling walker (2  wheels);BSC/3in1;Wheelchair (measurements PT);Wheelchair cushion (measurements PT)  Recommendations for Other Services       Functional Status Assessment Patient has had a recent decline in their functional status and demonstrates the ability to make significant improvements in function in a reasonable and predictable amount of time.     Precautions / Restrictions Precautions Precautions: Fall Precaution/Restrictions Comments: watch BP Restrictions Weight Bearing Restrictions Per Provider Order: No      Mobility  Bed Mobility Overal bed mobility: Needs Assistance Bed Mobility: Supine to Sit, Sit to Supine     Supine to sit: Mod assist Sit to supine: Mod assist   General bed mobility comments: Cues to initiate and for sequencing; Mod assist to elevat trunk to sit on edge of stretcher    Transfers Overall transfer level: Needs assistance Equipment used: 1 person hand held assist Transfers: Sit to/from Stand, Bed to chair/wheelchair/BSC Sit to Stand: Min assist   Step pivot transfers: Min assist       General transfer comment: Stood from high surface of stretcher with min assist to steady; took sidesteps to [facility] and marched in place    Ambulation/Gait               General Gait Details: Deferred due to reported dizziness  Stairs            Wheelchair Mobility     Tilt Bed    Modified Rankin (Stroke Patients Only)       Balance     Sitting balance-Leahy Scale: Fair       Standing balance-Leahy Scale: Poor  Pertinent Vitals/Pain Pain Assessment Pain Assessment: No/denies pain    Home Living Family/patient expects to be discharged to:: Private residence Living Arrangements: Spouse/significant other Available Help at Discharge: Family;Available 24 hours/day Type of Home: House Home Access: Stairs to enter   [employer] of Steps: 1 + 1   Home Layout: Laundry or work area in  basement;Able to live on main level with bedroom/bathroom Home Equipment: None Additional Comments: recent admit, and pt dc'd to [facility] SNF    Prior Function               Mobility Comments: Prior to recent admission 04/08/2024, was able to ambulate without an AD ADLs Comments: Just prior to recent admission 04/08/2024, Ind to Mod for ADLs. Pt's wife engages him in IADLs as much as possible (ex. pt will carry the garbage out). Wife manages all other IADLs, including transportation.     Extremity/Trunk Assessment   Upper Extremity Assessment Upper Extremity Assessment: Defer to OT evaluation    Lower Extremity Assessment Lower Extremity Assessment: Generalized weakness RLE Deficits / Details: hip 3/5, knee 4/5, ankle 3/5 LLE Deficits / Details: hip 3/5, knee 4/5, ankle 3/5       Communication   Communication Communication: Impaired Factors Affecting Communication: Difficulty expressing self;Reduced clarity of speech    Cognition Arousal: Alert Behavior During Therapy: Flat affect   PT - Cognitive impairments: Orientation   Orientation impairments: Time, Place                     Following commands: Impaired Following commands impaired: Only follows one step commands consistently     Cueing Cueing Techniques: Verbal cues, Gestural cues, Tactile cues     General Comments General comments (skin integrity, edema, etc.): Incr dizziness with incr time in sitting    Exercises     Assessment/Plan    PT Assessment Patient needs continued PT services  PT Problem List Decreased activity tolerance;Decreased balance;Decreased mobility;Decreased knowledge of use of DME;Decreased safety awareness;Decreased knowledge of precautions;Cardiopulmonary status limiting activity       PT Treatment Interventions DME instruction;Gait training;Functional mobility training;Therapeutic activities;Therapeutic exercise;Balance training;Patient/family education    PT Goals  (Current goals can be found in the Care Plan section)  Acute Rehab PT Goals Patient Stated Goal: Did not state; agrees to standing trial PT Goal Formulation: Patient unable to participate in goal setting Time For Goal Achievement: 04/30/24 Potential to Achieve Goals: Fair    Frequency Min 2X/week     Co-evaluation               AM-PAC PT 6 Clicks Mobility  Outcome Measure Help needed turning from your back to your side while in a flat bed without using bedrails?: A Lot Help needed moving from lying on your back to sitting on the side of a flat bed without using bedrails?: A Lot Help needed moving to and from a bed to a chair (including a wheelchair)?: Total Help needed standing up from a chair using your arms (e.g., wheelchair or bedside chair)?: Total Help needed to walk in hospital room?: Total Help needed climbing 3-5 steps with a railing? : Total 6 Click Score: 8    End of Session Equipment Utilized During Treatment: Gait belt Activity Tolerance: Patient tolerated treatment well Patient left: in bed;with call bell/phone within reach Nurse Communication: Mobility status PT Visit Diagnosis: Unsteadiness on feet (R26.81);Muscle weakness (generalized) (M62.81)    Time: 8850-8778 PT Time Calculation (min) (ACUTE ONLY): 32 min  Charges:   PT Evaluation $PT Eval Moderate Complexity: 1 Mod PT Treatments $Therapeutic Activity: 8-22 mins PT General Charges $$ ACUTE PT VISIT: 1 Visit         Silvano Currier, PT  Acute Rehabilitation Services Office [phone] Secure Chat welcomed   Silvano VEAR Currier 04/16/2024, 1:33 PM

## 2024-04-16 NOTE — Progress Notes (Signed)
 LTM maint complete - no skin breakdown under:  Fp1,Fp2

## 2024-04-16 NOTE — Evaluation (Addendum)
 Occupational Therapy Evaluation Patient Details Name: Joseph [name] MRN: [id number] DOB: [birth year] Today's Date: 04/16/2024   History of Present Illness   78 y/o M presenting to ED on 10/4 from [facility] with episode of unresponsiveness/AMS, R weakness, CT head negative for acute abnormality. Admitted for acute encephalopathy. Recent admission 9/25-10/2 for syncopal episode with d/c to SNF.    PMH includes HTN, HLD, A fib on Eliquis , CVA, dementia, heart block with pacemaker, HFpEF     Clinical Impressions Pt with recent hospitalization, was d/c'd to SNF for a few days. Per spouse pt was ind at home prior to last hospital admission, at SNF was using RW and had assist for ADLs. Pt currently needs up to max A for ADLs, modA for bed mobility and min A for transfers and taking side steps to R HOB. Pt with L lateral lean in sitting and mild LUE ROM deficits compared to RUE. Pt with impaired cognition, needs cues for orientation to place, situation and time, follows commands with incr time. Pt presenting with impairments listed below, will follow acutely. Patient will benefit from continued inpatient follow up therapy, <3 hours/day.      If plan is discharge home, recommend the following:   A little help with walking and/or transfers;A lot of help with bathing/dressing/bathroom;Assistance with cooking/housework;Direct supervision/assist for financial management;Assist for transportation;Help with stairs or ramp for entrance;Direct supervision/assist for medications management     Functional Status Assessment   Patient has had a recent decline in their functional status and demonstrates the ability to make significant improvements in function in a reasonable and predictable amount of time.     Equipment Recommendations   Other (comment) (defer)     Recommendations for Other Services   PT consult     Precautions/Restrictions   Precautions Precautions:  Fall Precaution/Restrictions Comments: watch BP     Mobility Bed Mobility Overal bed mobility: Needs Assistance Bed Mobility: Supine to Sit, Sit to Supine     Supine to sit: Mod assist Sit to supine: Mod assist        Transfers Overall transfer level: Needs assistance Equipment used: 1 person hand held assist Transfers: Sit to/from Stand, Bed to chair/wheelchair/BSC Sit to Stand: Min assist                  Balance     Sitting balance-Leahy Scale: Fair     Standing balance support: Bilateral upper extremity supported Standing balance-Leahy Scale: Poor                             ADL either performed or assessed with clinical judgement   ADL Overall ADL's : Needs assistance/impaired Eating/Feeding: Minimal assistance;Sitting   Grooming: Minimal assistance;Sitting   Upper Body Bathing: Moderate assistance;Sitting   Lower Body Bathing: Maximal assistance;Sitting/lateral leans   Upper Body Dressing : Moderate assistance;Sitting   Lower Body Dressing: Maximal assistance;Sitting/lateral leans   Toilet Transfer: Minimal assistance   Toileting- Clothing Manipulation and Hygiene: Minimal assistance       Functional mobility during ADLs: Minimal assistance       Vision Patient Visual Report: No change from baseline       Perception Perception: Not tested       Praxis Praxis: Not tested       Pertinent Vitals/Pain Pain Assessment Pain Assessment: No/denies pain     Extremity/Trunk Assessment Upper Extremity Assessment Upper Extremity Assessment: Generalized weakness LUE Deficits / Details:  mild weakness with shoulder ROM compared to R   Lower Extremity Assessment Lower Extremity Assessment: Defer to PT evaluation RLE Deficits / Details: hip 3/5, knee 4/5, ankle 3/5 LLE Deficits / Details: hip 3/5, knee 4/5, ankle 3/5   Cervical / Trunk Assessment Cervical / Trunk Assessment: Kyphotic   Communication  Communication Communication: Impaired Factors Affecting Communication: Difficulty expressing self;Reduced clarity of speech   Cognition Arousal: Alert Behavior During Therapy: Flat affect Cognition: Cognition impaired, History of cognitive impairments   Orientation impairments: Situation, Place, Time (states he is at a motel, knows it is 2025 but not the month) Awareness: Online awareness impaired Memory impairment (select all impairments): Short-term memory   Executive functioning impairment (select all impairments): Organization, Sequencing, Reasoning, Problem solving OT - Cognition Comments: dementia at baseline                 Following commands: Impaired Following commands impaired: Only follows one step commands consistently     Cueing  General Comments   Cueing Techniques: Verbal cues;Gestural cues;Tactile cues  VSS   Exercises     Shoulder Instructions      Home Living Family/patient expects to be discharged to:: Private residence Living Arrangements: Spouse/significant other Available Help at Discharge: Family;Available 24 hours/day Type of Home: House Home Access: Stairs to enter [employer] of Steps: 1 + 1   Home Layout: One level;Laundry or work area in basement     [employer] Shower/Tub: Chief Strategy Officer: Handicapped height     Home Equipment: None   Additional Comments: recent admit, and pt dc'd to [facility]F and plans to return to SNF      Prior Functioning/Environment               Mobility Comments: was using RW at SNF, ind prior to last hosptialization ADLs Comments: ind before last hospital admission, has assist at rehab for ADLs    OT Problem List: Decreased strength;Decreased activity tolerance;Impaired balance (sitting and/or standing);Decreased coordination;Decreased cognition;Decreased safety awareness;Cardiopulmonary status limiting activity   OT Treatment/Interventions: Self-care/ADL  training;Therapeutic exercise;Energy conservation;DME and/or AE instruction;Therapeutic activities;Cognitive remediation/compensation;Patient/family education;Balance training      OT Goals(Current goals can be found in the care plan section)   Acute Rehab OT Goals Patient Stated Goal: to return to rehab OT Goal Formulation: With patient/family Time For Goal Achievement: 04/30/24 Potential to Achieve Goals: Good ADL Goals Pt Will Perform Upper Body Dressing: with supervision;sitting Pt Will Perform Lower Body Dressing: with min assist;sitting/lateral leans;sit to/from stand Pt Will Transfer to Toilet: with min assist;ambulating;regular height toilet Pt Will Perform Toileting - Clothing Manipulation and hygiene: with min assist;sitting/lateral leans;sit to/from stand Additional ADL Goal #1: pt will perform bed mobiltiy supervision in prep for seated and OOB ADLs   OT Frequency:  Min 2X/week    Co-evaluation              AM-PAC OT 6 Clicks Daily Activity     Outcome Measure Help from another person eating meals?: A Little Help from another person taking care of personal grooming?: A Little Help from another person toileting, which includes using toliet, bedpan, or urinal?: A Lot Help from another person bathing (including washing, rinsing, drying)?: A Lot Help from another person to put on and taking off regular upper body clothing?: A Lot Help from another person to put on and taking off regular lower body clothing?: A Lot 6 Click Score: 14   End of Session Equipment Utilized During Treatment: Gait  belt Nurse Communication: Mobility status  Activity Tolerance: Patient tolerated treatment well Patient left: in bed;with call bell/phone within reach;with family/visitor present  OT Visit Diagnosis: Unsteadiness on feet (R26.81);Other abnormalities of gait and mobility (R26.89);Muscle weakness (generalized) (M62.81);Other symptoms and signs involving cognitive function                 Time: 1446-1511 OT Time Calculation (min): 25 min Charges:  OT General Charges $OT Visit: 1 Visit OT Evaluation $OT Eval Moderate Complexity: 1 Mod OT Treatments $Self Care/Home Management : 8-22 mins  Marquavion Venhuizen K, OTD, OTR/L SecureChat Preferred Acute Rehab [phone]   Laneta POUR Koonce 04/16/2024, 3:49 PM

## 2024-04-16 NOTE — Progress Notes (Signed)
 LTM EEG hooked up and running - no initial skin breakdown - push button tested -Not monitored by [facility] while in ER

## 2024-04-16 NOTE — ED Notes (Signed)
 Report given to [facility].

## 2024-04-16 NOTE — Progress Notes (Signed)
 PROGRESS NOTE    [name]  FMW:[id number] DOB: 02-25-1946 DOA: 04/15/2024 PCP: Terry Wilhelmena Lloyd Hilario, FNP  Chief Complaint  Patient presents with   Altered Mental Status    Brief Narrative:   LOIC HOBIN is Joseph [name] 78 y.o. male with medical history significant for hypertension, hyperlipidemia, atrial fibrillation on Eliquis , CVA, dementia, heart block with pacemaker, chronic HFpEF, and recent admission for suspected syncopal episode who now returns after an episode of unresponsiveness.   Patient had been admitted to the hospital on 04/06/2024 after Joseph [name] transient loss of consciousness with right facial droop and AKI superimposed on CKD 3A.  He was seen by neurology during that hospitalization, had no acute findings on MRI or routine EEG, and had borderline positive orthostatic vitals.  Clonidine  and Cardura  were discontinued and he was discharged to an SNF on 04/13/2024.   Per report of the patient's wife at the bedside, the patient has continued to be much more fatigued and generally weak than he was prior to the recent admission. He then had an episode today after eating lunch where he appeared to be sleeping but his wife was unable to wake him. She was concerned that he appeared to be gagging or trying to throw up at 1 point during the episode. EMS was called and the patient eventually began to respond after 15 to 20 minutes but remained somnolent and confused.   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 Episode of unresponsiveness and right facial droop   - He had Joseph [name] similar episode recently that was felt to possibly be vasovagal; neurology notes that the right facial droop was documented by outpatient neurologist Joseph [name] year ago  - There were no acute findings on head  CT or CTA head & neck  - repeat orthostatics, therapy evals - Neurology planning for overnight EEG, will follow-up on any additional neurology recommendations (no seizures or epileptiform discharges as of this AM)   2. PAF  - Continue Eliquis  and Coreg      3. CKD 3A  - SCr is 1.63 on admission; baseline may be closer to 1.3  - mild improvement this AM - Hold Aldactone , Entresto , and hydrochlorothiazide  for now, renally-dose medications, repeat chem panel in am    4. Chronic HFpEF  - Appears compensated  - Holding diuretics and Entresto  initially in light of increased creatinine     # HTN - continuing coreg , entresto  on hold, spironolactone  on hold  5. Dementia; insomnia  - Continue Aricept  and trazodone, use delirium precautions    # BPH - alfuzosin    # Dyslipidemia - crestor   # Overactive Bladder myrbetriq      DVT prophylaxis: eliquis  Code Status: full Family Communication: none Disposition:   Status is: Observation The patient remains OBS appropriate and will d/c before 2 midnights.   Consultants:  neurology  Procedures:  EEG  Antimicrobials:  Anti-infectives (From admission, onward)    None       Subjective: Confused - can't say why he's here, but upset, says we're not listening to him No complaints of pain/discomfort  Objective: Vitals:   04/15/24 2349 04/16/24 0421 04/16/24 0600 04/16/24 0729  BP:  90/77 123/68 (!) 156/80  Pulse:  61 61 65  Resp:  20 15   Temp: 98.1 F (36.7 C) 98.2 F (36.8 C)    TempSrc: Oral Oral    SpO2:  99% 98%   Weight:      Height:       No intake or output data in the 24 hours ending 04/16/24 0820 Filed Weights   04/15/24 1516  Weight: 95.3 kg    Examination:  General exam: Appears calm and comfortable  Respiratory system: unlabored Cardiovascular system: RRR Gastrointestinal system: Abdomen is nondistended, soft and nontender.  Central nervous system: stuttering speech, moving all extremities,  confused Extremities: no LEE  Data Reviewed: I have personally reviewed following labs and imaging studies  CBC: Recent Labs  Lab 04/15/24 1530 04/16/24 0418  WBC 5.3 6.4  NEUTROABS 3.6  --   HGB 12.2* 11.1*  HCT 40.4 36.3*  MCV 72.4* 71.5*  PLT 225 207    Basic Metabolic Panel: Recent Labs  Lab 04/10/24 0208 04/11/24 0433 04/12/24 0213 04/15/24 1530 04/16/24 0418  NA 135 135 138 138 138  K 4.0 4.0 4.5 4.8 4.0  CL 106 105 106 106 103  CO2 22 23 22 23 25   GLUCOSE 110* 98 106* 116* 115*  BUN 24* 18 27* 24* 21  CREATININE 1.59* 1.19 1.65* 1.63* 1.54*  CALCIUM  8.6* 8.7* 8.6* 8.7* 8.7*    GFR: Estimated Creatinine Clearance: 47.9 mL/min (Joseph [name]) (by C-G formula based on SCr of 1.54 mg/dL (H)).  Liver Function Tests: Recent Labs  Lab 04/15/24 1530  AST 27  ALT 15  ALKPHOS 76  BILITOT 0.9  PROT 6.3*  ALBUMIN 3.3*    CBG: Recent Labs  Lab 04/15/24 1459  GLUCAP 175*     Recent Results (from the past 240 hours)  Resp panel by RT-PCR (RSV, Flu Aleese Kamps&B, Covid) Anterior Nasal Swab     Status: None   Collection Time: 04/15/24  3:34 PM   Specimen: Anterior Nasal Swab  Result Value Ref Range Status   SARS Coronavirus 2 by RT PCR NEGATIVE NEGATIVE Final   Influenza Eilam [place] by PCR NEGATIVE NEGATIVE Final   Influenza B by PCR NEGATIVE NEGATIVE Final    Comment: (NOTE) The Xpert Xpress SARS-CoV-2/FLU/RSV plus assay is intended as an aid in the diagnosis of influenza from Nasopharyngeal swab specimens and should not be used as [name] sole basis for treatment. Nasal washings and aspirates are unacceptable for Xpert Xpress SARS-CoV-2/FLU/RSV testing.  Fact Sheet for Patients: BloggerCourse.com  Fact Sheet for Healthcare Providers: SeriousBroker.it  This test is not yet approved or cleared by the [place]  FDA and has been authorized for detection and/or diagnosis of SARS-CoV-2 by FDA under an Emergency Use Authorization  (EUA). This EUA will remain in effect (meaning this test can be used) for the duration of the COVID-19 declaration under Section 564(b)(1) of the Act, 21 U.S.C. section 360bbb-3(b)(1), unless the authorization is terminated or revoked.     Resp Syncytial Virus by PCR NEGATIVE NEGATIVE Final    Comment: (NOTE) Fact Sheet for Patients: BloggerCourse.com  Fact Sheet for Healthcare Providers: SeriousBroker.it  This test is not yet approved or cleared by the [place]  FDA and has been authorized for detection and/or diagnosis of SARS-CoV-2 by FDA under an Emergency Use Authorization (EUA). This EUA will remain in effect (meaning this test can be used) for the duration of the COVID-19 declaration under Section 564(b)(1) of the Act, 21 U.S.C. section 360bbb-3(b)(1), unless the authorization is terminated or revoked.  Performed at [facility], [address] [place], [place] [address]          Radiology Studies: Overnight EEG with video Result Date: 04/16/2024 Shelton Arlin KIDD, MD     04/16/2024  6:04 AM Patient Name: Joseph [name] MRN: [id number] Epilepsy Attending: Arlin KIDD Shelton Referring Physician/Provider: Sallyann Normie CHRISTELLA, MD Duration: 04/16/2024 0350 to 0600 Patient history: 78 y.o. male with PMH of dementia, afib (on Eliquis ), tachybradycardia, heart block (s/p biventricular pacemaker), HTN, HF, who presents after transient episode of right facial droop and unresponsiveness. EEG to evaluate for seizure Level of alertness: Awake, asleep AEDs during EEG study: None Technical aspects: This EEG study was done with scalp electrodes positioned according to the 10-20 International system of electrode placement. Electrical activity was reviewed with band pass filter of 1-70Hz , sensitivity of 7 uV/mm, display speed of 72mm/sec with Avanthika Dehnert 60Hz  notched filter applied as appropriate. EEG data were recorded continuously and digitally stored.   Video monitoring was available and reviewed as appropriate. Description: The posterior dominant rhythm consists of 8-9 Hz activity of moderate voltage (25-35 uV) seen predominantly in posterior head regions, symmetric and reactive to eye opening and eye closing. Sleep was characterized by vertex waves, sleep spindles (12 to 14 Hz), maximal frontocentral region.  Hyperventilation and photic stimulation were not performed.   IMPRESSION: This study is within normal limits. No seizures or epileptiform discharges were seen throughout the recording. Yahaira Bruski normal interictal EEG does not exclude nor support the diagnosis of epilepsy. Arlin KIDD Shelton   CT HEAD WO CONTRAST Result Date: 04/15/2024 CLINICAL DATA:  Altered mental status. EXAM: CT HEAD WITHOUT CONTRAST TECHNIQUE: Contiguous axial images were obtained from the base of the skull through the vertex without intravenous contrast. RADIATION DOSE REDUCTION: This exam was performed according to the departmental dose-optimization program which includes automated exposure control, adjustment of the mA and/or kV according to patient size and/or use of iterative reconstruction technique. COMPARISON:  April 06, 2024 FINDINGS: Brain: There is generalized cerebral atrophy with widening of the extra-axial spaces and ventricular dilatation. There are areas of decreased attenuation within the white matter tracts of the supratentorial brain, consistent with microvascular disease changes. Chronic bilateral thalamic and basal ganglia lacunar infarcts are noted. Vascular: Marked severity bilateral cavernous carotid artery calcification is noted. Skull: Normal. Negative for fracture or focal lesion. Sinuses/Orbits: No acute finding. Other: None. IMPRESSION: 1. Generalized cerebral atrophy with chronic bilateral thalamic and basal ganglia lacunar infarcts. 2. No acute intracranial abnormality. Electronically Signed   By: Suzen Dials M.D.   On: 04/15/2024 16:56   DG Chest  Port 1 View Result Date: 04/15/2024 CLINICAL DATA:  Syncope. EXAM: PORTABLE CHEST 1 VIEW COMPARISON:  02/03/2022. FINDINGS: The heart is enlarged and the mediastinal contour is within normal limits. Lung volumes are low. No consolidation, effusion, or pneumothorax is seen. Mervil Wacker multi lead cardiac device is present over the left chest. No acute osseous abnormality. IMPRESSION: No active disease. Electronically Signed   By: Leita Birmingham M.D.   On: 04/15/2024 16:20        Scheduled Meds:  alfuzosin   10 mg Oral q morning   apixaban   5 mg Oral BID   carvedilol   12.5 mg Oral BID WC   donepezil   10 mg Oral Daily   mirabegron  ER  50 mg Oral q morning   rosuvastatin   20 mg Oral QHS   sodium chloride  flush  3 mL Intravenous Q12H   Continuous Infusions:   LOS: 0 days    Time  spent: over 30 min     Meliton Monte, MD [facility]   To contact the attending provider between 7A-7P or the covering provider during after hours 7P-7A, please log into the web site [email or web address] and access using universal [place] password for that web site. If you do not have the password, please call the hospital operator.  04/16/2024, 8:20 AM

## 2024-04-17 ENCOUNTER — Encounter (HOSPITAL_COMMUNITY)

## 2024-04-17 ENCOUNTER — Inpatient Hospital Stay (HOSPITAL_COMMUNITY)

## 2024-04-17 ENCOUNTER — Ambulatory Visit: Admitting: Internal Medicine

## 2024-04-17 DIAGNOSIS — R569 Unspecified convulsions: Secondary | ICD-10-CM | POA: Diagnosis not present

## 2024-04-17 DIAGNOSIS — G9340 Encephalopathy, unspecified: Secondary | ICD-10-CM | POA: Diagnosis not present

## 2024-04-17 LAB — CBC
HCT: 39.5 % (ref 39.0–52.0)
Hemoglobin: 12.2 g/dL — ABNORMAL LOW (ref 13.0–17.0)
MCH: 21.7 pg — ABNORMAL LOW (ref 26.0–34.0)
MCHC: 30.9 g/dL (ref 30.0–36.0)
MCV: 70.4 fL — ABNORMAL LOW (ref 80.0–100.0)
Platelets: 211 K/uL (ref 150–400)
RBC: 5.61 MIL/uL (ref 4.22–5.81)
RDW: 15.1 % (ref 11.5–15.5)
WBC: 5.5 K/uL (ref 4.0–10.5)
nRBC: 0 % (ref 0.0–0.2)

## 2024-04-17 LAB — BASIC METABOLIC PANEL WITH GFR
Anion gap: 9 (ref 5–15)
BUN: 23 mg/dL (ref 8–23)
CO2: 24 mmol/L (ref 22–32)
Calcium: 8.9 mg/dL (ref 8.9–10.3)
Chloride: 104 mmol/L (ref 98–111)
Creatinine, Ser: 1.55 mg/dL — ABNORMAL HIGH (ref 0.61–1.24)
GFR, Estimated: 46 mL/min — ABNORMAL LOW (ref >60)
Glucose, Bld: 89 mg/dL (ref 70–99)
Potassium: 4.3 mmol/L (ref 3.5–5.1)
Sodium: 137 mmol/L (ref 135–145)

## 2024-04-17 MED ORDER — SACUBITRIL-VALSARTAN 24-26 MG PO TABS
1.0000 | ORAL_TABLET | Freq: Two times a day (BID) | ORAL | Status: DC
  Administered 2024-04-17 – 2024-04-19 (×5): 1 via ORAL
  Filled 2024-04-17 (×6): qty 1

## 2024-04-17 NOTE — Plan of Care (Signed)
  Problem: Education: Goal: Knowledge of General Education information will improve Description: Including pain rating scale, medication(s)/side effects and non-pharmacologic comfort measures Outcome: Progressing   Problem: Health Behavior/Discharge Planning: Goal: Ability to manage health-related needs will improve Outcome: Progressing   Problem: Clinical Measurements: Goal: Ability to maintain clinical measurements within normal limits will improve Outcome: Progressing Goal: Diagnostic test results will improve Outcome: Progressing Goal: Respiratory complications will improve Outcome: Progressing Goal: Cardiovascular complication will be avoided Outcome: Progressing   Problem: Activity: Goal: Risk for activity intolerance will decrease Outcome: Progressing   Problem: Nutrition: Goal: Adequate nutrition will be maintained Outcome: Progressing   Problem: Coping: Goal: Level of anxiety will decrease Outcome: Progressing   Problem: Elimination: Goal: Will not experience complications related to bowel motility Outcome: Progressing Goal: Will not experience complications related to urinary retention Outcome: Progressing   Problem: Pain Managment: Goal: General experience of comfort will improve and/or be controlled Outcome: Progressing   Problem: Safety: Goal: Ability to remain free from injury will improve Outcome: Progressing   Problem: Skin Integrity: Goal: Risk for impaired skin integrity will decrease Outcome: Progressing   Problem: Education: Goal: Knowledge of condition and prescribed therapy will improve Outcome: Progressing   Problem: Cardiac: Goal: Will achieve and/or maintain adequate cardiac output Outcome: Progressing   Problem: Physical Regulation: Goal: Complications related to the disease process, condition or treatment will be avoided or minimized Outcome: Progressing

## 2024-04-17 NOTE — TOC Initial Note (Signed)
 Transition of Care [facility]) - Initial/Assessment Note    Patient Details  Name: Joseph [name] MRN: [id number] Date of Birth: 1946/02/16  Transition of Care [facility] CM/SW Contact:    Montie LOISE Louder, LCSW Phone Number: 04/17/2024, 2:10 PM  Clinical Narrative:                 Per chart review patient is not alert. CSW spoke with the patient's spouse, Dagoberto. CSW introduced self and explained role. She confirmed the patient arrived form [facility] and is expected to return once stable.   Patient will need authorization before returning to [facility]   Montie Louder, MSW, LCSW Clinical Social Worker    Expected Discharge Plan: Skilled Nursing Facility Barriers to Discharge: Continued Medical Work up   Patient Goals and CMS Choice            Expected Discharge Plan and Services In-house Referral: Clinical Social Work     Living arrangements for the past 2 months: Skilled Nursing Facility                                      Prior Living Arrangements/Services Living arrangements for the past 2 months: Skilled Nursing Facility Lives with:: Self                   Activities of Daily Living   ADL Screening (condition at time of admission) Independently performs ADLs?: Yes (appropriate for developmental age) Is the patient deaf or have difficulty hearing?: No Does the patient have difficulty seeing, even when wearing glasses/contacts?: No Does the patient have difficulty concentrating, remembering, or making decisions?: No  Permission Sought/Granted                  Emotional Assessment           Psych Involvement: No (comment)  Admission diagnosis:  Loss of consciousness (HCC) [R40.20] Acute encephalopathy [G93.40] Right sided weakness [R53.1] AMS (altered mental status) [R41.82] Patient Active Problem List   Diagnosis Date Noted   Dementia (HCC) 04/16/2024   CKD stage 3a, GFR 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) 04/16/2024   AMS (altered mental status)  04/16/2024   Acute encephalopathy 04/15/2024   Syncope and collapse 04/06/2024   Chronic dryness of both eyes 10/28/2023   Chronic hoarseness 06/29/2023   Prediabetes 06/29/2023   AKI (acute kidney injury) 02/03/2022   NICM (nonischemic cardiomyopathy) (HCC) 02/03/2022   Sinus node dysfunction (HCC)    Atrial flutter (HCC) 01/23/2022   OAB (overactive bladder) 12/27/2020   Nocturia 01/10/2020   Benign prostatic hyperplasia with urinary obstruction 01/10/2020   Non-recurrent unilateral inguinal hernia without obstruction or gangrene    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   History of adenomatous polyp of colon 02/27/2019   Paroxysmal atrial fibrillation (HCC) 07/18/2015   Bradycardia 06/29/2015   Near syncope    Atrial fibrillation, chronic (HCC) 06/27/2015   Left ventricular diastolic dysfunction, NYHA class 3 10/26/2014   Kidney cysts 04/04/2013   HTN (hypertension) 03/31/2013    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 [place] [address] Phone: [phone] Fax: [phone]     Social Drivers of Health (SDOH) Social History: SDOH Screenings   Food Insecurity: No Food Insecurity (04/16/2024)  Housing: Low Risk  (04/16/2024)  Transportation Needs: No Transportation Needs (04/16/2024)  Utilities: Not At Risk (04/16/2024)  Alcohol  Screen: Low Risk  (04/12/2023)  Depression (PHQ2-9): Low Risk  (03/16/2024)  Financial Resource Strain: Low Risk  (04/12/2023)  Physical Activity: Inactive (04/12/2023)  Social Connections: Socially Integrated (04/16/2024)  Stress: No Stress Concern Present (04/12/2023)  Tobacco Use: Medium Risk (04/16/2024)  Health  Literacy: Adequate Health Literacy (04/12/2023)   SDOH Interventions:     Readmission Risk Interventions     No data to display

## 2024-04-17 NOTE — Progress Notes (Signed)
 Physical Therapy Treatment Patient Details Name: Joseph [name] MRN: [id number] DOB: 03/08/1946 Today's Date: 04/17/2024   History of Present Illness 78 y/o M presenting to ED on 10/4 from [facility] with episode of unresponsiveness/AMS, R weakness, CT head negative for acute abnormality. Admitted for acute encephalopathy. Recent admission 9/25-10/2 for syncopal episode with d/c to SNF.    PMH includes HTN, HLD, A fib on Eliquis , CVA, dementia, heart block with pacemaker, HFpEF    PT Comments  Pt states he is ready to go upon PT arrival to room, states he wants to leave but not oriented to current location or situation. Pt overall requiring light assist for bed mobility and transfer OOB to recliner, pt ambulating x5 ft and declines further gait due to fatigue. Of note, pt with HR drop from 80s to 60 bpm after getting up to recliner, RN present during this. BP as below. PT to continue to follow.   BP, HR: - sitting: 187/75, 89 - standing: 159/92, 94 - standing 3 min: 181/88, 90s      If plan is discharge home, recommend the following: A little help with walking and/or transfers;A little help with bathing/dressing/bathroom;Assistance with cooking/housework;Help with stairs or ramp for entrance;Supervision due to cognitive status;Assist for transportation   Can travel by private vehicle        Equipment Recommendations  Rolling walker (2 wheels);BSC/3in1;Wheelchair (measurements PT);Wheelchair cushion (measurements PT)    Recommendations for Other Services       Precautions / Restrictions Precautions Precautions: Fall Precaution/Restrictions Comments: watch BP Restrictions Weight Bearing Restrictions Per Provider Order: No     Mobility  Bed Mobility Overal bed mobility: Needs Assistance Bed Mobility: Supine to Sit, Sit to Supine     Supine to sit: Mod assist, HOB elevated, Used rails     General bed mobility comments: assist for trunk elevation, LE progression to EOB,  and scooting towards EOB with bed pad.    Transfers Overall transfer level: Needs assistance Equipment used: Rolling walker (2 wheels) Transfers: Sit to/from Stand Sit to Stand: Min assist           General transfer comment: assist for rise and steady, cues for hand placement. stand x3 , x1 from EOB and x2 from recliner.    Ambulation/Gait Ambulation/Gait assistance: Min assist Gait Distance (Feet): 5 Feet Assistive device: Rolling walker (2 wheels) Gait Pattern/deviations: Step-through pattern, Decreased stride length, Trunk flexed Gait velocity: decr     General Gait Details: assist to steady, guide RW   Stairs             Wheelchair Mobility     Tilt Bed    Modified Rankin (Stroke Patients Only)       Balance Overall balance assessment: Needs assistance Sitting-balance support: No upper extremity supported, Feet supported Sitting balance-Leahy Scale: Fair     Standing balance support: Bilateral upper extremity supported, Reliant on assistive device for balance Standing balance-Leahy Scale: Poor                              Communication Communication Communication: Impaired Factors Affecting Communication: Difficulty expressing self;Reduced clarity of speech  Cognition Arousal: Alert Behavior During Therapy: Flat affect   PT - Cognitive impairments: Orientation, Attention, Sequencing, Problem solving, Safety/Judgement                       PT - Cognition Comments: pt states he is in  a motel, PT reoriented pt and when PT asked pt to recall location x5 minutes later pt does not know. Pt requires max, one-step commands   Following commands impaired: Follows one step commands with increased time, Only follows one step commands consistently    Cueing Cueing Techniques: Verbal cues, Gestural cues, Tactile cues  Exercises      General Comments General comments (skin integrity, edema, etc.): HR drop from 80s to 60 bpm s/p  transfer OOB to recliner, RN in room and witnessed      Pertinent Vitals/Pain Pain Assessment Pain Assessment: No/denies pain    Home Living                          Prior Function            PT Goals (current goals can now be found in the care plan section) Acute Rehab PT Goals PT Goal Formulation: With patient/family Time For Goal Achievement: 04/30/24 Potential to Achieve Goals: Fair Progress towards PT goals: Progressing toward goals    Frequency    Min 2X/week      PT Plan      Co-evaluation              AM-PAC PT 6 Clicks Mobility   Outcome Measure  Help needed turning from your back to your side while in a flat bed without using bedrails?: A Lot Help needed moving from lying on your back to sitting on the side of a flat bed without using bedrails?: A Lot Help needed moving to and from a bed to a chair (including a wheelchair)?: A Lot Help needed standing up from a chair using your arms (e.g., wheelchair or bedside chair)?: A Lot Help needed to walk in hospital room?: A Lot Help needed climbing 3-5 steps with a railing? : Total 6 Click Score: 11    End of Session Equipment Utilized During Treatment: Gait belt Activity Tolerance: Patient tolerated treatment well Patient left: with call bell/phone within reach;in chair;with chair alarm set;with nursing/sitter in room Nurse Communication: Mobility status PT Visit Diagnosis: Unsteadiness on feet (R26.81);Muscle weakness (generalized) (M62.81)     Time: 8587-8562 PT Time Calculation (min) (ACUTE ONLY): 25 min  Charges:    $Therapeutic Activity: 23-37 mins PT General Charges $$ ACUTE PT VISIT: 1 Visit                     Johana RAMAN, PT DPT Acute Rehabilitation Services Secure Chat Preferred  Office [phone]    Joseph [name] 04/17/2024, 4:54 PM

## 2024-04-17 NOTE — Progress Notes (Signed)
 vLTM maintenance  All impedances below 10k.  No skin breakdown noted at  FP1  FP2   F4  F3  FZ

## 2024-04-17 NOTE — Progress Notes (Signed)
 Patient remains on EEG monitoring, no abnormal activities noted.

## 2024-04-17 NOTE — Progress Notes (Signed)
 LTM EEG disconnected - no skin breakdown at [facility]. [facility] notified.

## 2024-04-17 NOTE — Procedures (Addendum)
 Patient Name: Joseph [name]  MRN: [id number]  Epilepsy Attending: Arlin MALVA Krebs  Referring Physician/Provider: Sallyann Normie CHRISTELLA, MD  Duration: 04/17/2024 0350 to 04/17/2024  1638   Patient history: 78 y.o. male with PMH of dementia, afib (on Eliquis ), tachybradycardia, heart block (s/p biventricular pacemaker), HTN, HF, who presents after transient episode of right facial droop and unresponsiveness. EEG to evaluate for seizure    Level of alertness: Awake, asleep   AEDs during EEG study: None   Technical aspects: This EEG study was done with scalp electrodes positioned according to the 10-20 International system of electrode placement. Electrical activity was reviewed with band pass filter of 1-70Hz , sensitivity of 7 uV/mm, display speed of 46mm/sec with a 60Hz  notched filter applied as appropriate. EEG data were recorded continuously and digitally stored.  Video monitoring was available and reviewed as appropriate.   Description: The posterior dominant rhythm consists of 8-9 Hz activity of moderate voltage (25-35 uV) seen predominantly in posterior head regions, symmetric and reactive to eye opening and eye closing. Sleep was characterized by vertex waves, sleep spindles (12 to 14 Hz), maximal frontocentral region.  Hyperventilation and photic stimulation were not performed.        IMPRESSION: This study is within normal limits. No seizures or epileptiform discharges were seen throughout the recording.   A normal interictal EEG does not exclude nor support the diagnosis of epilepsy.     Jamarquis Crull O Kamree Wiens

## 2024-04-17 NOTE — Progress Notes (Signed)
 PROGRESS NOTE    Joseph [name]  FMW:[id number] DOB: 11-10-1945 DOA: 04/15/2024 PCP: Terry Wilhelmena Lloyd Hilario, FNP  Chief Complaint  Patient presents with   Altered Mental Status    Brief Narrative:   Joseph [name] is Joseph [name] 78 y.o. male with medical history significant for hypertension, hyperlipidemia, atrial fibrillation on Eliquis , CVA, dementia, heart block with pacemaker, chronic HFpEF, and recent admission for suspected syncopal episode who now returns after an episode of unresponsiveness.   Patient had been admitted to the hospital on 04/06/2024 after Joseph [name] transient loss of consciousness with right facial droop and AKI superimposed on CKD 3A.  He was seen by neurology during that hospitalization, had no acute findings on MRI or routine EEG, and had borderline positive orthostatic vitals.  Clonidine  and Cardura  were discontinued and he was discharged to an SNF on 04/13/2024.   Per report of the patient's wife at the bedside, the patient has continued to be much more fatigued and generally weak than he was prior to the recent admission. He then had an episode today after eating lunch where he appeared to be sleeping but his wife was unable to wake him. She was concerned that he appeared to be gagging or trying to throw up at 1 point during the episode. EMS was called and the patient eventually began to respond after 15 to 20 minutes but remained somnolent and confused.   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 Episode of unresponsiveness and right facial droop   - He had Sumiko Ceasar similar episode recently that was felt to possibly be vasovagal; neurology notes that the right facial droop was documented by outpatient neurologist Zimir Kittleson year ago  - There  were no acute findings on head CT or CTA head & neck  - repeat orthostatics, therapy evals - EEG per neurology - appreciate further recommendations   2. PAF  - Continue Eliquis  and Coreg      3. CKD 3A  - SCr is 1.63 on admission; baseline may be closer to 1.3  - 1.5 today - Hold Aldactone , Entresto , and hydrochlorothiazide  for now, renally-dose medications, repeat chem panel in am    4. Chronic HFpEF  - Appears compensated  - Holding diuretics and Entresto  initially in light of increased creatinine     # HTN - continuing coreg , entresto  on hold, spironolactone  on hold  5. Dementia; insomnia  - Continue Aricept  and trazodone, use delirium precautions    # BPH - alfuzosin    # Dyslipidemia - crestor   # Overactive Bladder myrbetriq      DVT prophylaxis: eliquis  Code Status: full Family Communication: none Disposition:   Status is: Observation The patient remains OBS appropriate and will d/c before 2 midnights.   Consultants:  neurology  Procedures:  EEG  Antimicrobials:  Anti-infectives (From admission, onward)    None       Subjective: Wife at bedside In better mood today No complaints  Objective: Vitals:   04/16/24 2027 04/16/24 2319 04/17/24 0309 04/17/24 0816  BP: (!) 152/70 (!) 164/78 (!) 141/83 (!) 171/80  Pulse: (!) 59 63 60 81  Resp: 18 19 13 11   Temp: 98.7 F (37.1 C) 98.2 F (36.8 C) 98.5 F (36.9  C) 98.6 F (37 C)  TempSrc: Oral Oral Oral Oral  SpO2: 94% 99% 96% 96%  Weight:      Height:        Intake/Output Summary (Last 24 hours) at 04/17/2024 1300 Last data filed at 04/17/2024 0546 Gross per 24 hour  Intake 163 ml  Output 300 ml  Net -137 ml   Filed Weights   04/15/24 1516  Weight: 95.3 kg    Examination:  General: No acute distress. Cardiovascular: RRR Lungs: unlabored Neurological: Alert. Moves all extremities 4 . Cranial nerves II through XII grossly intact. Extremities: No clubbing or cyanosis. No edema.  ing  labs and imaging studies  CBC: Recent Labs  Lab 04/15/24 1530 04/16/24 0418 04/17/24 0655  WBC 5.3 6.4 5.5  NEUTROABS 3.6  --   --   HGB 12.2* 11.1* 12.2*  HCT 40.4 36.3* 39.5  MCV 72.4* 71.5* 70.4*  PLT 225 207 211    Basic Metabolic Panel: Recent Labs  Lab 04/11/24 0433 04/12/24 0213 04/15/24 1530 04/16/24 0418 04/17/24 0655  NA 135 138 138 138 137  K 4.0 4.5 4.8 4.0 4.3  CL 105 106 106 103 104  CO2 23 22 23 25 24   GLUCOSE 98 106* 116* 115* 89  BUN 18 27* 24* 21 23  CREATININE 1.19 1.65* 1.63* 1.54* 1.55*  CALCIUM  8.7* 8.6* 8.7* 8.7* 8.9    GFR: Estimated Creatinine Clearance: 47.6 mL/min (Favour Aleshire) (by C-G formula based on SCr of 1.55 mg/dL (H)).  Liver Function Tests: Recent Labs  Lab 04/15/24 1530  AST 27  ALT 15  ALKPHOS 76  BILITOT 0.9  PROT 6.3*  ALBUMIN 3.3*    CBG: Recent Labs  Lab 04/15/24 1459  GLUCAP 175*     Recent Results (from the past 240 hours)  Resp panel by RT-PCR (RSV, Flu Luther Newhouse&B, Covid) Anterior Nasal Swab     Status: None   Collection Time: 04/15/24  3:34 PM   Specimen: Anterior Nasal Swab  Result Value Ref Range Status   SARS Coronavirus 2 by RT PCR NEGATIVE NEGATIVE Final   Influenza Icey Tello by PCR NEGATIVE NEGATIVE Final   Influenza B by PCR NEGATIVE NEGATIVE Final    Comment: (NOTE) The Xpert Xpress SARS-CoV-2/FLU/RSV plus assay is intended as an aid in the diagnosis of influenza from Nasopharyngeal swab specimens and should not be used as Keithan Dileonardo sole basis for treatment. Nasal washings and aspirates are unacceptable for Xpert Xpress SARS-CoV-2/FLU/RSV testing.  Fact Sheet for Patients: BloggerCourse.com  Fact Sheet for Healthcare Providers: SeriousBroker.it  This test is not yet approved or cleared by the [place]  FDA and has been authorized for detection and/or diagnosis of SARS-CoV-2 by FDA under an Emergency Use Authorization (EUA). This EUA will remain in effect (meaning  this test can be used) for the duration of the COVID-19 declaration under Section 564(b)(1) of the Act, 21 U.S.C. section 360bbb-3(b)(1), unless the authorization is terminated or revoked.     Resp Syncytial Virus by PCR NEGATIVE NEGATIVE Final    Comment: (NOTE) Fact Sheet for Patients: BloggerCourse.com  Fact Sheet for Healthcare Providers: SeriousBroker.it  This test is not yet approved or cleared by the [place]  FDA and has been authorized for detection and/or diagnosis of SARS-CoV-2 by FDA under an Emergency Use Authorization (EUA). This EUA will remain in effect (meaning this test can be used) for the duration of the COVID-19 declaration under Section 564(b)(1) of the Act, 21 U.S.C. section 360bbb-3(b)(1), unless the authorization is terminated  or revoked.  Performed at [facility], [address] [place], [place] [address]          Radiology Studies: Overnight EEG with video Result Date: 04/16/2024 Shelton Arlin KIDD, MD     04/17/2024  7:00 AM Patient Name: Joseph [name] MRN: [id number] Epilepsy Attending: Arlin KIDD Shelton Referring Physician/Provider: Sallyann Normie CHRISTELLA, MD Duration: 04/16/2024 0350 to 04/17/2024 0350 Patient history: 79 y.o. male with PMH of dementia, afib (on Eliquis ), tachybradycardia, heart block (s/p biventricular pacemaker), HTN, HF, who presents after transient episode of right facial droop and unresponsiveness. EEG to evaluate for seizure Level of alertness: Awake, asleep AEDs during EEG study: None Technical aspects: This EEG study was done with scalp electrodes positioned according to the 10-20 International system of electrode placement. Electrical activity was reviewed with band pass filter of 1-70Hz , sensitivity of 7 uV/mm, display speed of 63mm/sec with Jadien Lehigh 60Hz  notched filter applied as appropriate. EEG data were recorded continuously and digitally stored.  Video monitoring was available and  reviewed as appropriate. Description: The posterior dominant rhythm consists of 8-9 Hz activity of moderate voltage (25-35 uV) seen predominantly in posterior head regions, symmetric and reactive to eye opening and eye closing. Sleep was characterized by vertex waves, sleep spindles (12 to 14 Hz), maximal frontocentral region.  Hyperventilation and photic stimulation were not performed.   IMPRESSION: This study is within normal limits. No seizures or epileptiform discharges were seen throughout the recording. Derrell Milanes normal interictal EEG does not exclude nor support the diagnosis of epilepsy. Arlin KIDD Shelton   CT HEAD WO CONTRAST Result Date: 04/15/2024 CLINICAL DATA:  Altered mental status. EXAM: CT HEAD WITHOUT CONTRAST TECHNIQUE: Contiguous axial images were obtained from the base of the skull through the vertex without intravenous contrast. RADIATION DOSE REDUCTION: This exam was performed according to the departmental dose-optimization program which includes automated exposure control, adjustment of the mA and/or kV according to patient size and/or use of iterative reconstruction technique. COMPARISON:  April 06, 2024 FINDINGS: Brain: There is generalized cerebral atrophy with widening of the extra-axial spaces and ventricular dilatation. There are areas of decreased attenuation within the white matter tracts of the supratentorial brain, consistent with microvascular disease changes. Chronic bilateral thalamic and basal ganglia lacunar infarcts are noted. Vascular: Marked severity bilateral cavernous carotid artery calcification is noted. Skull: Normal. Negative for fracture or focal lesion. Sinuses/Orbits: No acute finding. Other: None. IMPRESSION: 1. Generalized cerebral atrophy with chronic bilateral thalamic and basal ganglia lacunar infarcts. 2. No acute intracranial abnormality. Electronically Signed   By: Suzen Dials M.D.   On: 04/15/2024 16:56   DG Chest Port 1 View Result Date:  04/15/2024 CLINICAL DATA:  Syncope. EXAM: PORTABLE CHEST 1 VIEW COMPARISON:  02/03/2022. FINDINGS: The heart is enlarged and the mediastinal contour is within normal limits. Lung volumes are low. No consolidation, effusion, or pneumothorax is seen. Cosette Prindle multi lead cardiac device is present over the left chest. No acute osseous abnormality. IMPRESSION: No active disease. Electronically Signed   By: Leita Birmingham M.D.   On: 04/15/2024 16:20        Scheduled Meds:  alfuzosin   10 mg Oral q morning   apixaban   5 mg Oral BID   carvedilol   12.5 mg Oral BID WC   donepezil   10 mg Oral Daily   mirabegron  ER  50 mg Oral q morning   rosuvastatin   20 mg Oral QHS   sodium chloride  flush  3 mL Intravenous Q12H  Continuous Infusions:   LOS: 1 day    Time spent: over 30 min     Meliton Monte, MD [facility]   To contact the attending provider between 7A-7P or the covering provider during after hours 7P-7A, please log into the web site [email or web address] and access using universal [place] password for that web site. If you do not have the password, please call the hospital operator.  04/17/2024, 1:00 PM

## 2024-04-18 DIAGNOSIS — G9340 Encephalopathy, unspecified: Secondary | ICD-10-CM | POA: Diagnosis not present

## 2024-04-18 LAB — CBC
HCT: 36.7 % — ABNORMAL LOW (ref 39.0–52.0)
Hemoglobin: 11.4 g/dL — ABNORMAL LOW (ref 13.0–17.0)
MCH: 21.8 pg — ABNORMAL LOW (ref 26.0–34.0)
MCHC: 31.1 g/dL (ref 30.0–36.0)
MCV: 70.3 fL — ABNORMAL LOW (ref 80.0–100.0)
Platelets: 200 K/uL (ref 150–400)
RBC: 5.22 MIL/uL (ref 4.22–5.81)
RDW: 15 % (ref 11.5–15.5)
WBC: 6.3 K/uL (ref 4.0–10.5)
nRBC: 0 % (ref 0.0–0.2)

## 2024-04-18 LAB — BASIC METABOLIC PANEL WITH GFR
Anion gap: 8 (ref 5–15)
BUN: 25 mg/dL — ABNORMAL HIGH (ref 8–23)
CO2: 26 mmol/L (ref 22–32)
Calcium: 8.7 mg/dL — ABNORMAL LOW (ref 8.9–10.3)
Chloride: 104 mmol/L (ref 98–111)
Creatinine, Ser: 1.49 mg/dL — ABNORMAL HIGH (ref 0.61–1.24)
GFR, Estimated: 48 mL/min — ABNORMAL LOW (ref >60)
Glucose, Bld: 101 mg/dL — ABNORMAL HIGH (ref 70–99)
Potassium: 4.2 mmol/L (ref 3.5–5.1)
Sodium: 138 mmol/L (ref 135–145)

## 2024-04-18 NOTE — TOC Progression Note (Signed)
 Transition of Care [facility]) - Progression Note    Patient Details  Name: Joseph [name] MRN: [id number] Date of Birth: August 07, 1945  [facility] CM/SW Contact  Montie LOISE Louder, [place] Phone Number: 04/18/2024, 4:50 PM  Clinical Narrative:     Insurance still pending for SNF  Montie Louder, MSW, LCSW Clinical Social Worker    Expected Discharge Plan: Skilled Nursing Facility Barriers to Discharge: Continued Medical Work up               Expected Discharge Plan and Services In-house Referral: Clinical Social Work     Living arrangements for the past 2 months: Skilled Holiday representative                                       Social Drivers of Health (SDOH) Interventions SDOH Screenings   Food Insecurity: No Food Insecurity (04/16/2024)  Housing: Low Risk  (04/16/2024)  Transportation Needs: No Transportation Needs (04/16/2024)  Utilities: Not At Risk (04/16/2024)  Alcohol  Screen: Low Risk  (04/12/2023)  Depression (PHQ2-9): Low Risk  (03/16/2024)  Financial Resource Strain: Low Risk  (04/12/2023)  Physical Activity: Inactive (04/12/2023)  Social Connections: Socially Integrated (04/16/2024)  Stress: No Stress Concern Present (04/12/2023)  Tobacco Use: Medium Risk (04/16/2024)  Health Literacy: Adequate Health Literacy (04/12/2023)    Readmission Risk Interventions     No data to display

## 2024-04-18 NOTE — Plan of Care (Signed)
  Problem: Education: Goal: Knowledge of General Education information will improve Description: Including pain rating scale, medication(s)/side effects and non-pharmacologic comfort measures Outcome: Progressing   Problem: Health Behavior/Discharge Planning: Goal: Ability to manage health-related needs will improve Outcome: Progressing   Problem: Clinical Measurements: Goal: Ability to maintain clinical measurements within normal limits will improve Outcome: Progressing Goal: Diagnostic test results will improve Outcome: Progressing Goal: Respiratory complications will improve Outcome: Progressing Goal: Cardiovascular complication will be avoided Outcome: Progressing   Problem: Activity: Goal: Risk for activity intolerance will decrease Outcome: Progressing   Problem: Nutrition: Goal: Adequate nutrition will be maintained Outcome: Progressing   Problem: Coping: Goal: Level of anxiety will decrease Outcome: Progressing   Problem: Elimination: Goal: Will not experience complications related to bowel motility Outcome: Progressing Goal: Will not experience complications related to urinary retention Outcome: Progressing   Problem: Pain Managment: Goal: General experience of comfort will improve and/or be controlled Outcome: Progressing   Problem: Safety: Goal: Ability to remain free from injury will improve Outcome: Progressing   Problem: Skin Integrity: Goal: Risk for impaired skin integrity will decrease Outcome: Progressing   Problem: Education: Goal: Knowledge of condition and prescribed therapy will improve Outcome: Progressing   Problem: Cardiac: Goal: Will achieve and/or maintain adequate cardiac output Outcome: Progressing   Problem: Physical Regulation: Goal: Complications related to the disease process, condition or treatment will be avoided or minimized Outcome: Progressing

## 2024-04-18 NOTE — NC FL2 (Signed)
 [place]  MEDICAID FL2 LEVEL OF CARE FORM     IDENTIFICATION  Patient Name: Joseph [name] Birthdate: 05/07/1946 Sex: male Admission Date (Current Location): 04/15/2024  [facility] and [place] Number:  Producer, television/film/video and Address:  [facility] [place], [address], [place], [place] [address]      Provider Number: [id number]  Attending Physician Name and Address:  Perri DELENA Meliton Mickey., *  Relative Name and Phone Number:       Current Level of Care: Hospital Recommended Level of Care: Skilled Nursing Facility Prior Approval Number:    Date Approved/Denied:   PASRR Number: [id number]  Discharge Plan: SNF    Current Diagnoses: Patient Active Problem List   Diagnosis Date Noted   Dementia (HCC) 04/16/2024   CKD stage 3a, GFR 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) 04/16/2024   AMS (altered mental status) 04/16/2024   Acute encephalopathy 04/15/2024   Syncope and collapse 04/06/2024   Chronic dryness of both eyes 10/28/2023   Chronic hoarseness 06/29/2023   Prediabetes 06/29/2023   AKI (acute kidney injury) 02/03/2022   NICM (nonischemic cardiomyopathy) (HCC) 02/03/2022   Sinus node dysfunction (HCC)    Atrial flutter (HCC) 01/23/2022   OAB (overactive bladder) 12/27/2020   Nocturia 01/10/2020   Benign prostatic hyperplasia with urinary obstruction 01/10/2020   Non-recurrent unilateral inguinal hernia without obstruction or gangrene    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   History of adenomatous polyp of colon 02/27/2019   Paroxysmal atrial fibrillation (HCC) 07/18/2015   Bradycardia 06/29/2015   Near syncope    Atrial fibrillation, chronic (HCC) 06/27/2015   Left ventricular diastolic dysfunction, NYHA class 3 10/26/2014   Kidney cysts 04/04/2013   HTN (hypertension) 03/31/2013    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 03/31/2013   Diastolic dysfunction- grade 2- echo 06/28/15 03/31/2013   RBBB 03/31/2013   AV block, 1st degree 03/31/2013   Sinus bradycardia 03/31/2013    Hematochezia 12/29/2011    Orientation RESPIRATION BLADDER Height & Weight     Self, Time, Place  Normal Incontinent Weight: 210 lb (95.3 kg) Height:  5' 10.8 (179.8 cm)  BEHAVIORAL SYMPTOMS/MOOD NEUROLOGICAL BOWEL NUTRITION STATUS      Continent Diet (see d/c summary)  AMBULATORY STATUS COMMUNICATION OF NEEDS Skin   Extensive Assist Verbally Normal                       Personal Care Assistance Level of Assistance  Bathing, Feeding, Dressing Bathing Assistance: Limited assistance Feeding assistance: Independent Dressing Assistance: Limited assistance     Functional Limitations Info  Hearing, Speech, Sight Sight Info: Adequate Hearing Info: Adequate Speech Info: Adequate    SPECIAL CARE FACTORS FREQUENCY  OT (By licensed OT), PT (By licensed PT)     PT Frequency: 5x/week OT Frequency: 5x/week            Contractures Contractures Info: Not present    Additional Factors Info  Code Status, Allergies Code Status Info: full code Allergies Info: no known allergies           Current Medications (04/18/2024):  This is the current hospital active medication list Current Facility-Administered Medications  Medication Dose Route Frequency Provider Last Rate Last Admin   acetaminophen  (TYLENOL ) tablet 650 mg  650 mg Oral Q6H PRN Opyd, Timothy S, MD       Or   acetaminophen  (TYLENOL ) suppository 650 mg  650 mg Rectal Q6H PRN Opyd, Evalene RAMAN, MD  alfuzosin  (UROXATRAL ) 24 hr tablet 10 mg  10 mg Oral q morning Opyd, Timothy S, MD   10 mg at 04/18/24 9185   apixaban  (ELIQUIS ) tablet 5 mg  5 mg Oral BID Opyd, Timothy S, MD   5 mg at 04/18/24 9185   carvedilol  (COREG ) tablet 12.5 mg  12.5 mg Oral BID WC Opyd, Timothy S, MD   12.5 mg at 04/18/24 9185   donepezil  (ARICEPT ) tablet 10 mg  10 mg Oral Daily Opyd, Timothy S, MD   10 mg at 04/18/24 9185   haloperidol  lactate (HALDOL ) injection 1 mg  1 mg Intravenous Q6H PRN Perri DELENA Meliton Mickey., MD   1 mg at 04/16/24 0940    mirabegron  ER (MYRBETRIQ ) tablet 50 mg  50 mg Oral q morning Opyd, Timothy S, MD   50 mg at 04/18/24 0814   prochlorperazine (COMPAZINE) injection 5 mg  5 mg Intravenous Q6H PRN Opyd, Evalene RAMAN, MD       rosuvastatin  (CRESTOR ) tablet 20 mg  20 mg Oral QHS Opyd, Timothy S, MD   20 mg at 04/17/24 2141   sacubitril -valsartan  (ENTRESTO ) 24-26 mg per tablet  1 tablet Oral BID Perri DELENA Meliton Mickey., MD   1 tablet at 04/18/24 0814   senna (SENOKOT) tablet 8.6 mg  1 tablet Oral Daily PRN Opyd, Timothy S, MD       sodium chloride  flush (NS) 0.9 % injection 3 mL  3 mL Intravenous Q12H Opyd, Timothy S, MD   3 mL at 04/18/24 0815   traZODone (DESYREL) tablet 50 mg  50 mg Oral QHS PRN Opyd, Timothy S, MD         Discharge Medications: Please see discharge summary for a list of discharge medications.  Relevant Imaging Results:  Relevant Lab Results:   Additional Information SSN: [id number]  Luann SHAUNNA Cumming, LCSW

## 2024-04-18 NOTE — Discharge Summary (Addendum)
 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 male with medical history significant for hypertension, hyperlipidemia, atrial fibrillation on Eliquis , CVA, dementia, heart block with pacemaker, chronic HFpEF, and recent admission for suspected syncopal episode who now returns after an episode of unresponsiveness.    Patient had been admitted to the hospital on 04/06/2024 after [name] transient loss of consciousness with right facial droop and AKI superimposed on CKD 3A.  He was seen by neurology during that hospitalization, had no acute findings on MRI or routine EEG, and had borderline positive orthostatic vitals.  Clonidine  and Cardura  were discontinued and he was discharged to an SNF on 04/13/2024.    Per report of the patient's wife at the bedside, the patient has continued to be much more fatigued and generally weak than he was prior to the recent admission. He then had an episode after eating lunch where he appeared to be sleeping but his wife was unable to wake him. She was concerned that he appeared to  be gagging or trying to throw up at 1 point during the episode. EMS was called and the patient eventually began to respond after 15 to 20 minutes but remained somnolent and confused.   He was seen by neurology here, overnight EEG was without seizures or epileptiform activity.  BP liberalized, holding spironolactone  and hydrochlorothiazide  at discharge.    Stable for discharge 10/7.   Hospital Course:  Assessment and Plan:  1. Episode of unresponsiveness and right facial droop   - He had [name] similar episode recently that was felt to possibly be vasovagal; neurology notes that the right facial droop was documented by outpatient neurologist [name] year ago  - liberalized BP (holding thiazide, spironolactone  at discharge) given concern for positive orthostatics at last admission and episode of unresponsiveness - There were no acute findings on head CT or CTA head & neck  - repeat orthostatics (negative), therapy evals - EEG without seizures or epileptiform activities   2. PAF  - Continue Eliquis  and Coreg      3. CKD 3A  - SCr is 1.63 on admission; baseline may be closer to 1.3  - 1.5 today on discharge - Hold Aldactone  and hydrochlorothiazide  for now, follow outpatient (we've resumed entresto )   4. Chronic HFmrEF  - coreg , entresto  - thiazide and spironolactone  on hold  - echo 03/2024 with EF 40-45%, diastolic dysfunction - no AS   # HTN - continuing coreg , entresto   - spironolactone  and hydrochlorothiazide  on hold - follow outpatient   5. Dementia; insomnia  - Continue Aricept  and trazodone, use delirium precautions     # BPH - alfuzosin     #  Dyslipidemia - crestor    # Overactive Bladder myrbetriq     Procedures:  EEG IMPRESSION: This study is within normal limits. No seizures or epileptiform discharges were seen throughout the recording.   [name] normal interictal EEG does not exclude nor support the diagnosis of epilepsy.  Consultations: neurology  Discharge Exam: Vitals:    04/18/24 0300 04/18/24 0814  BP: (!) 159/81 (!) 156/74  Pulse: 64 81  Resp: 20 (!) 24  Temp: 98.3 F (36.8 C) 99 F (37.2 C)  SpO2: 100% 99%   No new complaints Wife at bedside - discussed d/c planning  General: No acute distress. Cardiovascular: RRR Lungs: unlabored Neurological: Alert. Moves all extremities 4 with equal strength. Cranial nerves II through XII grossly intact. Extremities: No clubbing or cyanosis. No edema.  Discharge Instructions   Discharge Instructions     Call MD for:  difficulty breathing, headache or visual disturbances   Complete by: As directed    Call MD for:  extreme fatigue   Complete by: As directed    Call MD for:  hives   Complete by: As directed    Call MD for:  persistant dizziness or light-headedness   Complete by: As directed    Call MD for:  persistant nausea and vomiting   Complete by: As directed    Call MD for:  redness, tenderness, or signs of infection (pain, swelling, redness, odor or green/yellow discharge around incision site)   Complete by: As directed    Call MD for:  severe uncontrolled pain   Complete by: As directed    Call MD for:  temperature >100.4   Complete by: As directed    Diet - low sodium heart healthy   Complete by: As directed    Discharge instructions   Complete by: As directed    You were seen for an episode of unresponsiveness.  You had [name] reassuring workup.  Your EEG was negative for seizures.  Your UA wasn't consistent with [name] urine infection.  The head imaging we obtained did not show any acute abnormality.  The last time, they thought it was [name] syncopal episode and your blood pressure medicines were adjusted.  We've further adjusted your blood pressure medicines here, this will need to be followed outpatient.    We're planning to discharge you back home.  I'm going to hold some of your blood pressure medicines at discharge.  Don't take the spironolactone  or hydrochlorothiazide  for now until you follow  your blood pressure trends outpatient with your PCP.  You should have repeat labs with your PCP outpatient to follow your kidney function.  Return for new, recurrent, or worsening symptoms.  Please ask your PCP to request records from this hospitalization so they know what was done and what the next steps will be.   Increase activity slowly   Complete by: As directed       Allergies as of 04/18/2024   No Known Allergies      Medication List     PAUSE taking these medications    hydrochlorothiazide  25 MG tablet Wait to take this until your doctor or other care provider tells you to start again. Follow outpatient blood pressure and kidney function with your PCP before resuming this - you may consider holding this Commonly known as: HYDRODIURIL  TAKE 1 TABLET(25 MG) BY MOUTH DAILY   spironolactone  25 MG tablet Wait to take this until your doctor or other care provider tells you to start again. Follow outpatient blood pressure  and kidney function with your PCP before resuming this, you may consider holding this Commonly known as: ALDACTONE  Take 0.5 tablets (12.5 mg total) by mouth daily.       TAKE these medications    alfuzosin  10 MG 24 hr tablet Commonly known as: UROXATRAL  Take 1 tablet (10 mg total) by mouth every morning.   apixaban  5 MG Tabs tablet Commonly known as: Eliquis  Take 1 tablet (5 mg total) by mouth 2 (two) times daily.   bisacodyl 10 MG suppository Commonly known as: DULCOLAX Place 10 mg rectally daily as needed (constipation - no relief from MoM).   carvedilol  12.5 MG tablet Commonly known as: COREG  TAKE 1 TABLET(12.5 MG) BY MOUTH TWICE DAILY   cholecalciferol  25 MCG (1000 UNIT) tablet Commonly known as: VITAMIN D3 Take 1 tablet (1,000 Units total) by mouth daily.   cycloSPORINE  0.05 % ophthalmic emulsion Commonly known as: RESTASIS  Place 1 drop into both eyes 2 (two) times daily.   donepezil  10 MG tablet Commonly known as: ARICEPT  Take 10  mg by mouth daily.   Enema Enem Place 1 enema rectally daily as needed (constipation - no relief from bisacodyl suppository).   Entresto  24-26 MG Generic drug: sacubitril -valsartan  TAKE 1 TABLET BY MOUTH TWICE DAILY   fluticasone  50 MCG/ACT nasal spray Commonly known as: FLONASE  Place 2 sprays into both nostrils daily.   latanoprost  0.005 % ophthalmic solution Commonly known as: XALATAN  Place 1 drop into both eyes at bedtime.   levocetirizine 5 MG tablet Commonly known as: XYZAL  TAKE 1 TABLET(5 MG) BY MOUTH EVERY EVENING   Lubricant Eye Drops 0.4-0.3 % Soln Generic drug: Polyethyl Glycol-Propyl Glycol Place 1 drop into both eyes every 8 (eight) hours as needed (dry, irritated eyes).   melatonin 3 MG Tabs tablet Take 1 tablet (3 mg total) by mouth at bedtime as needed.   Milk of Magnesia 400 MG/5ML suspension Generic drug: magnesium hydroxide Take 30 mLs by mouth daily as needed (no BM in 3 days).   mirabegron  ER 50 MG Tb24 tablet Commonly known as: Myrbetriq  Take 1 tablet (50 mg total) by mouth every morning.   multivitamin with minerals Tabs tablet Take 1 tablet by mouth daily.   Omega-3 1000 MG Caps Take 1,000 mg by mouth daily.   rosuvastatin  20 MG tablet Commonly known as: Crestor  Take 1 tablet (20 mg total) by mouth daily. What changed: when to take this   traZODone 50 MG tablet Commonly known as: DESYREL Take 1 tablet (50 mg total) by mouth at bedtime as needed for sleep.       No Known Allergies    The results of significant diagnostics from this hospitalization (including imaging, microbiology, ancillary and laboratory) are listed below for reference.    Significant Diagnostic Studies: Overnight EEG with video Result Date: 04/16/2024 Shelton Arlin KIDD, MD     04/17/2024  7:00 AM Patient Name: Joseph [name] MRN: [id number] Epilepsy Attending: Arlin KIDD Shelton Referring Physician/Provider: Sallyann Normie CHRISTELLA, MD Duration: 04/16/2024 0350 to 04/17/2024 0350  Patient history: 78 y.o. male with PMH of dementia, afib (on Eliquis ), tachybradycardia, heart block (s/p biventricular pacemaker), HTN, HF, who presents after transient episode of right facial droop and unresponsiveness. EEG to evaluate for seizure Level of alertness: Awake, asleep AEDs during EEG study: None Technical aspects: This EEG study was done with scalp electrodes positioned according to the 10-20 International system of electrode placement. Electrical activity was reviewed with band pass filter of 1-70Hz , sensitivity of 7 uV/mm,  display speed of 9mm/sec with Marki Frede 60Hz  notched filter applied as appropriate. EEG data were recorded continuously and digitally stored.  Video monitoring was available and reviewed as appropriate. Description: The posterior dominant rhythm consists of 8-9 Hz activity of moderate voltage (25-35 uV) seen predominantly in posterior head regions, symmetric and reactive to eye opening and eye closing. Sleep was characterized by vertex waves, sleep spindles (12 to 14 Hz), maximal frontocentral region.  Hyperventilation and photic stimulation were not performed.   IMPRESSION: This study is within normal limits. No seizures or epileptiform discharges were seen throughout the recording. Terald Jump normal interictal EEG does not exclude nor support the diagnosis of epilepsy. Arlin MALVA Krebs   CT HEAD WO CONTRAST Result Date: 04/15/2024 CLINICAL DATA:  Altered mental status. EXAM: CT HEAD WITHOUT CONTRAST TECHNIQUE: Contiguous axial images were obtained from the base of the skull through the vertex without intravenous contrast. RADIATION DOSE REDUCTION: This exam was performed according to the departmental dose-optimization program which includes automated exposure control, adjustment of the mA and/or kV according to patient size and/or use of iterative reconstruction technique. COMPARISON:  April 06, 2024 FINDINGS: Brain: There is generalized cerebral atrophy with widening of the extra-axial  spaces and ventricular dilatation. There are areas of decreased attenuation within the white matter tracts of the supratentorial brain, consistent with microvascular disease changes. Chronic bilateral thalamic and basal ganglia lacunar infarcts are noted. Vascular: Marked severity bilateral cavernous carotid artery calcification is noted. Skull: Normal. Negative for fracture or focal lesion. Sinuses/Orbits: No acute finding. Other: None. IMPRESSION: 1. Generalized cerebral atrophy with chronic bilateral thalamic and basal ganglia lacunar infarcts. 2. No acute intracranial abnormality. Electronically Signed   By: Suzen Dials M.D.   On: 04/15/2024 16:56   DG Chest Port 1 View Result Date: 04/15/2024 CLINICAL DATA:  Syncope. EXAM: PORTABLE CHEST 1 VIEW COMPARISON:  02/03/2022. FINDINGS: The heart is enlarged and the mediastinal contour is within normal limits. Lung volumes are low. No consolidation, effusion, or pneumothorax is seen. Azyria Osmon multi lead cardiac device is present over the left chest. No acute osseous abnormality. IMPRESSION: No active disease. Electronically Signed   By: Leita Birmingham M.D.   On: 04/15/2024 16:20   VAS US  CAROTID (at [facility] and [facility] only) Result Date: 04/07/2024 Carotid Arterial Duplex Study Patient Name:  Joseph [name] [facility]  Date of Exam:   04/07/2024 Medical Rec #: [id number]          Accession #:    [id number] Date of Birth: 1945/12/06           Patient Gender: M Patient Age:   53 years Exam Location:  [facility] Procedure:      VAS US  CAROTID Referring Phys: SIGURD FAIRY --------------------------------------------------------------------------------  Indications: CVA. Performing Technologist: Joseph [name] RVT, RDMS  Examination Guidelines: Kushi Kun complete evaluation includes B-mode imaging, spectral Doppler, color Doppler, and power Doppler as needed of all accessible portions of each vessel. Bilateral testing is considered an integral part of Samyuktha Brau complete examination. Limited  examinations for reoccurring indications may be performed as noted.  Right Carotid Findings: +----------+--------+--------+--------+------------------+------------------+           PSV cm/sEDV cm/sStenosisPlaque DescriptionComments           +----------+--------+--------+--------+------------------+------------------+ CCA Prox  74      6                                 intimal thickening +----------+--------+--------+--------+------------------+------------------+  CCA Distal51      12                                intimal thickening +----------+--------+--------+--------+------------------+------------------+ ICA Prox  33      9                                 intimal thickening +----------+--------+--------+--------+------------------+------------------+ ICA Distal53      17                                                   +----------+--------+--------+--------+------------------+------------------+ ECA       69      6                                                    +----------+--------+--------+--------+------------------+------------------+ +----------+--------+-------+----------------+-------------------+           PSV cm/sEDV cmsDescribe        Arm Pressure (mmHG) +----------+--------+-------+----------------+-------------------+ [id number]            Multiphasic, WNL                    +----------+--------+-------+----------------+-------------------+ +---------+--------+--+--------+-+---------+ VertebralPSV cm/s25EDV cm/s7Antegrade +---------+--------+--+--------+-+---------+  Left Carotid Findings: +----------+--------+--------+--------+------------------+------------------+           PSV cm/sEDV cm/sStenosisPlaque DescriptionComments           +----------+--------+--------+--------+------------------+------------------+ CCA Prox  56      9               hypoechoic        intimal thickening  +----------+--------+--------+--------+------------------+------------------+ CCA Distal64      11                                intimal thickening +----------+--------+--------+--------+------------------+------------------+ ICA Prox  29      9                                                    +----------+--------+--------+--------+------------------+------------------+ ICA Distal61      21                                                   +----------+--------+--------+--------+------------------+------------------+ ECA       79      10                                                   +----------+--------+--------+--------+------------------+------------------+ +----------+--------+--------+----------------+-------------------+           PSV cm/sEDV cm/sDescribe        Arm Pressure (mmHG) +----------+--------+--------+----------------+-------------------+ [id number]  Multiphasic, WNL                    +----------+--------+--------+----------------+-------------------+ +---------+--------+--+--------+-+---------+ VertebralPSV cm/s26EDV cm/s7Antegrade +---------+--------+--+--------+-+---------+   Summary: Right Carotid: The extracranial vessels were near-normal with only minimal wall                thickening or plaque. Left Carotid: The extracranial vessels were near-normal with only minimal wall               thickening or plaque. Vertebrals:  Bilateral vertebral arteries demonstrate antegrade flow. Subclavians: Normal flow hemodynamics were seen in bilateral subclavian              arteries. *See table(s) above for measurements and observations.  Electronically signed by Eather Popp MD on 04/07/2024 at 5:00:08 PM.    Final    MR BRAIN WO CONTRAST Result Date: 04/07/2024 CLINICAL DATA:  Provided history: Neuro deficit, acute, stroke suspected. EXAM: MRI HEAD WITHOUT CONTRAST TECHNIQUE: Multiplanar, multiecho pulse sequences of the brain and  surrounding structures were obtained without intravenous contrast. COMPARISON:  Non-contrast head CT and CT angiogram head/neck 04/06/2024. Brain MRI 06/15/2023. FINDINGS: Intermittently motion degraded examination. Most notably, the sagittal T1 sequence is severely motion degraded and the coronal T2 sequence is moderately motion degraded. Within this limitation, findings are as follows. Brain: Generalized cerebral atrophy. Numerous chronic lacunar infarcts within the bilateral cerebral hemispheric white matter, within/about the bilateral deep Joseph nuclei, within the right aspect of the pons, within the right middle cerebellar peduncle and left cerebellar hemisphere. Several lacunar infarcts are new as compared to the MRI of 06/15/2023. For instance, bilateral thalamic lacunar infarcts have progressed in number. Multifocal T2 FLAIR hyperintense signal abnormality elsewhere within the cerebral white matter and pons, nonspecific but compatible with chronic small vessel ischemic disease. Numerous chronic microhemorrhages scattered within the supratentorial and infratentorial brain, similar to the prior MRI There is no acute infarct. No evidence of an intracranial mass. No extra-axial fluid collection. No midline shift. Vascular: Maintained flow voids within the proximal large arterial vessels. Skull and upper cervical spine: No focal worrisome marrow lesion. Sinuses/Orbits: No mass or acute finding within the imaged orbits. No significant paranasal sinus disease. IMPRESSION: 1. Intermittently motion degraded examination. 2. No evidence of an acute intracranial abnormality. The diffusion-weighted imaging is of good quality and there is no evidence of an acute infarct. 3. Parenchymal atrophy and advanced chronic small vessel ischemic disease (with numerous chronic lacunar infarcts and chronic microhemorrhages) as described. Electronically Signed   By: Rockey Childs D.O.   On: 04/07/2024 13:49   ECHOCARDIOGRAM  COMPLETE Result Date: 04/07/2024    ECHOCARDIOGRAM REPORT   Patient Name:   Joseph [name] Date of Exam: 04/07/2024 Medical Rec #:  [id number]         Height:       69.0 in Accession #:    [id number]        Weight:       228.8 lb Date of Birth:  05/23/1946          BSA:          2.188 m Patient Age:    78 years          BP:           158/82 mmHg Patient Gender: M                 HR:           68 bpm. Exam  Location:  Inpatient Procedure: 2D Echo, Cardiac Doppler, Color Doppler and Intracardiac            Opacification Agent (Both Spectral and Color Flow Doppler were            utilized during procedure). Indications:    Stroke  History:        Patient has prior history of Echocardiogram examinations, most                 recent 02/04/2024. Cardiomyopathy and CHF, Abnormal ECG and                 Pacemaker, Stroke, Arrythmias:RBBB, Bradycardia, Atrial                 Fibrillation and Atrial Flutter, Signs/Symptoms:Syncope; Risk                 Factors:Hypertension.  Sonographer:    Joseph [name] RDCS Referring Phys: FAIRY FRAMES  Sonographer Comments: Technically difficult study due to poor echo windows, Technically challenging study due to limited acoustic windows, suboptimal apical window and no subcostal window. Image acquisition challenging due to patient body habitus. Suboptimal study despite attempt to turn patient. IMPRESSIONS  1. Left ventricular ejection fraction, by estimation, is 40 to 45%. The left ventricle has mildly decreased function. The left ventricle demonstrates regional wall motion abnormalities (see scoring diagram/findings for description). There is mild left ventricular hypertrophy. Left ventricular diastolic parameters are consistent with Grade I diastolic dysfunction (impaired relaxation).  2. Right ventricular systolic function is normal. The right ventricular size is normal. Tricuspid regurgitation signal is inadequate for assessing PA pressure.  3. The mitral valve is normal in structure.  No evidence of mitral valve regurgitation. No evidence of mitral stenosis.  4. The aortic valve is normal in structure. Aortic valve regurgitation is not visualized. No aortic stenosis is present.  5. The inferior vena cava is normal in size with greater than 50% respiratory variability, suggesting right atrial pressure of 3 mmHg. Conclusion(s)/Recommendation(s): No intracardiac source of embolism detected on this transthoracic study. Consider Maximina Pirozzi transesophageal echocardiogram to exclude cardiac source of embolism if clinically indicated. FINDINGS  Left Ventricle: Left ventricular ejection fraction, by estimation, is 40 to 45%. The left ventricle has mildly decreased function. The left ventricle demonstrates regional wall motion abnormalities. The left ventricular internal cavity size was normal in size. There is mild left ventricular hypertrophy. Left ventricular diastolic parameters are consistent with Grade I diastolic dysfunction (impaired relaxation).  LV Wall Scoring: The posterior wall is akinetic. Right Ventricle: The right ventricular size is normal. No increase in right ventricular wall thickness. Right ventricular systolic function is normal. Tricuspid regurgitation signal is inadequate for assessing PA pressure. Left Atrium: Left atrial size was normal in size. Right Atrium: Right atrial size was normal in size. Pericardium: There is no evidence of pericardial effusion. Mitral Valve: The mitral valve is normal in structure. No evidence of mitral valve regurgitation. No evidence of mitral valve stenosis. Tricuspid Valve: The tricuspid valve is normal in structure. Tricuspid valve regurgitation is not demonstrated. No evidence of tricuspid stenosis. Aortic Valve: The aortic valve is normal in structure. Aortic valve regurgitation is not visualized. No aortic stenosis is present. Pulmonic Valve: The pulmonic valve was normal in structure. Pulmonic valve regurgitation is not visualized. No evidence of  pulmonic stenosis. Aorta: The aortic root is normal in size and structure. Venous: The inferior vena cava is normal in size with greater than 50% respiratory variability, suggesting right  atrial pressure of 3 mmHg. IAS/Shunts: No atrial level shunt detected by color flow Doppler.  LEFT VENTRICLE PLAX 2D LVIDd:         4.90 cm      Diastology LVIDs:         3.40 cm      LV e' medial:    5.66 cm/s LV PW:         1.30 cm      LV E/e' medial:  9.0 LV IVS:        1.30 cm      LV e' lateral:   7.07 cm/s LVOT diam:     2.10 cm      LV E/e' lateral: 7.2 LV SV:         53 LV SV Index:   24 LVOT Area:     3.46 cm  LV Volumes (MOD) LV vol d, MOD A2C: 68.2 ml LV vol d, MOD A4C: 153.0 ml LV vol s, MOD A2C: 41.3 ml LV vol s, MOD A4C: 78.3 ml LV SV MOD A2C:     26.9 ml LV SV MOD A4C:     153.0 ml LV SV MOD BP:      45.6 ml RIGHT VENTRICLE            IVC RV S prime:     7.07 cm/s  IVC diam: 1.80 cm TAPSE (M-mode): 1.3 cm LEFT ATRIUM             Index        RIGHT ATRIUM           Index LA diam:        3.50 cm 1.60 cm/m   RA Area:     12.20 cm LA Vol (A2C):   25.9 ml 11.84 ml/m  RA Volume:   27.60 ml  12.62 ml/m LA Vol (A4C):   40.0 ml 18.28 ml/m LA Biplane Vol: 33.2 ml 15.18 ml/m  AORTIC VALVE LVOT Vmax:   79.70 cm/s LVOT Vmean:  52.400 cm/s LVOT VTI:    0.152 m  AORTA Ao Root diam: 3.30 cm Ao Asc diam:  3.20 cm MITRAL VALVE MV Area (PHT): 3.27 cm    SHUNTS MV Decel Time: 232 msec    Systemic VTI:  0.15 m MV E velocity: 50.80 cm/s  Systemic Diam: 2.10 cm MV Sahana Boyland velocity: 74.40 cm/s MV E/Cleo Santucci ratio:  0.68 Oneil Parchment MD Electronically signed by Oneil Parchment MD Signature Date/Time: 04/07/2024/1:07:17 PM    Final    EEG adult Result Date: 04/07/2024 Shelton Arlin KIDD, MD     04/07/2024  5:00 PM Patient Name: Joseph [name] MRN: [id number] Epilepsy Attending: Arlin KIDD Shelton Referring Physician/Provider: Michaela Aisha SQUIBB, MD Date: 04/07/2024 Duration: 22.10 mins Patient history: 78 y.o. male with Nahiara Kretzschmar history of dementia who  presents with sudden mental status change in the setting of venipuncture. EEG to evaluate for seizure Level of alertness: Awake AEDs during EEG study: None Technical aspects: This EEG study was done with scalp electrodes positioned according to the 10-20 International system of electrode placement. Electrical activity was reviewed with band pass filter of 1-70Hz , sensitivity of 7 uV/mm, display speed of 35mm/sec with Koehn Salehi 60Hz  notched filter applied as appropriate. EEG data were recorded continuously and digitally stored.  Video monitoring was available and reviewed as appropriate. Description: The posterior dominant rhythm consists of 8 Hz activity of moderate voltage (25-35 uV) seen predominantly in posterior head regions, symmetric and reactive to eye  opening and eye closing. Hyperventilation and photic stimulation were not performed.   IMPRESSION: This study is within normal limits. No seizures or epileptiform discharges were seen throughout the recording. Tassie Pollett normal interictal EEG does not exclude the diagnosis of epilepsy. Priyanka MALVA Krebs   CT ANGIO HEAD NECK W WO CM (CODE STROKE) Result Date: 04/06/2024 CLINICAL DATA:  Neuro deficit, acute, stroke suspected. Right-sided weakness and facial droop. Altered mental status. EXAM: CT ANGIOGRAPHY HEAD AND NECK WITH AND WITHOUT CONTRAST TECHNIQUE: Multidetector CT imaging of the head and neck was performed using the standard protocol during bolus administration of intravenous contrast. Multiplanar CT image reconstructions and MIPs were obtained to evaluate the vascular anatomy. Carotid stenosis measurements (when applicable) are obtained utilizing NASCET criteria, using the distal internal carotid diameter as the denominator. RADIATION DOSE REDUCTION: This exam was performed according to the departmental dose-optimization program which includes automated exposure control, adjustment of the mA and/or kV according to patient size and/or use of iterative reconstruction  technique. CONTRAST:  75mL OMNIPAQUE  IOHEXOL  350 MG/ML SOLN COMPARISON:  None Available. FINDINGS: CTA NECK FINDINGS Aortic arch: Normal variant aortic arch branching pattern with common origin of the brachiocephalic and left common carotid arteries. Mild atherosclerotic calcification in the aortic arch and proximal subclavian arteries. Widely patent brachiocephalic and subclavian arteries. Right carotid system: Patent without evidence of stenosis or dissection. Left carotid system: Patent without evidence of stenosis or dissection. Small amount of nonstenotic calcified plaque in the proximal ICA. Vertebral arteries: Patent with the right being mildly dominant. Minimal atherosclerosis at the vertebral origins. No evidence of Ival Basquez significant stenosis or dissection. Skeleton: No acute osseous abnormality or suspicious lesion. Other neck: Asymmetrically enlarged and heterogeneous left thyroid  lobe with Constanza Mincy potential 4 cm nodule centered inferiorly with assessment limited by shoulder artifact. No evidence of cervical lymphadenopathy. Upper chest: Clear lung apices. Review of the MIP images confirms the above findings CTA HEAD FINDINGS Anterior circulation: The internal carotid arteries are patent from skull base to carotid termini with calcified plaque resulting in mild stenosis on the right at the level of the anterior genu. An aneurysm projecting posteriorly and inferiorly from the right supraclinoid ICA measures 6 x 3 mm. ACAs and MCAs are patent without evidence of Roshawn Lacina proximal branch occlusion or significant proximal stenosis. Posterior circulation: The intracranial vertebral arteries are widely patent to the basilar. Patent PICA, AICA, and SCA origins are visualized bilaterally. The basilar artery is widely patent. There are small left and diminutive or absent/occluded right posterior communicating arteries. Both PCAs are patent without evidence of Soua Caltagirone significant proximal stenosis. No aneurysm is identified. Venous  sinuses: As permitted by contrast timing, patent. Anatomic variants: None. Review of the MIP images confirms the above findings The finding of no large vessel occlusion was communicated to Dr. Michaela at 12:14 pm on 04/06/2024 by text page via the [facility] messaging system. IMPRESSION: 1. No large vessel occlusion. 2. Mild intracranial right ICA stenosis. 3. 6 mm right supraclinoid ICA aneurysm. 4. Widely patent cervical carotid and vertebral arteries. 5. Possible 4 cm left thyroid  nodule. Recommend non-emergent thyroid  ultrasound. Reference: J Am Coll Radiol. 2015 Feb;12(2): 143-50 6.  Aortic Atherosclerosis (ICD10-I70.0). Electronically Signed   By: Dasie Hamburg M.D.   On: 04/06/2024 12:25   CT HEAD CODE STROKE WO CONTRAST Result Date: 04/06/2024 EXAM: CT HEAD WITHOUT CONTRAST 04/06/2024 11:26:06 AM TECHNIQUE: CT of the head was performed without the administration of intravenous contrast. Automated exposure control, iterative reconstruction, and/or weight based adjustment of  the mA/kV was utilized to reduce the radiation dose to as low as reasonably achievable. COMPARISON: CT of the head dated 04/21/2001. CLINICAL HISTORY: Neuro deficit, acute, stroke suspected. Right weakness. AMS. Right Facial droop. [id number]. FINDINGS: BRAIN AND VENTRICLES: No acute hemorrhage. No evidence of acute infarct. No hydrocephalus. No extra-axial collection. No mass effect or midline shift. There is age-related atrophy and marked periventricular and deep cerebral white matter disease. There are chronic lacunar infarcts within the Thalami bilaterally and within the right paracentral pons. [place] stroke program early CT (ASPECT) score: Ganglionic (caudate, internal capsule, Lentiform Nucleus, insula, M1-M3): 7. Supraganglionic (M4-M6): 3. Total: 10. ORBITS: No acute abnormality. SINUSES: No acute abnormality. SOFT TISSUES AND SKULL: No acute soft tissue abnormality. No skull fracture. IMPRESSION: 1. No acute intracranial  abnormality. 2. 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 3. 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 Electronically signed by: Evalene Coho MD 04/06/2024 11:33 AM EDT RP Workstation: [id number]    Microbiology: Recent Results (from the past 240 hours)  Resp panel by RT-PCR (RSV, Flu Mindie Rawdon&B, Covid) Anterior Nasal Swab     Status: None   Collection Time: 04/15/24  3:34 PM   Specimen: Anterior Nasal Swab  Result Value Ref Range Status   SARS Coronavirus 2 by RT PCR NEGATIVE NEGATIVE Final   Influenza Flavius Repsher by PCR NEGATIVE NEGATIVE Final   Influenza B by PCR NEGATIVE NEGATIVE Final    Comment: (NOTE) The Xpert Xpress SARS-CoV-2/FLU/RSV plus assay is intended as an aid in the diagnosis of influenza from Nasopharyngeal swab specimens and should not be used as Raven Harmes sole basis for treatment. Nasal washings and aspirates are unacceptable for Xpert Xpress SARS-CoV-2/FLU/RSV testing.  Fact Sheet for Patients: BloggerCourse.com  Fact Sheet for Healthcare Providers: SeriousBroker.it  This test is not yet approved or cleared by the [place]  FDA and has been authorized for detection and/or diagnosis of SARS-CoV-2 by FDA under an Emergency Use Authorization (EUA). This EUA will remain in effect (meaning this test can be used) for the duration of the COVID-19 declaration under Section 564(b)(1) of the Act, 21 U.S.C. section 360bbb-3(b)(1), unless the authorization is terminated or revoked.     Resp Syncytial Virus by PCR NEGATIVE NEGATIVE Final    Comment: (NOTE) Fact Sheet for Patients: BloggerCourse.com  Fact Sheet for Healthcare Providers: SeriousBroker.it  This test is not yet approved or cleared by the [place]  FDA and has been authorized for detection and/or diagnosis of SARS-CoV-2 by FDA  under an Emergency Use Authorization (EUA). This EUA will remain in effect (meaning this test can be used) for the duration of the COVID-19 declaration under Section 564(b)(1) of the Act, 21 U.S.C. section 360bbb-3(b)(1), unless the authorization is terminated or revoked.  Performed at [facility], [address], [place], [place] [address]      Labs: Basic Metabolic Panel: Recent Labs  Lab 04/12/24 [phone] 04/15/24 1530 04/16/24 0418 04/17/24 0655 04/18/24 0241  NA 138 138 138 137 138  K 4.5 4.8 4.0 4.3 4.2  CL 106 106 103 104 104  CO2 22 23 25 24 26   GLUCOSE 106* 116* 115* 89 101*  BUN 27* 24* 21 23 25*  CREATININE 1.65* 1.63* 1.54* 1.55* 1.49*  CALCIUM  8.6* 8.7* 8.7* 8.9 8.7*   Liver Function Tests: Recent Labs  Lab 04/15/24 1530  AST 27  ALT 15  ALKPHOS 76  BILITOT 0.9  PROT 6.3*  ALBUMIN 3.3*  No results for input(s): LIPASE, AMYLASE in the last 168 hours. No results for input(s): AMMONIA in the last 168 hours. CBC: Recent Labs  Lab 04/15/24 1530 04/16/24 0418 04/17/24 0655 04/18/24 0241  WBC 5.3 6.4 5.5 6.3  NEUTROABS 3.6  --   --   --   HGB 12.2* 11.1* 12.2* 11.4*  HCT 40.4 36.3* 39.5 36.7*  MCV 72.4* 71.5* 70.4* 70.3*  PLT 225 207 211 200   Cardiac Enzymes: No results for input(s): CKTOTAL, CKMB, CKMBINDEX, TROPONINI in the last 168 hours. BNP: BNP (last 3 results) No results for input(s): BNP in the last 8760 hours.  ProBNP (last 3 results) No results for input(s): PROBNP in the last 8760 hours.  CBG: Recent Labs  Lab 04/15/24 1459  GLUCAP 175*       Signed:  Meliton Monte MD.  [facility] 04/18/2024, 10:13 AM

## 2024-04-19 DIAGNOSIS — G9340 Encephalopathy, unspecified: Secondary | ICD-10-CM | POA: Diagnosis not present

## 2024-04-19 DIAGNOSIS — E785 Hyperlipidemia, unspecified: Secondary | ICD-10-CM | POA: Diagnosis not present

## 2024-04-19 NOTE — Discharge Summary (Signed)
 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 male with medical history significant for hypertension, hyperlipidemia, atrial fibrillation on Eliquis , CVA, dementia, heart block with pacemaker, chronic HFpEF, and recent admission for suspected syncopal episode who now returns after an episode of unresponsiveness. Patient had been admitted to the hospital on 04/06/2024 after a transient loss of consciousness with right facial droop and AKI superimposed on CKD 3A.  He was seen by neurology during that hospitalization, had no acute findings on MRI or routine EEG, and had borderline positive orthostatic vitals.  Clonidine  and Cardura  were discontinued and he was discharged to an SNF on 04/13/2024. Per report of the patient's wife at the bedside, the patient has continued to be much more fatigued and generally weak than he was prior to the recent admission. He then had an episode after eating lunch where he appeared to be sleeping but his wife was unable to wake him. She was concerned that he appeared to be gagging  or trying to throw up at 1 point during the episode. EMS was called and the patient eventually began to respond after 15 to 20 minutes but remained somnolent and confused. He was seen by neurology here, overnight EEG was without seizures or epileptiform activity.  BP liberalized, holding spironolactone  and hydrochlorothiazide  at discharge.    Stable for discharge 10/7.   Hospital Course:  Assessment and Plan:  1. Episode of unresponsiveness and right facial droop   - He had a similar episode recently that was felt to possibly be vasovagal; neurology notes that the right facial droop was documented by outpatient neurologist a year ago  - liberalized BP (holding thiazide, spironolactone  at discharge) given concern for positive orthostatics at last admission and episode of unresponsiveness - There were no acute findings on head CT or CTA head & neck  - repeat orthostatics (negative), therapy evals - EEG without seizures or epileptiform activities   2. PAF  - Continue Eliquis  and Coreg      3. CKD 3A  - SCr is 1.63 on admission; baseline may be closer to 1.3  - 1.5 today on discharge - Hold Aldactone  and hydrochlorothiazide  for now, follow outpatient (we've resumed entresto )   4. Chronic HFmrEF  - coreg , entresto  - thiazide and spironolactone  on hold  - echo 03/2024 with EF 40-45%, diastolic dysfunction - no AS   # HTN - continuing coreg , entresto   - spironolactone  and hydrochlorothiazide  on hold - follow outpatient   5. Dementia; insomnia  - Continue Aricept  and trazodone, use delirium precautions     # BPH - alfuzosin     # Dyslipidemia - crestor    #  Overactive Bladder myrbetriq    Procedures:  EEG IMPRESSION: This study is within normal limits. No seizures or epileptiform discharges were seen throughout the recording.   A normal interictal EEG does not exclude nor support the diagnosis of epilepsy.  Consultations: neurology  Discharge Exam: Vitals:   04/19/24 0744  04/19/24 1140  BP: (!) 171/96 (!) 151/91  Pulse: 86 78  Resp: 20   Temp: 98.7 F (37.1 C) 98.8 F (37.1 C)  SpO2: 95% 98%   No new complaints Wife at bedside - discussed d/c planning  General: No acute distress. Cardiovascular: RRR Lungs: unlabored Neurological: Alert. Moves all extremities 4 with equal strength. Cranial nerves II through XII grossly intact. Extremities: No clubbing or cyanosis. No edema.  Discharge Instructions   Discharge Instructions     Call MD for:  difficulty breathing, headache or visual disturbances   Complete by: As directed    Call MD for:  extreme fatigue   Complete by: As directed    Call MD for:  hives   Complete by: As directed    Call MD for:  persistant dizziness or light-headedness   Complete by: As directed    Call MD for:  persistant nausea and vomiting   Complete by: As directed    Call MD for:  redness, tenderness, or signs of infection (pain, swelling, redness, odor or green/yellow discharge around incision site)   Complete by: As directed    Call MD for:  severe uncontrolled pain   Complete by: As directed    Call MD for:  temperature >100.4   Complete by: As directed    Diet - low sodium heart healthy   Complete by: As directed    Discharge instructions   Complete by: As directed    You were seen for an episode of unresponsiveness.  You had a reassuring workup.  Your EEG was negative for seizures.  Your UA wasn't consistent with a urine infection.  The head imaging we obtained did not show any acute abnormality.  The last time, they thought it was a syncopal episode and your blood pressure medicines were adjusted.  We've further adjusted your blood pressure medicines here, this will need to be followed outpatient.    We're planning to discharge you back home.  I'm going to hold some of your blood pressure medicines at discharge.  Don't take the spironolactone  or hydrochlorothiazide  for now until you follow your blood pressure  trends outpatient with your PCP.  You should have repeat labs with your PCP outpatient to follow your kidney function.  You should have a thyroid  ultrasound outpatient.   Return for new, recurrent, or worsening symptoms.  Please ask your PCP to request records from this hospitalization so they know what was done and what the next steps will be.   Increase activity slowly   Complete by: As directed       Allergies as of 04/19/2024   No Known Allergies      Medication List     PAUSE taking these medications    hydrochlorothiazide  25 MG tablet Wait to take this until your doctor or other care provider tells you to start again. Follow outpatient blood pressure and kidney function with your PCP before resuming this - you may consider holding this Commonly known as: HYDRODIURIL  TAKE 1 TABLET(25 MG) BY MOUTH DAILY   spironolactone  25 MG tablet Wait to take this until your doctor or other care provider tells you to start again. Follow outpatient blood  pressure and kidney function with your PCP before resuming this, you may consider holding this Commonly known as: ALDACTONE  Take 0.5 tablets (12.5 mg total) by mouth daily.       TAKE these medications    alfuzosin  10 MG 24 hr tablet Commonly known as: UROXATRAL  Take 1 tablet (10 mg total) by mouth every morning.   apixaban  5 MG Tabs tablet Commonly known as: Eliquis  Take 1 tablet (5 mg total) by mouth 2 (two) times daily.   bisacodyl 10 MG suppository Commonly known as: DULCOLAX Place 10 mg rectally daily as needed (constipation - no relief from MoM).   carvedilol  12.5 MG tablet Commonly known as: COREG  TAKE 1 TABLET(12.5 MG) BY MOUTH TWICE DAILY   cholecalciferol  25 MCG (1000 UNIT) tablet Commonly known as: VITAMIN D3 Take 1 tablet (1,000 Units total) by mouth daily.   cycloSPORINE  0.05 % ophthalmic emulsion Commonly known as: RESTASIS  Place 1 drop into both eyes 2 (two) times daily.   donepezil  10 MG  tablet Commonly known as: ARICEPT  Take 10 mg by mouth daily.   Enema Enem Place 1 enema rectally daily as needed (constipation - no relief from bisacodyl suppository).   Entresto  24-26 MG Generic drug: sacubitril -valsartan  TAKE 1 TABLET BY MOUTH TWICE DAILY   fluticasone  50 MCG/ACT nasal spray Commonly known as: FLONASE  Place 2 sprays into both nostrils daily.   latanoprost  0.005 % ophthalmic solution Commonly known as: XALATAN  Place 1 drop into both eyes at bedtime.   levocetirizine 5 MG tablet Commonly known as: XYZAL  TAKE 1 TABLET(5 MG) BY MOUTH EVERY EVENING   Lubricant Eye Drops 0.4-0.3 % Soln Generic drug: Polyethyl Glycol-Propyl Glycol Place 1 drop into both eyes every 8 (eight) hours as needed (dry, irritated eyes).   melatonin 3 MG Tabs tablet Take 1 tablet (3 mg total) by mouth at bedtime as needed.   Milk of Magnesia 400 MG/5ML suspension Generic drug: magnesium hydroxide Take 30 mLs by mouth daily as needed (no BM in 3 days).   mirabegron  ER 50 MG Tb24 tablet Commonly known as: Myrbetriq  Take 1 tablet (50 mg total) by mouth every morning.   multivitamin with minerals Tabs tablet Take 1 tablet by mouth daily.   Omega-3 1000 MG Caps Take 1,000 mg by mouth daily.   rosuvastatin  20 MG tablet Commonly known as: Crestor  Take 1 tablet (20 mg total) by mouth daily. What changed: when to take this   traZODone 50 MG tablet Commonly known as: DESYREL Take 1 tablet (50 mg total) by mouth at bedtime as needed for sleep.       No Known Allergies  Contact information for after-discharge care     Destination     [facility] [place], [place] .   Service: Skilled Nursing Contact information: [address] [place]  [phone]                      The results of significant diagnostics from this hospitalization (including imaging, microbiology, ancillary and laboratory) are listed below for reference.     Significant Diagnostic Studies: Overnight EEG with video Result Date: 04/16/2024 Shelton Arlin KIDD, MD     04/17/2024  7:00 AM Patient Name: JULIAN MEDINA MRN: [id number] Epilepsy Attending: Arlin KIDD Shelton Referring Physician/Provider: Sallyann Normie CHRISTELLA, MD Duration: 04/16/2024 0350 to 04/17/2024 0350 Patient history: 78 y.o. male with PMH of dementia, afib (on Eliquis ), tachybradycardia, heart block (s/p biventricular pacemaker), HTN, HF, who presents after transient episode  of right facial droop and unresponsiveness. EEG to evaluate for seizure Level of alertness: Awake, asleep AEDs during EEG study: None Technical aspects: This EEG study was done with scalp electrodes positioned according to the 10-20 International system of electrode placement. Electrical activity was reviewed with band pass filter of 1-70Hz , sensitivity of 7 uV/mm, display speed of 87mm/sec with a 60Hz  notched filter applied as appropriate. EEG data were recorded continuously and digitally stored.  Video monitoring was available and reviewed as appropriate. Description: The posterior dominant rhythm consists of 8-9 Hz activity of moderate voltage (25-35 uV) seen predominantly in posterior head regions, symmetric and reactive to eye opening and eye closing. Sleep was characterized by vertex waves, sleep spindles (12 to 14 Hz), maximal frontocentral region.  Hyperventilation and photic stimulation were not performed.   IMPRESSION: This study is within normal limits. No seizures or epileptiform discharges were seen throughout the recording. A normal interictal EEG does not exclude nor support the diagnosis of epilepsy. Arlin MALVA Krebs   CT HEAD WO CONTRAST Result Date: 04/15/2024 CLINICAL DATA:  Altered mental status. EXAM: CT HEAD WITHOUT CONTRAST TECHNIQUE: Contiguous axial images were obtained from the base of the skull through the vertex without intravenous contrast. RADIATION DOSE REDUCTION: This exam was performed according to the  departmental dose-optimization program which includes automated exposure control, adjustment of the mA and/or kV according to patient size and/or use of iterative reconstruction technique. COMPARISON:  April 06, 2024 FINDINGS: Brain: There is generalized cerebral atrophy with widening of the extra-axial spaces and ventricular dilatation. There are areas of decreased attenuation within the white matter tracts of the supratentorial brain, consistent with microvascular disease changes. Chronic bilateral thalamic and basal ganglia lacunar infarcts are noted. Vascular: Marked severity bilateral cavernous carotid artery calcification is noted. Skull: Normal. Negative for fracture or focal lesion. Sinuses/Orbits: No acute finding. Other: None. IMPRESSION: 1. Generalized cerebral atrophy with chronic bilateral thalamic and basal ganglia lacunar infarcts. 2. No acute intracranial abnormality. Electronically Signed   By: Suzen Dials M.D.   On: 04/15/2024 16:56   DG Chest Port 1 View Result Date: 04/15/2024 CLINICAL DATA:  Syncope. EXAM: PORTABLE CHEST 1 VIEW COMPARISON:  02/03/2022. FINDINGS: The heart is enlarged and the mediastinal contour is within normal limits. Lung volumes are low. No consolidation, effusion, or pneumothorax is seen. A multi lead cardiac device is present over the left chest. No acute osseous abnormality. IMPRESSION: No active disease. Electronically Signed   By: Leita Birmingham M.D.   On: 04/15/2024 16:20   VAS US  CAROTID (at [facility] and [facility] only) Result Date: 04/07/2024 Carotid Arterial Duplex Study Patient Name:  TRUITT CRUEY [facility]  Date of Exam:   04/07/2024 Medical Rec #: [id number]          Accession #:    [id number] Date of Birth: 18-Jan-1946           Patient Gender: M Patient Age:   47 years Exam Location:  [facility] Procedure:      VAS US  CAROTID Referring Phys: SIGURD FAIRY --------------------------------------------------------------------------------  Indications: CVA.  Performing Technologist: Ezzie Potters RVT, RDMS  Examination Guidelines: A complete evaluation includes B-mode imaging, spectral Doppler, color Doppler, and power Doppler as needed of all accessible portions of each vessel. Bilateral testing is considered an integral part of a complete examination. Limited examinations for reoccurring indications may be performed as noted.  Right Carotid Findings: +----------+--------+--------+--------+------------------+------------------+           PSV cm/sEDV cm/sStenosisPlaque DescriptionComments           +----------+--------+--------+--------+------------------+------------------+  CCA Prox  74      6                                 intimal thickening +----------+--------+--------+--------+------------------+------------------+ CCA Distal51      12                                intimal thickening +----------+--------+--------+--------+------------------+------------------+ ICA Prox  33      9                                 intimal thickening +----------+--------+--------+--------+------------------+------------------+ ICA Distal53      17                                                   +----------+--------+--------+--------+------------------+------------------+ ECA       69      6                                                    +----------+--------+--------+--------+------------------+------------------+ +----------+--------+-------+----------------+-------------------+           PSV cm/sEDV cmsDescribe        Arm Pressure (mmHG) +----------+--------+-------+----------------+-------------------+ [id number]            Multiphasic, WNL                    +----------+--------+-------+----------------+-------------------+ +---------+--------+--+--------+-+---------+ VertebralPSV cm/s25EDV cm/s7Antegrade +---------+--------+--+--------+-+---------+  Left Carotid Findings:  +----------+--------+--------+--------+------------------+------------------+           PSV cm/sEDV cm/sStenosisPlaque DescriptionComments           +----------+--------+--------+--------+------------------+------------------+ CCA Prox  56      9               hypoechoic        intimal thickening +----------+--------+--------+--------+------------------+------------------+ CCA Distal64      11                                intimal thickening +----------+--------+--------+--------+------------------+------------------+ ICA Prox  29      9                                                    +----------+--------+--------+--------+------------------+------------------+ ICA Distal61      21                                                   +----------+--------+--------+--------+------------------+------------------+ ECA       79      10                                                   +----------+--------+--------+--------+------------------+------------------+ +----------+--------+--------+----------------+-------------------+  PSV cm/sEDV cm/sDescribe        Arm Pressure (mmHG) +----------+--------+--------+----------------+-------------------+ [id number]             Multiphasic, WNL                    +----------+--------+--------+----------------+-------------------+ +---------+--------+--+--------+-+---------+ VertebralPSV cm/s26EDV cm/s7Antegrade +---------+--------+--+--------+-+---------+   Summary: Right Carotid: The extracranial vessels were near-normal with only minimal wall                thickening or plaque. Left Carotid: The extracranial vessels were near-normal with only minimal wall               thickening or plaque. Vertebrals:  Bilateral vertebral arteries demonstrate antegrade flow. Subclavians: Normal flow hemodynamics were seen in bilateral subclavian              arteries. *See table(s) above for measurements and  observations.  Electronically signed by Eather Popp MD on 04/07/2024 at 5:00:08 PM.    Final    MR BRAIN WO CONTRAST Result Date: 04/07/2024 CLINICAL DATA:  Provided history: Neuro deficit, acute, stroke suspected. EXAM: MRI HEAD WITHOUT CONTRAST TECHNIQUE: Multiplanar, multiecho pulse sequences of the brain and surrounding structures were obtained without intravenous contrast. COMPARISON:  Non-contrast head CT and CT angiogram head/neck 04/06/2024. Brain MRI 06/15/2023. FINDINGS: Intermittently motion degraded examination. Most notably, the sagittal T1 sequence is severely motion degraded and the coronal T2 sequence is moderately motion degraded. Within this limitation, findings are as follows. Brain: Generalized cerebral atrophy. Numerous chronic lacunar infarcts within the bilateral cerebral hemispheric white matter, within/about the bilateral deep gray nuclei, within the right aspect of the pons, within the right middle cerebellar peduncle and left cerebellar hemisphere. Several lacunar infarcts are new as compared to the MRI of 06/15/2023. For instance, bilateral thalamic lacunar infarcts have progressed in number. Multifocal T2 FLAIR hyperintense signal abnormality elsewhere within the cerebral white matter and pons, nonspecific but compatible with chronic small vessel ischemic disease. Numerous chronic microhemorrhages scattered within the supratentorial and infratentorial brain, similar to the prior MRI There is no acute infarct. No evidence of an intracranial mass. No extra-axial fluid collection. No midline shift. Vascular: Maintained flow voids within the proximal large arterial vessels. Skull and upper cervical spine: No focal worrisome marrow lesion. Sinuses/Orbits: No mass or acute finding within the imaged orbits. No significant paranasal sinus disease. IMPRESSION: 1. Intermittently motion degraded examination. 2. No evidence of an acute intracranial abnormality. The diffusion-weighted imaging is  of good quality and there is no evidence of an acute infarct. 3. Parenchymal atrophy and advanced chronic small vessel ischemic disease (with numerous chronic lacunar infarcts and chronic microhemorrhages) as described. Electronically Signed   By: Rockey Childs D.O.   On: 04/07/2024 13:49   ECHOCARDIOGRAM COMPLETE Result Date: 04/07/2024    ECHOCARDIOGRAM REPORT   Patient Name:   DANIELLE LENTO Date of Exam: 04/07/2024 Medical Rec #:  [id number]         Height:       69.0 in Accession #:    [id number]        Weight:       228.8 lb Date of Birth:  02-Aug-1945          BSA:          2.188 m Patient Age:    78 years          BP:           158/82 mmHg Patient Gender: M  HR:           68 bpm. Exam Location:  Inpatient Procedure: 2D Echo, Cardiac Doppler, Color Doppler and Intracardiac            Opacification Agent (Both Spectral and Color Flow Doppler were            utilized during procedure). Indications:    Stroke  History:        Patient has prior history of Echocardiogram examinations, most                 recent 02/04/2024. Cardiomyopathy and CHF, Abnormal ECG and                 Pacemaker, Stroke, Arrythmias:RBBB, Bradycardia, Atrial                 Fibrillation and Atrial Flutter, Signs/Symptoms:Syncope; Risk                 Factors:Hypertension.  Sonographer:    Ellouise Mose RDCS Referring Phys: FAIRY FRAMES  Sonographer Comments: Technically difficult study due to poor echo windows, Technically challenging study due to limited acoustic windows, suboptimal apical window and no subcostal window. Image acquisition challenging due to patient body habitus. Suboptimal study despite attempt to turn patient. IMPRESSIONS  1. Left ventricular ejection fraction, by estimation, is 40 to 45%. The left ventricle has mildly decreased function. The left ventricle demonstrates regional wall motion abnormalities (see scoring diagram/findings for description). There is mild left ventricular hypertrophy. Left  ventricular diastolic parameters are consistent with Grade I diastolic dysfunction (impaired relaxation).  2. Right ventricular systolic function is normal. The right ventricular size is normal. Tricuspid regurgitation signal is inadequate for assessing PA pressure.  3. The mitral valve is normal in structure. No evidence of mitral valve regurgitation. No evidence of mitral stenosis.  4. The aortic valve is normal in structure. Aortic valve regurgitation is not visualized. No aortic stenosis is present.  5. The inferior vena cava is normal in size with greater than 50% respiratory variability, suggesting right atrial pressure of 3 mmHg. Conclusion(s)/Recommendation(s): No intracardiac source of embolism detected on this transthoracic study. Consider a transesophageal echocardiogram to exclude cardiac source of embolism if clinically indicated. FINDINGS  Left Ventricle: Left ventricular ejection fraction, by estimation, is 40 to 45%. The left ventricle has mildly decreased function. The left ventricle demonstrates regional wall motion abnormalities. The left ventricular internal cavity size was normal in size. There is mild left ventricular hypertrophy. Left ventricular diastolic parameters are consistent with Grade I diastolic dysfunction (impaired relaxation).  LV Wall Scoring: The posterior wall is akinetic. Right Ventricle: The right ventricular size is normal. No increase in right ventricular wall thickness. Right ventricular systolic function is normal. Tricuspid regurgitation signal is inadequate for assessing PA pressure. Left Atrium: Left atrial size was normal in size. Right Atrium: Right atrial size was normal in size. Pericardium: There is no evidence of pericardial effusion. Mitral Valve: The mitral valve is normal in structure. No evidence of mitral valve regurgitation. No evidence of mitral valve stenosis. Tricuspid Valve: The tricuspid valve is normal in structure. Tricuspid valve regurgitation is not  demonstrated. No evidence of tricuspid stenosis. Aortic Valve: The aortic valve is normal in structure. Aortic valve regurgitation is not visualized. No aortic stenosis is present. Pulmonic Valve: The pulmonic valve was normal in structure. Pulmonic valve regurgitation is not visualized. No evidence of pulmonic stenosis. Aorta: The aortic root is normal in size and structure. Venous: The inferior  vena cava is normal in size with greater than 50% respiratory variability, suggesting right atrial pressure of 3 mmHg. IAS/Shunts: No atrial level shunt detected by color flow Doppler.  LEFT VENTRICLE PLAX 2D LVIDd:         4.90 cm      Diastology LVIDs:         3.40 cm      LV e' medial:    5.66 cm/s LV PW:         1.30 cm      LV E/e' medial:  9.0 LV IVS:        1.30 cm      LV e' lateral:   7.07 cm/s LVOT diam:     2.10 cm      LV E/e' lateral: 7.2 LV SV:         53 LV SV Index:   24 LVOT Area:     3.46 cm  LV Volumes (MOD) LV vol d, MOD A2C: 68.2 ml LV vol d, MOD A4C: 153.0 ml LV vol s, MOD A2C: 41.3 ml LV vol s, MOD A4C: 78.3 ml LV SV MOD A2C:     26.9 ml LV SV MOD A4C:     153.0 ml LV SV MOD BP:      45.6 ml RIGHT VENTRICLE            IVC RV S prime:     7.07 cm/s  IVC diam: 1.80 cm TAPSE (M-mode): 1.3 cm LEFT ATRIUM             Index        RIGHT ATRIUM           Index LA diam:        3.50 cm 1.60 cm/m   RA Area:     12.20 cm LA Vol (A2C):   25.9 ml 11.84 ml/m  RA Volume:   27.60 ml  12.62 ml/m LA Vol (A4C):   40.0 ml 18.28 ml/m LA Biplane Vol: 33.2 ml 15.18 ml/m  AORTIC VALVE LVOT Vmax:   79.70 cm/s LVOT Vmean:  52.400 cm/s LVOT VTI:    0.152 m  AORTA Ao Root diam: 3.30 cm Ao Asc diam:  3.20 cm MITRAL VALVE MV Area (PHT): 3.27 cm    SHUNTS MV Decel Time: 232 msec    Systemic VTI:  0.15 m MV E velocity: 50.80 cm/s  Systemic Diam: 2.10 cm MV A velocity: 74.40 cm/s MV E/A ratio:  0.68 Oneil Parchment MD Electronically signed by Oneil Parchment MD Signature Date/Time: 04/07/2024/1:07:17 PM    Final    EEG adult Result  Date: 04/07/2024 Shelton Arlin KIDD, MD     04/07/2024  5:00 PM Patient Name: JARRAH BABICH MRN: [id number] Epilepsy Attending: Arlin KIDD Shelton Referring Physician/Provider: Michaela Aisha SQUIBB, MD Date: 04/07/2024 Duration: 22.10 mins Patient history: 78 y.o. male with a history of dementia who presents with sudden mental status change in the setting of venipuncture. EEG to evaluate for seizure Level of alertness: Awake AEDs during EEG study: None Technical aspects: This EEG study was done with scalp electrodes positioned according to the 10-20 International system of electrode placement. Electrical activity was reviewed with band pass filter of 1-70Hz , sensitivity of 7 uV/mm, display speed of 71mm/sec with a 60Hz  notched filter applied as appropriate. EEG data were recorded continuously and digitally stored.  Video monitoring was available and reviewed as appropriate. Description: The posterior dominant rhythm consists of 8 Hz activity of moderate  voltage (25-35 uV) seen predominantly in posterior head regions, symmetric and reactive to eye opening and eye closing. Hyperventilation and photic stimulation were not performed.   IMPRESSION: This study is within normal limits. No seizures or epileptiform discharges were seen throughout the recording. A normal interictal EEG does not exclude the diagnosis of epilepsy. Priyanka MALVA Krebs   CT ANGIO HEAD NECK W WO CM (CODE STROKE) Result Date: 04/06/2024 CLINICAL DATA:  Neuro deficit, acute, stroke suspected. Right-sided weakness and facial droop. Altered mental status. EXAM: CT ANGIOGRAPHY HEAD AND NECK WITH AND WITHOUT CONTRAST TECHNIQUE: Multidetector CT imaging of the head and neck was performed using the standard protocol during bolus administration of intravenous contrast. Multiplanar CT image reconstructions and MIPs were obtained to evaluate the vascular anatomy. Carotid stenosis measurements (when applicable) are obtained utilizing NASCET criteria, using  the distal internal carotid diameter as the denominator. RADIATION DOSE REDUCTION: This exam was performed according to the departmental dose-optimization program which includes automated exposure control, adjustment of the mA and/or kV according to patient size and/or use of iterative reconstruction technique. CONTRAST:  75mL OMNIPAQUE  IOHEXOL  350 MG/ML SOLN COMPARISON:  None Available. FINDINGS: CTA NECK FINDINGS Aortic arch: Normal variant aortic arch branching pattern with common origin of the brachiocephalic and left common carotid arteries. Mild atherosclerotic calcification in the aortic arch and proximal subclavian arteries. Widely patent brachiocephalic and subclavian arteries. Right carotid system: Patent without evidence of stenosis or dissection. Left carotid system: Patent without evidence of stenosis or dissection. Small amount of nonstenotic calcified plaque in the proximal ICA. Vertebral arteries: Patent with the right being mildly dominant. Minimal atherosclerosis at the vertebral origins. No evidence of a significant stenosis or dissection. Skeleton: No acute osseous abnormality or suspicious lesion. Other neck: Asymmetrically enlarged and heterogeneous left thyroid  lobe with a potential 4 cm nodule centered inferiorly with assessment limited by shoulder artifact. No evidence of cervical lymphadenopathy. Upper chest: Clear lung apices. Review of the MIP images confirms the above findings CTA HEAD FINDINGS Anterior circulation: The internal carotid arteries are patent from skull base to carotid termini with calcified plaque resulting in mild stenosis on the right at the level of the anterior genu. An aneurysm projecting posteriorly and inferiorly from the right supraclinoid ICA measures 6 x 3 mm. ACAs and MCAs are patent without evidence of a proximal branch occlusion or significant proximal stenosis. Posterior circulation: The intracranial vertebral arteries are widely patent to the basilar. Patent  PICA, AICA, and SCA origins are visualized bilaterally. The basilar artery is widely patent. There are small left and diminutive or absent/occluded right posterior communicating arteries. Both PCAs are patent without evidence of a significant proximal stenosis. No aneurysm is identified. Venous sinuses: As permitted by contrast timing, patent. Anatomic variants: None. Review of the MIP images confirms the above findings The finding of no large vessel occlusion was communicated to Dr. Michaela at 12:14 pm on 04/06/2024 by text page via the [facility] messaging system. IMPRESSION: 1. No large vessel occlusion. 2. Mild intracranial right ICA stenosis. 3. 6 mm right supraclinoid ICA aneurysm. 4. Widely patent cervical carotid and vertebral arteries. 5. Possible 4 cm left thyroid  nodule. Recommend non-emergent thyroid  ultrasound. Reference: J Am Coll Radiol. 2015 Feb;12(2): 143-50 6.  Aortic Atherosclerosis (ICD10-I70.0). Electronically Signed   By: Dasie Hamburg M.D.   On: 04/06/2024 12:25   CT HEAD CODE STROKE WO CONTRAST Result Date: 04/06/2024 EXAM: CT HEAD WITHOUT CONTRAST 04/06/2024 11:26:06 AM TECHNIQUE: CT of the head was performed without the  administration of intravenous contrast. Automated exposure control, iterative reconstruction, and/or weight based adjustment of the mA/kV was utilized to reduce the radiation dose to as low as reasonably achievable. COMPARISON: CT of the head dated 04/21/2001. CLINICAL HISTORY: Neuro deficit, acute, stroke suspected. Right weakness. AMS. Right Facial droop. [id number]:10. FINDINGS: BRAIN AND VENTRICLES: No acute hemorrhage. No evidence of acute infarct. No hydrocephalus. No extra-axial collection. No mass effect or midline shift. There is age-related atrophy and marked periventricular and deep cerebral white matter disease. There are chronic lacunar infarcts within the Thalami bilaterally and within the right paracentral pons. [place] stroke program early CT (ASPECT) score:  Ganglionic (caudate, internal capsule, Lentiform Nucleus, insula, M1-M3): 7. Supraganglionic (M4-M6): 3. Total: 10. ORBITS: No acute abnormality. SINUSES: No acute abnormality. SOFT TISSUES AND SKULL: No acute soft tissue abnormality. No skull fracture. IMPRESSION: 1. No acute intracranial abnormality. 2. 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 3. 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 Electronically signed by: Evalene Coho MD 04/06/2024 11:33 AM EDT RP Workstation: [id number]    Microbiology: Recent Results (from the past 240 hours)  Resp panel by RT-PCR (RSV, Flu A&B, Covid) Anterior Nasal Swab     Status: None   Collection Time: 04/15/24  3:34 PM   Specimen: Anterior Nasal Swab  Result Value Ref Range Status   SARS Coronavirus 2 by RT PCR NEGATIVE NEGATIVE Final   Influenza A by PCR NEGATIVE NEGATIVE Final   Influenza B by PCR NEGATIVE NEGATIVE Final    Comment: (NOTE) The Xpert Xpress SARS-CoV-2/FLU/RSV plus assay is intended as an aid in the diagnosis of influenza from Nasopharyngeal swab specimens and should not be used as a sole basis for treatment. Nasal washings and aspirates are unacceptable for Xpert Xpress SARS-CoV-2/FLU/RSV testing.  Fact Sheet for Patients: BloggerCourse.com  Fact Sheet for Healthcare Providers: SeriousBroker.it  This test is not yet approved or cleared by the [place]  FDA and has been authorized for detection and/or diagnosis of SARS-CoV-2 by FDA under an Emergency Use Authorization (EUA). This EUA will remain in effect (meaning this test can be used) for the duration of the COVID-19 declaration under Section 564(b)(1) of the Act, 21 U.S.C. section 360bbb-3(b)(1), unless the authorization is terminated or revoked.     Resp Syncytial Virus by PCR NEGATIVE NEGATIVE Final    Comment: (NOTE) Fact Sheet  for Patients: BloggerCourse.com  Fact Sheet for Healthcare Providers: SeriousBroker.it  This test is not yet approved or cleared by the [place]  FDA and has been authorized for detection and/or diagnosis of SARS-CoV-2 by FDA under an Emergency Use Authorization (EUA). This EUA will remain in effect (meaning this test can be used) for the duration of the COVID-19 declaration under Section 564(b)(1) of the Act, 21 U.S.C. section 360bbb-3(b)(1), unless the authorization is terminated or revoked.  Performed at [facility], [address], [place], [place] [address]      Labs: Basic Metabolic Panel: Recent Labs  Lab 04/15/24 1530 04/16/24 0418 04/17/24 0655 04/18/24 0241  NA 138 138 137 138  K 4.8 4.0 4.3 4.2  CL 106 103 104 104  CO2 23 25 24 26   GLUCOSE 116* 115* 89 101*  BUN 24* 21 23 25*  CREATININE 1.63* 1.54* 1.55* 1.49*  CALCIUM  8.7* 8.7* 8.9 8.7*   Liver Function Tests: Recent Labs  Lab 04/15/24 1530  AST 27  ALT 15  ALKPHOS 76  BILITOT 0.9  PROT 6.3*  ALBUMIN 3.3*   No results for input(s): LIPASE, AMYLASE in the last 168 hours. No results for input(s): AMMONIA in the last 168 hours. CBC: Recent Labs  Lab 04/15/24 1530 04/16/24 0418 04/17/24 0655 04/18/24 0241  WBC 5.3 6.4 5.5 6.3  NEUTROABS 3.6  --   --   --   HGB 12.2* 11.1* 12.2* 11.4*  HCT 40.4 36.3* 39.5 36.7*  MCV 72.4* 71.5* 70.4* 70.3*  PLT 225 207 211 200   Cardiac Enzymes: No results for input(s): CKTOTAL, CKMB, CKMBINDEX, TROPONINI in the last 168 hours. BNP: BNP (last 3 results) No results for input(s): BNP in the last 8760 hours.  ProBNP (last 3 results) No results for input(s): PROBNP in the last 8760 hours.  CBG: Recent Labs  Lab 04/15/24 1459  GLUCAP 175*    Time spent: 35 minutes  Signed:  Nilda Fendt MD.  [facility] 04/19/2024, 12:48 PM

## 2024-04-19 NOTE — Care Management Important Message (Signed)
 Important Message  Patient Details  Name: Joseph [name] MRN: [id number] Date of Birth: 15-Oct-1945   Important Message Given:  Yes - Medicare IM     Jon Cruel 04/19/2024, 4:47 PM

## 2024-04-19 NOTE — Progress Notes (Signed)
 Remote PPM Transmission

## 2024-04-19 NOTE — TOC Transition Note (Signed)
 Transition of Care [facility]) - Discharge Note   Patient Details  Name: Joseph [name] MRN: [id number] Date of Birth: 1946-01-01  [facility]) CM/SW Contact:  Montie LOISE Louder, LCSW Phone Number: 04/19/2024, 2:01 PM   Clinical Narrative:     Patient will Discharge to: [facility]  Discharge Date: 04/19/2024 Family Notified: spouse  Transport Ab:[facility]  Per MD patient is ready for discharge. RN, patient, and facility notified of discharge. Discharge Summary sent to facility. RN given number for [phone]. Ambulance transport requested for patient.   Clinical Social Worker signing off.  Montie Louder, MSW, LCSW Clinical Social Worker     Final next level of care: Skilled Nursing Facility Barriers to Discharge: Barriers Resolved   Patient Goals and CMS Choice            Discharge Placement              Patient [facility] bed at: [facility] Patient to be transferred to facility by: [facility] Name of family member notified: unable to reach spouse Patient and family notified of of transfer: 04/19/24  Discharge Plan and Services Additional resources added to the After Visit Summary for   In-house Referral: Clinical Social Work                                   Social Drivers of Health (SDOH) Interventions SDOH Screenings   Food Insecurity: No Food Insecurity (04/16/2024)  Housing: Low Risk  (04/16/2024)  Transportation Needs: No Transportation Needs (04/16/2024)  Utilities: Not At Risk (04/16/2024)  Alcohol  Screen: Low Risk  (04/12/2023)  Depression (PHQ2-9): Low Risk  (03/16/2024)  Financial Resource Strain: Low Risk  (04/12/2023)  Physical Activity: Inactive (04/12/2023)  Social Connections: Socially Integrated (04/16/2024)  Stress: No Stress Concern Present (04/12/2023)  Tobacco Use: Medium Risk (04/16/2024)  Health Literacy: Adequate Health Literacy (04/12/2023)     Readmission Risk Interventions     No data to display

## 2024-04-19 NOTE — TOC Progression Note (Signed)
 Transition of Care [facility]) - Progression Note    Patient Details  Name: Joseph [name] MRN: [id number] Date of Birth: 1945-12-30  Transition of Care [facility]) CM/SW Contact  Montie LOISE Louder, [place] Phone Number: 04/19/2024, 12:35 PM  Clinical Narrative:     Insurance approved for [place]  # [id number]  Reference # [id number]   04/18/2024 through 04/20/2024  Montie Louder, MSW, LCSW Clinical Social Worker    Expected Discharge Plan: Skilled Nursing Facility Barriers to Discharge: Barriers Resolved               Expected Discharge Plan and Services In-house Referral: Clinical Social Work     Living arrangements for the past 2 months: Skilled Nursing Facility                                       Social Drivers of Health (SDOH) Interventions SDOH Screenings   Food Insecurity: No Food Insecurity (04/16/2024)  Housing: Low Risk  (04/16/2024)  Transportation Needs: No Transportation Needs (04/16/2024)  Utilities: Not At Risk (04/16/2024)  Alcohol  Screen: Low Risk  (04/12/2023)  Depression (PHQ2-9): Low Risk  (03/16/2024)  Financial Resource Strain: Low Risk  (04/12/2023)  Physical Activity: Inactive (04/12/2023)  Social Connections: Socially Integrated (04/16/2024)  Stress: No Stress Concern Present (04/12/2023)  Tobacco Use: Medium Risk (04/16/2024)  Health Literacy: Adequate Health Literacy (04/12/2023)    Readmission Risk Interventions     No data to display

## 2024-04-19 NOTE — Plan of Care (Signed)
  Problem: Education: Goal: Knowledge of General Education information will improve Description: Including pain rating scale, medication(s)/side effects and non-pharmacologic comfort measures Outcome: Progressing   Problem: Health Behavior/Discharge Planning: Goal: Ability to manage health-related needs will improve Outcome: Progressing   Problem: Clinical Measurements: Goal: Ability to maintain clinical measurements within normal limits will improve Outcome: Progressing Goal: Diagnostic test results will improve Outcome: Progressing Goal: Respiratory complications will improve Outcome: Progressing Goal: Cardiovascular complication will be avoided Outcome: Progressing   Problem: Activity: Goal: Risk for activity intolerance will decrease Outcome: Progressing   Problem: Nutrition: Goal: Adequate nutrition will be maintained Outcome: Progressing   Problem: Coping: Goal: Level of anxiety will decrease Outcome: Progressing   Problem: Elimination: Goal: Will not experience complications related to bowel motility Outcome: Progressing Goal: Will not experience complications related to urinary retention Outcome: Progressing   Problem: Pain Managment: Goal: General experience of comfort will improve and/or be controlled Outcome: Progressing   Problem: Safety: Goal: Ability to remain free from injury will improve Outcome: Progressing   Problem: Skin Integrity: Goal: Risk for impaired skin integrity will decrease Outcome: Progressing   Problem: Education: Goal: Knowledge of condition and prescribed therapy will improve Outcome: Progressing   Problem: Cardiac: Goal: Will achieve and/or maintain adequate cardiac output Outcome: Progressing   Problem: Physical Regulation: Goal: Complications related to the disease process, condition or treatment will be avoided or minimized Outcome: Progressing

## 2024-04-19 NOTE — Plan of Care (Signed)
  Problem: Education: Goal: Knowledge of General Education information will improve Description: Including pain rating scale, medication(s)/side effects and non-pharmacologic comfort measures 04/19/2024 1400 by Christianne Fredie CROME, RN Outcome: Completed/Met 04/19/2024 1029 by Christianne Fredie CROME, RN Outcome: Progressing   Problem: Health Behavior/Discharge Planning: Goal: Ability to manage health-related needs will improve 04/19/2024 1400 by Christianne Fredie L, RN Outcome: Completed/Met 04/19/2024 1029 by Christianne Fredie CROME, RN Outcome: Progressing   Problem: Clinical Measurements: Goal: Ability to maintain clinical measurements within normal limits will improve 04/19/2024 1400 by Christianne Fredie CROME, RN Outcome: Completed/Met 04/19/2024 1029 by Christianne Fredie CROME, RN Outcome: Progressing Goal: Diagnostic test results will improve 04/19/2024 1400 by Christianne Fredie CROME, RN Outcome: Completed/Met 04/19/2024 1029 by Christianne Fredie CROME, RN Outcome: Progressing Goal: Respiratory complications will improve 04/19/2024 1400 by Christianne Fredie CROME, RN Outcome: Completed/Met 04/19/2024 1029 by Christianne Fredie CROME, RN Outcome: Progressing Goal: Cardiovascular complication will be avoided 04/19/2024 1400 by Christianne Fredie CROME, RN Outcome: Completed/Met 04/19/2024 1029 by Christianne Fredie CROME, RN Outcome: Progressing   Problem: Activity: Goal: Risk for activity intolerance will decrease 04/19/2024 1400 by Christianne Fredie L, RN Outcome: Completed/Met 04/19/2024 1029 by Christianne Fredie CROME, RN Outcome: Progressing   Problem: Nutrition: Goal: Adequate nutrition will be maintained 04/19/2024 1400 by Christianne Fredie CROME, RN Outcome: Completed/Met 04/19/2024 1029 by Christianne Fredie CROME, RN Outcome: Progressing   Problem: Coping: Goal: Level of anxiety will decrease 04/19/2024 1400 by Christianne Fredie L, RN Outcome: Completed/Met 04/19/2024 1029 by Christianne Fredie CROME, RN Outcome: Progressing   Problem: Elimination: Goal: Will not experience complications related to bowel  motility 04/19/2024 1400 by Christianne Fredie CROME, RN Outcome: Completed/Met 04/19/2024 1029 by Christianne Fredie CROME, RN Outcome: Progressing Goal: Will not experience complications related to urinary retention 04/19/2024 1400 by Christianne Fredie CROME, RN Outcome: Completed/Met 04/19/2024 1029 by Christianne Fredie CROME, RN Outcome: Progressing   Problem: Pain Managment: Goal: General experience of comfort will improve and/or be controlled 04/19/2024 1400 by Christianne Fredie L, RN Outcome: Completed/Met 04/19/2024 1029 by Christianne Fredie CROME, RN Outcome: Progressing   Problem: Safety: Goal: Ability to remain free from injury will improve 04/19/2024 1400 by Christianne Fredie L, RN Outcome: Completed/Met 04/19/2024 1029 by Christianne Fredie CROME, RN Outcome: Progressing   Problem: Skin Integrity: Goal: Risk for impaired skin integrity will decrease 04/19/2024 1400 by Christianne Fredie CROME, RN Outcome: Completed/Met 04/19/2024 1029 by Christianne Fredie CROME, RN Outcome: Progressing   Problem: Education: Goal: Knowledge of condition and prescribed therapy will improve 04/19/2024 1400 by Christianne Fredie L, RN Outcome: Completed/Met 04/19/2024 1029 by Christianne Fredie CROME, RN Outcome: Progressing   Problem: Cardiac: Goal: Will achieve and/or maintain adequate cardiac output 04/19/2024 1400 by Christianne Fredie CROME, RN Outcome: Completed/Met 04/19/2024 1029 by Christianne Fredie CROME, RN Outcome: Progressing   Problem: Physical Regulation: Goal: Complications related to the disease process, condition or treatment will be avoided or minimized 04/19/2024 1400 by Christianne Fredie CROME, RN Outcome: Completed/Met 04/19/2024 1029 by Christianne Fredie CROME, RN Outcome: Progressing

## 2024-04-19 NOTE — Progress Notes (Signed)
 Physical Therapy Treatment Patient Details Name: Joseph [name] MRN: [id number] DOB: 07/27/1945 Today's Date: 04/19/2024   History of Present Illness 78 y/o M presenting to ED on 10/4 from [facility] with episode of unresponsiveness/AMS, R weakness, CT head negative for acute abnormality. Admitted for acute encephalopathy. Recent admission 9/25-10/2 for syncopal episode with d/c to SNF.    PMH includes HTN, HLD, A fib on Eliquis , CVA, dementia, heart block with pacemaker, HFpEF    PT Comments  Pt continues with flat affect and delayed response time however was participatory in PT session, oriented to date and place with cues, and increased ambulation tolerance with RW this date. Pt remains appropriate to return to SNF upon d/c to continue with inpatient therapy regimen to progress back towards indep. Acute PT to cont to follow.    If plan is discharge home, recommend the following: A little help with walking and/or transfers;A little help with bathing/dressing/bathroom;Assistance with cooking/housework;Help with stairs or ramp for entrance;Supervision due to cognitive status;Assist for transportation   Can travel by private vehicle     No  Equipment Recommendations  Rolling walker (2 wheels);BSC/3in1;Wheelchair (measurements PT);Wheelchair cushion (measurements PT)    Recommendations for Other Services       Precautions / Restrictions Precautions Precautions: Fall Restrictions Weight Bearing Restrictions Per Provider Order: No     Mobility  Bed Mobility Overal bed mobility: Needs Assistance Bed Mobility: Supine to Sit     Supine to sit: Mod assist, HOB elevated, Used rails     General bed mobility comments: assist for trunk elevation, LE progression to EOB, and scooting towards EOB with bed pad.    Transfers Overall transfer level: Needs assistance Equipment used: Rolling walker (2 wheels) Transfers: Sit to/from Stand Sit to Stand: Min assist   Step pivot transfers:  Min assist      Lateral/Scoot Transfers: Min assist General transfer comment: assist for rise and steady, cues for hand placement. stand x3 , x1 from EOB and x2 from recliner.    Ambulation/Gait Ambulation/Gait assistance: Min assist, +2 physical assistance Gait Distance (Feet): 60 Feet Assistive device: Rolling walker (2 wheels) Gait Pattern/deviations: Step-through pattern, Decreased stride length, Trunk flexed Gait velocity: decr     General Gait Details: 2nd person for chair follow, continued pushing of RW by PT to keep fluidity of gait pattern, without tactile cues pt does not push walker on own, pt had to stop due to having urinary incontinence   Stairs             Wheelchair Mobility     Tilt Bed    Modified Rankin (Stroke Patients Only)       Balance Overall balance assessment: Needs assistance Sitting-balance support: No upper extremity supported, Feet supported Sitting balance-Leahy Scale: Fair     Standing balance support: Bilateral upper extremity supported, Reliant on assistive device for balance Standing balance-Leahy Scale: Poor Standing balance comment: pt stood in walker x 1 min with bilat UE dependency for pericare and change of depends                            Communication Communication Communication: Impaired Factors Affecting Communication: Difficulty expressing self;Reduced clarity of speech  Cognition Arousal: Alert Behavior During Therapy: Flat affect   PT - Cognitive impairments: Orientation, Sequencing, Problem solving, Safety/Judgement                       PT -  Cognition Comments: with increased time and looking at calendar in room pt able to state date and that he was at [facility]. Pt continues with delayed response time, difficulty sequencing requiring continued multimodal cues to perform tasks Following commands: Impaired Following commands impaired: Follows one step commands with increased time     Cueing Cueing Techniques: Verbal cues, Gestural cues, Tactile cues  Exercises      General Comments General comments (skin integrity, edema, etc.): BP steady at 156/68      Pertinent Vitals/Pain Pain Assessment Pain Assessment: No/denies pain    Home Living                          Prior Function            PT Goals (current goals can now be found in the care plan section) Acute Rehab PT Goals Patient Stated Goal: did not state PT Goal Formulation: With patient/family Time For Goal Achievement: 04/30/24 Potential to Achieve Goals: Fair Progress towards PT goals: Progressing toward goals    Frequency    Min 2X/week      PT Plan      Co-evaluation              AM-PAC PT 6 Clicks Mobility   Outcome Measure  Help needed turning from your back to your side while in a flat bed without using bedrails?: A Lot Help needed moving from lying on your back to sitting on the side of a flat bed without using bedrails?: A Lot Help needed moving to and from a bed to a chair (including a wheelchair)?: A Lot Help needed standing up from a chair using your arms (e.g., wheelchair or bedside chair)?: A Lot Help needed to walk in hospital room?: A Lot Help needed climbing 3-5 steps with a railing? : Total 6 Click Score: 11    End of Session Equipment Utilized During Treatment: Gait belt Activity Tolerance: Patient tolerated treatment well Patient left: with call bell/phone within reach;in chair;with chair alarm set;with nursing/sitter in room Nurse Communication: Mobility status PT Visit Diagnosis: Unsteadiness on feet (R26.81);Muscle weakness (generalized) (M62.81)     Time: 9068-9043 PT Time Calculation (min) (ACUTE ONLY): 25 min  Charges:    $Gait Training: 8-22 mins $Therapeutic Activity: 8-22 mins PT General Charges $$ ACUTE PT VISIT: 1 Visit                     Norene Ames, PT, DPT Acute Rehabilitation Services Secure chat preferred Office  #: [phone]    Norene CHRISTELLA Ames 04/19/2024, 10:30 AM

## 2024-04-20 DIAGNOSIS — F0390 Unspecified dementia without behavioral disturbance: Secondary | ICD-10-CM | POA: Diagnosis not present

## 2024-04-20 DIAGNOSIS — E785 Hyperlipidemia, unspecified: Secondary | ICD-10-CM | POA: Diagnosis not present

## 2024-04-20 DIAGNOSIS — I509 Heart failure, unspecified: Secondary | ICD-10-CM | POA: Diagnosis not present

## 2024-04-20 DIAGNOSIS — I4891 Unspecified atrial fibrillation: Secondary | ICD-10-CM | POA: Diagnosis not present

## 2024-04-21 DIAGNOSIS — M79642 Pain in left hand: Secondary | ICD-10-CM | POA: Diagnosis not present

## 2024-04-21 DIAGNOSIS — M7989 Other specified soft tissue disorders: Secondary | ICD-10-CM | POA: Diagnosis not present

## 2024-04-21 DIAGNOSIS — M79645 Pain in left finger(s): Secondary | ICD-10-CM | POA: Diagnosis not present

## 2024-04-24 DIAGNOSIS — M79642 Pain in left hand: Secondary | ICD-10-CM | POA: Diagnosis not present

## 2024-04-24 DIAGNOSIS — M7989 Other specified soft tissue disorders: Secondary | ICD-10-CM | POA: Diagnosis not present

## 2024-04-25 ENCOUNTER — Ambulatory Visit: Payer: Medicare PPO

## 2024-04-25 DIAGNOSIS — I4891 Unspecified atrial fibrillation: Secondary | ICD-10-CM | POA: Diagnosis not present

## 2024-04-25 DIAGNOSIS — G459 Transient cerebral ischemic attack, unspecified: Secondary | ICD-10-CM | POA: Diagnosis not present

## 2024-04-25 DIAGNOSIS — R55 Syncope and collapse: Secondary | ICD-10-CM | POA: Diagnosis not present

## 2024-04-25 DIAGNOSIS — I509 Heart failure, unspecified: Secondary | ICD-10-CM | POA: Diagnosis not present

## 2024-04-25 DIAGNOSIS — F0390 Unspecified dementia without behavioral disturbance: Secondary | ICD-10-CM | POA: Diagnosis not present

## 2024-04-25 DIAGNOSIS — M6281 Muscle weakness (generalized): Secondary | ICD-10-CM | POA: Diagnosis not present

## 2024-04-25 DIAGNOSIS — E875 Hyperkalemia: Secondary | ICD-10-CM | POA: Diagnosis not present

## 2024-04-26 DIAGNOSIS — R2689 Other abnormalities of gait and mobility: Secondary | ICD-10-CM | POA: Diagnosis not present

## 2024-04-26 DIAGNOSIS — I4891 Unspecified atrial fibrillation: Secondary | ICD-10-CM | POA: Diagnosis not present

## 2024-04-26 DIAGNOSIS — I428 Other cardiomyopathies: Secondary | ICD-10-CM | POA: Diagnosis not present

## 2024-04-26 DIAGNOSIS — I509 Heart failure, unspecified: Secondary | ICD-10-CM | POA: Diagnosis not present

## 2024-04-26 DIAGNOSIS — R41841 Cognitive communication deficit: Secondary | ICD-10-CM | POA: Diagnosis not present

## 2024-04-26 DIAGNOSIS — M6281 Muscle weakness (generalized): Secondary | ICD-10-CM | POA: Diagnosis not present

## 2024-04-26 DIAGNOSIS — R1310 Dysphagia, unspecified: Secondary | ICD-10-CM | POA: Diagnosis not present

## 2024-04-26 DIAGNOSIS — Z8673 Personal history of transient ischemic attack (TIA), and cerebral infarction without residual deficits: Secondary | ICD-10-CM | POA: Diagnosis not present

## 2024-04-26 DIAGNOSIS — F0390 Unspecified dementia without behavioral disturbance: Secondary | ICD-10-CM | POA: Diagnosis not present

## 2024-04-26 DIAGNOSIS — I5032 Chronic diastolic (congestive) heart failure: Secondary | ICD-10-CM | POA: Diagnosis not present

## 2024-05-01 DIAGNOSIS — I10 Essential (primary) hypertension: Secondary | ICD-10-CM | POA: Diagnosis not present

## 2024-05-03 DIAGNOSIS — M6281 Muscle weakness (generalized): Secondary | ICD-10-CM | POA: Diagnosis not present

## 2024-05-03 DIAGNOSIS — R1310 Dysphagia, unspecified: Secondary | ICD-10-CM | POA: Diagnosis not present

## 2024-05-03 DIAGNOSIS — I5032 Chronic diastolic (congestive) heart failure: Secondary | ICD-10-CM | POA: Diagnosis not present

## 2024-05-03 DIAGNOSIS — I4891 Unspecified atrial fibrillation: Secondary | ICD-10-CM | POA: Diagnosis not present

## 2024-05-03 DIAGNOSIS — R2689 Other abnormalities of gait and mobility: Secondary | ICD-10-CM | POA: Diagnosis not present

## 2024-05-03 DIAGNOSIS — F0390 Unspecified dementia without behavioral disturbance: Secondary | ICD-10-CM | POA: Diagnosis not present

## 2024-05-03 DIAGNOSIS — Z8673 Personal history of transient ischemic attack (TIA), and cerebral infarction without residual deficits: Secondary | ICD-10-CM | POA: Diagnosis not present

## 2024-05-03 DIAGNOSIS — R41841 Cognitive communication deficit: Secondary | ICD-10-CM | POA: Diagnosis not present

## 2024-05-03 DIAGNOSIS — I428 Other cardiomyopathies: Secondary | ICD-10-CM | POA: Diagnosis not present

## 2024-05-04 ENCOUNTER — Ambulatory Visit: Attending: Internal Medicine

## 2024-05-04 DIAGNOSIS — I509 Heart failure, unspecified: Secondary | ICD-10-CM | POA: Diagnosis not present

## 2024-05-04 DIAGNOSIS — I428 Other cardiomyopathies: Secondary | ICD-10-CM

## 2024-05-04 DIAGNOSIS — F0390 Unspecified dementia without behavioral disturbance: Secondary | ICD-10-CM | POA: Diagnosis not present

## 2024-05-04 DIAGNOSIS — I10 Essential (primary) hypertension: Secondary | ICD-10-CM | POA: Diagnosis not present

## 2024-05-04 DIAGNOSIS — I4891 Unspecified atrial fibrillation: Secondary | ICD-10-CM | POA: Diagnosis not present

## 2024-05-05 ENCOUNTER — Ambulatory Visit: Payer: Self-pay | Admitting: Internal Medicine

## 2024-05-05 ENCOUNTER — Telehealth: Payer: Self-pay

## 2024-05-05 LAB — CUP PACEART REMOTE DEVICE CHECK
Battery Remaining Longevity: 56 mo
Battery Remaining Percentage: 68 %
Battery Voltage: 2.99 V
Brady Statistic AP VP Percent: 55 %
Brady Statistic AP VS Percent: 1 %
Brady Statistic AS VP Percent: 44 %
Brady Statistic AS VS Percent: 1 %
Brady Statistic RA Percent Paced: 53 %
Date Time Interrogation Session: 20251023164925
Implantable Lead Connection Status: 753985
Implantable Lead Connection Status: 753985
Implantable Lead Connection Status: 753985
Implantable Lead Implant Date: 20230717
Implantable Lead Implant Date: 20230717
Implantable Lead Implant Date: 20230717
Implantable Lead Location: 753858
Implantable Lead Location: 753859
Implantable Lead Location: 753860
Implantable Pulse Generator Implant Date: 20230717
Lead Channel Impedance Value: 360 Ohm
Lead Channel Impedance Value: 450 Ohm
Lead Channel Impedance Value: 510 Ohm
Lead Channel Pacing Threshold Amplitude: 0.625 V
Lead Channel Pacing Threshold Amplitude: 0.75 V
Lead Channel Pacing Threshold Amplitude: 1.25 V
Lead Channel Pacing Threshold Pulse Width: 0.5 ms
Lead Channel Pacing Threshold Pulse Width: 0.5 ms
Lead Channel Pacing Threshold Pulse Width: 0.5 ms
Lead Channel Sensing Intrinsic Amplitude: 12 mV
Lead Channel Sensing Intrinsic Amplitude: 3.6 mV
Lead Channel Setting Pacing Amplitude: 1.625
Lead Channel Setting Pacing Amplitude: 2.25 V
Lead Channel Setting Pacing Amplitude: 2.5 V
Lead Channel Setting Pacing Pulse Width: 0.5 ms
Lead Channel Setting Pacing Pulse Width: 0.5 ms
Lead Channel Setting Sensing Sensitivity: 2 mV
Pulse Gen Model: 3562
Pulse Gen Serial Number: 8094736

## 2024-05-05 NOTE — Progress Notes (Signed)
 Remote PPM Transmission

## 2024-05-05 NOTE — Transitions of Care (Post Inpatient/ED Visit) (Signed)
   05/05/2024  Name: Joseph [name] MRN: [id number] DOB: 1946/03/15  Today's TOC FU Call Status: Today's TOC FU Call Status:: Unsuccessful Call (1st Attempt) Unsuccessful Call (1st Attempt) Date: 05/05/24  Attempted to reach the patient regarding the most recent Inpatient/ED visit.  Follow Up Plan: Additional outreach attempts will be made to reach the patient to complete the Transitions of Care (Post Inpatient/ED visit) call.   Signature Julian Lemmings, LPN [facility] Nurse Health Advisor Direct Dial [phone]

## 2024-05-08 NOTE — Transitions of Care (Post Inpatient/ED Visit) (Signed)
 05/08/2024  Name: Joseph [name] MRN: [id number] DOB: 08-17-1945  Today's TOC FU Call Status: Today's TOC FU Call Status:: Successful TOC FU Call Completed Unsuccessful Call (1st Attempt) Date: 05/05/24 [facility] FU Call Complete Date: 05/08/24 Patient's Name and Date of Birth confirmed.  Transition Care Management Follow-up Telephone Call Date of Discharge: 05/04/24 Discharge Facility: Other [facility]) Name of Other ([facility]) [facility] Facility[facility] Type of Discharge: Inpatient Admission Primary Inpatient Discharge Diagnosis:: weakness How have you been since you were released from the hospital?: Better Any questions or concerns?: No  Items Reviewed: Did you receive and understand the discharge instructions provided?: Yes Medications obtained,verified, and reconciled?: Yes (Medications Reviewed) Any new allergies since your discharge?: No Dietary orders reviewed?: Yes Do you have support at home?: Yes People in Home [RPT]: spouse  Medications Reviewed Today: Medications Reviewed Today     Reviewed by Emmitt Pan, LPN (Licensed Practical Nurse) on 05/08/24 at 1452  Med List Status: <None>   Medication Order Taking? Sig Documenting Provider Last Dose Status Informant  alfuzosin  (UROXATRAL ) 10 MG 24 hr tablet [id number] Yes Take 1 tablet (10 mg total) by mouth every morning. McKenzie, Belvie CROME, MD  Active Nursing Home Medication Administration Guide (MAG)  apixaban  (ELIQUIS ) 5 MG TABS tablet [id number] Yes Take 1 tablet (5 mg total) by mouth 2 (two) times daily. Waddell Danelle ORN, MD  Active Nursing Home Medication Administration Guide (MAG)  bisacodyl (DULCOLAX) 10 MG suppository [id number] Yes Place 10 mg rectally daily as needed (constipation - no relief from MoM). [provider]  Active Nursing Home Medication Administration Guide (MAG)  carvedilol  (COREG ) 12.5 MG tablet [id number] Yes TAKE 1 TABLET(12.5 MG) BY MOUTH TWICE DAILY Burnard Debby LABOR, MD   Active Nursing Home Medication Administration Guide (MAG)  cholecalciferol  (VITAMIN D3) 25 MCG (1000 UNIT) tablet [id number] Yes Take 1 tablet (1,000 Units total) by mouth daily. Del Wilhelmena Lloyd Sola, FNP  Active Nursing Home Medication Administration Guide (MAG)  cycloSPORINE  (RESTASIS ) 0.05 % ophthalmic emulsion [id number] Yes Place 1 drop into both eyes 2 (two) times daily. Del Orbe Polanco, Iliana, FNP  Active Nursing Home Medication Administration Guide (MAG)  donepezil  (ARICEPT ) 10 MG tablet [id number] Yes Take 10 mg by mouth daily. [provider]  Active Nursing Home Medication Administration Guide (MAG)  fluticasone  (FLONASE ) 50 MCG/ACT nasal spray [id number] Yes Place 2 sprays into both nostrils daily. Soldatova, Liuba, MD  Active Nursing Home Medication Administration Guide (MAG)  hydrochlorothiazide  (HYDRODIURIL ) 25 MG tablet [id number]  TAKE 1 TABLET(25 MG) BY MOUTH DAILY  Patient not taking: Reported on 05/08/2024   Del Orbe Polanco, Iliana, FNP  Active Nursing Home Medication Administration Guide (MAG)  latanoprost  (XALATAN ) 0.005 % ophthalmic solution [id number] Yes Place 1 drop into both eyes at bedtime. Pearlean Manus, MD  Active Nursing Home Medication Administration Guide (MAG)  levocetirizine (XYZAL ) 5 MG tablet [id number] Yes TAKE 1 TABLET(5 MG) BY MOUTH EVERY EVENING Del Orbe Polanco, Sola, FNP  Active Nursing Home Medication Administration Guide (MAG)  magnesium hydroxide (MILK OF MAGNESIA) 400 MG/5ML suspension [id number] Yes Take 30 mLs by mouth daily as needed (no BM in 3 days). [provider]  Active Nursing Home Medication Administration Guide (MAG)  melatonin 3 MG TABS tablet [id number] Yes Take 1 tablet (3 mg total) by mouth at bedtime as needed. Arlon Carliss ORN, DO  Active Nursing Home Medication Administration Guide (MAG)  mirabegron  ER (MYRBETRIQ ) 50 MG TB24 tablet [id number] Yes Take 1 tablet (50 mg total) by  mouth every morning. McKenzie, Belvie CROME,  MD  Active Nursing Home Medication Administration Guide (MAG)  Multiple Vitamin (MULTIVITAMIN WITH MINERALS) TABS tablet [id number] Yes Take 1 tablet by mouth daily. [provider]  Active Nursing Home Medication Administration Guide (MAG)  Omega-3 1000 MG CAPS [id number] Yes Take 1,000 mg by mouth daily. [provider]  Active Nursing Home Medication Administration Guide (MAG)  Polyethyl Glycol-Propyl Glycol (LUBRICANT EYE DROPS) 0.4-0.3 % SOLN [id number] Yes Place 1 drop into both eyes every 8 (eight) hours as needed (dry, irritated eyes). [provider]  Active Nursing Home Medication Administration Guide (MAG)  rosuvastatin  (CRESTOR ) 20 MG tablet [id number] Yes Take 1 tablet (20 mg total) by mouth daily.  Patient taking differently: Take 20 mg by mouth every evening.   Thukkani, Arun K, MD  Active Nursing Home Medication Administration Guide (MAG)  sacubitril -valsartan  (ENTRESTO ) 24-26 MG [id number] Yes TAKE 1 TABLET BY MOUTH TWICE DAILY Burnard Debby LABOR, MD  Active Nursing Home Medication Administration Guide (MAG)  Sodium Phosphates (ENEMA) ENEM [id number] Yes Place 1 enema rectally daily as needed (constipation - no relief from bisacodyl suppository). [provider]  Active Nursing Home Medication Administration Guide (MAG)  spironolactone  (ALDACTONE ) 25 MG tablet [id number]  Take 0.5 tablets (12.5 mg total) by mouth daily.  Patient not taking: Reported on 05/08/2024   Joseph [name]  Active Nursing Home Medication Administration Guide (MAG)  traZODone (DESYREL) 50 MG tablet [id number] Yes Take 1 tablet (50 mg total) by mouth at bedtime as needed for sleep. Arlon Carliss ORN, DO  Active Nursing Home Medication Administration Guide (MAG)  Med List Note Geri, Jon HERO, CPhT 04/16/24 0830):   [facility] : [phone]            Home Care and Equipment/Supplies: Were Home Health Services Ordered?: Yes Name of [facility] Health Agency::  [facility] Has Agency set up a time to come to your home?: Yes First Home Health Visit Date: 05/05/24 Any new equipment or medical supplies ordered?: Yes Were you able to get the equipment/medical supplies?: Yes Do you have any questions related to the use of the equipment/supplies?: No  Functional Questionnaire: Do you need assistance with bathing/showering or dressing?: Yes Do you need assistance with meal preparation?: Yes Do you need assistance with eating?: No Do you have difficulty maintaining continence: Yes Do you need assistance with getting out of bed/getting out of a chair/moving?: Yes Do you have difficulty managing or taking your medications?: Yes  Follow up appointments reviewed: PCP Follow-up appointment confirmed?: Yes Date of PCP follow-up appointment?: 05/16/24 Follow-up Provider: Terry Gals [facility] Follow-up appointment confirmed?: Yes Date of Specialist follow-up appointment?: 06/23/24 Follow-Up Specialty Provider:: cardio Do you need transportation to your follow-up appointment?: No Do you understand care options if your condition(s) worsen?: Yes-patient verbalized understanding    SIGNATURE Julian Lemmings, LPN [facility] Nurse Health Advisor Direct Dial [phone]

## 2024-05-09 ENCOUNTER — Telehealth: Payer: Self-pay

## 2024-05-09 NOTE — Telephone Encounter (Signed)
 Copied from CRM [id number]. Topic: Clinical - Home Health Verbal Orders >> May 09, 2024  8:58 AM Antwanette L wrote: Caller/Agency: [facility]/ [facility] Callback Number: [phone] Service Requested: Physical Therapy Frequency: 1w1, 2w4, 1w3 Any new concerns about the patient? No

## 2024-05-09 NOTE — Telephone Encounter (Signed)
 Verbal orders provided.

## 2024-05-10 ENCOUNTER — Telehealth: Payer: Self-pay

## 2024-05-10 NOTE — Telephone Encounter (Signed)
 Copied from CRM #[id number]. Topic: Clinical - Home Health Verbal Orders >> May 10, 2024  4:13 PM Selinda RAMAN wrote: Caller/Agency: Con PT with [facility] Callback Number: [phone] Any new concerns about the patient? Yes she visited with him and reported a high blood pressure of 162/90  She states he was recently in the hospital and they took some meds away from him. His wife Dagoberto has been keeping a log of which she will bring next week to his hospital follow up on Tuesday November 4th. Please assist patient further.

## 2024-05-11 ENCOUNTER — Telehealth: Payer: Self-pay

## 2024-05-11 ENCOUNTER — Other Ambulatory Visit: Payer: Self-pay

## 2024-05-11 DIAGNOSIS — I10 Essential (primary) hypertension: Secondary | ICD-10-CM

## 2024-05-11 DIAGNOSIS — I5189 Other ill-defined heart diseases: Secondary | ICD-10-CM

## 2024-05-11 MED ORDER — SACUBITRIL-VALSARTAN 49-51 MG PO TABS: 1.0000 | ORAL_TABLET | Freq: Two times a day (BID) | ORAL | 3 refills | Status: DC

## 2024-05-11 NOTE — Telephone Encounter (Signed)
 I increased the dose of his Entresto  and sent this in to [facility].  He could take 2 pills of current dose 2 times a day until he picks up new refill.

## 2024-05-11 NOTE — Telephone Encounter (Signed)
 Copied from CRM #[id number]. Topic: Medical Record Request - Records Request >> May 11, 2024  2:38 PM Nathanel BROCKS wrote: Reason for CRM: Shona with [facility] [phone] is needing dr notes from last visit.  Fax [phone]

## 2024-05-11 NOTE — Telephone Encounter (Signed)
 Spoke to [place] , just concerned about blood pressure reading, told her I would pass the message along to Leita since he was seeing her on Tuesday

## 2024-05-11 NOTE — Telephone Encounter (Signed)
 Copied from CRM #[id number]. Topic: Clinical - Home Health Verbal Orders >> May 11, 2024  3:26 PM Darshell M wrote: Caller/Agency: [facility]y Dust[facility]) Callback Number: [phone] confidential voice mail Service Requested: Speech Therapy Frequency: 1 wk x1,  2x per week for  7 weeks Any new concerns about the patient? No

## 2024-05-12 ENCOUNTER — Telehealth: Payer: Self-pay

## 2024-05-12 NOTE — Telephone Encounter (Signed)
 Wife Dagoberto  advised with verbal understanding

## 2024-05-12 NOTE — Telephone Encounter (Signed)
 Copied from CRM [id number]. Topic: Clinical - Home Health Verbal Orders >> May 12, 2024 12:54 PM Larissa RAMAN wrote: Caller/Agency: Rosaline OT/ [facility] Callback Number: [phone], secured VM  Service Requested: Occupational Therapy Frequency: 1x 1w, 2x 3w, hold 1 week, 1x 1w Any new concerns about the patient? No

## 2024-05-12 NOTE — Telephone Encounter (Signed)
 Copied from CRM #[id number]. Topic: Clinical - Home Health Verbal Orders >> May 11, 2024  3:26 PM Darshell M wrote: Caller/Agency: [facility]y Dust[facility]) Callback Number: [phone] confidential voice mail Service Requested: Speech Therapy Frequency: 1 wk x1,  2x per week for  7 weeks Any new concerns about the patient? No

## 2024-05-15 NOTE — Telephone Encounter (Signed)
 Noted, Completed

## 2024-05-15 NOTE — Telephone Encounter (Signed)
 Verbal orders provided on vm

## 2024-05-15 NOTE — Telephone Encounter (Signed)
 [facility] called back to confirm verbal orders, she asks if the verbal order can be left on her secure voicemail because she will be seeing patients all day. Please advise   Best contact: [phone]

## 2024-05-15 NOTE — Telephone Encounter (Signed)
Verbal orders left on vm.

## 2024-05-15 NOTE — Telephone Encounter (Signed)
 faxed

## 2024-05-16 ENCOUNTER — Ambulatory Visit (INDEPENDENT_AMBULATORY_CARE_PROVIDER_SITE_OTHER): Payer: Self-pay

## 2024-05-16 VITALS — BP 130/82 | HR 61 | Ht 70.0 in | Wt 226.0 lb

## 2024-05-16 DIAGNOSIS — I10 Essential (primary) hypertension: Secondary | ICD-10-CM

## 2024-05-16 DIAGNOSIS — N3945 Continuous leakage: Secondary | ICD-10-CM

## 2024-05-16 DIAGNOSIS — N1831 Chronic kidney disease, stage 3a: Secondary | ICD-10-CM

## 2024-05-16 NOTE — Progress Notes (Signed)
 Established Patient Office Visit  Subjective   Patient ID: Joseph [name], male    DOB: Dec 03, 1945  Age: 78 y.o. MRN: [id number]  Chief Complaint  Patient presents with   Hospitalization Follow-up    Hospital Follow up     HPI Discussed the use of AI scribe software for clinical note transcription with the patient, who gave verbal consent to proceed.  History of Present Illness    Joseph [name] is a 78 year old male who presents for follow-up after recent hospitalization and rehabilitation. He is accompanied by his wife, Dagoberto.   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 male with medical history significant for hypertension, hyperlipidemia, atrial fibrillation on Eliquis , CVA, dementia, heart block with pacemaker, chronic HFpEF, and recent admission for suspected syncopal episode who now returns after an episode of unresponsiveness. Patient had been admitted to the hospital on 04/06/2024 after a transient loss of consciousness with right facial droop and AKI superimposed on CKD 3A.  He was seen by neurology during that hospitalization, had no acute findings on MRI or routine EEG, and had borderline positive orthostatic vitals.  Clonidine  and Cardura  were discontinued and he was  discharged to an SNF on 04/13/2024. Per report of the patient's wife at the bedside, the patient has continued to be much more fatigued and generally weak than he was prior to the recent admission. He then had an episode after eating lunch where he appeared to be sleeping but his wife was unable to wake him. She was concerned that he appeared to be gagging or trying to throw up at 1 point during the episode. EMS was called and the patient eventually began to respond after 15 to 20 minutes but remained somnolent and confused. He was seen by neurology here, overnight EEG was without seizures or epileptiform activity.  BP liberalized, holding spironolactone  and hydrochlorothiazide  at discharge.     Discharged 10/7.   Urinary frequency and incontinence - Increased frequency of urination, occurring throughout the day and night - No current use of diuretics (spironolactone  and hydrochlorothiazide  discontinued) - No evidence of fluid retention - Requires frequent use of pull-ups and bed pads due to high urinary output - Seeking assistance with obtaining incontinence supplies due to high usage and cost  Blood pressure elevation - Elevated home blood pressure readings, typically around 160 mmHg - Occasional lower readings - Recent elevated reading possibly influenced by rushing in the morning - Continues to take Entresto   Recent hospitalization and rehabilitation - Recently discharged from hospital and completed rehabilitation - Currently residing at home - Overall condition reportedly improving - Wife manages care at home and arranges for additional assistance as needed     Patient Active Problem List   Diagnosis Date Noted   Continuous leakage of urine 05/21/2024  Dementia (HCC) 04/16/2024   CKD stage 3a, GFR 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) 04/16/2024   AMS (altered mental status) 04/16/2024   Acute encephalopathy 04/15/2024   Syncope and collapse 04/06/2024   Chronic dryness of both eyes 10/28/2023    Chronic hoarseness 06/29/2023   Prediabetes 06/29/2023   AKI (acute kidney injury) 02/03/2022   NICM (nonischemic cardiomyopathy) (HCC) 02/03/2022   Sinus node dysfunction (HCC)    Atrial flutter (HCC) 01/23/2022   OAB (overactive bladder) 12/27/2020   Nocturia 01/10/2020   Benign prostatic hyperplasia with urinary obstruction 01/10/2020   Non-recurrent unilateral inguinal hernia without obstruction or gangrene    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   History of adenomatous polyp of colon 02/27/2019   Paroxysmal atrial fibrillation (HCC) 07/18/2015   Bradycardia 06/29/2015   Near syncope    Atrial fibrillation, chronic (HCC) 06/27/2015   Left ventricular diastolic dysfunction, NYHA class 3 10/26/2014   Kidney cysts 04/04/2013   HTN (hypertension) 03/31/2013    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 03/31/2013   Diastolic dysfunction- grade 2- echo 06/28/15 03/31/2013   RBBB 03/31/2013   AV block, 1st degree 03/31/2013   Sinus bradycardia 03/31/2013   Hematochezia 12/29/2011    ROS    Objective:     BP 130/82 (BP Location: Left Arm, Patient Position: Sitting, Cuff Size: Normal)   Pulse 61   Ht 5' 10 (1.778 m)   Wt 226 lb 0.6 oz (102.5 kg)   SpO2 98%   BMI 32.43 kg/m  BP Readings from Last 3 Encounters:  05/16/24 130/82  04/19/24 (!) 151/91  04/13/24 125/80   Wt Readings from Last 3 Encounters:  05/16/24 226 lb 0.6 oz (102.5 kg)  04/15/24 210 lb (95.3 kg)  04/06/24 228 lb 13.4 oz (103.8 kg)     Physical Exam Vitals and nursing note reviewed.  Constitutional:      Appearance: Normal appearance.  HENT:     Head: Normocephalic.  Eyes:     Extraocular Movements: Extraocular movements intact.     Pupils: Pupils are equal, round, and reactive to light.  Cardiovascular:     Rate and Rhythm: Normal rate and regular rhythm.  Pulmonary:     Effort: Pulmonary effort is normal.     Breath sounds: Normal breath sounds.  Musculoskeletal:     Cervical back: Normal range of motion and  neck supple.  Neurological:     Mental Status: He is alert and oriented to person, place, and time.  Psychiatric:        Mood and Affect: Mood normal.        Thought Content: Thought content normal.          The ASCVD Risk score (Arnett DK, et al., 2019) failed to calculate for the following reasons:   Risk score cannot be calculated because patient has a medical history suggesting prior/existing ASCVD    Assessment & Plan:   Problem List Items Addressed This Visit       Cardiovascular and Mediastinum   HTN (hypertension) (Chronic)   Fair control with current medication regimen.  No changes made today.   Home readings variable.         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 - Recheck kidney function with lab tests.      Relevant Orders   Basic Metabolic Panel (BMET) (Completed)     Other   Continuous leakage of urine  Increased frequency of urination despite discontinuation of diuretics. - Send order for incontinence supplies (extra large pull-ups and bed pads).      Relevant Orders   For home use only DME Other see comment    Return in about 3 months (around 08/16/2024) for chronic follow-up with PCP.    Leita Longs, FNP

## 2024-05-17 LAB — BASIC METABOLIC PANEL WITH GFR
BUN/Creatinine Ratio: 14 (ref 10–24)
BUN: 18 mg/dL (ref 8–27)
CO2: 23 mmol/L (ref 20–29)
Calcium: 9.4 mg/dL (ref 8.6–10.2)
Chloride: 106 mmol/L (ref 96–106)
Creatinine, Ser: 1.27 mg/dL (ref 0.76–1.27)
Glucose: 103 mg/dL — ABNORMAL HIGH (ref 70–99)
Potassium: 4.4 mmol/L (ref 3.5–5.2)
Sodium: 141 mmol/L (ref 134–144)
eGFR: 59 mL/min/1.73 — ABNORMAL LOW (ref >59)

## 2024-05-21 ENCOUNTER — Ambulatory Visit: Payer: Self-pay

## 2024-05-21 DIAGNOSIS — N3945 Continuous leakage: Secondary | ICD-10-CM | POA: Insufficient documentation

## 2024-05-21 NOTE — Assessment & Plan Note (Signed)
 Fair control with current medication regimen.  No changes made today.   Home readings variable.

## 2024-05-21 NOTE — Assessment & Plan Note (Signed)
 Kidney function requires re-evaluation due to recent medication changes. - Recheck kidney function with lab tests.

## 2024-05-21 NOTE — Assessment & Plan Note (Signed)
 Increased frequency of urination despite discontinuation of diuretics. - Send order for incontinence supplies (extra large pull-ups and bed pads).

## 2024-05-27 DIAGNOSIS — N2581 Secondary hyperparathyroidism of renal origin: Secondary | ICD-10-CM | POA: Diagnosis not present

## 2024-05-27 DIAGNOSIS — I5022 Chronic systolic (congestive) heart failure: Secondary | ICD-10-CM | POA: Diagnosis not present

## 2024-05-27 DIAGNOSIS — I130 Hypertensive heart and chronic kidney disease with heart failure and stage 1 through stage 4 chronic kidney disease, or unspecified chronic kidney disease: Secondary | ICD-10-CM | POA: Diagnosis not present

## 2024-05-27 DIAGNOSIS — N1831 Chronic kidney disease, stage 3a: Secondary | ICD-10-CM | POA: Diagnosis not present

## 2024-06-02 ENCOUNTER — Other Ambulatory Visit: Payer: Self-pay

## 2024-06-02 ENCOUNTER — Telehealth: Payer: Self-pay | Admitting: Family Medicine

## 2024-06-02 DIAGNOSIS — I5189 Other ill-defined heart diseases: Secondary | ICD-10-CM

## 2024-06-02 DIAGNOSIS — I10 Essential (primary) hypertension: Secondary | ICD-10-CM

## 2024-06-02 MED ORDER — SACUBITRIL-VALSARTAN 49-51 MG PO TABS: 1.0000 | ORAL_TABLET | Freq: Two times a day (BID) | ORAL | 3 refills | Status: DC

## 2024-06-02 NOTE — Telephone Encounter (Signed)
 Resent

## 2024-06-02 NOTE — Telephone Encounter (Unsigned)
 Copied from CRM [id number]. Topic: Clinical - Medication Refill >> Jun 02, 2024 11:55 AM Harlene ORN wrote: Medication: sacubitril -valsartan  (ENTRESTO ) 49-51 MG  Has the patient contacted their pharmacy? Yes (Agent: If no, request that the patient contact the pharmacy for the refill. If patient does not wish to contact the pharmacy document the reason why and proceed with request.) (Agent: If yes, when and what did the pharmacy advise?)  This is the patient's preferred pharmacy:  [facility][id number] - [place], [place] - [address] [place] [address] Phone: [phone] Fax: [phone]  Is this the correct pharmacy for this prescription? Yes If no, delete pharmacy and type the correct one.   Has the prescription been filled recently? Yes  Is the patient out of the medication? No  Has the patient been seen for an appointment in the last year OR does the patient have an upcoming appointment? Yes  Can we respond through MyChart? No  Agent: Please be advised that Rx refills may take up to 3 business days. We ask that you follow-up with your pharmacy.

## 2024-06-07 ENCOUNTER — Telehealth: Payer: Self-pay

## 2024-06-07 NOTE — Telephone Encounter (Signed)
 Copied from CRM #[id number]. Topic: Clinical - Home Health Verbal Orders >> Jun 07, 2024 12:45 PM Willma R wrote: Caller/Agency: [facility] Callback Number: [phone] ok to leave vm Service Requested: Speech Therapy Frequency: 2 times a week for 3 weeks Any new concerns about the patient? No

## 2024-06-07 NOTE — Telephone Encounter (Signed)
 Verbal orders provided.

## 2024-06-15 ENCOUNTER — Telehealth: Payer: Self-pay | Admitting: Diagnostic Neuroimaging

## 2024-06-15 MED ORDER — DONEPEZIL HCL 10 MG PO TABS: 10.0000 mg | ORAL_TABLET | Freq: Every day | ORAL | 0 refills | Status: DC

## 2024-06-15 NOTE — Telephone Encounter (Signed)
 Patient's wife, Joseph [name] request refill for donepezil  (ARICEPT ) 10 MG tablet send to  [facility] #[id number]   [name] said previous physician that filled this medication is no longer here.

## 2024-06-15 NOTE — Telephone Encounter (Signed)
 Medication Refill Request  last prescibed :Kofi, [place],MD  Last refilled by patient on : 08/26/23 Last office visit : 05/03/23 Next office visit :  07/25/24 Per last note - Ms. Gorgas said previous physician that filled this medication is out of town. Please advise patient is out of medication as of today. I did inform the wife they have not been seen since 05/03/23.

## 2024-06-15 NOTE — Telephone Encounter (Signed)
 Patient has been notified and verbalized understanding that they must get future refills from their PCP.

## 2024-06-15 NOTE — Telephone Encounter (Signed)
 Will refill x 1 month. Will request future refills per PCP.   Meds ordered this encounter  Medications   donepezil  (ARICEPT ) 10 MG tablet    Sig: Take 1 tablet (10 mg total) by mouth daily.    Dispense:  30 tablet    Refill:  0   EDUARD FABIENE HANLON, MD 06/15/2024, 1:38 PM Certified in Neurology, Neurophysiology and Neuroimaging  [facility] [address] [place], [place] [address] [phone]

## 2024-06-19 ENCOUNTER — Other Ambulatory Visit: Payer: Self-pay | Admitting: Family Medicine

## 2024-06-23 ENCOUNTER — Ambulatory Visit: Attending: Internal Medicine | Admitting: Internal Medicine

## 2024-06-23 ENCOUNTER — Encounter: Payer: Self-pay | Admitting: Internal Medicine

## 2024-06-23 ENCOUNTER — Other Ambulatory Visit (HOSPITAL_COMMUNITY)
Admission: RE | Admit: 2024-06-23 | Discharge: 2024-06-23 | Disposition: A | Discharge status: Home / Self Care | Source: Ambulatory Visit | Attending: Internal Medicine | Admitting: Internal Medicine

## 2024-06-23 VITALS — BP 138/86 | HR 57 | Ht 70.0 in | Wt 224.4 lb

## 2024-06-23 DIAGNOSIS — I480 Paroxysmal atrial fibrillation: Secondary | ICD-10-CM

## 2024-06-23 DIAGNOSIS — Z8673 Personal history of transient ischemic attack (TIA), and cerebral infarction without residual deficits: Secondary | ICD-10-CM | POA: Diagnosis not present

## 2024-06-23 DIAGNOSIS — Z7901 Long term (current) use of anticoagulants: Secondary | ICD-10-CM | POA: Diagnosis not present

## 2024-06-23 DIAGNOSIS — Z950 Presence of cardiac pacemaker: Secondary | ICD-10-CM | POA: Diagnosis not present

## 2024-06-23 DIAGNOSIS — I428 Other cardiomyopathies: Secondary | ICD-10-CM

## 2024-06-23 DIAGNOSIS — R0600 Dyspnea, unspecified: Secondary | ICD-10-CM | POA: Insufficient documentation

## 2024-06-23 DIAGNOSIS — I5042 Chronic combined systolic (congestive) and diastolic (congestive) heart failure: Secondary | ICD-10-CM | POA: Insufficient documentation

## 2024-06-23 DIAGNOSIS — G4733 Obstructive sleep apnea (adult) (pediatric): Secondary | ICD-10-CM

## 2024-06-23 LAB — PRO BRAIN NATRIURETIC PEPTIDE: Pro Brain Natriuretic Peptide: 631 pg/mL — ABNORMAL HIGH (ref <300.0)

## 2024-06-23 NOTE — Patient Instructions (Signed)
 Medication Instructions:  Your physician recommends that you continue on your current medications as directed. Please refer to the Current Medication list given to you today.  *If you need a refill on your cardiac medications before your next appointment, please call your pharmacy*  Lab Work: BNP  If you have labs (blood work) drawn today and your tests are completely normal, you will receive your results only by: MyChart Message (if you have MyChart) OR A paper copy in the mail If you have any lab test that is abnormal or we need to change your treatment, we will call you to review the results.  Testing/Procedures: Split Night Sleep Study  Follow-Up: At [facility], you and your health needs are our priority.  As part of our continuing mission to provide you with exceptional heart care, our providers are all part of one team.  This team includes your primary Cardiologist (physician) and Advanced Practice Providers or APPs (Physician Assistants and Nurse Practitioners) who all work together to provide you with the care you need, when you need it.  Your next appointment:   6 month(s)  Provider:   You may see Vishnu Mallipeddi, MD or one of the following Advanced Practice Providers on your designated Care Team:   Brittany Strader, PA-C  [facility] [place], [place] Olivia Pavy, [place]     We recommend signing up for the patient portal called MyChart.  Sign up information is provided on this After Visit Summary.  MyChart is used to connect with patients for Virtual Visits (Telemedicine).  Patients are able to view lab/test results, encounter notes, upcoming appointments, etc.  Non-urgent messages can be sent to your provider as well.   To learn more about what you can do with MyChart, go to [email or web address].   Other Instructions

## 2024-06-23 NOTE — Progress Notes (Signed)
 Cardiology Office Note  Date: 06/23/2024   ID: Joseph, [name] 1946/04/13, MRN [id number]  PCP:  Terry Wilhelmena Lloyd Hilario, FNP  Cardiologist:  Debby Sor, MD (Inactive) Electrophysiologist:  None   History of Present Illness: Joseph [name] is a 78 y.o. male  Previous patient of Dr. Sor.  Switched care due to Dr. Joesphine retirement.  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 Collateral history is obtained by the wife.  Denies having any angina or DOE.  Reports having PND for many years.  No orthopnea, leg swelling, abdominal distention.  No dizziness, syncope.  However he had many falls.  Last fall this morning.  Denies have any dizziness prior to the fall.  He also has increased urinary frequency and urgency.  Sees urologist.  Past Medical History:  Diagnosis Date   1st degree AV block    Atrial fibrillation (HCC)    Diastolic dysfunction 4/13   grade 1   Hypertension    New onset atrial fibrillation (HCC) 06/2015   RBBB    Sinus bradycardia    Stroke (HCC)    no defecits, pt denies having a stroke, says Dr Sor diagnosed this    Past Surgical History:  Procedure Laterality Date   BIV PACEMAKER INSERTION CRT-P N/A 01/26/2022   Procedure: BIV PACEMAKER INSERTION CRT-P;  Surgeon: Waddell Danelle ORN, MD;  Location: [facility] INVASIVE CV LAB;  Service: Cardiovascular;  Laterality: N/A;   COLONOSCOPY     COLONOSCOPY  12/30/2011   Procedure: COLONOSCOPY;  Surgeon: Lamar CHRISTELLA Hollingshead, MD;  Location: [facility]O SUITE;  Service: Endoscopy;  Laterality: N/A;  1:45PM   COLONOSCOPY N/A 04/19/2019   Procedure: COLONOSCOPY;  Surgeon: Hollingshead Lamar CHRISTELLA, MD;  Location: [facility];  Service: Endoscopy;  Laterality: N/A;  2:00pm   EXCISION MASS LOWER EXTREMETIES Left 05/12/2018   Procedure: Left foot plantar fibroma excision;   Surgeon: Kit Rush, MD;  Location: [facility];  Service: Orthopedics;  Laterality: Left;   FOOT SURGERY Left    LEFT HEART CATH AND CORONARY ANGIOGRAPHY N/A 01/26/2022   Procedure: LEFT HEART CATH AND CORONARY ANGIOGRAPHY;  Surgeon: Sor Debby LABOR, MD;  Location: [facility] INVASIVE CV LAB;  Service: Cardiovascular;  Laterality: N/A;   SPERMATOCELECTOMY Right 11/13/2019   Procedure: EXCISION OF SPERMATIC CORD CYST;  Surgeon: Sherrilee Belvie CROME, MD;  Location: [facility];  Service: Urology;  Laterality: Right;   VASECTOMY  11/13/2019   Procedure: VASECTOMY;  Surgeon: Sherrilee Belvie CROME, MD;  Location: [facility] OR[facility];  Service: Urology;;    Current Outpatient Medications  Medication Sig Dispense Refill   alfuzosin  (UROXATRAL ) 10 MG 24 hr tablet Take 1 tablet (10 mg total) by mouth every morning. 90 tablet 3   apixaban  (ELIQUIS ) 5 MG TABS tablet Take 1 tablet (5 mg total) by mouth 2 (two) times daily. 180 tablet 1   bisacodyl (DULCOLAX) 10 MG suppository Place 10 mg rectally daily as needed (constipation - no relief from MoM).     carvedilol  (COREG ) 12.5 MG tablet TAKE 1 TABLET(12.5 MG) BY MOUTH TWICE DAILY 180 tablet 3   cholecalciferol  (VITAMIN D3) 25 MCG (1000 UNIT) tablet Take 1 tablet (1,000 Units total) by mouth daily. 90 tablet 1   cycloSPORINE  (RESTASIS ) 0.05 % ophthalmic emulsion Place 1 drop into both eyes 2 (two) times daily. 60 each 2  donepezil  (ARICEPT ) 10 MG tablet Take 1 tablet (10 mg total) by mouth daily. 30 tablet 0   fluticasone  (FLONASE ) 50 MCG/ACT nasal spray Place 2 sprays into both nostrils daily. 16 g 6   [Paused] hydrochlorothiazide  (HYDRODIURIL ) 25 MG tablet TAKE 1 TABLET(25 MG) BY MOUTH DAILY (Patient not taking: Reported on 05/16/2024) 90 tablet 0   latanoprost  (XALATAN ) 0.005 % ophthalmic solution Place 1 drop into both eyes at bedtime. 2.5 mL 12   magnesium hydroxide (MILK OF MAGNESIA) 400 MG/5ML suspension Take 30 mLs by mouth daily as needed (no BM in 3 days).      melatonin 3 MG TABS tablet Take 1 tablet (3 mg total) by mouth at bedtime as needed.     mirabegron  ER (MYRBETRIQ ) 50 MG TB24 tablet Take 1 tablet (50 mg total) by mouth every morning. 90 tablet 3   Multiple Vitamin (MULTIVITAMIN WITH MINERALS) TABS tablet Take 1 tablet by mouth daily.     Omega-3 1000 MG CAPS Take 1,000 mg by mouth daily.     Polyethyl Glycol-Propyl Glycol (LUBRICANT EYE DROPS) 0.4-0.3 % SOLN Place 1 drop into both eyes every 8 (eight) hours as needed (dry, irritated eyes).     rosuvastatin  (CRESTOR ) 20 MG tablet Take 1 tablet (20 mg total) by mouth daily. (Patient not taking: Reported on 05/16/2024) 30 tablet 3   sacubitril -valsartan  (ENTRESTO ) 49-51 MG Take 1 tablet by mouth 2 (two) times daily. 60 tablet 3   Sodium Phosphates (ENEMA) ENEM Place 1 enema rectally daily as needed (constipation - no relief from bisacodyl suppository).     [Paused] spironolactone  (ALDACTONE ) 25 MG tablet Take 0.5 tablets (12.5 mg total) by mouth daily. (Patient not taking: Reported on 05/16/2024) 45 tablet 1   traZODone  (DESYREL ) 50 MG tablet Take 1 tablet (50 mg total) by mouth at bedtime as needed for sleep.     No current facility-administered medications for this visit.   Allergies:  Patient has no known allergies.   Social History: The patient  reports that he quit smoking about 29 years ago. His smoking use included cigarettes. He has never used smokeless tobacco. He reports that he does not currently use alcohol . He reports that he does not use drugs.   Family History: The patient's family history includes CVA in his mother; Cancer in his brother; Hypertension in his father.   ROS:  Please see the history of present illness. Otherwise, complete review of systems is positive for none.  All other systems are reviewed and negative.   Physical Exam: VS:  There were no vitals taken for this visit., BMI There is no height or weight on file to calculate BMI.  Wt Readings from Last 3 Encounters:   05/16/24 226 lb 0.6 oz (102.5 kg)  04/15/24 210 lb (95.3 kg)  04/06/24 228 lb 13.4 oz (103.8 kg)    General: Patient appears comfortable at rest. HEENT: Conjunctiva and lids normal, oropharynx clear with moist mucosa. Neck: Supple, no elevated JVP or carotid bruits, no thyromegaly. Lungs: Clear to auscultation, nonlabored breathing at rest. Cardiac: Regular rate and rhythm, no S3 or significant systolic murmur, no pericardial rub. Abdomen: Soft, nontender, no hepatomegaly, bowel sounds present, no guarding or rebound. Extremities: No pitting edema, distal pulses 2+. Skin: Warm and dry. Musculoskeletal: No kyphosis. Neuropsychiatric: Alert and oriented x3, affect grossly appropriate.  Recent Labwork: 01/19/2024: TSH 1.36 04/15/2024: ALT 15; AST 27 04/18/2024: Hemoglobin 11.4; Platelets 200 05/16/2024: BUN 18; Creatinine, Ser 1.27; Potassium 4.4; Sodium 141  Component Value Date/Time   CHOL 139 04/07/2024 0530   CHOL 166 06/29/2023 1125   TRIG 86 04/07/2024 0530   HDL 41 04/07/2024 0530   HDL 57 06/29/2023 1125   CHOLHDL 3.4 04/07/2024 0530   VLDL 17 04/07/2024 0530   LDLCALC 81 04/07/2024 0530   LDLCALC 106 (H) 01/19/2024 0930     Assessment and Plan:  PND - He reports having PND.  No orthopnea, DOE, leg swelling.  Ongoing PND for many years.  Not much active.  Obtain BNP.  Paroxysmal atrial fibrillation History of multiple strokes - EKG from 10//25 showed AV dual paced rhythm - Continue carvedilol  12.5 mg twice daily - Continue Eliquis  5 mg twice daily - He was never tested for OSA.  Will obtain split-night sleep study.  Chronic systolic heart failure: LVEF 40 to 45% recently in September 2025.  Nonischemic in etiology based on LHC from 2023.  Spironolactone  held during recent hospitalization due to soft blood pressures, falls and increased urinary frequency.  Continue GDMT with carvedilol  12.5 mg twice daily, Entresto  49-51 milligram twice daily.  He has risk of falls  and last fall was this morning.  Will need to optimize GDMT slowly.  Heart block s/p [employer] CRT-P: Follows up with electrophysiology for device checks.  History of multiple strokes: Walks with walker now.  In physical therapy now.  Not on aspirin  due to Eliquis  use.  Continue rosuvastatin  20 mg nightly.  Goal LDL less than 70.  On donepezil  for cognitive impairment.    15 minutes spent in reviewing prior specialist notes, reports, more than 3 labs.  25 minutes spent with the patient in discussing the above problems, management and documentation.    Medication Adjustments/Labs and Tests Ordered: Current medicines are reviewed at length with the patient today.  Concerns regarding medicines are outlined above.    Disposition:  Follow up 6 months  Signed, Clydine Parkison Arleta Maywood, MD, 06/23/2024 10:18 AM    [facility] [address] [place], [place] [address]

## 2024-06-28 ENCOUNTER — Ambulatory Visit: Payer: Self-pay | Admitting: Internal Medicine

## 2024-06-28 MED ORDER — FUROSEMIDE 40 MG PO TABS: 40.0000 mg | ORAL_TABLET | Freq: Every day | ORAL | 3 refills | Status: AC

## 2024-06-28 NOTE — Telephone Encounter (Signed)
 The wife has been notified of the result and verbalized understanding.  All questions (if any) were answered. Bernett Dorothyann LABOR, RN 06/28/2024 4:48 PM   Rx lasix  40 mg daily sent to [facility] on [address]

## 2024-06-28 NOTE — Telephone Encounter (Signed)
-----   Message from Vishnu Mallipeddi, MD sent at 06/28/2024  4:29 PM EST ----- Pro-BNP elevated. D/c hydrochlorothiazide  and start lasix  40 mg once daily. Watch out for increased urination.

## 2024-07-05 ENCOUNTER — Other Ambulatory Visit: Payer: Self-pay | Admitting: Internal Medicine

## 2024-07-05 DIAGNOSIS — I480 Paroxysmal atrial fibrillation: Secondary | ICD-10-CM

## 2024-07-07 ENCOUNTER — Telehealth: Payer: Self-pay | Admitting: Internal Medicine

## 2024-07-07 ENCOUNTER — Telehealth: Payer: Self-pay

## 2024-07-07 ENCOUNTER — Other Ambulatory Visit: Payer: Self-pay | Admitting: *Deleted

## 2024-07-07 MED ORDER — CARVEDILOL 12.5 MG PO TABS: 12.5000 mg | ORAL_TABLET | Freq: Two times a day (BID) | ORAL | 3 refills | Status: AC

## 2024-07-07 NOTE — Telephone Encounter (Signed)
 Prescription refill request for Eliquis  received. Indication: A-Fib Last office visit: 05/14/23 with Dr. Waddell Scr: 1.27 05/16/24 Care Everywhere Age: 78 Weight: 101.8 KG  Pt has passed all Parameters. Pt is overdue for a followup appt with EP

## 2024-07-07 NOTE — Telephone Encounter (Signed)
 Had office visit 06/23/24. Ok to fill.

## 2024-07-07 NOTE — Telephone Encounter (Signed)
 Neither of these medications have been handled by this office, eliquis  is managed by cardiology

## 2024-07-07 NOTE — Telephone Encounter (Signed)
" °*  STAT* If patient is at the pharmacy, call can be transferred to refill team.   1. Which medications need to be refilled? (please list name of each medication and dose if known)   ELIQUIS  5 MG TABS tablet     2. Would you like to learn more about the convenience, safety, & potential cost savings by using the [facility]? No    3. Are you open to using the [facility] (Type [facility]. No    4. Which pharmacy/location (including street and city if local pharmacy) is medication to be sent to? [id number] - [place], [place] - [address]     5. Do they need a 30 day or 90 day supply? 90 day    "

## 2024-07-07 NOTE — Telephone Encounter (Signed)
 Attempted to reach pt and spoke with spouse. Advised spouse of refill sent to preferred pharmacy.

## 2024-07-07 NOTE — Telephone Encounter (Signed)
 Copied from CRM #[id number]. Topic: Clinical - Medication Question >> Jul 07, 2024 11:11 AM Wess RAMAN wrote: Reason for CRM: Patient's wife, Dagoberto, would like to know if patient's prescription for rosuvastatin  (CRESTOR ) changed from 5 MG to 20 MG. She also wanted to know if the ELIQUIS  5 MG TABS tablet was sent in for 90 day supply.  Callback #: [phone]

## 2024-07-07 NOTE — Telephone Encounter (Signed)
" °*  STAT* If patient is at the pharmacy, call can be transferred to refill team.   1. Which medications need to be refilled? (please list name of each medication and dose if known) carvedilol  (COREG ) 12.5 MG tablet    2. Would you like to learn more about the convenience, safety, & potential cost savings by using the [facility]? No    3. Are you open to using the [facility] ([facility]. No    4. Which pharmacy/location (including street and city if local pharmacy) is medication to be sent to? [id number] - [place], [place] - [address] AT [address]     5. Do they need a 30 day or 90 day supply? 90 day   "

## 2024-07-10 DIAGNOSIS — G4700 Insomnia, unspecified: Secondary | ICD-10-CM | POA: Diagnosis not present

## 2024-07-10 DIAGNOSIS — N1831 Chronic kidney disease, stage 3a: Secondary | ICD-10-CM | POA: Diagnosis not present

## 2024-07-10 DIAGNOSIS — I5032 Chronic diastolic (congestive) heart failure: Secondary | ICD-10-CM | POA: Diagnosis not present

## 2024-07-10 DIAGNOSIS — G9340 Encephalopathy, unspecified: Secondary | ICD-10-CM | POA: Diagnosis not present

## 2024-07-10 DIAGNOSIS — Z7901 Long term (current) use of anticoagulants: Secondary | ICD-10-CM | POA: Diagnosis not present

## 2024-07-10 DIAGNOSIS — Z9181 History of falling: Secondary | ICD-10-CM | POA: Diagnosis not present

## 2024-07-10 DIAGNOSIS — F02818 Dementia in other diseases classified elsewhere, unspecified severity, with other behavioral disturbance: Secondary | ICD-10-CM | POA: Diagnosis not present

## 2024-07-10 DIAGNOSIS — I480 Paroxysmal atrial fibrillation: Secondary | ICD-10-CM | POA: Diagnosis not present

## 2024-07-10 DIAGNOSIS — E538 Deficiency of other specified B group vitamins: Secondary | ICD-10-CM | POA: Diagnosis not present

## 2024-07-10 DIAGNOSIS — I130 Hypertensive heart and chronic kidney disease with heart failure and stage 1 through stage 4 chronic kidney disease, or unspecified chronic kidney disease: Secondary | ICD-10-CM | POA: Diagnosis not present

## 2024-07-10 DIAGNOSIS — G459 Transient cerebral ischemic attack, unspecified: Secondary | ICD-10-CM | POA: Diagnosis not present

## 2024-07-11 ENCOUNTER — Other Ambulatory Visit: Payer: Self-pay | Admitting: Family Medicine

## 2024-07-11 DIAGNOSIS — I5189 Other ill-defined heart diseases: Secondary | ICD-10-CM

## 2024-07-11 DIAGNOSIS — I10 Essential (primary) hypertension: Secondary | ICD-10-CM

## 2024-07-11 MED ORDER — DONEPEZIL HCL 10 MG PO TABS: 10.0000 mg | ORAL_TABLET | Freq: Every day | ORAL | 0 refills | Status: AC

## 2024-07-11 MED ORDER — SACUBITRIL-VALSARTAN 49-51 MG PO TABS: 1.0000 | ORAL_TABLET | Freq: Two times a day (BID) | ORAL | 3 refills | Status: AC

## 2024-07-11 NOTE — Telephone Encounter (Signed)
 Copied from CRM #[id number]. Topic: Clinical - Medication Refill >> Jul 11, 2024 12:55 PM Rachelle R wrote: Medication:  donepezil  (ARICEPT ) 10 MG tablet sacubitril -valsartan  (ENTRESTO ) 49-51 MG *patient states his insurance requires a 90 day supply.  Has the patient contacted their pharmacy? Yes, call dr  This is the patient's preferred pharmacy:  [facility] #[id number] - [place], [place] - [address] [place] [address] Phone: [phone] Fax: [phone]  Is this the correct pharmacy for this prescription? yes  Has the prescription been filled recently? No  Is the patient out of the medication? No  Has the patient been seen for an appointment in the last year OR does the patient have an upcoming appointment? Yes  Can we respond through MyChart? No  Agent: Please be advised that Rx refills may take up to 3 business days. We ask that you follow-up with your pharmacy.

## 2024-07-17 ENCOUNTER — Encounter

## 2024-07-17 VITALS — Ht 70.0 in | Wt 224.0 lb

## 2024-07-17 DIAGNOSIS — Z0000 Encounter for general adult medical examination without abnormal findings: Secondary | ICD-10-CM | POA: Diagnosis not present

## 2024-07-17 NOTE — Progress Notes (Signed)
 "  Wife requesting prescription be sent to local pharmacy for adult pull ups and chux pads to assist with cost and due to patient urinary frequency and number of accidents per day.   Chief Complaint  Patient presents with   Medicare Wellness     Subjective:   Joseph [name] is a 79 y.o. male who presents for a Medicare Annual Wellness Visit.  Visit info / Clinical Intake: Medicare Wellness Visit Type:: Subsequent Annual Wellness Visit Persons participating in visit and providing information:: patient & caregiver Medicare Wellness Visit Mode:: Telephone If telephone:: video error Since this visit was completed virtually, some vitals may be partially provided or unavailable. Missing vitals are due to the limitations of the virtual format.: Documented vitals are patient reported If Telephone or Video please confirm:: I connected with patient using audio/video enable telemedicine. I verified patient identity with two identifiers, discussed telehealth limitations, and patient agreed to proceed. Patient Location:: home Provider Location:: office Interpreter Needed?: No Pre-visit prep was completed: yes AWV questionnaire completed by patient prior to visit?: no Living arrangements:: lives with spouse/significant other Patient's Overall Health Status Rating: very good Typical amount of pain: none Does pain affect daily life?: no Are you currently prescribed opioids?: no  Functional Status Ambulation: Independent with device- listed below Home Assistive Devices/Equipment: Walker (specify Type) Medication Administration: Dependent Is this a change from baseline?: Pre-admission baseline Home Management (perform basic housework or laundry): Dependent Manage your own finances?: (!) no Primary transportation is: family / friends Concerns about vision?: no *vision screening is required for WTM* Concerns about hearing?: no  Fall Screening Falls in the past year?: 1 Number of falls in  past year: 1 Was there an injury with Fall?: 0 Fall Risk Category Calculator: 2 Patient Fall Risk Level: Moderate Fall Risk  Fall Risk Patient at Risk for Falls Due to: History of fall(s); Impaired balance/gait; Impaired mobility Fall risk Follow up: Falls evaluation completed; Education provided; Falls prevention discussed  Home and Transportation Safety: All rugs have non-skid backing?: N/A, no rugs All stairs or steps have railings?: yes Grab bars in the bathtub or shower?: yes Have non-skid surface in bathtub or shower?: yes Good home lighting?: yes Regular seat belt use?: yes Hospital stays in the last year:: (!) yes How many hospital stays:: 2 Reason: slurred speech, loss of consciousness  Cognitive Assessment Difficulty concentrating, remembering, or making decisions? : yes Will 6CIT or Mini Cog be Completed: no 6CIT or Mini Cog Declined: patient has a diagnosis of dementia or cognitive impairment  Advance Directives (For Healthcare) Does Patient Have a Medical Advance Directive?: No Does patient want to make changes to medical advance directive?: No - Patient declined Type of Advance Directive: Healthcare Power of Attorney Copy of Healthcare Power of Attorney in Chart?: No - copy requested Copy of Living Will in Chart?: No - copy requested Would patient like information on creating a medical advance directive?: Yes (MAU/Ambulatory/Procedural Areas - Information given)  Reviewed/Updated  Reviewed/Updated: Reviewed All (Medical, Surgical, Family, Medications, Allergies, Care Teams, Patient Goals)    Allergies (verified) Patient has no known allergies.   Current Medications (verified) Outpatient Encounter Medications as of 07/17/2024  Medication Sig   alfuzosin  (UROXATRAL ) 10 MG 24 hr tablet Take 1 tablet (10 mg total) by mouth every morning.   bisacodyl (DULCOLAX) 10 MG suppository Place 10 mg rectally daily as needed (constipation - no relief from MoM).   carvedilol   (COREG ) 12.5 MG tablet Take 1 tablet (12.5 mg total)  by mouth in the morning and at bedtime.   cholecalciferol  (VITAMIN D3) 25 MCG (1000 UNIT) tablet Take 1 tablet (1,000 Units total) by mouth daily.   cycloSPORINE  (RESTASIS ) 0.05 % ophthalmic emulsion Place 1 drop into both eyes 2 (two) times daily.   donepezil  (ARICEPT ) 10 MG tablet Take 1 tablet (10 mg total) by mouth daily.   ELIQUIS  5 MG TABS tablet TAKE 1 TABLET(5 MG) BY MOUTH TWICE DAILY   fluticasone  (FLONASE ) 50 MCG/ACT nasal spray Place 2 sprays into both nostrils daily.   furosemide  (LASIX ) 40 MG tablet Take 1 tablet (40 mg total) by mouth daily.   latanoprost  (XALATAN ) 0.005 % ophthalmic solution Place 1 drop into both eyes at bedtime.   magnesium hydroxide (MILK OF MAGNESIA) 400 MG/5ML suspension Take 30 mLs by mouth daily as needed (no BM in 3 days).   melatonin 3 MG TABS tablet Take 1 tablet (3 mg total) by mouth at bedtime as needed.   mirabegron  ER (MYRBETRIQ ) 50 MG TB24 tablet Take 1 tablet (50 mg total) by mouth every morning.   Multiple Vitamin (MULTIVITAMIN WITH MINERALS) TABS tablet Take 1 tablet by mouth daily.   Omega-3 1000 MG CAPS Take 1,000 mg by mouth daily.   Polyethyl Glycol-Propyl Glycol (LUBRICANT EYE DROPS) 0.4-0.3 % SOLN Place 1 drop into both eyes every 8 (eight) hours as needed (dry, irritated eyes).   rosuvastatin  (CRESTOR ) 20 MG tablet Take 1 tablet (20 mg total) by mouth daily.   sacubitril -valsartan  (ENTRESTO ) 49-51 MG Take 1 tablet by mouth 2 (two) times daily.   Sodium Phosphates (ENEMA) ENEM Place 1 enema rectally daily as needed (constipation - no relief from bisacodyl suppository).   traZODone  (DESYREL ) 50 MG tablet Take 1 tablet (50 mg total) by mouth at bedtime as needed for sleep.   No facility-administered encounter medications on file as of 07/17/2024.    History: Past Medical History:  Diagnosis Date   1st degree AV block    Atrial fibrillation (HCC)    Diastolic dysfunction 4/13   grade 1    Hypertension    New onset atrial fibrillation (HCC) 06/2015   RBBB    Sinus bradycardia    Stroke (HCC)    no defecits, pt denies having a stroke, says Dr Burnard diagnosed this   Past Surgical History:  Procedure Laterality Date   BIV PACEMAKER INSERTION CRT-P N/A 01/26/2022   Procedure: BIV PACEMAKER INSERTION CRT-P;  Surgeon: Waddell Danelle ORN, MD;  [facility] INVASIVE CV LAB;  Service: Cardiovascular;  Laterality: N/A;   COLONOSCOPY     COLONOSCOPY  12/30/2011   Procedure: COLONOSCOPY;  Surgeon: Lamar CHRISTELLA Hollingshead, MD;  Location: [facility];  Service: Endoscopy;  Laterality: N/A;  1:45PM   COLONOSCOPY N/A 04/19/2019   Procedure: COLONOSCOPY;  Surgeon: Hollingshead Lamar CHRISTELLA, MD;  Location: [facility];  Service: Endoscopy;  Laterality: N/A;  2:00pm   EXCISION MASS LOWER EXTREMETIES Left 05/12/2018   Procedure: Left foot plantar fibroma excision;  Surgeon: Kit Rush, MD;  Location: [facility];  Service: Orthopedics;  Laterality: Left;   FOOT SURGERY Left    LEFT HEART CATH AND CORONARY ANGIOGRAPHY N/A 01/26/2022   Procedure: LEFT HEART CATH AND CORONARY ANGIOGRAPHY;  Surgeon: Burnard Debby LABOR, MD;  Location: [facility] INVASIVE CV LAB;  Service: Cardiovascular;  Laterality: N/A;   SPERMATOCELECTOMY Right 11/13/2019   Procedure: EXCISION OF SPERMATIC CORD CYST;  Surgeon: Sherrilee Belvie CROME, MD;  Location: [facility];  Service: Urology;  Laterality:  Right;   VASECTOMY  11/13/2019   Procedure: VASECTOMY;  Surgeon: Sherrilee Belvie CROME, MD;  Location: [facility];  Service: Urology;;   Family History  Problem Relation Age of Onset   Cancer Brother    CVA Mother    Hypertension Father    Colon cancer Neg Hx    Social History   Occupational History   Not on file  Tobacco Use   Smoking status: Former    Current packs/day: 0.00    Types: Cigarettes    Quit date: 1996    Years since quitting: 30.0   Smokeless tobacco: Never   Tobacco comments:    quit about 40 yrs ago  Vaping Use   Vaping  status: Never Used  Substance and Sexual Activity   Alcohol  use: Not Currently    Comment: 6 pack of beer or less in a week   Drug use: No   Sexual activity: Not on file   Tobacco Counseling Counseling given: Yes Tobacco comments: quit about 40 yrs ago  SDOH Screenings   Food Insecurity: No Food Insecurity (07/17/2024)  Housing: Low Risk (07/17/2024)  Transportation Needs: No Transportation Needs (07/17/2024)  Utilities: Not At Risk (07/17/2024)  Alcohol  Screen: Low Risk (04/12/2023)  Depression (PHQ2-9): Low Risk (07/17/2024)  Recent Concern: Depression (PHQ2-9) - High Risk (05/16/2024)  Financial Resource Strain: Low Risk (04/12/2023)  Physical Activity: Inactive (04/12/2023)  Social Connections: Moderately Isolated (07/17/2024)  Stress: No Stress Concern Present (07/17/2024)  Tobacco Use: Medium Risk (07/17/2024)  Health Literacy: Patient Unable To Answer (07/17/2024)   See flowsheets for full screening details  Depression Screen PHQ 2 & 9 Depression Scale- Over the past 2 weeks, how often have you been bothered by any of the following problems? Little interest or pleasure in doing things: 0 Feeling down, depressed, or hopeless (PHQ Adolescent also includes...irritable): 0 PHQ-2 Total Score: 0 Trouble falling or staying asleep, or sleeping too much: 3 Feeling tired or having little energy: 3 Poor appetite or overeating (PHQ Adolescent also includes...weight loss): 0 Feeling bad about yourself - or that you are a failure or have let yourself or your family down: 0 Trouble concentrating on things, such as reading the newspaper or watching television (PHQ Adolescent also includes...like school work): 3 Moving or speaking so slowly that other people could have noticed. Or the opposite - being so fidgety or restless that you have been moving around a lot more than usual: 2 Thoughts that you would be better off dead, or of hurting yourself in some way: 0 PHQ-9 Total Score: 14 If you checked off any  problems, how difficult have these problems made it for you to do your work, take care of things at home, or get along with other people?: Somewhat difficult  Depression Treatment Depression Interventions/Treatment : EYV7-0 Score <4 Follow-up Not Indicated     Goals Addressed               This Visit's Progress     Remain active and healthy (pt-stated)               Objective:    Today's Vitals   07/17/24 1142  Weight: 224 lb (101.6 kg)  Height: 5' 10 (1.778 m)   Body mass index is 32.14 kg/m.  Hearing/Vision screen Hearing Screening - Comments:: Patient denies any hearing difficulties.   Vision Screening - Comments:: Patient is up to date on eye exams with Will[facility] Moats Immunizations and Health Maintenance Health Maintenance  Topic Date  Due   COVID-19 Vaccine (1) Never done   DTaP/Tdap/Td (1 - Tdap) Never done   Pneumococcal Vaccine: 50+ Years (1 of 2 - PCV) Never done   Zoster Vaccines- Shingrix (1 of 2) Never done   Medicare Annual Wellness (AWV)  04/11/2024   Influenza Vaccine  10/10/2024 (Originally 02/11/2024)   Hepatitis C Screening  Completed   Meningococcal B Vaccine  Aged Out   Colonoscopy  Discontinued        Assessment/Plan:  This is a routine wellness examination for Joseph [name].  Patient Care Team: Del Wilhelmena Falter, Hilario, FNP as PCP - General (Family Medicine) Shaaron Lamar HERO, MD (Gastroenterology) Dr [facility], OD McKenzie, Belvie CROME, MD as Consulting Physician (Urology) Rachele Gaynell RAMAN, MD as Consulting Physician (Nephrology) Waddell Danelle ORN, MD as Consulting Physician (Cardiology) Mallipeddi, Diannah SQUIBB, MD as Consulting Physician (Cardiology)  I have personally reviewed and noted the following in the patients chart:   Medical and social history Use of alcohol , tobacco or illicit drugs  Current medications and supplements including opioid prescriptions. Functional ability and status Nutritional  status Physical activity Advanced directives List of other physicians Hospitalizations, surgeries, and ER visits in previous 12 months Vitals Screenings to include cognitive, depression, and falls Referrals and appointments  No orders of the defined types were placed in this encounter.  In addition, I have reviewed and discussed with patient certain preventive protocols, quality metrics, and best practice recommendations. A written personalized care plan for preventive services as well as general preventive health recommendations were provided to patient.   Joseph [name], CMA   07/17/2024   Return July 18, 2025 at 1:10 pm, for Joseph [name] Visit w/ Wellness Nurse.  After Visit Summary: (Mail) Due to this being a telephonic visit, the after visit summary with patients personalized plan was offered to patient via mail   "

## 2024-07-17 NOTE — Patient Instructions (Signed)
 Joseph [name],  Thank you for taking the time for your Medicare Wellness Visit. I appreciate your continued commitment to your health goals. Please review the care plan we discussed, and feel free to reach out if I can assist you further.  Please note that Annual Wellness Visits do not include a physical exam. Some assessments may be limited, especially if the visit was conducted virtually. If needed, we may recommend an in-person follow-up with your provider.  Ongoing Care Seeing your primary care provider every 3 to 6 months helps us  monitor your health and provide consistent, personalized care.   1 year follow up for Medicare well visit: July 18, 2025 at 1:10 pm with medicare wellness nurse in office  Recommended Screenings:  Health Maintenance  Topic Date Due   COVID-19 Vaccine (1) Never done   DTaP/Tdap/Td vaccine (1 - Tdap) Never done   Pneumococcal Vaccine for age over 72 (1 of 2 - PCV) Never done   Zoster (Shingles) Vaccine (1 of 2) Never done   Medicare Annual Wellness Visit  04/11/2024   Flu Shot  10/10/2024*   Hepatitis C Screening  Completed   Meningitis B Vaccine  Aged Out   Colon Cancer Screening  Discontinued  *Topic was postponed. The date shown is not the original due date.       07/17/2024   11:45 AM  Advanced Directives  Does Patient Have a Medical Advance Directive? No  Would patient like information on creating a medical advance directive? Yes (MAU/Ambulatory/Procedural Areas - Information given)    Vision: Annual vision screenings are recommended for early detection of glaucoma, cataracts, and diabetic retinopathy. These exams can also reveal signs of chronic conditions such as diabetes and high blood pressure.  Dental: Annual dental screenings help detect early signs of oral cancer, gum disease, and other conditions linked to overall health, including heart disease and diabetes.  Please see the attached documents for additional preventive care  recommendations.

## 2024-07-20 ENCOUNTER — Ambulatory Visit: Admitting: Family Medicine

## 2024-07-25 ENCOUNTER — Ambulatory Visit: Payer: Medicare PPO

## 2024-07-25 ENCOUNTER — Encounter: Payer: Self-pay | Admitting: Diagnostic Neuroimaging

## 2024-07-25 ENCOUNTER — Ambulatory Visit: Admitting: Diagnostic Neuroimaging

## 2024-07-25 VITALS — BP 142/80 | HR 59 | Ht 70.0 in | Wt 221.0 lb

## 2024-07-25 DIAGNOSIS — F03B18 Unspecified dementia, moderate, with other behavioral disturbance: Secondary | ICD-10-CM

## 2024-07-25 DIAGNOSIS — R413 Other amnesia: Secondary | ICD-10-CM | POA: Diagnosis not present

## 2024-07-25 NOTE — Patient Instructions (Addendum)
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

## 2024-07-25 NOTE — Progress Notes (Signed)
 "  [facility]  PATIENT: Joseph [name] DOB: [birth year]  REFERRING CLINICIAN: Del Wilhelmena Lloyd Sola, FNP HISTORY FROM: PATIENT  REASON FOR VISIT: NEW CONSULT   HISTORICAL  CHIEF COMPLAINT:  Chief Complaint  Patient presents with   New Patient (Initial Visit)    Pt in 6 with wife. Patients wife stated that he went to the hospital in September 25 for a TIA. Patients wife wants to know if he is getting worse with the dementia, states that he doesn't have control of his bladder. Patients wife states he doesn't want to communicate, that he wants to just sit. Patients wife stated that after she asks multiple times patient will become agitated.     HISTORY OF PRESENT ILLNESS:   UPDATE (07/25/24, VRP): Since last visit, has had progression of memory decline. Also had 2 episodes of confusion, decreased responsiveness in sept and oct 2025. Had hospital eval, MRI, EEG which were unremarkable. Also having more issues of agitation and incontinence.   PRIOR HPI (05/03/23, VRP): 79 year old male here for evaluation of slurred speech, stuttering speech, hoarse voice.  For past 1 year patient has had a soft voice, hoarse voice that is progressively worsened.  In the last 1 month has had increasing slurred speech and stuttering speech.  Has been noted to have some right facial weakness.  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  Has also been diagnosed with dementia by Dr. Vinson office about 2 years ago.   REVIEW OF SYSTEMS: Full 14 system review of systems performed and negative with exception of: as per HPI.  ALLERGIES: No Known Allergies  HOME MEDICATIONS: Outpatient Medications Prior to Visit  Medication Sig Dispense Refill   alfuzosin  (UROXATRAL ) 10 MG 24 hr tablet Take 1 tablet (10 mg total) by mouth every morning. 90 tablet 3   bisacodyl (DULCOLAX) 10 MG suppository Place 10 mg rectally daily as needed (constipation - no relief from MoM).     carvedilol  (COREG ) 12.5  MG tablet Take 1 tablet (12.5 mg total) by mouth in the morning and at bedtime. 180 tablet 3   cholecalciferol  (VITAMIN D3) 25 MCG (1000 UNIT) tablet Take 1 tablet (1,000 Units total) by mouth daily. 90 tablet 1   cycloSPORINE  (RESTASIS ) 0.05 % ophthalmic emulsion Place 1 drop into both eyes 2 (two) times daily. 60 each 2   donepezil  (ARICEPT ) 10 MG tablet Take 1 tablet (10 mg total) by mouth daily. 30 tablet 0   ELIQUIS  5 MG TABS tablet TAKE 1 TABLET(5 MG) BY MOUTH TWICE DAILY 180 tablet 1   fluticasone  (FLONASE ) 50 MCG/ACT nasal spray Place 2 sprays into both nostrils daily. 16 g 6   furosemide  (LASIX ) 40 MG tablet Take 1 tablet (40 mg total) by mouth daily. 90 tablet 3   latanoprost  (XALATAN ) 0.005 % ophthalmic solution Place 1 drop into both eyes at bedtime. 2.5 mL 12   magnesium hydroxide (MILK OF MAGNESIA) 400 MG/5ML suspension Take 30 mLs by mouth daily as needed (no BM in 3 days).     melatonin 3 MG TABS tablet Take 1 tablet (3 mg total) by mouth at bedtime as needed.     mirabegron  ER (MYRBETRIQ ) 50 MG TB24 tablet Take 1 tablet (50 mg total) by mouth every morning. 90 tablet 3   Multiple Vitamin (MULTIVITAMIN WITH MINERALS) TABS tablet Take 1 tablet by mouth daily.     Omega-3 1000 MG CAPS Take 1,000 mg by mouth daily.     Polyethyl Glycol-Propyl Glycol (LUBRICANT  EYE DROPS) 0.4-0.3 % SOLN Place 1 drop into both eyes every 8 (eight) hours as needed (dry, irritated eyes).     rosuvastatin  (CRESTOR ) 20 MG tablet Take 1 tablet (20 mg total) by mouth daily. 30 tablet 3   sacubitril -valsartan  (ENTRESTO ) 49-51 MG Take 1 tablet by mouth 2 (two) times daily. 60 tablet 3   Sodium Phosphates (ENEMA) ENEM Place 1 enema rectally daily as needed (constipation - no relief from bisacodyl suppository).     traZODone  (DESYREL ) 50 MG tablet Take 1 tablet (50 mg total) by mouth at bedtime as needed for sleep.     No facility-administered medications prior to visit.    PAST MEDICAL HISTORY: Past Medical  History:  Diagnosis Date   1st degree AV block    Atrial fibrillation (HCC)    Diastolic dysfunction 4/13   grade 1   Hypertension    New onset atrial fibrillation (HCC) 06/2015   RBBB    Sinus bradycardia    Stroke (HCC)    no defecits, pt denies having a stroke, says Dr Burnard diagnosed this    PAST SURGICAL HISTORY: Past Surgical History:  Procedure Laterality Date   BIV PACEMAKER INSERTION CRT-P N/A 01/26/2022   Procedure: BIV PACEMAKER INSERTION CRT-P;  Surgeon: Waddell Danelle ORN, MD;  Location: [facility] INVASIVE CV LAB;  Service: Cardiovascular;  Laterality: N/A;   COLONOSCOPY     COLONOSCOPY  12/30/2011   Procedure: COLONOSCOPY;  Surgeon: Lamar CHRISTELLA Hollingshead, MD;  Location: [facility];  Service: Endoscopy;  Laterality: N/A;  1:45PM   COLONOSCOPY N/A 04/19/2019   Procedure: COLONOSCOPY;  Surgeon: Hollingshead Lamar CHRISTELLA, MD;  Location: [facility];  Service: Endoscopy;  Laterality: N/A;  2:00pm   EXCISION MASS LOWER EXTREMETIES Left 05/12/2018   Procedure: Left foot plantar fibroma excision;  Surgeon: Kit Rush, MD;  Location: [facility];  Service: Orthopedics;  Laterality: Left;   FOOT SURGERY Left    LEFT HEART CATH AND CORONARY ANGIOGRAPHY N/A 01/26/2022   Procedure: LEFT HEART CATH AND CORONARY ANGIOGRAPHY;  Surgeon: Burnard Debby LABOR, MD;  Location: [facility] INVASIVE CV LAB;  Service: Cardiovascular;  Laterality: N/A;   SPERMATOCELECTOMY Right 11/13/2019   Procedure: EXCISION OF SPERMATIC CORD CYST;  Surgeon: Sherrilee Belvie CROME, MD;  Location: [facility];  Service: Urology;  Laterality: Right;   VASECTOMY  11/13/2019   Procedure: VASECTOMY;  Surgeon: Sherrilee Belvie CROME, MD;  Location: [facility];  Service: Urology;;    FAMILY HISTORY: Family History  Problem Relation Age of Onset   Cancer Brother    CVA Mother    Hypertension Father    Colon cancer Neg Hx     SOCIAL HISTORY: Social History   Socioeconomic History   Marital status: Married    Spouse name: Not on file   Number  of children: 5   Years of education: Not on file   Highest education level: Not on file  Occupational History   Not on file  Tobacco Use   Smoking status: Former    Current packs/day: 0.00    Types: Cigarettes    Quit date: 1996    Years since quitting: 30.0   Smokeless tobacco: Never   Tobacco comments:    quit about 40 yrs ago  Vaping Use   Vaping status: Never Used  Substance and Sexual Activity   Alcohol  use: Not Currently    Comment: 6 pack of beer or less in a week   Drug use: No  Sexual activity: Not on file  Other Topics Concern   Not on file  Social History Narrative   Patient lives with wife, does not currently have home health.    Patient is retired.    Social Drivers of Health   Tobacco Use: Medium Risk (07/25/2024)   Patient History    Smoking Tobacco Use: Former    Smokeless Tobacco Use: Never    Passive Exposure: Not on file  Financial Resource Strain: Low Risk (04/12/2023)   Overall Financial Resource Strain (CARDIA)    Difficulty of Paying Living Expenses: Not hard at all  Food Insecurity: No Food Insecurity (07/17/2024)   Epic    Worried About Programme Researcher, Broadcasting/film/video in the Last Year: Never true    Ran Out of Food in the Last Year: Never true  Transportation Needs: No Transportation Needs (07/17/2024)   Epic    Lack of Transportation (Medical): No    Lack of Transportation (Non-Medical): No  Physical Activity: Inactive (04/12/2023)   Exercise Vital Sign    Days of Exercise per Week: 0 days    Minutes of Exercise per Session: 0 min  Stress: No Stress Concern Present (07/17/2024)   [employer] of Occupational Health - Occupational Stress Questionnaire    Feeling of Stress: Not at all  Social Connections: Moderately Isolated (07/17/2024)   Social Connection and Isolation Panel    Frequency of Communication with Friends and Family: More than three times a week    Frequency of Social Gatherings with Friends and Family: More than three times a week     Attends Religious Services: Patient declined    Active Member of Clubs or Organizations: No    Attends Banker Meetings: Never    Marital Status: Married  Catering Manager Violence: Not At Risk (07/17/2024)   Epic    Fear of Current or Ex-Partner: No    Emotionally Abused: No    Physically Abused: No    Sexually Abused: No  Depression (PHQ2-9): Low Risk (07/17/2024)   Depression (PHQ2-9)    PHQ-2 Score: 0  Recent Concern: Depression (PHQ2-9) - High Risk (05/16/2024)   Depression (PHQ2-9)    PHQ-2 Score: 14  Alcohol  Screen: Low Risk (04/12/2023)   Alcohol  Screen    Last Alcohol  Screening Score (AUDIT): 0  Housing: Low Risk (07/17/2024)   Epic    Unable to Pay for Housing in the Last Year: No    Number of Times Moved in the Last Year: 0    Homeless in the Last Year: No  Utilities: Not At Risk (07/17/2024)   Epic    Threatened with loss of utilities: No  Health Literacy: Patient Unable To Answer (07/17/2024)   B1300 Health Literacy    Frequency of need for help with medical instructions: Patient unable to respond     PHYSICAL EXAM  GENERAL EXAM/CONSTITUTIONAL: Vitals:  Vitals:   07/25/24 1519  BP: (!) 142/80  Pulse: (!) 59  SpO2: 97%  Weight: 221 lb (100.2 kg)  Height: 5' 10 (1.778 m)   Body mass index is 31.71 kg/m. Wt Readings from Last 3 Encounters:  07/25/24 221 lb (100.2 kg)  07/17/24 224 lb (101.6 kg)  06/23/24 224 lb 6.4 oz (101.8 kg)   Patient is in no distress; well developed, nourished and groomed; neck is supple  CARDIOVASCULAR: Examination of carotid arteries is normal; no carotid bruits Regular rate and rhythm, no murmurs Examination of peripheral vascular system by observation and palpation is normal  EYES: Ophthalmoscopic exam of optic discs and posterior segments is normal; no papilledema or hemorrhages No results found.  MUSCULOSKELETAL: Gait, strength, tone, movements noted in Neurologic exam below  NEUROLOGIC: MENTAL STATUS:      07/25/2024    3:31 PM  MMSE - Mini Mental State Exam  Orientation to time 0  Orientation to Place 5  Registration 3  Attention/ Calculation 1  Recall 1  Language- name 2 objects 2  Language- repeat 1  Language- follow 3 step command 3  Language- read & follow direction 1  Write a sentence 1  Copy design 1  Total score 19   awake, alert, oriented to person, place [facility] memory  DECR attention and concentration language fluent, comprehension intact, naming intact fund of knowledge appropriate  CRANIAL NERVE:  2nd - no papilledema on fundoscopic exam 2nd, 3rd, 4th, 6th - pupils equal and reactive to light, visual fields full to confrontation, extraocular muscles intact, no nystagmus 5th - facial sensation symmetric 7th - facial strength --> DECR LEFT LOWER FACIAL STRENGTH 8th - hearing intact 9th - palate elevates symmetrically, uvula midline 11th - shoulder shrug symmetric 12th - tongue protrusion midline HOARSE, SOFT VOICE  MOTOR:  normal bulk and tone, full strength in the BUE, BLE; SLOW MOVEMENTS IN BUE AND BLE  SENSORY:  normal and symmetric to light touch, temperature, vibration  COORDINATION:  finger-nose-finger, fine finger movements normal  REFLEXES:  deep tendon reflexes 1+ and symmetric  GAIT/STATION:  narrow based gait; SLOW TO RISE; USING WALKER     DIAGNOSTIC DATA (LABS, IMAGING, TESTING) - I reviewed patient records, labs, notes, testing and imaging myself where available.  Lab Results  Component Value Date   WBC 6.3 04/18/2024   HGB 11.4 (L) 04/18/2024   HCT 36.7 (L) 04/18/2024   MCV 70.3 (L) 04/18/2024   PLT 200 04/18/2024      Component Value Date/Time   NA 141 05/16/2024 1149   K 4.4 05/16/2024 1149   K 4.2 10/22/2011 1131   CL 106 05/16/2024 1149   CL 106 10/22/2011 1131   CO2 23 05/16/2024 1149   CO2 27 10/22/2011 1131   GLUCOSE 103 (H) 05/16/2024 1149   GLUCOSE 101 (H) 04/18/2024 0241   BUN 18 05/16/2024 1149   CREATININE 1.27  05/16/2024 1149   CREATININE 1.59 (H) 01/19/2024 0930   CALCIUM  9.4 05/16/2024 1149   CALCIUM  8.9 10/22/2011 1131   PROT 6.3 (L) 04/15/2024 1530   PROT 6.8 02/25/2023 1135   ALBUMIN 3.3 (L) 04/15/2024 1530   ALBUMIN 4.2 02/25/2023 1135   AST 27 04/15/2024 1530   AST 24 09/11/2011 1134   ALT 15 04/15/2024 1530   ALKPHOS 76 04/15/2024 1530   ALKPHOS 97 09/11/2011 1134   BILITOT 0.9 04/15/2024 1530   BILITOT 1.0 02/25/2023 1135   BILITOT 0.4 09/11/2011 1134   GFRNONAA 48 (L) 04/18/2024 0241   GFRAA >60 11/06/2019 1924   Lab Results  Component Value Date   CHOL 139 04/07/2024   HDL 41 04/07/2024   LDLCALC 81 04/07/2024   TRIG 86 04/07/2024   CHOLHDL 3.4 04/07/2024   Lab Results  Component Value Date   HGBA1C 6.0 (H) 03/16/2024   Lab Results  Component Value Date   VITAMINB12 584 03/16/2024   Lab Results  Component Value Date   TSH 1.36 01/19/2024    02/13/21 MRI brain 1. Compared with 2012 MRI, there has been extensive progression of chronic microvascular ischemic changes. There has been extensive  progression of numerous areas of chronic microhemorrhage. Pattern most consistent with chronic microvascular ischemic change and poorly controlled hypertension. Correlate with clinical risk factors. 2. No acute infarct or hydrocephalus.  04/22/23 CT head  1. No acute intracranial hemorrhage or infarct. 2. Multiple remote lacunar infarcts as outlined above. 3. Mild senescent change.  04/07/24 MRI brain 1. Intermittently motion degraded examination. 2. No evidence of an acute intracranial abnormality. The diffusion-weighted imaging is of good quality and there is no evidence of an acute infarct. 3. Parenchymal atrophy and advanced chronic small vessel ischemic disease (with numerous chronic lacunar infarcts and chronic microhemorrhages) as described.    ASSESSMENT AND PLAN  79 y.o. year old male here with:   Dx:  No diagnosis found.   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; since ~2022; decline in ADLs) - continue donepezil ; may consider adding on memantine - safety / supervision issues reviewed - daily physical activity / exercise (at least 15-30 minutes) - eat more plants / vegetables - increase social activities, brain stimulation, games, puzzles, hobbies, crafts, arts, music - aim for at least 7-8 hours sleep per night (or more) - avoid smoking and alcohol  - caution with medications, finances; no driving - encouraged patient's wife to seek assistance (home health aids), [facility] [place] [facility], [facility], [email or web address]   Return for return to PCP, pending if symptoms worsen or fail to improve.    EDUARD FABIENE HANLON, MD 07/25/2024, 4:20 PM Certified in Neurology, Neurophysiology and Neuroimaging  [facility] [address], [address] [place], [place] [address] [phone]  "

## 2024-08-03 ENCOUNTER — Ambulatory Visit: Attending: Internal Medicine

## 2024-08-03 DIAGNOSIS — I428 Other cardiomyopathies: Secondary | ICD-10-CM

## 2024-08-03 LAB — CUP PACEART REMOTE DEVICE CHECK
Battery Remaining Longevity: 52 mo
Battery Remaining Percentage: 64 %
Battery Voltage: 2.99 V
Brady Statistic AP VP Percent: 63 %
Brady Statistic AP VS Percent: 1 %
Brady Statistic AS VP Percent: 34 %
Brady Statistic AS VS Percent: 1 %
Brady Statistic RA Percent Paced: 61 %
Date Time Interrogation Session: 20260122040011
Implantable Lead Connection Status: 753985
Implantable Lead Connection Status: 753985
Implantable Lead Connection Status: 753985
Implantable Lead Implant Date: 20230717
Implantable Lead Implant Date: 20230717
Implantable Lead Implant Date: 20230717
Implantable Lead Location: 753858
Implantable Lead Location: 753859
Implantable Lead Location: 753860
Implantable Pulse Generator Implant Date: 20230717
Lead Channel Impedance Value: 360 Ohm
Lead Channel Impedance Value: 440 Ohm
Lead Channel Impedance Value: 460 Ohm
Lead Channel Pacing Threshold Amplitude: 0.75 V
Lead Channel Pacing Threshold Amplitude: 0.75 V
Lead Channel Pacing Threshold Amplitude: 1.25 V
Lead Channel Pacing Threshold Pulse Width: 0.5 ms
Lead Channel Pacing Threshold Pulse Width: 0.5 ms
Lead Channel Pacing Threshold Pulse Width: 0.5 ms
Lead Channel Sensing Intrinsic Amplitude: 12 mV
Lead Channel Sensing Intrinsic Amplitude: 3.5 mV
Lead Channel Setting Pacing Amplitude: 1.75 V
Lead Channel Setting Pacing Amplitude: 2.25 V
Lead Channel Setting Pacing Amplitude: 2.5 V
Lead Channel Setting Pacing Pulse Width: 0.5 ms
Lead Channel Setting Pacing Pulse Width: 0.5 ms
Lead Channel Setting Sensing Sensitivity: 2 mV
Pulse Gen Model: 3562
Pulse Gen Serial Number: 8094736

## 2024-08-05 NOTE — Progress Notes (Signed)
 Remote PPM Transmission

## 2024-08-12 ENCOUNTER — Ambulatory Visit: Payer: Self-pay | Admitting: Student in an Organized Health Care Education/Training Program

## 2024-08-25 ENCOUNTER — Ambulatory Visit: Admitting: Student in an Organized Health Care Education/Training Program

## 2024-08-30 ENCOUNTER — Ambulatory Visit

## 2024-10-04 ENCOUNTER — Ambulatory Visit: Admitting: Urology

## 2024-10-24 ENCOUNTER — Ambulatory Visit: Payer: Medicare PPO

## 2024-11-02 ENCOUNTER — Ambulatory Visit

## 2025-01-23 ENCOUNTER — Ambulatory Visit: Payer: Medicare PPO

## 2025-07-18 ENCOUNTER — Ambulatory Visit: Payer: Self-pay
